# Patient Record
Sex: Female | Born: 1945
Health system: Southern US, Community
[De-identification: ages and names within clinical notes are randomized; demographics above are authoritative.]

## PROBLEM LIST (undated history)

## (undated) DIAGNOSIS — J42 Unspecified chronic bronchitis: Secondary | ICD-10-CM

## (undated) DIAGNOSIS — F32A Depression, unspecified: Secondary | ICD-10-CM

## (undated) DIAGNOSIS — N189 Chronic kidney disease, unspecified: Secondary | ICD-10-CM

## (undated) DIAGNOSIS — E039 Hypothyroidism, unspecified: Secondary | ICD-10-CM

## (undated) DIAGNOSIS — E119 Type 2 diabetes mellitus without complications: Secondary | ICD-10-CM

## (undated) DIAGNOSIS — I499 Cardiac arrhythmia, unspecified: Secondary | ICD-10-CM

## (undated) DIAGNOSIS — E785 Hyperlipidemia, unspecified: Secondary | ICD-10-CM

## (undated) DIAGNOSIS — M1990 Unspecified osteoarthritis, unspecified site: Secondary | ICD-10-CM

## (undated) DIAGNOSIS — I739 Peripheral vascular disease, unspecified: Secondary | ICD-10-CM

## (undated) DIAGNOSIS — R0602 Shortness of breath: Secondary | ICD-10-CM

## (undated) DIAGNOSIS — L0100 Impetigo, unspecified: Secondary | ICD-10-CM

## (undated) DIAGNOSIS — J45909 Unspecified asthma, uncomplicated: Secondary | ICD-10-CM

## (undated) DIAGNOSIS — G709 Myoneural disorder, unspecified: Secondary | ICD-10-CM

## (undated) DIAGNOSIS — E079 Disorder of thyroid, unspecified: Secondary | ICD-10-CM

## (undated) DIAGNOSIS — M810 Age-related osteoporosis without current pathological fracture: Secondary | ICD-10-CM

## (undated) DIAGNOSIS — T4145XA Adverse effect of unspecified anesthetic, initial encounter: Secondary | ICD-10-CM

## (undated) DIAGNOSIS — I4891 Unspecified atrial fibrillation: Secondary | ICD-10-CM

## (undated) DIAGNOSIS — I10 Essential (primary) hypertension: Secondary | ICD-10-CM

## (undated) DIAGNOSIS — D649 Anemia, unspecified: Secondary | ICD-10-CM

## (undated) DIAGNOSIS — J449 Chronic obstructive pulmonary disease, unspecified: Secondary | ICD-10-CM

## (undated) HISTORY — DX: Age-related osteoporosis without current pathological fracture: M81.0

## (undated) HISTORY — DX: Essential (primary) hypertension: I10

## (undated) HISTORY — PX: OTHER SURGICAL HISTORY: SHX169

## (undated) HISTORY — DX: Peripheral vascular disease, unspecified: I73.9

## (undated) HISTORY — DX: Unspecified asthma, uncomplicated: J45.909

## (undated) HISTORY — PX: CHOLECYSTECTOMY: SHX55

## (undated) HISTORY — DX: Type 2 diabetes mellitus without complications: E11.9

## (undated) HISTORY — DX: Disorder of thyroid, unspecified: E07.9

## (undated) HISTORY — DX: Hyperlipidemia, unspecified: E78.5

## (undated) HISTORY — PX: HIP FRACTURE SURGERY: SHX118

## (undated) HISTORY — PX: CATARACT EXTRACTION, BILATERAL: SHX1313

---

## 1998-06-27 DIAGNOSIS — T8859XA Other complications of anesthesia, initial encounter: Secondary | ICD-10-CM

## 1998-06-27 HISTORY — DX: Other complications of anesthesia, initial encounter: T88.59XA

## 1998-07-13 ENCOUNTER — Other Ambulatory Visit: Admission: RE | Admit: 1998-07-13 | Discharge: 1998-07-13 | Payer: Self-pay | Admitting: *Deleted

## 1999-07-15 ENCOUNTER — Other Ambulatory Visit: Admission: RE | Admit: 1999-07-15 | Discharge: 1999-07-15 | Payer: Self-pay | Admitting: *Deleted

## 2000-03-31 ENCOUNTER — Ambulatory Visit (HOSPITAL_COMMUNITY): Admission: RE | Admit: 2000-03-31 | Discharge: 2000-03-31 | Payer: Self-pay | Admitting: *Deleted

## 2003-01-07 ENCOUNTER — Encounter: Payer: Self-pay | Admitting: Emergency Medicine

## 2003-01-07 ENCOUNTER — Emergency Department (HOSPITAL_COMMUNITY): Admission: EM | Admit: 2003-01-07 | Discharge: 2003-01-07 | Payer: Self-pay | Admitting: Emergency Medicine

## 2003-12-03 ENCOUNTER — Inpatient Hospital Stay (HOSPITAL_COMMUNITY): Admission: EM | Admit: 2003-12-03 | Discharge: 2003-12-11 | Payer: Self-pay | Admitting: Emergency Medicine

## 2006-08-14 ENCOUNTER — Emergency Department (HOSPITAL_COMMUNITY): Admission: EM | Admit: 2006-08-14 | Discharge: 2006-08-14 | Payer: Self-pay | Admitting: Family Medicine

## 2007-08-26 ENCOUNTER — Emergency Department (HOSPITAL_COMMUNITY): Admission: EM | Admit: 2007-08-26 | Discharge: 2007-08-26 | Payer: Self-pay | Admitting: Family Medicine

## 2008-06-05 ENCOUNTER — Other Ambulatory Visit: Admission: RE | Admit: 2008-06-05 | Discharge: 2008-06-05 | Payer: Self-pay | Admitting: Internal Medicine

## 2008-06-10 ENCOUNTER — Encounter: Admission: RE | Admit: 2008-06-10 | Discharge: 2008-06-10 | Payer: Self-pay | Admitting: Internal Medicine

## 2010-01-05 ENCOUNTER — Encounter: Admission: RE | Admit: 2010-01-05 | Discharge: 2010-01-05 | Payer: Self-pay | Admitting: Internal Medicine

## 2010-01-29 ENCOUNTER — Emergency Department (HOSPITAL_COMMUNITY): Admission: EM | Admit: 2010-01-29 | Discharge: 2010-01-29 | Payer: Self-pay | Admitting: Emergency Medicine

## 2010-07-18 ENCOUNTER — Encounter: Payer: Self-pay | Admitting: Internal Medicine

## 2010-09-13 ENCOUNTER — Other Ambulatory Visit: Payer: Self-pay | Admitting: Internal Medicine

## 2010-09-13 DIAGNOSIS — Z1231 Encounter for screening mammogram for malignant neoplasm of breast: Secondary | ICD-10-CM

## 2010-09-16 ENCOUNTER — Ambulatory Visit
Admission: RE | Admit: 2010-09-16 | Discharge: 2010-09-16 | Disposition: A | Discharge status: Home / Self Care | Payer: Medicare Other | Source: Ambulatory Visit | Attending: Internal Medicine | Admitting: Internal Medicine

## 2010-09-16 DIAGNOSIS — Z1231 Encounter for screening mammogram for malignant neoplasm of breast: Secondary | ICD-10-CM

## 2010-11-12 NOTE — Discharge Summary (Signed)
NAME:  Ariel Cobb, Ariel Cobb                        ACCOUNT NO.:  [id number]   MEDICAL RECORD NO.:  [id number]                   PATIENT TYPE:  INP   LOCATION:  5012                                 FACILITY:  [facility]   PHYSICIAN:  Vania Rea. Supple, M.D.               DATE OF BIRTH:  12/01/1945   DATE OF ADMISSION:  12/03/2003  DATE OF DISCHARGE:  12/11/2003                                 DISCHARGE SUMMARY   ADMISSION DIAGNOSES:  1. Displaced femoral neck fracture.  2. Non-insulin-dependent diabetes.  3. Hypertension.  4. Tobacco dependency.   DISCHARGE DIAGNOSES:  1. Displaced femoral neck fracture.  2. Non-insulin-dependent diabetes mellitus.  3. Hypertension.  4. Tobacco dependency.  5. Status post left hip hemiarthroplasty.   OPERATION:  Long stem S-ROM press-fit bipolar hemiarthroplasty.   SURGEON:  Vania Rea. Supple, M.D.   ASSISTANTSMadlyn Frankel. Charlann Boxer, M.D., and Lucita Lora. Shuford, P.A.-C.   ANESTHESIA:  General.   BRIEF HISTORY:  [name] is a 65 year old female who slipped and fell in  her driveway on some slippery rocks.  She denied syncope or chest pain.  She  denied any loss of consciousness.  She had the sudden onset of left hip  pain, was unable to ambulate.  She was brought to [facility]  Emergency Room where x-rays demonstrated a displaced left hip fracture about  the femoral neck.  Operative intervention was discussed, the risks and  benefits thereof, at length, and she wished to proceed.   HOSPITAL COURSE:  The patient was admitted, underwent the above-named  procedure.  Intraoperatively, she was found to have a cortical violation  after the initial implantation of a cemented stem.  Therefore, the cement  was removed and placement of a long stem press-fit prosthesis was placed.  She tolerated this patient well.  All appropriate IV antibiotics and  infusions were utilized.  She did experience a blood loss anemia and was  transfused with good result.   She had a mild hyponatremia postoperatively  which responded to fluid management.  Postoperatively, she remained  hemodynamically and medically stable.  She had the expected amount of pain,  was weaned off IV analgesics to p.o.'s.  The rehab consult was ordered as  she was initially slow to progress.  However, after pain control was  managed, progressive mobilization was tolerated and, by December 11, 2003, she  had met all discharge goals.  At this time, she was afebrile.  Neurovascularly, she was intact, her dressing was dry, and she was  tolerating short-distance ambulation and transfers. At this time, she was  felt medically and orthopedically stable and discharged to home to follow up  in an outpatient setting.   LABORATORY DATA:  Admission EKG showed normal sinus rhythm.  Initial hip  films showed a femoral neck fracture on the left.  Initial chest film showed  possible pulmonary hyperinflation but no active disease.  Intraoperative  films taken demonstrated cortical violation.  Postoperative films showed a  long hip prosthesis and cerclage wire placement.  On June 11, repeat chest  showed streaky bibasilar atelectasis.   Admission labs shows admission hemoglobin 11.5.  She was transfused  intraoperatively and remained stable postoperatively between 10.9 and 8.8.  Coagulation times were monitored by pharmacy on DVT prophylaxis with  Coumadin.  Chemistries showed an admission sodium 135, postoperatively down  to 127, and prior to discharge, 132.  Glucoses ranged between 151-303.  Blood type O negative.   CONDITION ON DISCHARGE:  Stable and improved.   DISCHARGE MEDICATIONS AND PLANS:  The patient is being discharged to home.  She will follow up with [place] in two weeks, call for a time.  She is discharged  with the following prescriptions:  Coumadin for deep venous thrombosis and  pulmonary embolus prophylaxis, Percocet, Robaxin, and Restoril.  May shower  at this time.  She is  touch-down weightbearing, total hip precautions.  Would like to follow up in 7-10 days for repeat x-rays in our office.  May  resume regular diet at home.   CONDITION ON DISCHARGE:  Stable, improved.      Tracy A. Shuford, P.A.-C.                 Vania Rea. Supple, M.D.    TAS/MEDQ  D:  01/16/2004  T:  01/17/2004  Job:  [id number]

## 2010-11-12 NOTE — Op Note (Signed)
NAME:  Ariel Cobb, Ariel Cobb                        ACCOUNT NO.:  [id number]   MEDICAL RECORD NO.:  [id number]                   PATIENT TYPE:  INP   LOCATION:  5504                                 FACILITY:  [facility]   PHYSICIAN:  Vania Rea. Supple, M.D.               DATE OF BIRTH:  [birth year]   DATE OF PROCEDURE:  12/04/2003  DATE OF DISCHARGE:                                 OPERATIVE REPORT   PREOPERATIVE DIAGNOSIS:  Displaced left femoral neck fracture.   POSTOPERATIVE DIAGNOSIS:  Displaced left femoral neck fracture.   OPERATION PERFORMED:  Press-fit SROM bipolar hemiarthroplasty utilizing a  long stemmed femoral implant 225 mm length and a 46 mm outside diameter  bipolar head, 28 mm inside head with +0 neck length and a 20 V large  proximal sleeve.   SURGEON:  Vania Rea. Supple, M.D.   ASSISTANT:  1. Madlyn Frankel. Charlann Boxer, M.D.  2. French Ana A. Shuford, P.A.-C.   ANESTHESIA:  General endotracheal.   ESTIMATED BLOOD LOSS:  500 mL.   DRAINS:  None.   FLUIDS REPLACED:  2 L crystalloid, 1 unit packed cells.   INDICATIONS FOR PROCEDURE:  Ariel Cobb is a 65 year old female who fell at  home yesterday sustaining a displaced left femoral neck fracture.  She has  been admitted to the hospital and is brought to the operating room at this  time for planned left hip hemiarthroplasty.  Preoperatively, I discussed  with Ariel Cobb and her family members treatment options as well as risks  versus benefits thereof.  Possible surgical complications of bleeding,  infection, neurovascular injury, DVT, PE, persistent pain, loss of motion,  failure of the implant and possible need for revision surgery were reviewed.  She understands and accepts and agreed with our planned procedure.   DESCRIPTION OF PROCEDURE:  After undergoing routine preop evaluation, the  patient received prophylactic antibiotics.  Brought to the operating room,  placed supine on the operating table and given induction of general  endotracheal anesthesia.  Turned to the right lateral decubitus position on  the bed and appropriately stabilized with the Stullberg hip stabilizer.  The  left hip girdle region was then sterilely prepped and draped in standard  fashion.  She did receive prophylactic antibiotics which was then  supplemented with an additional dose towards the end of the case.  Once the  left hip girdle region was sterilely prepped and draped in standard fashion,  we made a standard posterior approach to the hip through a 15 cm incision  centered over the greater trochanter.  Skin flaps were elevated and  electrocautery was used for hemostasis.  The deep fascia was then divided in  line with the skin incision and the gluteus maximus was split proximally.  Charnley retractor was placed.  The short external rotators including the  piriformis were divided away from their attachment to the greater trochanter  and tagged and reflected posteriorly  and the gluteus medius and minimus were  reflected anteriorly.  The capsule was identified, T'd and tagged.  This  allowed visualization of the fracture site.  A corkscrew was then used  to  remove the femoral head.  It was measured at approximately 47 mm.  The  acetabulum was inspected and the articular surfaces were in good condition.  Attention was then directed to the proximal femur where residual soft tissue  at the piriformis fossa was removed with electrocautery.  Guide pin was then  directed into the femoral shaft through the piriformis fossa followed by a  step drill.  Canal finder was passed.  Sequential reamings were performed up  to size 8.  We then performed sequential broachings after we had utilized a  trochanteric reamer.  Broaches were performed up to size 8 and off of this  we used a calcar planer to smooth the contour of the femoral neck cut.  I  should mention that we did make a femoral neck cut with an oscillating saw  off the size 8 reamer with the  appropriate cutting guide.  After this was  completed, then we measured the distal cement plug and placed this into  position at the proper depth.  Cement was then mixed at the back table and  in the process of introducing the cement, approximately a third of the  cement was introduced and then technical difficulties were encountered in  further delivering the cement into the canal.  At this point we elected to  remove the already placed cement and had carefully inspected the canal and  it appeared as though all the cement had been removed.  Another batch of  cement was mixed and this was then introduced and the implant was then  placed in the canal but it was clear that the implant would not completely  seat and it appeared as though there had been some residual cement  preventing complete delivery of the prosthesis.  In the process of  delivering the prosthesis and impacting it, an audible crack was  appreciated.  With this in mind the implant was then retrieved.  We again  removed all obvious cement and on further inspection, it was clear that the  distal cement plug had retained some cement and was preventing the implant  from properly seating.  At this point attempts were initiated to retrieve  the retained cement in the canal to allow complete implantation of the  prosthesis.  In the process of retrieving the retained cement, a further  violation of the cortical bone was developed posterolaterally.  With this in  mind, it was clear that removing the cement simply through the initial  incision would be technically difficult if not impossible and we elected to  extend the incision distally down the lateral shaft of the femur.  This was  performed with sharp dissection with tensor fascia lata divided along the  skin margin, along the direction of the skin incision and then the vastus  lateralis fascia was split and the vastus lateralis musculature was then divided away from the fascia  and reflected anteriorly allowing complete  exposure of the lateral cortex of the femur extending approximately 25 cm  from the femoral neck cut.  This allowed visualization of the cortical  violation zone,  through this window, we were able to use an osteotome to  remove retained cement from within the medullary canal.  In addition, there  was a small longitudinal split of  the cortex, extending somewhat distally  from the zone of cortical violation and we did utilize 18 gauge stainless  steel wire doubled over and placed around the femur in cerclage fashion to  limit hoop stresses on the femur as we were cleaning the canal and  performing additional reamings.  Ultimately all of the cement was removed  successfully and the canal was then pulsatilely lavaged.  At this point we  switched from the [facility] system to the [place] system such that we could  utilize a long stem SROM to bypass the cortical defect.  Sequential reamings  were performed up to 18 mm.  Trial implants were then placed and the 225 cm  straight long stem nicely passed the cortical window providing at least 4 cm  of distal purchase.  Proper version was established proximally and the 48,  47 and 46 mm heads were then once again trialed and then at this point with  the Depuy system, the 46 mm head had better stability and fit.  Trial  reduction was performed showing clinically equal leg lengths.  At this point  a final irrigation of the canal was then achieved.  We put a total of three  cerclage wires around the femur, one proximal and two distal to the cortical  window.  The final implant was then placed beginning with the proximal  sleeve for the calcar and then the long stem plant which was impacted into  position.  The final head construct was then placed into position and the  hip was reduced and taken through a range of motion showing good soft tissue  balance and clinically equal leg lengths and good stability.  At this  point  the wound was then copiously irrigated.  Hemostasis was obtained.  The  fascia over the vastus lateralis was closed with a running #1 Vicryl.  Interrupted figure-of-eight #1 Vicryl sutures were used to close the tensor  fascia lata.  The capsule was closed  with a #1 Ethibond and the short  external rotators and piriformis were then repaired with a greater  trochanter with a #1 Ethibond.  Deep fascial closure was then completed with  a #1 Vicryl and 2-0 Vicryl was used in the  subcutaneous layer and intracuticular 3-0 Monocryl used to close the skin  followed by Steri-Strips.  Bulky dry dressing was applied.  The patient was  then rolled supine, extubated and taken to recovery room where she was found  to be stable and neurovascularly intact in the left lower extremity.                                               Vania Rea. Supple, M.D.    KMS/MEDQ  D:  12/04/2003  T:  12/05/2003  Job:  [id number]

## 2010-11-12 NOTE — Procedures (Signed)
[facility]  Patient:    JEYDI, KLINGEL                     MRN: [id number] Proc. Date: 03/31/00 Adm. Date:  [id number] Attending:  Sabino Gasser                           Procedure Report  PROCEDURE:  Colonoscopy.  INDICATION FOR PROCEDURE:  Hemoccult positivity.  ANESTHESIA:  Demerol 50 mg, Versed 7 mg.  DESCRIPTION OF PROCEDURE:  With the patient mildly sedated in the left lateral decubitus position, the Olympus videoscopic colonoscope was inserted in the rectum and passed under direct vision to the cecum. The cecum was identified by the ileocecal valve and appendiceal orifice both of which were photographed. From this point, the colonoscope was slowly withdrawn taking circumferential views of the entire colonic mucosa, stopping only in the rectum which appeared normal in direct view and showed small internal hemorrhoids on retroflexed view. The endoscope was straightened and withdrawn. The patients vital signs and pulse oximeter remained stable. The patient tolerated the procedure well without apparent complications.  FINDINGS:  Small internal hemorrhoids otherwise unremarkable colonoscopic examination to the cecum.  PLAN:  Repeat examination possibly in 5-10 years. DD:  03/31/00 TD:  03/31/00 Job: [id number]

## 2011-08-05 DIAGNOSIS — B351 Tinea unguium: Secondary | ICD-10-CM | POA: Diagnosis not present

## 2011-08-05 DIAGNOSIS — M79609 Pain in unspecified limb: Secondary | ICD-10-CM | POA: Diagnosis not present

## 2011-08-05 DIAGNOSIS — S90129A Contusion of unspecified lesser toe(s) without damage to nail, initial encounter: Secondary | ICD-10-CM | POA: Diagnosis not present

## 2011-09-14 DIAGNOSIS — N182 Chronic kidney disease, stage 2 (mild): Secondary | ICD-10-CM | POA: Diagnosis not present

## 2011-09-14 DIAGNOSIS — Z0000 Encounter for general adult medical examination without abnormal findings: Secondary | ICD-10-CM | POA: Diagnosis not present

## 2011-09-14 DIAGNOSIS — E1129 Type 2 diabetes mellitus with other diabetic kidney complication: Secondary | ICD-10-CM | POA: Diagnosis not present

## 2011-09-14 DIAGNOSIS — F17200 Nicotine dependence, unspecified, uncomplicated: Secondary | ICD-10-CM | POA: Diagnosis not present

## 2011-09-14 DIAGNOSIS — I10 Essential (primary) hypertension: Secondary | ICD-10-CM | POA: Diagnosis not present

## 2011-09-14 DIAGNOSIS — E782 Mixed hyperlipidemia: Secondary | ICD-10-CM | POA: Diagnosis not present

## 2011-09-27 ENCOUNTER — Other Ambulatory Visit: Payer: Self-pay | Admitting: Internal Medicine

## 2011-09-27 DIAGNOSIS — Z1231 Encounter for screening mammogram for malignant neoplasm of breast: Secondary | ICD-10-CM

## 2011-10-05 ENCOUNTER — Ambulatory Visit
Admission: RE | Admit: 2011-10-05 | Discharge: 2011-10-05 | Disposition: A | Discharge status: Home / Self Care | Payer: Medicare Other | Source: Ambulatory Visit | Attending: Internal Medicine | Admitting: Internal Medicine

## 2011-10-05 DIAGNOSIS — Z1231 Encounter for screening mammogram for malignant neoplasm of breast: Secondary | ICD-10-CM | POA: Diagnosis not present

## 2011-12-21 DIAGNOSIS — N200 Calculus of kidney: Secondary | ICD-10-CM | POA: Diagnosis not present

## 2011-12-21 DIAGNOSIS — N401 Enlarged prostate with lower urinary tract symptoms: Secondary | ICD-10-CM | POA: Diagnosis not present

## 2012-01-10 DIAGNOSIS — F17200 Nicotine dependence, unspecified, uncomplicated: Secondary | ICD-10-CM | POA: Diagnosis not present

## 2012-01-10 DIAGNOSIS — E1129 Type 2 diabetes mellitus with other diabetic kidney complication: Secondary | ICD-10-CM | POA: Diagnosis not present

## 2012-01-10 DIAGNOSIS — N182 Chronic kidney disease, stage 2 (mild): Secondary | ICD-10-CM | POA: Diagnosis not present

## 2012-01-10 DIAGNOSIS — R209 Unspecified disturbances of skin sensation: Secondary | ICD-10-CM | POA: Diagnosis not present

## 2012-01-10 DIAGNOSIS — I10 Essential (primary) hypertension: Secondary | ICD-10-CM | POA: Diagnosis not present

## 2012-01-10 DIAGNOSIS — J309 Allergic rhinitis, unspecified: Secondary | ICD-10-CM | POA: Diagnosis not present

## 2012-01-10 DIAGNOSIS — E782 Mixed hyperlipidemia: Secondary | ICD-10-CM | POA: Diagnosis not present

## 2012-01-10 DIAGNOSIS — E039 Hypothyroidism, unspecified: Secondary | ICD-10-CM | POA: Diagnosis not present

## 2012-03-20 DIAGNOSIS — Z23 Encounter for immunization: Secondary | ICD-10-CM | POA: Diagnosis not present

## 2012-05-01 DIAGNOSIS — E782 Mixed hyperlipidemia: Secondary | ICD-10-CM | POA: Diagnosis not present

## 2012-05-01 DIAGNOSIS — E1129 Type 2 diabetes mellitus with other diabetic kidney complication: Secondary | ICD-10-CM | POA: Diagnosis not present

## 2012-05-01 DIAGNOSIS — E039 Hypothyroidism, unspecified: Secondary | ICD-10-CM | POA: Diagnosis not present

## 2012-05-01 DIAGNOSIS — I10 Essential (primary) hypertension: Secondary | ICD-10-CM | POA: Diagnosis not present

## 2012-05-01 DIAGNOSIS — F17200 Nicotine dependence, unspecified, uncomplicated: Secondary | ICD-10-CM | POA: Diagnosis not present

## 2012-08-29 DIAGNOSIS — E782 Mixed hyperlipidemia: Secondary | ICD-10-CM | POA: Diagnosis not present

## 2013-01-03 DIAGNOSIS — E1129 Type 2 diabetes mellitus with other diabetic kidney complication: Secondary | ICD-10-CM | POA: Diagnosis not present

## 2013-01-03 DIAGNOSIS — I10 Essential (primary) hypertension: Secondary | ICD-10-CM | POA: Diagnosis not present

## 2013-01-03 DIAGNOSIS — F17200 Nicotine dependence, unspecified, uncomplicated: Secondary | ICD-10-CM | POA: Diagnosis not present

## 2013-01-03 DIAGNOSIS — J441 Chronic obstructive pulmonary disease with (acute) exacerbation: Secondary | ICD-10-CM | POA: Diagnosis not present

## 2013-01-03 DIAGNOSIS — E782 Mixed hyperlipidemia: Secondary | ICD-10-CM | POA: Diagnosis not present

## 2013-01-15 DIAGNOSIS — J441 Chronic obstructive pulmonary disease with (acute) exacerbation: Secondary | ICD-10-CM | POA: Diagnosis not present

## 2013-01-15 DIAGNOSIS — F17200 Nicotine dependence, unspecified, uncomplicated: Secondary | ICD-10-CM | POA: Diagnosis not present

## 2013-03-16 DIAGNOSIS — Z23 Encounter for immunization: Secondary | ICD-10-CM | POA: Diagnosis not present

## 2013-04-03 ENCOUNTER — Encounter: Payer: Self-pay | Admitting: Podiatry

## 2013-04-03 ENCOUNTER — Ambulatory Visit (INDEPENDENT_AMBULATORY_CARE_PROVIDER_SITE_OTHER): Payer: Medicare Other | Admitting: Podiatry

## 2013-04-03 ENCOUNTER — Ambulatory Visit (INDEPENDENT_AMBULATORY_CARE_PROVIDER_SITE_OTHER): Payer: Medicare Other

## 2013-04-03 VITALS — BP 163/68 | HR 76 | Resp 20 | Ht 66.0 in | Wt 140.0 lb

## 2013-04-03 DIAGNOSIS — I739 Peripheral vascular disease, unspecified: Secondary | ICD-10-CM

## 2013-04-03 DIAGNOSIS — R52 Pain, unspecified: Secondary | ICD-10-CM

## 2013-04-03 MED ORDER — TRIAMCINOLONE ACETONIDE 10 MG/ML IJ SUSP
5.0000 mg | Freq: Once | INTRAMUSCULAR | Status: AC
  Administered 2013-04-03: 5 mg via INTRA_ARTICULAR

## 2013-04-03 NOTE — Progress Notes (Signed)
  Subjective:    Patient ID: Ariel Cobb, female    DOB: Aug 15, 1945, 67 y.o.   MRN: [id number]  HPI    Review of Systems  Constitutional: Negative.   HENT: Negative.   Eyes: Positive for visual disturbance.  Respiratory: Positive for wheezing.   Gastrointestinal: Negative.   Endocrine: Negative.   Genitourinary: Negative.   Musculoskeletal: Positive for arthralgias.  Skin: Negative.   Hematological: Bruises/bleeds easily.       Objective:   Physical Exam        Assessment & Plan:

## 2013-04-03 NOTE — Patient Instructions (Signed)
You will be contacted for the test. If you do not hear within 5 days please call us back

## 2013-04-04 NOTE — Progress Notes (Signed)
Subjective:     Patient ID: Ariel Cobb, female   DOB: 1945-09-06, 67 y.o.   MRN: [id number]  Foot Pain   patient complains of pain in both feet of long-term duration. States that is especially painful under the first metatarsal head. She also complains of cramps in her feet and the back of her legs. She is able to walk but not long distances.   Review of Systems  All other systems reviewed and are negative.       Objective:   Physical Exam  Nursing note and vitals reviewed. Constitutional: She is oriented to person, place, and time. She appears well-developed and well-nourished.  Neurological: She is alert and oriented to person, place, and time. She has normal reflexes.   I did note that pulses are diminished PT +04 Ryka 04 left DP 04 right was one over four left her skin is reduced as far as hair growth. I also noted tightness in her skin. She has pain underneath her first metatarsals bilateral.    Assessment:     Possible circulatory disease bilateral with signs of claudication. Pain in the first metatarsal heads bilateral with inflammatory fluid buildup.    Plan:     H&P performed. X-rays reviewed. Careful steroidal injections of the first MPJ plantar bilateral 2 mg Kenalog 3 mg Xylocaine. Vascular studies ordered.

## 2013-04-09 ENCOUNTER — Encounter (HOSPITAL_COMMUNITY): Payer: Self-pay | Admitting: *Deleted

## 2013-04-09 ENCOUNTER — Telehealth (HOSPITAL_COMMUNITY): Payer: Self-pay | Admitting: *Deleted

## 2013-04-25 ENCOUNTER — Ambulatory Visit (HOSPITAL_COMMUNITY)
Admission: RE | Admit: 2013-04-25 | Discharge: 2013-04-25 | Disposition: A | Discharge status: Home / Self Care | Payer: Medicare Other | Source: Ambulatory Visit | Attending: Podiatry | Admitting: Podiatry

## 2013-04-25 ENCOUNTER — Telehealth: Payer: Self-pay | Admitting: *Deleted

## 2013-04-25 DIAGNOSIS — I70219 Atherosclerosis of native arteries of extremities with intermittent claudication, unspecified extremity: Secondary | ICD-10-CM | POA: Diagnosis not present

## 2013-04-25 DIAGNOSIS — I739 Peripheral vascular disease, unspecified: Secondary | ICD-10-CM | POA: Diagnosis not present

## 2013-04-25 NOTE — Progress Notes (Signed)
Arterial Duplex Lower Ext. Completed. Ariel Cobb, BS, RDMS, RVT  

## 2013-04-25 NOTE — Telephone Encounter (Signed)
[facility] state pt's arterial dopplers were + on the Right side and was schedule with Dr [name] on 05/06/2013.

## 2013-04-26 DIAGNOSIS — M79609 Pain in unspecified limb: Secondary | ICD-10-CM | POA: Diagnosis not present

## 2013-04-28 NOTE — Telephone Encounter (Signed)
Thank you :)

## 2013-05-06 ENCOUNTER — Encounter (HOSPITAL_COMMUNITY): Payer: Self-pay | Admitting: Cardiovascular Disease

## 2013-05-06 ENCOUNTER — Encounter: Payer: Self-pay | Admitting: Cardiovascular Disease

## 2013-05-06 ENCOUNTER — Ambulatory Visit (INDEPENDENT_AMBULATORY_CARE_PROVIDER_SITE_OTHER): Payer: Medicare Other | Admitting: Cardiovascular Disease

## 2013-05-06 VITALS — BP 140/60 | HR 80 | Ht 66.0 in | Wt 140.7 lb

## 2013-05-06 DIAGNOSIS — I739 Peripheral vascular disease, unspecified: Secondary | ICD-10-CM

## 2013-05-06 DIAGNOSIS — E782 Mixed hyperlipidemia: Secondary | ICD-10-CM | POA: Diagnosis not present

## 2013-05-06 DIAGNOSIS — Z01818 Encounter for other preprocedural examination: Secondary | ICD-10-CM

## 2013-05-06 DIAGNOSIS — Z01810 Encounter for preprocedural cardiovascular examination: Secondary | ICD-10-CM

## 2013-05-06 DIAGNOSIS — F17200 Nicotine dependence, unspecified, uncomplicated: Secondary | ICD-10-CM

## 2013-05-06 DIAGNOSIS — E1129 Type 2 diabetes mellitus with other diabetic kidney complication: Secondary | ICD-10-CM | POA: Diagnosis not present

## 2013-05-06 DIAGNOSIS — I10 Essential (primary) hypertension: Secondary | ICD-10-CM | POA: Insufficient documentation

## 2013-05-06 DIAGNOSIS — Z794 Long term (current) use of insulin: Secondary | ICD-10-CM | POA: Insufficient documentation

## 2013-05-06 DIAGNOSIS — Z79899 Other long term (current) drug therapy: Secondary | ICD-10-CM | POA: Diagnosis not present

## 2013-05-06 DIAGNOSIS — R5381 Other malaise: Secondary | ICD-10-CM

## 2013-05-06 DIAGNOSIS — E1169 Type 2 diabetes mellitus with other specified complication: Secondary | ICD-10-CM | POA: Insufficient documentation

## 2013-05-06 DIAGNOSIS — D689 Coagulation defect, unspecified: Secondary | ICD-10-CM | POA: Diagnosis not present

## 2013-05-06 DIAGNOSIS — E039 Hypothyroidism, unspecified: Secondary | ICD-10-CM | POA: Diagnosis not present

## 2013-05-06 DIAGNOSIS — M810 Age-related osteoporosis without current pathological fracture: Secondary | ICD-10-CM | POA: Diagnosis not present

## 2013-05-06 DIAGNOSIS — Z720 Tobacco use: Secondary | ICD-10-CM

## 2013-05-06 DIAGNOSIS — E785 Hyperlipidemia, unspecified: Secondary | ICD-10-CM | POA: Insufficient documentation

## 2013-05-06 DIAGNOSIS — R5383 Other fatigue: Secondary | ICD-10-CM

## 2013-05-06 NOTE — Progress Notes (Signed)
05/06/2013 Ariel Cobb   1946-04-11  [id number]  Primary Physician Katy Apo, MD Primary Cardiologist: Runell Gess MD Ariel Cobb   HPI:  Incision is a 67 year old thin appearing widowed Caucasian female no children he lives alone. Accompanied by her sister. She was referred by Dr. Cristie Hem at [facility] for evaluation and treatment of claudication. Her primary care physician is Dr. Rebecka Apley at [facility]. Factors include a 50-pack-year history tobacco abuse currently smoking one pack per day, 2 hypertension, diabetes and hyperlipidemia. Her father died suddenly of microinfarction at age 44. She has never had a heart attack or stroke patient is compared to sleep denies chest pain. She complains of right calf claudication with recent arterial Dopplers performed in our office on 04/25/13 revealing a right ABI 0.93 with a high-frequency signal in the right popliteal artery.   Current Outpatient Prescriptions  Medication Sig Dispense Refill  . aspirin 81 MG tablet Take 81 mg by mouth daily.      [name] atorvastatin (LIPITOR) 20 MG tablet Take 20 mg by mouth daily at 6 PM.       . budesonide-formoterol (SYMBICORT) 80-4.5 MCG/ACT inhaler Inhale 2 puffs into the lungs 2 (two) times daily.      . Calcium Carb-Cholecalciferol (CALCIUM 600/VITAMIN D3) 600-800 MG-UNIT TABS Take 1 each by mouth daily.      [name] GLIPIZIDE XL 10 MG 24 hr tablet Take 10 mg by mouth daily.       . insulin glargine (LANTUS) 100 UNIT/ML injection Inject 7 Units into the skin at bedtime.      . metFORMIN (GLUCOPHAGE) 1000 MG tablet Take 1,000 mg by mouth 2 (two) times daily.       . pioglitazone (ACTOS) 45 MG tablet Take 45 mg by mouth daily.       [name] PROAIR HFA 108 (90 BASE) MCG/ACT inhaler       . ramipril (ALTACE) 10 MG capsule Take 10 mg by mouth 2 (two) times daily.       [name] SYNTHROID 112 MCG tablet Take 112 mcg by mouth daily before breakfast.       . triamterene-hydrochlorothiazide  (MAXZIDE-25) 37.5-25 MG per tablet Take 1 tablet by mouth daily.        No current facility-administered medications for this visit.    Allergies  Allergen Reactions  . Adhesive [Tape] Itching  . Amoxicillin Hives, Itching and Swelling  . Latex Itching    History   Social History  . Marital Status: Widowed    Spouse Name: N/A    Number of Children: N/A  . Years of Education: N/A   Occupational History  . Not on file.   Social History Main Topics  . Smoking status: Current Every Day Smoker -- 0.75 packs/day    Types: Cigarettes  . Smokeless tobacco: Not on file  . Alcohol Use: No  . Drug Use: No  . Sexual Activity: Not on file   Other Topics Concern  . Not on file   Social History Narrative  . No narrative on file     Review of Systems: General: negative for chills, fever, night sweats or weight changes.  Cardiovascular: negative for chest pain, dyspnea on exertion, edema, orthopnea, palpitations, paroxysmal nocturnal dyspnea or shortness of breath Dermatological: negative for rash Respiratory: negative for cough or wheezing Urologic: negative for hematuria Abdominal: negative for nausea, vomiting, diarrhea, bright red blood per rectum, melena, or hematemesis Neurologic: negative for visual changes, syncope,  or dizziness All other systems reviewed and are otherwise negative except as noted above.    Blood pressure 140/60, pulse 80, height 5\' 6"  (1.676 m), weight 140 lb 11.2 oz (63.821 kg).  General appearance: alert and no distress Neck: no adenopathy, no carotid bruit, no JVD, supple, symmetrical, trachea midline and thyroid not enlarged, symmetric, no tenderness/mass/nodules Lungs: clear to auscultation bilaterally Heart: regular rate and rhythm, S1, S2 normal, no murmur, click, rub or gallop Abdomen: soft, non-tender; bowel sounds normal; no masses,  no organomegaly Extremities: extremities normal, atraumatic, no cyanosis or edema Pulses: 2+ and  symmetric 1+ right and 2+ left pedal pulses  EKG not performed today  ASSESSMENT AND PLAN:   Claudication 67 year old female with a history of claudication and recent arterial Dopplers performed in office 04/25/13 revealing a right ankle brachial index of 0.93 with a with a high-frequency signal in her right popliteal. Based on this am going to arrange for her to undergo abdominal aortography with bilateral runoff potential endovascular therapy for lifestyle limiting claudication.  Essential hypertension Under good control and her medications  Hyperlipidemia On statin therapy followed by her PCP      Runell Gess MD [facility] 05/06/2013 3:55 PM

## 2013-05-06 NOTE — Assessment & Plan Note (Signed)
On statin therapy followed by her PCP 

## 2013-05-06 NOTE — Assessment & Plan Note (Signed)
67 year old female with a history of claudication and recent arterial Dopplers performed in office 04/25/13 revealing a right ankle brachial index of 0.93 with a with a high-frequency signal in her right popliteal. Based on this am going to arrange for her to undergo abdominal aortography with bilateral runoff potential endovascular therapy for lifestyle limiting claudication.

## 2013-05-06 NOTE — Patient Instructions (Signed)
Dr. [name] has ordered a peripheral angiogram to be done at [facility].  This procedure is going to look at the bloodflow in your lower extremities.  If Dr. [name] is able to open up the arteries, you will have to spend one night in the hospital.  If he is not able to open the arteries, you will be able to go home that same day.    After the procedure, you will not be allowed to drive for 3 days or push, pull, or lift anything greater than 10 lbs for one week.    You will be required to have bloodwork and a chest xray prior to your procedure.  Our scheduler will advise you on when these items need to be done.     Reps: Lorin Picket and Delice Bison Left groin    Dr [name] has ordered a lexiscan myoview to be done prior to the angiogram.

## 2013-05-06 NOTE — Assessment & Plan Note (Signed)
Under good control and her medications 

## 2013-05-07 ENCOUNTER — Encounter: Payer: Self-pay | Admitting: Cardiovascular Disease

## 2013-05-14 DIAGNOSIS — M810 Age-related osteoporosis without current pathological fracture: Secondary | ICD-10-CM | POA: Diagnosis not present

## 2013-05-15 ENCOUNTER — Ambulatory Visit (HOSPITAL_COMMUNITY)
Admission: RE | Admit: 2013-05-15 | Discharge: 2013-05-15 | Disposition: A | Discharge status: Home / Self Care | Payer: Medicare Other | Source: Ambulatory Visit | Attending: Cardiovascular Disease | Admitting: Cardiovascular Disease

## 2013-05-15 DIAGNOSIS — Z01810 Encounter for preprocedural cardiovascular examination: Secondary | ICD-10-CM

## 2013-05-15 DIAGNOSIS — I739 Peripheral vascular disease, unspecified: Secondary | ICD-10-CM

## 2013-05-15 DIAGNOSIS — I2510 Atherosclerotic heart disease of native coronary artery without angina pectoris: Secondary | ICD-10-CM | POA: Insufficient documentation

## 2013-05-15 MED ORDER — AMINOPHYLLINE 25 MG/ML IV SOLN
75.0000 mg | Freq: Once | INTRAVENOUS | Status: AC
  Administered 2013-05-15: 75 mg via INTRAVENOUS

## 2013-05-15 MED ORDER — REGADENOSON 0.4 MG/5ML IV SOLN
0.4000 mg | Freq: Once | INTRAVENOUS | Status: AC
  Administered 2013-05-15: 0.4 mg via INTRAVENOUS

## 2013-05-15 MED ORDER — TECHNETIUM TC 99M SESTAMIBI GENERIC - CARDIOLITE
10.4000 | Freq: Once | INTRAVENOUS | Status: AC | PRN
  Administered 2013-05-15: 10 via INTRAVENOUS

## 2013-05-15 MED ORDER — TECHNETIUM TC 99M SESTAMIBI GENERIC - CARDIOLITE
30.9000 | Freq: Once | INTRAVENOUS | Status: AC | PRN
  Administered 2013-05-15: 30.9 via INTRAVENOUS

## 2013-05-15 NOTE — Procedures (Addendum)
[facility] [address] [place] [address] [place] [address] [phone]  Cardiology Nuclear Med Study  Ariel Cobb is a 67 y.o. female     MRN : [id number]     DOB: 15-Nov-1945  Procedure Date: 05/15/2013  Nuclear Med Background Indication for Stress Test:  Surgical Clearance History:  Asthma Cardiac Risk Factors: Claudication, Family History - CAD, Hypertension, IDDM Type 2, Lipids, PVD and Smoker  Symptoms:  Dizziness, DOE, Fatigue and Palpitations   Nuclear Pre-Procedure Caffeine/Decaff Intake:  1:00am NPO After: 11am   IV Site: R Hand  IV 0.9% NS with Angio Cath:  22g  Chest Size (in):  n/a IV Started by: Emmit Pomfret, RN  Height: 5\' 6"  (1.676 m)  Cup Size: B  BMI:  Body mass index is 22.61 kg/(m^2). Weight:  140 lb (63.504 kg)   Tech Comments:  n/a    Nuclear Med Study 1 or 2 day study: 1 day  Stress Test Type:  Lexiscan  Order Authorizing Provider:  Nanetta Batty, MD   Resting Radionuclide: Technetium 63m Sestamibi  Resting Radionuclide Dose: 10.4 mCi   Stress Radionuclide:  Technetium 67m Sestamibi  Stress Radionuclide Dose: 30.9 mCi           Stress Protocol Rest HR: 61 Stress HR: 81  Rest BP: 468/69 Stress BP: 165/64  Exercise Time (min): n/a METS: n/a   Predicted Max HR: 151 bpm % Max HR: 60.93 bpm Rate Pressure Product: 21308  Dose of Adenosine (mg):  n/a Dose of Lexiscan: 0.4 mg  Dose of Atropine (mg): n/a Dose of Dobutamine: n/a mcg/kg/min (at max HR)  Stress Test Technologist: Esperanza Sheets, CCT Nuclear Technologist: Gonzella Lex, CNMT   Rest Procedure:  Myocardial perfusion imaging was performed at rest 45 minutes following the intravenous administration of Technetium 82m Sestamibi. Stress Procedure:  The patient received IV Lexiscan 0.4 mg over 15-seconds.  Technetium 39m Sestamibi injected at 30-seconds.  There were no significant changes with Lexiscan.  Quantitative spect images were obtained  after a 45 minute delay. Pt developed a headache post Lexiscan infusion. 75 mg IV Aminophylline given. Headache resolved.  Transient Ischemic Dilatation (Normal <1.22):  1.01 Lung/Heart Ratio (Normal <0.45):  0.28 QGS EDV:  69 ml QGS ESV:  20 ml LV Ejection Fraction: 71%  Rest ECG: NSR - Normal EKG  Stress ECG: No significant change from baseline ECG  QPS Raw Data Images:  Normal; no motion artifact; normal heart/lung ratio. Stress Images:  Normal homogeneous uptake in all areas of the myocardium. Rest Images:  Normal homogeneous uptake in all areas of the myocardium. Subtraction (SDS):  No evidence of ischemia.  Impression Exercise Capacity:  Lexiscan with no exercise. BP Response:  Normal blood pressure response. Clinical Symptoms:  No significant symptoms noted. ECG Impression:  No significant ECG changes with Lexiscan. Comparison with Prior Nuclear Study: No previous nuclear study performed  Overall Impression:  Normal stress nuclear study.  LV Wall Motion:  NL LV Function; NL Wall Motion; EF 71%  Chrystie Nose, MD, [facility] Board Certified in Nuclear Cardiology Attending Cardiologist [facility]  Chrystie Nose, MD  05/15/2013 5:43 PM

## 2013-05-20 ENCOUNTER — Encounter: Payer: Self-pay | Admitting: *Deleted

## 2013-05-21 ENCOUNTER — Encounter (HOSPITAL_COMMUNITY): Payer: Self-pay | Admitting: Pharmacy Technician

## 2013-05-29 ENCOUNTER — Ambulatory Visit
Admission: RE | Admit: 2013-05-29 | Discharge: 2013-05-29 | Disposition: A | Discharge status: Home / Self Care | Payer: Medicare Other | Source: Ambulatory Visit | Attending: Cardiovascular Disease | Admitting: Cardiovascular Disease

## 2013-05-29 ENCOUNTER — Telehealth: Payer: Self-pay | Admitting: Cardiovascular Disease

## 2013-05-29 DIAGNOSIS — D689 Coagulation defect, unspecified: Secondary | ICD-10-CM | POA: Diagnosis not present

## 2013-05-29 DIAGNOSIS — Z01818 Encounter for other preprocedural examination: Secondary | ICD-10-CM | POA: Diagnosis not present

## 2013-05-29 DIAGNOSIS — Z79899 Other long term (current) drug therapy: Secondary | ICD-10-CM | POA: Diagnosis not present

## 2013-05-29 DIAGNOSIS — R5381 Other malaise: Secondary | ICD-10-CM | POA: Diagnosis not present

## 2013-05-29 DIAGNOSIS — Z01810 Encounter for preprocedural cardiovascular examination: Secondary | ICD-10-CM

## 2013-05-29 DIAGNOSIS — Z720 Tobacco use: Secondary | ICD-10-CM

## 2013-05-29 DIAGNOSIS — J438 Other emphysema: Secondary | ICD-10-CM | POA: Diagnosis not present

## 2013-05-29 NOTE — Telephone Encounter (Signed)
Informed  Ms Ariel Cobb that her sister need to go to [facility] in the same building as the lab. Imaging has the order for her chest xray. She verbalized understanding and stated she will get in touch with her sister the patient.

## 2013-05-29 NOTE — Telephone Encounter (Signed)
Returned call and pt verified x 2 w/ sister, Steward Drone.  Informed message received and pt is supposed to have labs and X-ray for procedure based on letter for procedure.  Sister stated pt is at the lab now and she needs to catch her to make sure she goes to have the X-Ray done while she is there.

## 2013-05-29 NOTE — Telephone Encounter (Signed)
Calling for her sister Zanyia Silbaugh  Was supposed to get an x ray for upcoming cath  Does not know where to go and does not have an order  Please call

## 2013-05-30 LAB — BASIC METABOLIC PANEL
BUN: 23 mg/dL (ref 6–23)
Calcium: 9.5 mg/dL (ref 8.4–10.5)
Potassium: 5.2 mEq/L (ref 3.5–5.3)

## 2013-05-30 LAB — CBC
Hemoglobin: 9.7 g/dL — ABNORMAL LOW (ref 12.0–15.0)
MCV: 91.9 fL (ref 78.0–100.0)
RBC: 3.22 MIL/uL — ABNORMAL LOW (ref 3.87–5.11)
RDW: 15.4 % (ref 11.5–15.5)
WBC: 7.4 10*3/uL (ref 4.0–10.5)

## 2013-05-30 LAB — PROTIME-INR: INR: 0.92 (ref <1.50)

## 2013-06-03 ENCOUNTER — Encounter: Payer: Self-pay | Admitting: *Deleted

## 2013-06-04 ENCOUNTER — Encounter (HOSPITAL_COMMUNITY)
Admission: RE | Disposition: A | Discharge status: Home / Self Care | Payer: Self-pay | Source: Ambulatory Visit | Attending: Cardiovascular Disease

## 2013-06-04 ENCOUNTER — Ambulatory Visit (HOSPITAL_COMMUNITY)
Admission: RE | Admit: 2013-06-04 | Discharge: 2013-06-04 | Disposition: A | Discharge status: Home / Self Care | Payer: Medicare Other | Source: Ambulatory Visit | Attending: Cardiovascular Disease | Admitting: Cardiovascular Disease

## 2013-06-04 DIAGNOSIS — Z7982 Long term (current) use of aspirin: Secondary | ICD-10-CM | POA: Diagnosis not present

## 2013-06-04 DIAGNOSIS — Z8249 Family history of ischemic heart disease and other diseases of the circulatory system: Secondary | ICD-10-CM | POA: Diagnosis not present

## 2013-06-04 DIAGNOSIS — Z01812 Encounter for preprocedural laboratory examination: Secondary | ICD-10-CM | POA: Diagnosis not present

## 2013-06-04 DIAGNOSIS — Z794 Long term (current) use of insulin: Secondary | ICD-10-CM | POA: Insufficient documentation

## 2013-06-04 DIAGNOSIS — Z79899 Other long term (current) drug therapy: Secondary | ICD-10-CM | POA: Insufficient documentation

## 2013-06-04 DIAGNOSIS — F17200 Nicotine dependence, unspecified, uncomplicated: Secondary | ICD-10-CM | POA: Diagnosis not present

## 2013-06-04 DIAGNOSIS — I739 Peripheral vascular disease, unspecified: Secondary | ICD-10-CM | POA: Diagnosis not present

## 2013-06-04 DIAGNOSIS — I70219 Atherosclerosis of native arteries of extremities with intermittent claudication, unspecified extremity: Secondary | ICD-10-CM | POA: Insufficient documentation

## 2013-06-04 DIAGNOSIS — I10 Essential (primary) hypertension: Secondary | ICD-10-CM | POA: Insufficient documentation

## 2013-06-04 DIAGNOSIS — E785 Hyperlipidemia, unspecified: Secondary | ICD-10-CM | POA: Diagnosis not present

## 2013-06-04 DIAGNOSIS — E119 Type 2 diabetes mellitus without complications: Secondary | ICD-10-CM | POA: Insufficient documentation

## 2013-06-04 DIAGNOSIS — Z01818 Encounter for other preprocedural examination: Secondary | ICD-10-CM

## 2013-06-04 HISTORY — PX: ABDOMINAL AORTAGRAM: SHX5454

## 2013-06-04 HISTORY — PX: LOWER EXTREMITY ANGIOGRAM: SHX5508

## 2013-06-04 LAB — BASIC METABOLIC PANEL
CO2: 24 mEq/L (ref 19–32)
Chloride: 102 mEq/L (ref 96–112)
GFR calc Af Amer: 40 mL/min — ABNORMAL LOW (ref >90)
Potassium: 4.5 mEq/L (ref 3.5–5.1)

## 2013-06-04 LAB — GLUCOSE, CAPILLARY: Glucose-Capillary: 131 mg/dL — ABNORMAL HIGH (ref 70–99)

## 2013-06-04 SURGERY — ANGIOGRAM, LOWER EXTREMITY
Anesthesia: LOCAL

## 2013-06-04 MED ORDER — DIAZEPAM 5 MG PO TABS
5.0000 mg | ORAL_TABLET | ORAL | Status: AC
  Administered 2013-06-04: 5 mg via ORAL
  Filled 2013-06-04: qty 1

## 2013-06-04 MED ORDER — ONDANSETRON HCL 4 MG/2ML IJ SOLN: 4.0000 mg | Freq: Four times a day (QID) | INTRAMUSCULAR | Status: DC | PRN

## 2013-06-04 MED ORDER — SODIUM CHLORIDE 0.9 % IJ SOLN: 3.0000 mL | INTRAMUSCULAR | Status: DC | PRN

## 2013-06-04 MED ORDER — ACETAMINOPHEN 325 MG PO TABS: 650.0000 mg | ORAL_TABLET | ORAL | Status: DC | PRN

## 2013-06-04 MED ORDER — SODIUM CHLORIDE 0.9 % IV SOLN: INTRAVENOUS | Status: AC

## 2013-06-04 MED ORDER — ASPIRIN 81 MG PO CHEW
81.0000 mg | CHEWABLE_TABLET | ORAL | Status: AC
  Administered 2013-06-04: 81 mg via ORAL
  Filled 2013-06-04: qty 1

## 2013-06-04 MED ORDER — SODIUM CHLORIDE 0.9 % IV SOLN
INTRAVENOUS | Status: DC
  Administered 2013-06-04: 08:00:00 via INTRAVENOUS

## 2013-06-04 MED ORDER — LIDOCAINE HCL (PF) 1 % IJ SOLN
INTRAMUSCULAR | Status: AC
  Filled 2013-06-04: qty 30

## 2013-06-04 MED ORDER — HEPARIN (PORCINE) IN NACL 2-0.9 UNIT/ML-% IJ SOLN
INTRAMUSCULAR | Status: AC
  Filled 2013-06-04: qty 1000

## 2013-06-04 NOTE — Interval H&P Note (Signed)
History and Physical Interval Note:  06/04/2013 9:49 AM  Ariel Cobb  has presented today for surgery, with the diagnosis of Claudication  The various methods of treatment have been discussed with the patient and family. After consideration of risks, benefits and other options for treatment, the patient has consented to  Procedure(s): LOWER EXTREMITY ANGIOGRAM (N/A) as a surgical intervention .  The patient's history has been reviewed, patient examined, no change in status, stable for surgery.  I have reviewed the patient's chart and labs.  Questions were answered to the patient's satisfaction.     Runell Gess

## 2013-06-04 NOTE — CV Procedure (Signed)
CLEMMA JOHNSEN is a 67 y.o. female    [id number] LOCATION:  FACILITY: [facility]  PHYSICIAN: Nanetta Batty, M.D. [birth year]   DATE OF PROCEDURE:  06/04/2013  DATE OF DISCHARGE:     PV Angiogram/Intervention    History obtained from chart review.Ms Robeck is a 67 year old thin appearing widowed Caucasian female no children he lives alone. Accompanied by her sister. She was referred by Dr. Cristie Hem at [facility] for evaluation and treatment of claudication. Her primary care physician is Dr. Rebecka Apley at [facility]. Factors include a 50-pack-year history tobacco abuse currently smoking one pack per day, 2 hypertension, diabetes and hyperlipidemia. Her father died suddenly of myocardial infarction at age 74. She has never had a heart attack or stroke patient is compared to sleep denies chest pain. She complains of right calf claudication with recent arterial Dopplers performed in our office on 04/25/13 revealing a right ABI 0.93 with a high-frequency signal in the right popliteal artery.    PROCEDURE DESCRIPTION:   The patient was brought to the second floor [facility] lab in the postabsorptive state. She was premedicated with Valium 5 mg by mouth. Her left groinwas prepped and shaved in usual sterile fashion. Xylocaine 1% was used  for local anesthesia. A 5 French sheath was inserted into the left common femoral artery using standard Seldinger technique.a 5 French pigtail catheter was used for midstream abdominal aortography, distal abdominal aortography, bilateral iliac angiography with bifemoral runoff using bolus chase digital subtraction step table technique. Visipaque dye was used for the entirety of the case. Retrograde aortic pressure was monitored during the case.   HEMODYNAMICS:    AO SYSTOLIC/AO DIASTOLIC: 167/61   Angiographic Data:   1: Abdominal aortogram-the renal arteries was patent. The infrarenal abdominal aorta was free of atherosclerotic  changes echo was fluoroscopically calcified.  2: Left lower extremity-widely patent iliac and SFA. Three-vessel runoff  3 : Right lower extremity-30-40% mid right SFA stenosis that appeared nonobstructive. Who is a 95% calcified eccentric/exophytic plaque in the P2  segment of the popliteal artery with three-vessel runoff  IMPRESSION:Ms. Yanda has a high-grade calcified stenosis in the right popliteal artery that corresponds to the area of abnormality on arterial Doppler study and is responsible for her claudication. This is best treated with diamondback rotational atherectomy, PTA plus or minus stenting. The patient received a total of 100 cc of contrast. Her creatinine clearance was 35 cc per minute. Based on his complaints we will hydrate her, discharge her and bring her back for staged intervention.    Runell Gess MD, [facility] 06/04/2013 10:24 AM

## 2013-06-04 NOTE — H&P (View-Only) (Signed)
   05/06/2013 Ariel Cobb   02/03/1946  [id number]  Primary Physician POLITE,RONALD D, MD Primary Cardiologist: Jose Alleyne J. Roya Gieselman MD FACP,FACC,FAHA, FSCAI   HPI:  Incision is a 67-year-old thin appearing widowed Caucasian female no children he lives alone. Accompanied by her sister. She was referred by Dr. Norman Regal at [facility] for evaluation and treatment of claudication. Her primary care physician is Dr. Ronald Prolite at [facility]. Factors include a 50-pack-year history tobacco abuse currently smoking one pack per day, 2 hypertension, diabetes and hyperlipidemia. Her father died suddenly of microinfarction at age 81. She has never had a heart attack or stroke patient is compared to sleep denies chest pain. She complains of right calf claudication with recent arterial Dopplers performed in our office on 04/25/13 revealing a right ABI 0.93 with a high-frequency signal in the right popliteal artery.   Current Outpatient Prescriptions  Medication Sig Dispense Refill  . aspirin 81 MG tablet Take 81 mg by mouth daily.      . atorvastatin (LIPITOR) 20 MG tablet Take 20 mg by mouth daily at 6 PM.       . budesonide-formoterol (SYMBICORT) 80-4.5 MCG/ACT inhaler Inhale 2 puffs into the lungs 2 (two) times daily.      . Calcium Carb-Cholecalciferol (CALCIUM 600/VITAMIN D3) 600-800 MG-UNIT TABS Take 1 each by mouth daily.      . GLIPIZIDE XL 10 MG 24 hr tablet Take 10 mg by mouth daily.       . insulin glargine (LANTUS) 100 UNIT/ML injection Inject 7 Units into the skin at bedtime.      . metFORMIN (GLUCOPHAGE) 1000 MG tablet Take 1,000 mg by mouth 2 (two) times daily.       . pioglitazone (ACTOS) 45 MG tablet Take 45 mg by mouth daily.       . PROAIR HFA 108 (90 BASE) MCG/ACT inhaler       . ramipril (ALTACE) 10 MG capsule Take 10 mg by mouth 2 (two) times daily.       . SYNTHROID 112 MCG tablet Take 112 mcg by mouth daily before breakfast.       . triamterene-hydrochlorothiazide  (MAXZIDE-25) 37.5-25 MG per tablet Take 1 tablet by mouth daily.        No current facility-administered medications for this visit.    Allergies  Allergen Reactions  . Adhesive [Tape] Itching  . Amoxicillin Hives, Itching and Swelling  . Latex Itching    History   Social History  . Marital Status: Widowed    Spouse Name: Ariel Cobb    Number of Children: Ariel Cobb  . Years of Education: Ariel Cobb   Occupational History  . Not on file.   Social History Main Topics  . Smoking status: Current Every Day Smoker -- 0.75 packs/day    Types: Cigarettes  . Smokeless tobacco: Not on file  . Alcohol Use: No  . Drug Use: No  . Sexual Activity: Not on file   Other Topics Concern  . Not on file   Social History Narrative  . No narrative on file     Review of Systems: General: negative for chills, fever, night sweats or weight changes.  Cardiovascular: negative for chest pain, dyspnea on exertion, edema, orthopnea, palpitations, paroxysmal nocturnal dyspnea or shortness of breath Dermatological: negative for rash Respiratory: negative for cough or wheezing Urologic: negative for hematuria Abdominal: negative for nausea, vomiting, diarrhea, bright red blood per rectum, melena, or hematemesis Neurologic: negative for visual changes, syncope,   or dizziness All other systems reviewed and are otherwise negative except as noted above.    Blood pressure 140/60, pulse 80, height 5' 6" (1.676 m), weight 140 lb 11.2 oz (63.821 kg).  General appearance: alert and no distress Neck: no adenopathy, no carotid bruit, no JVD, supple, symmetrical, trachea midline and thyroid not enlarged, symmetric, no tenderness/mass/nodules Lungs: clear to auscultation bilaterally Heart: regular rate and rhythm, S1, S2 normal, no murmur, click, rub or gallop Abdomen: soft, non-tender; bowel sounds normal; no masses,  no organomegaly Extremities: extremities normal, atraumatic, no cyanosis or edema Pulses: 2+ and  symmetric 1+ right and 2+ left pedal pulses  EKG not performed today  ASSESSMENT AND PLAN:   Claudication 57-year-old female with a history of claudication and recent arterial Dopplers performed in office 04/25/13 revealing a right ankle brachial index of 0.93 with a with a high-frequency signal in her right popliteal. Based on this am going to arrange for her to undergo abdominal aortography with bilateral runoff potential endovascular therapy for lifestyle limiting claudication.  Essential hypertension Under good control and her medications  Hyperlipidemia On statin therapy followed by her PCP      Sebastyan Snodgrass J. Yee Gangi MD FACP,FACC,FAHA, FSCAI 05/06/2013 3:55 PM  

## 2013-06-14 ENCOUNTER — Ambulatory Visit (INDEPENDENT_AMBULATORY_CARE_PROVIDER_SITE_OTHER): Payer: Medicare Other | Admitting: Cardiovascular Disease

## 2013-06-14 ENCOUNTER — Encounter: Payer: Self-pay | Admitting: Cardiovascular Disease

## 2013-06-14 ENCOUNTER — Encounter (HOSPITAL_COMMUNITY): Payer: Self-pay | Admitting: Cardiovascular Disease

## 2013-06-14 VITALS — BP 130/60 | HR 82 | Ht 65.0 in | Wt 135.0 lb

## 2013-06-14 DIAGNOSIS — Z79899 Other long term (current) drug therapy: Secondary | ICD-10-CM | POA: Diagnosis not present

## 2013-06-14 DIAGNOSIS — I739 Peripheral vascular disease, unspecified: Secondary | ICD-10-CM | POA: Diagnosis not present

## 2013-06-14 DIAGNOSIS — Z01818 Encounter for other preprocedural examination: Secondary | ICD-10-CM

## 2013-06-14 DIAGNOSIS — D689 Coagulation defect, unspecified: Secondary | ICD-10-CM | POA: Diagnosis not present

## 2013-06-14 NOTE — Assessment & Plan Note (Signed)
Well-controlled on current medications 

## 2013-06-14 NOTE — Assessment & Plan Note (Signed)
I angiogrammed Ariel Cobb on 06/04/13 revealing a 95% calcified right popliteal artery stenosis in the PE tube segment with 3 vessel runoff. This is best treated with diamondback orbital rotational atherectomy. I will plan on performing this at first the year.

## 2013-06-14 NOTE — Progress Notes (Signed)
06/14/2013 Ariel Cobb   Feb 05, 1946  [id number]  Primary Physician Ariel Apo, MD Primary Cardiologist: Ariel Gess MD Ariel Cobb   HPI:  Ariel Cobb is a 67 year old thin appearing widowed Caucasian female no children he lives alone. Accompanied by her sister. She was referred by Dr. Cristie Cobb at [facility] for evaluation and treatment of claudication. Her primary care physician is Dr. Rebecka Cobb at [facility]. Factors include a 50-pack-year history tobacco abuse currently smoking one pack per day, 2 hypertension, diabetes and hyperlipidemia. Her father died suddenly of myocardial infarction at age 17. She has never had a heart attack or stroke patient is compared to sleep denies chest pain. She complains of right calf claudication with recent arterial Dopplers performed in our office on 04/25/13 revealing a right ABI 0.93 with a high-frequency signal in the right popliteal artery.I angiogrammed her 06/04/13 revealing high-grade calcified exophytic plaque in the P2 segment of the right popliteal artery. She will need diamondback orbital rotational atherectomy for this.    Current Outpatient Prescriptions  Medication Sig Dispense Refill  . aspirin EC 81 MG tablet Take 81 mg by mouth daily.      [name] atorvastatin (LIPITOR) 20 MG tablet Take 20 mg by mouth daily at 6 PM.       . budesonide-formoterol (SYMBICORT) 160-4.5 MCG/ACT inhaler Inhale 2 puffs into the lungs 2 (two) times daily.      . Calcium Carb-Cholecalciferol (CALCIUM 600/VITAMIN D3) 600-800 MG-UNIT TABS Take 1 tablet by mouth daily.       [name] GLIPIZIDE XL 10 MG 24 hr tablet Take 10 mg by mouth daily.       . Homeopathic Products ([employer] LEG CRAMPS PAIN RELIEF PO) Take 1 tablet by mouth at bedtime.      . insulin glargine (LANTUS) 100 UNIT/ML injection Inject 4-8 Units into the skin at bedtime.       . metFORMIN (GLUCOPHAGE) 1000 MG tablet Take 1,000 mg by mouth 2 (two) times daily.       .  pioglitazone (ACTOS) 45 MG tablet Take 45 mg by mouth daily.       [name] PROAIR HFA 108 (90 BASE) MCG/ACT inhaler Inhale 1 puff into the lungs every 4 (four) hours as needed for shortness of breath.       . ramipril (ALTACE) 10 MG capsule Take 10 mg by mouth 2 (two) times daily.       [name] SYNTHROID 112 MCG tablet Take 112 mcg by mouth daily before breakfast.       . triamterene-hydrochlorothiazide (MAXZIDE-25) 37.5-25 MG per tablet Take 1 tablet by mouth daily.        No current facility-administered medications for this visit.    Allergies  Allergen Reactions  . Adhesive [Tape] Itching  . Amoxicillin Hives, Itching and Swelling  . Latex Itching    History   Social History  . Marital Status: Widowed    Spouse Name: N/A    Number of Children: N/A  . Years of Education: N/A   Occupational History  . Not on file.   Social History Main Topics  . Smoking status: Current Every Day Smoker -- 0.75 packs/day    Types: Cigarettes  . Smokeless tobacco: Not on file  . Alcohol Use: No  . Drug Use: No  . Sexual Activity: Not on file   Other Topics Concern  . Not on file   Social History Narrative  . No narrative on file  Review of Systems: General: negative for chills, fever, night sweats or weight changes.  Cardiovascular: negative for chest pain, dyspnea on exertion, edema, orthopnea, palpitations, paroxysmal nocturnal dyspnea or shortness of breath Dermatological: negative for rash Respiratory: negative for cough or wheezing Urologic: negative for hematuria Abdominal: negative for nausea, vomiting, diarrhea, bright red blood per rectum, melena, or hematemesis Neurologic: negative for visual changes, syncope, or dizziness All other systems reviewed and are otherwise negative except as noted above.    Blood pressure 130/60, pulse 82, height 5\' 5"  (1.651 m), weight 135 lb (61.236 kg).  General appearance: alert and no distress Neck: no adenopathy, no carotid bruit, no JVD,  supple, symmetrical, trachea midline and thyroid not enlarged, symmetric, no tenderness/mass/nodules Lungs: clear to auscultation bilaterally Heart: regular rate and rhythm, S1, S2 normal, no murmur, click, rub or gallop Extremities: her left groin puncture site is well-healed with some mild surrounding ecchymosis  EKG not performed today  ASSESSMENT AND PLAN:   Claudication I angiogrammed Ariel Cobb on 06/04/13 revealing a 95% calcified right popliteal artery stenosis in the PE tube segment with 3 vessel runoff. This is best treated with diamondback orbital rotational atherectomy. I will plan on performing this at first the year.  Essential hypertension Well-controlled on current medications      Ariel Gess MD [facility] 06/14/2013 10:17 AM

## 2013-06-14 NOTE — Patient Instructions (Signed)
Dr. [name] has ordered a peripheral angiogram (diamond back procedure)  to be done at [facility].  This procedure is going to look at the bloodflow in your lower extremities.  If Dr. [name] is able to open up the arteries, you will have to spend one night in the hospital.  If he is not able to open the arteries, you will be able to go home that same day.    After the procedure, you will not be allowed to drive for 3 days or push, pull, or lift anything greater than 10 lbs for one week.    You will be required to have bloodwork prior to your procedure.  Our scheduler will advise you on when this item needs to be done.    REPS: Minerva Areola and Tara Left groin

## 2013-07-15 ENCOUNTER — Telehealth: Payer: Self-pay | Admitting: Cardiovascular Disease

## 2013-07-15 NOTE — Telephone Encounter (Signed)
Discussed w/ Curt Bears, RN w/ Dr. Gwenlyn Found.  Titanium rod will not interfere w/ procedure.    Returned call and pt verified x 2.  Pt informed message received and rod will not interfere w/ procedure.  Pt verbalized understanding and stated she was also concerned b/c at the dentist she has to have Abxs before they will do anything and she forgot about it when she had the first procedure done.  Pt informed the risk of infection in the heart is different w/ dental procedures and some people do require Abxs before those types of procedures.  Pt informed no Abx ordered for this procedure and that it will be similar to the cath she had.  Pt verbalized understanding and agreed w/ plan.

## 2013-07-15 NOTE — Telephone Encounter (Signed)
Has some questions about her procedure on Feb 2nd.  Wants to make sure we are aware she has titanium rod in leg.  Please call

## 2013-07-16 ENCOUNTER — Encounter (HOSPITAL_COMMUNITY): Payer: Self-pay | Admitting: Pharmacy Technician

## 2013-07-24 DIAGNOSIS — D689 Coagulation defect, unspecified: Secondary | ICD-10-CM | POA: Diagnosis not present

## 2013-07-24 DIAGNOSIS — Z79899 Other long term (current) drug therapy: Secondary | ICD-10-CM | POA: Diagnosis not present

## 2013-07-25 LAB — BASIC METABOLIC PANEL
BUN: 24 mg/dL — ABNORMAL HIGH (ref 6–23)
CO2: 25 mEq/L (ref 19–32)
Calcium: 9.1 mg/dL (ref 8.4–10.5)
Chloride: 103 mEq/L (ref 96–112)
Creat: 1.42 mg/dL — ABNORMAL HIGH (ref 0.50–1.10)
Glucose, Bld: 142 mg/dL — ABNORMAL HIGH (ref 70–99)
Potassium: 4.6 mEq/L (ref 3.5–5.3)
Sodium: 137 mEq/L (ref 135–145)

## 2013-07-25 LAB — APTT: aPTT: 31 seconds (ref 24–37)

## 2013-07-25 LAB — CBC
HCT: 29 % — ABNORMAL LOW (ref 36.0–46.0)
Hemoglobin: 9.5 g/dL — ABNORMAL LOW (ref 12.0–15.0)
MCH: 31.3 pg (ref 26.0–34.0)
MCHC: 32.8 g/dL (ref 30.0–36.0)
MCV: 95.4 fL (ref 78.0–100.0)
Platelets: 391 10*3/uL (ref 150–400)
RBC: 3.04 MIL/uL — ABNORMAL LOW (ref 3.87–5.11)
RDW: 17.2 % — ABNORMAL HIGH (ref 11.5–15.5)
WBC: 6.6 10*3/uL (ref 4.0–10.5)

## 2013-07-25 LAB — PROTIME-INR
INR: 0.95 (ref <1.50)
Prothrombin Time: 12.6 seconds (ref 11.6–15.2)

## 2013-07-29 ENCOUNTER — Encounter (HOSPITAL_COMMUNITY)
Admission: RE | Disposition: A | Discharge status: Home / Self Care | Payer: Self-pay | Source: Ambulatory Visit | Attending: Cardiovascular Disease

## 2013-07-29 ENCOUNTER — Ambulatory Visit (HOSPITAL_COMMUNITY)
Admission: RE | Admit: 2013-07-29 | Discharge: 2013-07-30 | Disposition: A | Discharge status: Home / Self Care | Payer: Medicare Other | Source: Ambulatory Visit | Attending: Cardiovascular Disease | Admitting: Cardiovascular Disease

## 2013-07-29 ENCOUNTER — Encounter (HOSPITAL_COMMUNITY): Payer: Self-pay | Admitting: General Practice

## 2013-07-29 DIAGNOSIS — I739 Peripheral vascular disease, unspecified: Secondary | ICD-10-CM | POA: Diagnosis present

## 2013-07-29 DIAGNOSIS — J45909 Unspecified asthma, uncomplicated: Secondary | ICD-10-CM | POA: Insufficient documentation

## 2013-07-29 DIAGNOSIS — I70219 Atherosclerosis of native arteries of extremities with intermittent claudication, unspecified extremity: Secondary | ICD-10-CM | POA: Insufficient documentation

## 2013-07-29 DIAGNOSIS — Z7982 Long term (current) use of aspirin: Secondary | ICD-10-CM | POA: Diagnosis not present

## 2013-07-29 DIAGNOSIS — D649 Anemia, unspecified: Secondary | ICD-10-CM | POA: Insufficient documentation

## 2013-07-29 DIAGNOSIS — Z01818 Encounter for other preprocedural examination: Secondary | ICD-10-CM

## 2013-07-29 DIAGNOSIS — F17200 Nicotine dependence, unspecified, uncomplicated: Secondary | ICD-10-CM | POA: Insufficient documentation

## 2013-07-29 DIAGNOSIS — E1169 Type 2 diabetes mellitus with other specified complication: Secondary | ICD-10-CM | POA: Diagnosis present

## 2013-07-29 DIAGNOSIS — N189 Chronic kidney disease, unspecified: Secondary | ICD-10-CM | POA: Diagnosis not present

## 2013-07-29 DIAGNOSIS — E039 Hypothyroidism, unspecified: Secondary | ICD-10-CM | POA: Diagnosis present

## 2013-07-29 DIAGNOSIS — E785 Hyperlipidemia, unspecified: Secondary | ICD-10-CM | POA: Insufficient documentation

## 2013-07-29 DIAGNOSIS — E119 Type 2 diabetes mellitus without complications: Secondary | ICD-10-CM | POA: Diagnosis present

## 2013-07-29 DIAGNOSIS — N1832 Chronic kidney disease, stage 3b: Secondary | ICD-10-CM | POA: Diagnosis present

## 2013-07-29 DIAGNOSIS — E782 Mixed hyperlipidemia: Secondary | ICD-10-CM | POA: Diagnosis present

## 2013-07-29 DIAGNOSIS — I129 Hypertensive chronic kidney disease with stage 1 through stage 4 chronic kidney disease, or unspecified chronic kidney disease: Secondary | ICD-10-CM | POA: Diagnosis not present

## 2013-07-29 DIAGNOSIS — I10 Essential (primary) hypertension: Secondary | ICD-10-CM | POA: Diagnosis present

## 2013-07-29 DIAGNOSIS — Z720 Tobacco use: Secondary | ICD-10-CM | POA: Diagnosis present

## 2013-07-29 HISTORY — DX: Unspecified osteoarthritis, unspecified site: M19.90

## 2013-07-29 HISTORY — DX: Unspecified chronic bronchitis: J42

## 2013-07-29 HISTORY — PX: ATHERECTOMY: SHX47

## 2013-07-29 HISTORY — DX: Hypothyroidism, unspecified: E03.9

## 2013-07-29 HISTORY — DX: Chronic kidney disease, unspecified: N18.9

## 2013-07-29 HISTORY — DX: Type 2 diabetes mellitus without complications: E11.9

## 2013-07-29 HISTORY — DX: Shortness of breath: R06.02

## 2013-07-29 HISTORY — DX: Peripheral vascular disease, unspecified: I73.9

## 2013-07-29 LAB — URINALYSIS, ROUTINE W REFLEX MICROSCOPIC
Bilirubin Urine: NEGATIVE
Glucose, UA: NEGATIVE mg/dL
Hgb urine dipstick: NEGATIVE
KETONES UR: NEGATIVE mg/dL
Leukocytes, UA: NEGATIVE
NITRITE: NEGATIVE
PH: 7 (ref 5.0–8.0)
Protein, ur: NEGATIVE mg/dL
Specific Gravity, Urine: 1.018 (ref 1.005–1.030)
Urobilinogen, UA: 0.2 mg/dL (ref 0.0–1.0)

## 2013-07-29 LAB — POCT ACTIVATED CLOTTING TIME
ACTIVATED CLOTTING TIME: 210 s
ACTIVATED CLOTTING TIME: 188 s
Activated Clotting Time: 204 seconds
Activated Clotting Time: 227 seconds
Activated Clotting Time: 160 seconds

## 2013-07-29 LAB — GLUCOSE, CAPILLARY
GLUCOSE-CAPILLARY: 124 mg/dL — AB (ref 70–99)
GLUCOSE-CAPILLARY: 131 mg/dL — AB (ref 70–99)
GLUCOSE-CAPILLARY: 119 mg/dL — AB (ref 70–99)
Glucose-Capillary: 190 mg/dL — ABNORMAL HIGH (ref 70–99)
Glucose-Capillary: 258 mg/dL — ABNORMAL HIGH (ref 70–99)

## 2013-07-29 SURGERY — ATHERECTOMY PERIPHERAL ARTERY
Laterality: Right

## 2013-07-29 MED ORDER — HYDRALAZINE HCL 20 MG/ML IJ SOLN
INTRAMUSCULAR | Status: AC
  Filled 2013-07-29: qty 1

## 2013-07-29 MED ORDER — FAMOTIDINE IN NACL 20-0.9 MG/50ML-% IV SOLN
INTRAVENOUS | Status: AC
  Filled 2013-07-29: qty 50

## 2013-07-29 MED ORDER — HEPARIN (PORCINE) IN NACL 2-0.9 UNIT/ML-% IJ SOLN
INTRAMUSCULAR | Status: AC
  Filled 2013-07-29: qty 1000

## 2013-07-29 MED ORDER — PIOGLITAZONE HCL 45 MG PO TABS
45.0000 mg | ORAL_TABLET | Freq: Every day | ORAL | Status: DC
  Administered 2013-07-30: 11:00:00 45 mg via ORAL
  Filled 2013-07-29 (×2): qty 1

## 2013-07-29 MED ORDER — TRIAMTERENE-HCTZ 37.5-25 MG PO TABS
1.0000 | ORAL_TABLET | Freq: Every day | ORAL | Status: DC
  Administered 2013-07-30: 11:00:00 1 via ORAL
  Filled 2013-07-29: qty 1

## 2013-07-29 MED ORDER — NITROGLYCERIN 0.2 MG/ML ON CALL CATH LAB
INTRAVENOUS | Status: AC
  Filled 2013-07-29: qty 1

## 2013-07-29 MED ORDER — VERAPAMIL HCL 2.5 MG/ML IV SOLN
INTRAVENOUS | Status: AC
  Filled 2013-07-29: qty 2

## 2013-07-29 MED ORDER — ATORVASTATIN CALCIUM 20 MG PO TABS
20.0000 mg | ORAL_TABLET | Freq: Every day | ORAL | Status: DC
  Administered 2013-07-29: 18:00:00 20 mg via ORAL
  Filled 2013-07-29 (×2): qty 1

## 2013-07-29 MED ORDER — HYDRALAZINE HCL 20 MG/ML IJ SOLN
10.0000 mg | Freq: Once | INTRAMUSCULAR | Status: AC
  Administered 2013-07-29: 14:00:00 10 mg via INTRAVENOUS
  Filled 2013-07-29: qty 1

## 2013-07-29 MED ORDER — ACETAMINOPHEN 325 MG PO TABS: 650.0000 mg | ORAL_TABLET | ORAL | Status: DC | PRN

## 2013-07-29 MED ORDER — SODIUM CHLORIDE 0.9 % IJ SOLN: 3.0000 mL | INTRAMUSCULAR | Status: DC | PRN

## 2013-07-29 MED ORDER — RAMIPRIL 10 MG PO CAPS
10.0000 mg | ORAL_CAPSULE | Freq: Two times a day (BID) | ORAL | Status: DC
  Administered 2013-07-29 – 2013-07-30 (×2): 10 mg via ORAL
  Filled 2013-07-29 (×3): qty 1

## 2013-07-29 MED ORDER — MORPHINE SULFATE 2 MG/ML IJ SOLN
2.0000 mg | INTRAMUSCULAR | Status: DC | PRN
  Administered 2013-07-29: 13:00:00 2 mg via INTRAVENOUS
  Filled 2013-07-29: qty 1

## 2013-07-29 MED ORDER — BUDESONIDE-FORMOTEROL FUMARATE 160-4.5 MCG/ACT IN AERO
2.0000 | INHALATION_SPRAY | Freq: Two times a day (BID) | RESPIRATORY_TRACT | Status: DC
  Filled 2013-07-29: qty 6

## 2013-07-29 MED ORDER — ASPIRIN EC 325 MG PO TBEC: 325.0000 mg | DELAYED_RELEASE_TABLET | Freq: Every day | ORAL | Status: DC

## 2013-07-29 MED ORDER — LEVOTHYROXINE SODIUM 112 MCG PO TABS
112.0000 ug | ORAL_TABLET | Freq: Every day | ORAL | Status: DC
  Administered 2013-07-30: 06:00:00 112 ug via ORAL
  Filled 2013-07-29 (×2): qty 1

## 2013-07-29 MED ORDER — CLOPIDOGREL BISULFATE 75 MG PO TABS
75.0000 mg | ORAL_TABLET | Freq: Every day | ORAL | Status: DC
  Administered 2013-07-30: 75 mg via ORAL
  Filled 2013-07-29: qty 1

## 2013-07-29 MED ORDER — HEPARIN SODIUM (PORCINE) 1000 UNIT/ML IJ SOLN
INTRAMUSCULAR | Status: AC
Start: 2013-07-29 — End: 2013-07-29
  Filled 2013-07-29: qty 1

## 2013-07-29 MED ORDER — LIDOCAINE HCL (PF) 1 % IJ SOLN
INTRAMUSCULAR | Status: AC
  Filled 2013-07-29: qty 30

## 2013-07-29 MED ORDER — ALBUTEROL SULFATE (2.5 MG/3ML) 0.083% IN NEBU: 3.0000 mL | INHALATION_SOLUTION | Freq: Four times a day (QID) | RESPIRATORY_TRACT | Status: DC | PRN

## 2013-07-29 MED ORDER — DIAZEPAM 5 MG PO TABS
5.0000 mg | ORAL_TABLET | ORAL | Status: AC
  Administered 2013-07-29: 5 mg via ORAL
  Filled 2013-07-29: qty 1

## 2013-07-29 MED ORDER — LIVING WELL WITH DIABETES BOOK
Freq: Once | Status: AC
  Administered 2013-07-29: 21:00:00
  Filled 2013-07-29: qty 1

## 2013-07-29 MED ORDER — GLIPIZIDE ER 10 MG PO TB24
10.0000 mg | ORAL_TABLET | Freq: Every day | ORAL | Status: DC
  Filled 2013-07-29 (×2): qty 1

## 2013-07-29 MED ORDER — SODIUM CHLORIDE 0.9 % IV SOLN
INTRAVENOUS | Status: DC
  Administered 2013-07-29: 07:00:00 via INTRAVENOUS

## 2013-07-29 MED ORDER — ASPIRIN 81 MG PO CHEW
81.0000 mg | CHEWABLE_TABLET | ORAL | Status: DC
Start: 2013-07-29 — End: 2013-07-29

## 2013-07-29 MED ORDER — ASPIRIN EC 81 MG PO TBEC
81.0000 mg | DELAYED_RELEASE_TABLET | Freq: Every day | ORAL | Status: DC
  Administered 2013-07-30: 81 mg via ORAL
  Filled 2013-07-29: qty 1

## 2013-07-29 MED ORDER — INSULIN GLARGINE 100 UNIT/ML ~~LOC~~ SOLN: 4.0000 [IU] | Freq: Every day | SUBCUTANEOUS | Status: DC

## 2013-07-29 MED ORDER — CLOPIDOGREL BISULFATE 300 MG PO TABS
ORAL_TABLET | ORAL | Status: AC
  Filled 2013-07-29: qty 1

## 2013-07-29 MED ORDER — HEPARIN (PORCINE) IN NACL 2-0.9 UNIT/ML-% IJ SOLN
INTRAMUSCULAR | Status: AC
Start: 2013-07-29 — End: 2013-07-29
  Filled 2013-07-29: qty 1000

## 2013-07-29 MED ORDER — FENTANYL CITRATE 0.05 MG/ML IJ SOLN
INTRAMUSCULAR | Status: AC
  Filled 2013-07-29: qty 2

## 2013-07-29 MED ORDER — ONDANSETRON HCL 4 MG/2ML IJ SOLN
4.0000 mg | Freq: Four times a day (QID) | INTRAMUSCULAR | Status: DC | PRN
  Administered 2013-07-29: 14:00:00 4 mg via INTRAVENOUS
  Filled 2013-07-29: qty 2

## 2013-07-29 MED ORDER — ONDANSETRON HCL 4 MG/2ML IJ SOLN
4.0000 mg | Freq: Four times a day (QID) | INTRAMUSCULAR | Status: DC
  Administered 2013-07-29: 4 mg via INTRAVENOUS
  Filled 2013-07-29: qty 2

## 2013-07-29 MED ORDER — INSULIN GLARGINE 100 UNIT/ML ~~LOC~~ SOLN
4.0000 [IU] | Freq: Every day | SUBCUTANEOUS | Status: DC
  Administered 2013-07-29: 22:00:00 4 [IU] via SUBCUTANEOUS
  Filled 2013-07-29 (×2): qty 0.04

## 2013-07-29 MED ORDER — SODIUM CHLORIDE 0.9 % IV SOLN: INTRAVENOUS | Status: AC

## 2013-07-29 NOTE — Progress Notes (Signed)
Site area: left groin  Site Prior to Removal:  Level 0  Pressure Applied For 30 MINUTES    Minutes Beginning at 1525  Manual:   yes  Patient Status During Pull:  AAO X4  Post Pull Groin Site:  Level 0  Post Pull Instructions Given:  yes  Post Pull Pulses Present:  yes  Dressing Applied:  yes  Comments:  TOLERATED PROCEDURE WELL

## 2013-07-29 NOTE — Interval H&P Note (Signed)
History and Physical Interval Note:  07/29/2013 7:40 AM  Ariel Cobb  has presented today for surgery, with the diagnosis of claudication  The various methods of treatment have been discussed with the patient and family. After consideration of risks, benefits and other options for treatment, the patient has consented to  Procedure(s): LOWER EXTREMITY ANGIOGRAM (N/A) as a surgical intervention .  The patient's history has been reviewed, patient examined, no change in status, stable for surgery.  I have reviewed the patient's chart and labs.  Questions were answered to the patient's satisfaction.     Lorretta Harp

## 2013-07-29 NOTE — H&P (Signed)
Chief Complaint:  Claudication  HPI: Ariel Cobb is a 68 year old thin appearing widowed Caucasian female no children he lives alone. Accompanied by her sister. She was referred by Dr. Ila Mcgill at [facility] for evaluation and treatment of claudication. Her primary care physician is Dr. Alta Corning at [facility]. Factors include a 50-pack-year history tobacco abuse currently smoking one pack per day, 2 hypertension, diabetes and hyperlipidemia. Her father died suddenly of microinfarction at age 32. She has never had a heart attack or stroke patient is compared to sleep denies chest pain. She complains of right calf claudication with recent arterial Dopplers performed in our office on 04/25/13 revealing a right ABI 0.93 with a high-frequency signal in the right popliteal artery. She underwent angiography on 12/9 revealing a high-grade right popliteal artery stenosis with three-vessel runoff. Because of moderate renal deficiency or intervention was staged until today.    PMHx:  Past Medical History  Diagnosis Date  . Thyroid disease   . Hypertension   . Asthma   . Osteoporosis   . Diabetes mellitus without complication   . Hyperlipidemia   . Claudication     Past Surgical History  Procedure Laterality Date  . Joint replacement    . Gallbladder surgery N/A     FAMHx:  Family History  Problem Relation Age of Onset  . Heart disease Father     SOCHx:   reports that she has been smoking Cigarettes.  She has been smoking about 0.75 packs per day. She does not have any smokeless tobacco history on file. She reports that she does not drink alcohol or use illicit drugs.  ALLERGIES:  Allergies  Allergen Reactions  . Adhesive [Tape] Itching  . Amoxicillin Hives, Itching and Swelling  . Latex Itching    ROS: Pertinent items are noted in HPI.  HOME MEDS: Medications Prior to Admission  Medication Sig Dispense Refill  . albuterol (PROVENTIL HFA;VENTOLIN HFA) 108 (90  BASE) MCG/ACT inhaler Inhale into the lungs every 6 (six) hours as needed for wheezing or shortness of breath.      [name] aspirin EC 81 MG tablet Take 81 mg by mouth daily.      [name] atorvastatin (LIPITOR) 20 MG tablet Take 20 mg by mouth daily at 6 PM.       . budesonide-formoterol (SYMBICORT) 160-4.5 MCG/ACT inhaler Inhale 2 puffs into the lungs 2 (two) times daily.      . Calcium Carb-Cholecalciferol (CALCIUM 600/VITAMIN D3) 600-800 MG-UNIT TABS Take 1 tablet by mouth daily.       [name] glipiZIDE (GLUCOTROL XL) 10 MG 24 hr tablet Take 10 mg by mouth daily with breakfast.      . Homeopathic Products ([employer]S LEG CRAMPS PAIN RELIEF PO) Take 1 tablet by mouth at bedtime.      . insulin glargine (LANTUS) 100 UNIT/ML injection Inject 4-8 Units into the skin at bedtime.       [name] levothyroxine (SYNTHROID, LEVOTHROID) 112 MCG tablet Take 112 mcg by mouth daily before breakfast.      . metFORMIN (GLUCOPHAGE) 1000 MG tablet Take 1,000 mg by mouth 2 (two) times daily.       . pioglitazone (ACTOS) 45 MG tablet Take 45 mg by mouth daily.       . ramipril (ALTACE) 10 MG capsule Take 10 mg by mouth 2 (two) times daily.       [name] triamterene-hydrochlorothiazide (MAXZIDE-25) 37.5-25 MG per tablet Take 1 tablet by mouth daily.  LABS/IMAGING: Results for orders placed during the hospital encounter of 07/29/13 (from the past 48 hour(s))  GLUCOSE, CAPILLARY     Status: Abnormal   Collection Time    07/29/13  6:26 AM      Result Value Range   Glucose-Capillary 124 (*) 70 - 99 mg/dL   No results found.  VITALS: Blood pressure 151/54, pulse 73, temperature 97.8 F (36.6 C), temperature source Oral, resp. rate 18, height 5\' 5"  (1.651 m), weight 140 lb (63.504 kg), SpO2 100.00%.  EXAM: General appearance: alert and no distress Neck: no adenopathy, no carotid bruit, no JVD, supple, symmetrical, trachea midline and thyroid not enlarged, symmetric, no tenderness/mass/nodules Lungs: clear to auscultation  bilaterally Heart: regular rate and rhythm, S1, S2 normal, no murmur, click, rub or gallop Extremities: extremities normal, atraumatic, no cyanosis or edema  IMPRESSION: Lifestyle limiting claudication presenting for angiography and potential intervention.  PLAN: Patient presents for angiography and intervention of her calcified right popliteal artery stenosis. There are been no changes in her history and physicals and her previous admission.  Ariel Cobb 07/29/2013, 7:38 AM

## 2013-07-29 NOTE — CV Procedure (Signed)
ERIYONNA MATSUSHITA is a 68 y.o. female    [id number] LOCATION:  FACILITY: [facility]  PHYSICIAN: Quay Burow, M.D. [birth year]   DATE OF PROCEDURE:  07/29/2013  DATE OF DISCHARGE:     PV Angiogram/Intervention    History obtained from chart review.Ms. Carreras is a 68 year old thin appearing widowed Caucasian female no children he lives alone. She was referred by Dr. Ila Mcgill at [facility] for evaluation and treatment of claudication. Her primary care physician is Dr. Alta Corning at [facility]. Factors include a 50-pack-year history tobacco abuse currently smoking one pack per day, 2 hypertension, diabetes and hyperlipidemia. Her father died suddenly of microinfarction at age 15. She has never had a heart attack or stroke patient is compared to sleep denies chest pain. She complains of right calf claudication with recent arterial Dopplers performed in our office on 04/25/13 revealing a right ABI 0.93 with a high-frequency signal in the right popliteal artery. She underwent angiography on 12/9 revealing a high-grade right popliteal artery stenosis with three-vessel runoff. Because of moderate renal deficiency her intervention was staged until today. She presents today for [employer] orbital rotational atherectomy of her right popliteal artery for what is now limiting claudication.    PROCEDURE DESCRIPTION:   The patient was brought to the second floor [facility] lab in the postabsorptive state. She was premedicated with Valium 5 mg by mouth, IV fentanyl. Her left groinwas prepped and shaved in usual sterile fashion. Xylocaine 1% was used for local anesthesia. A 5 French sheath was inserted into the left common femoral artery using standard Seldinger technique.a 5 [place] crossover catheter along with a 0 .35 Versicore  wire and Rosen wires were used. Contralateral access was obtained with a 7 French/65 cm long destination sheath. Visipaque dye was used for the entirety of the  case. Retrograde aortic pressure was monitored during the case. A total of 230 cc of contrast was administered to the patient. She received 7000 units of heparin with an ACT of 210. The popliteal artery stenosis was crossed with an 014 Rigali a wire through 8018 quick cross end hole catheter. Re: wire was then switched out for a 0. 14 viper wire. A 2 mm solid diamondback burr was used to perform orbital atherectomy of the calcified focal right popliteal artery stenosis at up to 120,000 rpm's. Intra-arterial nitroglycerin which was given generously.   HEMODYNAMICS:    AO SYSTOLIC/AO DIASTOLIC: 539/76   Angiographic Data:   1:6 runs were performed with the diamondback orbital rotational atherectomy device using a 2 mm bur up to 120,000 RPMs resulting in reduction of a 95% focal calcified stenosis in the P2 segment to less than 10% residual with excellent flow and no residual or dissection. The trifurcation remained intact.  IMPRESSION:successful diamondback orbital rotational atherectomy of the high-grade calcified right popliteal artery stenosis. The patient received 300 mg of by mouth Plavix. The sheath was was withdrawn across the bifurcation exchanged over a 0. 35 wire for a short 7 [place] sheath.the patient left the Cath Lab in stable condition. She'll be hydrated overnight and discharged home in the morning. She will get followup lower extremity arterial Doppler studies and will see me back in the office.    Lorretta Harp MD, [facility] 07/29/2013 9:22 AM

## 2013-07-30 ENCOUNTER — Other Ambulatory Visit: Payer: Self-pay | Admitting: Physician Assistant

## 2013-07-30 ENCOUNTER — Encounter (HOSPITAL_COMMUNITY): Payer: Self-pay | Admitting: Physician Assistant

## 2013-07-30 DIAGNOSIS — E785 Hyperlipidemia, unspecified: Secondary | ICD-10-CM | POA: Diagnosis not present

## 2013-07-30 DIAGNOSIS — I10 Essential (primary) hypertension: Secondary | ICD-10-CM

## 2013-07-30 DIAGNOSIS — N189 Chronic kidney disease, unspecified: Secondary | ICD-10-CM | POA: Diagnosis not present

## 2013-07-30 DIAGNOSIS — F17200 Nicotine dependence, unspecified, uncomplicated: Secondary | ICD-10-CM

## 2013-07-30 DIAGNOSIS — N1832 Chronic kidney disease, stage 3b: Secondary | ICD-10-CM | POA: Diagnosis present

## 2013-07-30 DIAGNOSIS — I739 Peripheral vascular disease, unspecified: Secondary | ICD-10-CM

## 2013-07-30 DIAGNOSIS — E039 Hypothyroidism, unspecified: Secondary | ICD-10-CM | POA: Diagnosis not present

## 2013-07-30 DIAGNOSIS — E119 Type 2 diabetes mellitus without complications: Secondary | ICD-10-CM | POA: Diagnosis present

## 2013-07-30 DIAGNOSIS — I129 Hypertensive chronic kidney disease with stage 1 through stage 4 chronic kidney disease, or unspecified chronic kidney disease: Secondary | ICD-10-CM | POA: Diagnosis not present

## 2013-07-30 DIAGNOSIS — I70219 Atherosclerosis of native arteries of extremities with intermittent claudication, unspecified extremity: Secondary | ICD-10-CM | POA: Diagnosis not present

## 2013-07-30 LAB — CBC
HEMATOCRIT: 26.8 % — AB (ref 36.0–46.0)
Hemoglobin: 8.9 g/dL — ABNORMAL LOW (ref 12.0–15.0)
MCH: 31.2 pg (ref 26.0–34.0)
MCHC: 33.2 g/dL (ref 30.0–36.0)
MCV: 94 fL (ref 78.0–100.0)
Platelets: 332 10*3/uL (ref 150–400)
RBC: 2.85 MIL/uL — AB (ref 3.87–5.11)
RDW: 16.4 % — AB (ref 11.5–15.5)
WBC: 8.1 10*3/uL (ref 4.0–10.5)

## 2013-07-30 LAB — BASIC METABOLIC PANEL
BUN: 22 mg/dL (ref 6–23)
CO2: 23 mEq/L (ref 19–32)
CREATININE: 1.51 mg/dL — AB (ref 0.50–1.10)
Calcium: 8.5 mg/dL (ref 8.4–10.5)
Chloride: 100 mEq/L (ref 96–112)
GFR calc Af Amer: 40 mL/min — ABNORMAL LOW (ref >90)
GFR calc non Af Amer: 34 mL/min — ABNORMAL LOW (ref >90)
Glucose, Bld: 156 mg/dL — ABNORMAL HIGH (ref 70–99)
Potassium: 4.8 mEq/L (ref 3.7–5.3)
Sodium: 135 mEq/L — ABNORMAL LOW (ref 137–147)

## 2013-07-30 LAB — GLUCOSE, CAPILLARY: GLUCOSE-CAPILLARY: 144 mg/dL — AB (ref 70–99)

## 2013-07-30 MED ORDER — CLOPIDOGREL BISULFATE 75 MG PO TABS: 75.0000 mg | ORAL_TABLET | Freq: Every day | ORAL | Status: DC

## 2013-07-30 NOTE — Discharge Summary (Signed)
Discharge Summary   Patient ID: PHENICIA BENNETTE MRN: [id number], DOB/AGE: 70-Jul-1947 70 y.o. Admit date: 07/29/2013 D/C date:     07/30/2013  Primary Cardiologist: Dr. Gwenlyn Found  Active Problems:   PAD (peripheral artery disease)- s/p  PV angiogram with successful diamondback orbital rotational atherectomy of the high-grade calcified right popliteal artery stenosis on this admission.   Hyperlipidemia   Tobacco abuse   Claudication   Hypothyroidism   Type II diabetes mellitus   Chronic kidney disease   Essential hypertension   Hypothyroidism   Hospital Course: Ms.Skirvin is a 68 year old with a 50-pack-year history tobacco abuse currently smoking one ppd, hypertension, diabetes and hyperlipidemia who presented to [facility] on 07/29/13 for angiography and possible intervention of her calcified right popliteal artery stenosis. She had been complaining of right calf claudication with recent arterial Dopplers performed in the office on 04/25/13 revealing a right ABI 0.93 with a high-frequency signal in the right popliteal artery. She underwent angiography on 06/04/13 revealing a high-grade right popliteal artery stenosis with three-vessel runoff. Because of moderate renal deficiency or intervention was staged. She underwent PV angiogram on 07/29/13 with successful diamondback orbital rotational atherectomy of the high-grade calcified right popliteal artery stenosis. The patient received 300 mg of by mouth Plavix. She was hydrated overnight and observed. Her creatinine is elevated to 1.51 which is stable at her baseline. She was also noted to have worsening of her baseline anemia (H/H of 8.9/26.8) which should be followed up as an a outpatient by her PCP. She had no bleeding on this admission.   The patient has had an uncomplicated hospital course and is feeling well. Dr. Irish Lack has evaluated her today and deemed her ready for discharge home. She will continue all of her home medications with the addition of  plavix. Additionally, her metformin will be discontinued due to her recent contrast dye exposure and renal insufficincy. She has been instructed to follow up with her PCP as soon as possible. She verbalized understanding. Tobacco cessation counseling was performed as well. She will follow up with Dr. Gwenlyn Found with lower extremity arterial Doppler studies-- voicemail with appointment desk was left.     Discharge Vitals: Blood pressure 137/47, pulse 70, temperature 97.9 F (36.6 C), temperature source Oral, resp. rate 16, height 5\' 5"  (1.651 m), weight 139 lb 5.3 oz (63.2 kg), SpO2 94.00%.  Labs: Lab Results  Component Value Date   WBC 8.1 07/30/2013   HGB 8.9* 07/30/2013   HCT 26.8* 07/30/2013   MCV 94.0 07/30/2013   PLT 332 07/30/2013     Recent Labs Lab 07/30/13 0540  NA 135*  K 4.8  CL 100  CO2 23  BUN 22  CREATININE 1.51*  CALCIUM 8.5  GLUCOSE 156*    Diagnostic Studies/Procedures   PV Angiogram/Intervention  History obtained from chart review.Ms. Hasse is a 68 year old thin appearing widowed Caucasian female no children he lives alone. She was referred by Dr. Ila Mcgill at [facility] for evaluation and treatment of claudication. Her primary care physician is Dr. Alta Corning at [facility]. Factors include a 50-pack-year history tobacco abuse currently smoking one pack per day, 2 hypertension, diabetes and hyperlipidemia. Her father died suddenly of microinfarction at age 63. She has never had a heart attack or stroke patient is compared to sleep denies chest pain. She complains of right calf claudication with recent arterial Dopplers performed in our office on 04/25/13 revealing a right ABI 0.93 with a high-frequency signal in the right popliteal  artery. She underwent angiography on 12/9 revealing a high-grade right popliteal artery stenosis with three-vessel runoff. Because of moderate renal deficiency her intervention was staged until today. She presents today for [employer]  orbital rotational atherectomy of her right popliteal artery for what is now limiting claudication.  PROCEDURE DESCRIPTION:  The patient was brought to the second floor [facility] cath lab in the postabsorptive state. She was premedicated with Valium 5 mg by mouth, IV fentanyl. Her left groinwas prepped and shaved in usual sterile fashion. Xylocaine 1% was used for local anesthesia. A 5 French sheath was inserted into the left common femoral artery using standard Seldinger technique.a 5 [place] crossover catheter along with a 0 .35 Versicore wire and Rosen wires were used. Contralateral access was obtained with a 7 French/65 cm long destination sheath. Visipaque dye was used for the entirety of the case. Retrograde aortic pressure was monitored during the case. A total of 230 cc of contrast was administered to the patient. She received 7000 units of heparin with an ACT of 210. The popliteal artery stenosis was crossed with an 014 Rigali a wire through 8018 quick cross end hole catheter. Re: wire was then switched out for a 0. 14 viper wire. A 2 mm solid diamondback burr was used to perform orbital atherectomy of the calcified focal right popliteal artery stenosis at up to 120,000 rpm's. Intra-arterial nitroglycerin which was given generously.  HEMODYNAMICS:  AO SYSTOLIC/AO DIASTOLIC: 093/26  Angiographic Data:  1:6 runs were performed with the diamondback orbital rotational atherectomy device using a 2 mm bur up to 120,000 RPMs resulting in reduction of a 95% focal calcified stenosis in the P2 segment to less than 10% residual with excellent flow and no residual or dissection. The trifurcation remained intact.  IMPRESSION:successful diamondback orbital rotational atherectomy of the high-grade calcified right popliteal artery stenosis. The patient received 300 mg of by mouth Plavix. The sheath was was withdrawn across the bifurcation exchanged over a 0. 35 wire for a short 7 [place] sheath.the patient  left the Cath Lab in stable condition. She'll be hydrated overnight and discharged home in the morning. She will get followup lower extremity arterial Doppler studies and will see me back in the office.   Discharge Medications     Medication List    STOP taking these medications       metFORMIN 1000 MG tablet  Commonly known as:  GLUCOPHAGE      TAKE these medications       albuterol 108 (90 BASE) MCG/ACT inhaler  Commonly known as:  PROVENTIL HFA;VENTOLIN HFA  Inhale into the lungs every 6 (six) hours as needed for wheezing or shortness of breath.     aspirin EC 81 MG tablet  Take 81 mg by mouth daily.     atorvastatin 20 MG tablet  Commonly known as:  LIPITOR  Take 20 mg by mouth daily at 6 PM.     budesonide-formoterol 160-4.5 MCG/ACT inhaler  Commonly known as:  SYMBICORT  Inhale 2 puffs into the lungs 2 (two) times daily.     CALCIUM 600/VITAMIN D3 600-800 MG-UNIT Tabs  Generic drug:  Calcium Carb-Cholecalciferol  Take 1 tablet by mouth daily.     clopidogrel 75 MG tablet  Commonly known as:  PLAVIX  Take 1 tablet (75 mg total) by mouth daily with breakfast.     [employer]S LEG CRAMPS PAIN RELIEF PO  Take 1 tablet by mouth at bedtime.     glipiZIDE 10 MG  24 hr tablet  Commonly known as:  GLUCOTROL XL  Take 10 mg by mouth daily with breakfast.     LANTUS SOLOSTAR 100 UNIT/ML Solostar Pen  Generic drug:  Insulin Glargine  Inject 4-6 Units into the skin at bedtime. 4 units for blood sugar < 400 and 6 units for blood sugar >= 400     levothyroxine 112 MCG tablet  Commonly known as:  SYNTHROID, LEVOTHROID  Take 112 mcg by mouth daily before breakfast.     pioglitazone 45 MG tablet  Commonly known as:  ACTOS  Take 45 mg by mouth daily.     ramipril 10 MG capsule  Commonly known as:  ALTACE  Take 10 mg by mouth 2 (two) times daily.     triamterene-hydrochlorothiazide 37.5-25 MG per tablet  Commonly known as:  MAXZIDE-25  Take 1 tablet by mouth daily.         Disposition   The patient will be discharged in stable condition to home.  Follow-up Information   Follow up with Kandice Hams, MD. ([place] ASAP- you will need to discuss DM management as you will not be taking Metformin and need follow up blood work)    Specialty:  Internal Medicine   Contact information:   [address] [place] [address] [phone]       Follow up with Lorretta Harp, MD. (They will call you to make an appointment for an ultrasound of your legs in 10 days and a follow up apt with Dr. Gwenlyn Found. If they do not contact you for some reason please call them )    Specialty:  Cardiology   Contact information:   [address] [place] [address] [phone]         Duration of Discharge Encounter: Greater than 30 minutes including physician and PA time, going over her issues with renal insufficiency, anemia, and f/u Doppler studies.  Signed, Vertell Limber, KATHRYN PA-C 07/30/2013, 10:13 AM  I have examined the patient and reviewed assessment and plan and discussed with patient.  Agree with above as stated.  Baseline anemia and renal insufficiency at times, Cr up to 1.4 in the past.  Will need f/u labs and Dopplers.  No hematoma.  Bilateral PT pulses palpable.  Andromeda Poppen S.

## 2013-07-31 ENCOUNTER — Encounter: Payer: Self-pay | Admitting: *Deleted

## 2013-08-07 DIAGNOSIS — E782 Mixed hyperlipidemia: Secondary | ICD-10-CM | POA: Diagnosis not present

## 2013-08-07 DIAGNOSIS — I739 Peripheral vascular disease, unspecified: Secondary | ICD-10-CM | POA: Diagnosis not present

## 2013-08-07 DIAGNOSIS — I10 Essential (primary) hypertension: Secondary | ICD-10-CM | POA: Diagnosis not present

## 2013-08-07 DIAGNOSIS — N183 Chronic kidney disease, stage 3 unspecified: Secondary | ICD-10-CM | POA: Diagnosis not present

## 2013-08-07 DIAGNOSIS — E1129 Type 2 diabetes mellitus with other diabetic kidney complication: Secondary | ICD-10-CM | POA: Diagnosis not present

## 2013-08-09 ENCOUNTER — Ambulatory Visit (HOSPITAL_COMMUNITY)
Admission: RE | Admit: 2013-08-09 | Discharge: 2013-08-09 | Disposition: A | Discharge status: Home / Self Care | Payer: Medicare Other | Source: Ambulatory Visit | Attending: Internal Medicine | Admitting: Internal Medicine

## 2013-08-09 ENCOUNTER — Other Ambulatory Visit (HOSPITAL_COMMUNITY): Payer: Self-pay | Admitting: Internal Medicine

## 2013-08-09 DIAGNOSIS — E119 Type 2 diabetes mellitus without complications: Secondary | ICD-10-CM | POA: Insufficient documentation

## 2013-08-09 DIAGNOSIS — I739 Peripheral vascular disease, unspecified: Secondary | ICD-10-CM | POA: Diagnosis not present

## 2013-08-09 DIAGNOSIS — Z9889 Other specified postprocedural states: Secondary | ICD-10-CM | POA: Diagnosis not present

## 2013-08-09 NOTE — Progress Notes (Signed)
Right Lower Extremity Arterial Duplex Completed. °Brianna L Mazza,RVT °

## 2013-08-14 ENCOUNTER — Encounter: Payer: Self-pay | Admitting: Cardiovascular Disease

## 2013-08-14 ENCOUNTER — Ambulatory Visit (INDEPENDENT_AMBULATORY_CARE_PROVIDER_SITE_OTHER): Payer: Medicare Other | Admitting: Cardiovascular Disease

## 2013-08-14 VITALS — BP 150/62 | HR 80 | Ht 65.5 in | Wt 136.9 lb

## 2013-08-14 DIAGNOSIS — I739 Peripheral vascular disease, unspecified: Secondary | ICD-10-CM | POA: Diagnosis not present

## 2013-08-14 DIAGNOSIS — I10 Essential (primary) hypertension: Secondary | ICD-10-CM

## 2013-08-14 DIAGNOSIS — E785 Hyperlipidemia, unspecified: Secondary | ICD-10-CM | POA: Diagnosis not present

## 2013-08-14 NOTE — Assessment & Plan Note (Signed)
Well-controlled on current medications 

## 2013-08-14 NOTE — Assessment & Plan Note (Signed)
Because of lifestyle limiting claudication and Dopplers suggestive of a right popliteal artery stenosis I performed angiography on Ms. Ariel Cobb 07/29/13. I demonstrated a 99% calcified P. Tube segment stenosis which I performed diamondback orbital rotational atherectomy on reducing her stenosis to less than 10% residual. Her followup Dopplers performed 08/09/13 revealed an increase in her right ABI from 0.93 1.2 and a reduction in her popliteal artery velocities from 570-141 cm/s. She really has not ambulated enough to notice a significant clinical improvement because of the weather. She has a 2+ pedal pulse on exam. My plan will be to be 2 recheck arterial Doppler studies in 6 months and see her back after that for further evaluation. She is on dual antiplatelet therapy.

## 2013-08-14 NOTE — Patient Instructions (Signed)
  We will see you back in follow up in 6 months with Dr Berry  Dr Berry has ordered lower extremity dopplers in 6 months   

## 2013-08-14 NOTE — Assessment & Plan Note (Signed)
On statin therapy followed by her PCP 

## 2013-08-14 NOTE — Progress Notes (Signed)
08/14/2013 Ariel Cobb   Nov 27, 1945  [id number]  Primary Physician Kandice Hams, MD Primary Cardiologist: Lorretta Harp MD Renae Gloss   HPI:  Ariel Cobb is a 68 year old thin appearing widowed Caucasian female no children he lives alone. She was referred by Dr. Ila Mcgill at [facility] for evaluation and treatment of claudication. Her primary care physician is Dr. Alta Corning at [facility]. Factors include a 50-pack-year history tobacco abuse currently smoking one pack per day, 2 hypertension, diabetes and hyperlipidemia. Her father died suddenly of microinfarction at age 11. She has never had a heart attack or stroke patient is compared to sleep denies chest pain. She complains of right calf claudication with recent arterial Dopplers performed in our office on 04/25/13 revealing a right ABI 0.93 with a high-frequency signal in the right popliteal artery. She underwent angiography on 12/9 revealing a high-grade right popliteal artery stenosis with three-vessel runoff. Because of moderate renal deficiency her intervention was staged until today.I performed angiography and intervention on her/2/15. She had a 99% calcified lesion in the right popliteal artery but I performed diamondback orbital rotational atherectomy on. Her symptoms are somewhat improved and her Doppler studies have normalized.   Current Outpatient Prescriptions  Medication Sig Dispense Refill  . albuterol (PROVENTIL HFA;VENTOLIN HFA) 108 (90 BASE) MCG/ACT inhaler Inhale into the lungs every 6 (six) hours as needed for wheezing or shortness of breath.      [name] aspirin EC 81 MG tablet Take 81 mg by mouth daily.      [name] atorvastatin (LIPITOR) 20 MG tablet Take 20 mg by mouth daily at 6 PM.       . budesonide-formoterol (SYMBICORT) 160-4.5 MCG/ACT inhaler Inhale 2 puffs into the lungs 2 (two) times daily.      . Calcium Carb-Cholecalciferol (CALCIUM 600/VITAMIN D3) 600-800 MG-UNIT TABS Take 1 tablet by  mouth daily.       . clopidogrel (PLAVIX) 75 MG tablet Take 1 tablet (75 mg total) by mouth daily with breakfast.  30 tablet  11  . glipiZIDE (GLUCOTROL XL) 10 MG 24 hr tablet Take 10 mg by mouth daily with breakfast.      . Homeopathic Products ([employer]S LEG CRAMPS PAIN RELIEF PO) Take 1 tablet by mouth at bedtime as needed.       . Insulin Glargine (LANTUS SOLOSTAR) 100 UNIT/ML Solostar Pen Inject 10-11 Units into the skin at bedtime. 11 units for blood sugar < 400 and 11+ units for blood sugar >= 400      . levothyroxine (SYNTHROID, LEVOTHROID) 112 MCG tablet Take 112 mcg by mouth daily before breakfast.      . pioglitazone (ACTOS) 45 MG tablet Take 45 mg by mouth daily.       . ramipril (ALTACE) 10 MG capsule Take 10 mg by mouth 2 (two) times daily.       [name] triamterene-hydrochlorothiazide (MAXZIDE-25) 37.5-25 MG per tablet Take 1 tablet by mouth daily.        No current facility-administered medications for this visit.    Allergies  Allergen Reactions  . Adhesive [Tape] Itching  . Amoxicillin Hives, Itching and Swelling  . Latex Itching    History   Social History  . Marital Status: Widowed    Spouse Name: N/A    Number of Children: N/A  . Years of Education: N/A   Occupational History  . Not on file.   Social History Main Topics  . Smoking status: Current Every Day  Smoker -- 0.75 packs/day for 50 years    Types: Cigarettes  . Smokeless tobacco: Never Used  . Alcohol Use: No  . Drug Use: No  . Sexual Activity: No   Other Topics Concern  . Not on file   Social History Narrative  . No narrative on file     Review of Systems: General: negative for chills, fever, night sweats or weight changes.  Cardiovascular: negative for chest pain, dyspnea on exertion, edema, orthopnea, palpitations, paroxysmal nocturnal dyspnea or shortness of breath Dermatological: negative for rash Respiratory: negative for cough or wheezing Urologic: negative for hematuria Abdominal: negative  for nausea, vomiting, diarrhea, bright red blood per rectum, melena, or hematemesis Neurologic: negative for visual changes, syncope, or dizziness All other systems reviewed and are otherwise negative except as noted above.    Blood pressure 150/62, pulse 80, height 5' 5.5" (1.664 m), weight 62.097 kg (136 lb 14.4 oz).  General appearance: alert and no distress Neck: no adenopathy, no carotid bruit, no JVD, supple, symmetrical, trachea midline and thyroid not enlarged, symmetric, no tenderness/mass/nodules Lungs: clear to auscultation bilaterally Heart: regular rate and rhythm, S1, S2 normal, no murmur, click, rub or gallop Extremities: extremities normal, atraumatic, no cyanosis or edema and well-healed left femoral artery puncture site, 2+ right pedal pulse  EKG not performed today  ASSESSMENT AND PLAN:   Claudication Because of lifestyle limiting claudication and Dopplers suggestive of a right popliteal artery stenosis I performed angiography on Ariel Cobb 07/29/13. I demonstrated a 99% calcified P. Tube segment stenosis which I performed diamondback orbital rotational atherectomy on reducing her stenosis to less than 10% residual. Her followup Dopplers performed 08/09/13 revealed an increase in her right ABI from 0.93 1.2 and a reduction in her popliteal artery velocities from 570-141 cm/s. She really has not ambulated enough to notice a significant clinical improvement because of the weather. She has a 2+ pedal pulse on exam. My plan will be to be 2 recheck arterial Doppler studies in 6 months and see her back after that for further evaluation. She is on dual antiplatelet therapy.  Essential hypertension Well-controlled on current medications  Hyperlipidemia On statin therapy followed by her PCP      Lorretta Harp MD [facility], [facility] 08/14/2013 2:26 PM

## 2013-08-26 DIAGNOSIS — E1129 Type 2 diabetes mellitus with other diabetic kidney complication: Secondary | ICD-10-CM | POA: Diagnosis not present

## 2013-08-26 DIAGNOSIS — I998 Other disorder of circulatory system: Secondary | ICD-10-CM | POA: Diagnosis not present

## 2013-08-28 DIAGNOSIS — E782 Mixed hyperlipidemia: Secondary | ICD-10-CM | POA: Diagnosis not present

## 2013-08-28 DIAGNOSIS — E1129 Type 2 diabetes mellitus with other diabetic kidney complication: Secondary | ICD-10-CM | POA: Diagnosis not present

## 2013-08-29 ENCOUNTER — Other Ambulatory Visit: Payer: Self-pay

## 2013-08-29 MED ORDER — CLOPIDOGREL BISULFATE 75 MG PO TABS: 75.0000 mg | ORAL_TABLET | Freq: Every day | ORAL | Status: DC

## 2013-08-30 ENCOUNTER — Other Ambulatory Visit: Payer: Self-pay | Admitting: *Deleted

## 2013-08-30 MED ORDER — CLOPIDOGREL BISULFATE 75 MG PO TABS: 75.0000 mg | ORAL_TABLET | Freq: Every day | ORAL | Status: DC

## 2013-09-12 ENCOUNTER — Encounter: Payer: Medicare Other | Attending: Internal Medicine

## 2013-09-12 VITALS — Ht 65.0 in | Wt 133.6 lb

## 2013-09-12 DIAGNOSIS — Z713 Dietary counseling and surveillance: Secondary | ICD-10-CM | POA: Diagnosis not present

## 2013-09-12 DIAGNOSIS — Z794 Long term (current) use of insulin: Secondary | ICD-10-CM

## 2013-09-12 DIAGNOSIS — E119 Type 2 diabetes mellitus without complications: Secondary | ICD-10-CM | POA: Insufficient documentation

## 2013-09-12 DIAGNOSIS — IMO0001 Reserved for inherently not codable concepts without codable children: Secondary | ICD-10-CM

## 2013-09-12 NOTE — Progress Notes (Signed)
Patient was seen on 09/12/13 for the first of a series of three diabetes self-management courses at the [facility].  Current HbA1c: 9.3%  The following learning objectives were met by the patient during this class:  Describe diabetes  State some common risk factors for diabetes  Defines the role of glucose and insulin  Identifies type of diabetes and pathophysiology  Describe the relationship between diabetes and cardiovascular risk  State the members of the Healthcare Team  States the rationale for glucose monitoring  State when to test glucose  State their individual Target Range  State the importance of logging glucose readings  Describe how to interpret glucose readings  Identifies A1C target  Explain the correlation between A1c and eAG values  State symptoms and treatment of high blood glucose  State symptoms and treatment of low blood glucose  Explain proper technique for glucose testing  Identifies proper sharps disposal  Handouts given during class include:  Living Well with Diabetes book  Carb Counting and Meal Planning book  Meal Plan Card  Carbohydrate guide  Meal planning worksheet  Low Sodium Flavoring Tips  The diabetes portion plate  M5Y to eAG Conversion Chart  Diabetes Medications  Diabetes Recommended Care Schedule  Support Group  Diabetes Success Plan  Core Class Satisfaction Survey  Follow-Up Plan:  Attend core 2

## 2013-09-16 DIAGNOSIS — M810 Age-related osteoporosis without current pathological fracture: Secondary | ICD-10-CM | POA: Diagnosis not present

## 2013-09-16 DIAGNOSIS — E1129 Type 2 diabetes mellitus with other diabetic kidney complication: Secondary | ICD-10-CM | POA: Diagnosis not present

## 2013-09-16 DIAGNOSIS — Z23 Encounter for immunization: Secondary | ICD-10-CM | POA: Diagnosis not present

## 2013-09-16 DIAGNOSIS — F17200 Nicotine dependence, unspecified, uncomplicated: Secondary | ICD-10-CM | POA: Diagnosis not present

## 2013-09-16 DIAGNOSIS — N183 Chronic kidney disease, stage 3 unspecified: Secondary | ICD-10-CM | POA: Diagnosis not present

## 2013-09-16 DIAGNOSIS — Z1331 Encounter for screening for depression: Secondary | ICD-10-CM | POA: Diagnosis not present

## 2013-09-16 DIAGNOSIS — E782 Mixed hyperlipidemia: Secondary | ICD-10-CM | POA: Diagnosis not present

## 2013-09-16 DIAGNOSIS — I10 Essential (primary) hypertension: Secondary | ICD-10-CM | POA: Diagnosis not present

## 2013-09-16 DIAGNOSIS — Z0000 Encounter for general adult medical examination without abnormal findings: Secondary | ICD-10-CM | POA: Diagnosis not present

## 2013-09-19 ENCOUNTER — Ambulatory Visit: Payer: Medicare Other

## 2013-09-26 ENCOUNTER — Ambulatory Visit: Payer: Medicare Other

## 2013-10-03 ENCOUNTER — Encounter: Payer: Self-pay | Admitting: *Deleted

## 2013-10-03 ENCOUNTER — Encounter: Payer: Medicare Other | Attending: Internal Medicine | Admitting: *Deleted

## 2013-10-03 VITALS — Ht 65.0 in | Wt 131.1 lb

## 2013-10-03 DIAGNOSIS — E119 Type 2 diabetes mellitus without complications: Secondary | ICD-10-CM

## 2013-10-03 DIAGNOSIS — Z713 Dietary counseling and surveillance: Secondary | ICD-10-CM | POA: Diagnosis not present

## 2013-10-03 NOTE — Patient Instructions (Addendum)
Plan:  Aim for 2-3 Carb Choices per meal (30-45 grams) +/- 1 either way  Aim for 0-15 Carbs per snack if hungry  Consider reading food labels for Total Carbohydrate and Fat Grams of foods Consider  increasing your activity level by walking for 30 minutes daily as tolerated Consider checking BG at alternate times per day as directed by MD  Consider taking medication  as directed by MD  Take glipizide in the morning Take Actos in the morning Have snack of carb and protein before bed. Take lantus  At the same time each day I do not want your morning readings below 70  Consider adding yogurt to you meals Always have a protein with your carbohydrate

## 2013-10-03 NOTE — Progress Notes (Signed)
Appt start time: 11:30 end time: 12:00.  Assessment:  Ariel Cobb has attended Group Core Class I. She is seen on  10/03/13 for individual diabetes education.  She is accompanied by her sister. She lives alone, shops and prepares her own meals. In review of her dietary intake, she is not consuming enough protein. She dislikes most protein sources. She is challenged by the smell and texture of some foods. We explored some vegetarian type protein sources. She was not interested in trying them.  Current HbA1c:  9.3%  MEDICATIONS: See List: glipizide, Humulin R 15 units breakfast and lunch, 12 units dinner. Lantus with gradually increase. I am concerned that increasing her Lantus will increase her FBS hypoglycemic readings.  DIETARY INTAKE: Skipping meals.  Usual physical activity:none, not interested    Intervention:  Nutrition counseling provided.  Provided education on macronutrients on glucose levels.  Provided education on carb counting, importance of regularly scheduled meals/snacks, and meal planning  Discussed effects of physical activity on glucose levels and long-term glucose control.  Recommended 150 minutes of physical activity/week.  Reviewed patient medications.  Discussed role of medication on blood glucose and possible side effects  Discussed recommendations for long-term diabetes self-care.  Established checklist for medical, dental, and emotional self-care.  Plan:  Eat 3 meals per day  Aim for 2-3 Carb Choices per meal (30-45 grams) +/- 1 either way  Aim for 0-15 Carbs per snack if hungry  Consider reading food labels for Total Carbohydrate and Fat Grams of foods Consider  increasing your activity level by walking for 30 minutes daily as tolerated Continue checking BG at alternate times per day as directed by MD  Take medication  as directed by MD  Take glipizide in the morning (has been taking in evening) Take Actos in the morning (has been taking in evening) Have snack  of carb and protein before bed. Take lantus  At the same time each day I do not want your morning readings below 70  Consider adding yogurt to you meals Always have a protein with your carbohydrate  Teaching Method Utilized: Visual Auditory Hands on  Barriers to learning/adherence to lifestyle change: lack of appetite, skipping meals, limited food likes  Diabetes self-care support plan:   [facility] support group  ADA website  Family  Demonstrated degree of understanding via:  Verbalized understanding   Monitoring/Evaluation:  Dietary intake, exercise, test glucose, and body weight , follow up : to call me next week to review glucose readings.

## 2013-10-17 ENCOUNTER — Encounter: Payer: Self-pay | Admitting: *Deleted

## 2013-10-17 ENCOUNTER — Telehealth: Payer: Self-pay | Admitting: *Deleted

## 2013-10-25 DIAGNOSIS — E1129 Type 2 diabetes mellitus with other diabetic kidney complication: Secondary | ICD-10-CM | POA: Diagnosis not present

## 2013-10-25 DIAGNOSIS — N183 Chronic kidney disease, stage 3 unspecified: Secondary | ICD-10-CM | POA: Diagnosis not present

## 2013-10-25 DIAGNOSIS — R233 Spontaneous ecchymoses: Secondary | ICD-10-CM | POA: Diagnosis not present

## 2013-11-07 DIAGNOSIS — H2510 Age-related nuclear cataract, unspecified eye: Secondary | ICD-10-CM | POA: Diagnosis not present

## 2013-11-07 DIAGNOSIS — E119 Type 2 diabetes mellitus without complications: Secondary | ICD-10-CM | POA: Diagnosis not present

## 2013-11-07 DIAGNOSIS — H524 Presbyopia: Secondary | ICD-10-CM | POA: Diagnosis not present

## 2013-11-07 DIAGNOSIS — H5210 Myopia, unspecified eye: Secondary | ICD-10-CM | POA: Diagnosis not present

## 2013-11-12 ENCOUNTER — Telehealth: Payer: Self-pay | Admitting: Cardiovascular Disease

## 2013-11-12 NOTE — Telephone Encounter (Signed)
Patient notified of results.

## 2013-11-12 NOTE — Telephone Encounter (Signed)
RN spoke to patient. Informed patient will defer to Dr Cecil Cobbs RN. Someone will contact her after Dr Gwenlyn Found makes a decision.

## 2013-11-12 NOTE — Telephone Encounter (Signed)
That is up to her ophthalmologist. If necessary she can safely stop her plavix for the cataract surgery although it is 'bloodless' surgery.  JJB

## 2013-11-12 NOTE — Telephone Encounter (Signed)
Pt is having cataract surgery on next Wednesday. Does she need to stop her Plavix?

## 2013-11-12 NOTE — Telephone Encounter (Signed)
Please advise 

## 2013-11-20 DIAGNOSIS — H2589 Other age-related cataract: Secondary | ICD-10-CM | POA: Diagnosis not present

## 2013-11-20 DIAGNOSIS — E119 Type 2 diabetes mellitus without complications: Secondary | ICD-10-CM | POA: Diagnosis not present

## 2013-11-20 DIAGNOSIS — H2510 Age-related nuclear cataract, unspecified eye: Secondary | ICD-10-CM | POA: Diagnosis not present

## 2013-12-04 DIAGNOSIS — E119 Type 2 diabetes mellitus without complications: Secondary | ICD-10-CM | POA: Diagnosis not present

## 2013-12-04 DIAGNOSIS — H2589 Other age-related cataract: Secondary | ICD-10-CM | POA: Diagnosis not present

## 2013-12-04 DIAGNOSIS — H2510 Age-related nuclear cataract, unspecified eye: Secondary | ICD-10-CM | POA: Diagnosis not present

## 2013-12-07 ENCOUNTER — Encounter: Payer: Self-pay | Admitting: Cardiovascular Disease

## 2013-12-07 ENCOUNTER — Telehealth: Payer: Self-pay | Admitting: Cardiovascular Disease

## 2013-12-09 NOTE — Telephone Encounter (Signed)
Closed encounter °

## 2013-12-11 DIAGNOSIS — F17200 Nicotine dependence, unspecified, uncomplicated: Secondary | ICD-10-CM | POA: Diagnosis not present

## 2013-12-11 DIAGNOSIS — I10 Essential (primary) hypertension: Secondary | ICD-10-CM | POA: Diagnosis not present

## 2013-12-11 DIAGNOSIS — I739 Peripheral vascular disease, unspecified: Secondary | ICD-10-CM | POA: Diagnosis not present

## 2013-12-11 DIAGNOSIS — N183 Chronic kidney disease, stage 3 unspecified: Secondary | ICD-10-CM | POA: Diagnosis not present

## 2013-12-17 ENCOUNTER — Other Ambulatory Visit: Payer: Self-pay | Admitting: Nephrology

## 2013-12-17 DIAGNOSIS — N183 Chronic kidney disease, stage 3 unspecified: Secondary | ICD-10-CM

## 2013-12-23 ENCOUNTER — Ambulatory Visit
Admission: RE | Admit: 2013-12-23 | Discharge: 2013-12-23 | Disposition: A | Discharge status: Home / Self Care | Payer: Medicare Other | Source: Ambulatory Visit | Attending: Nephrology | Admitting: Nephrology

## 2013-12-23 DIAGNOSIS — N183 Chronic kidney disease, stage 3 unspecified: Secondary | ICD-10-CM | POA: Diagnosis not present

## 2013-12-26 DIAGNOSIS — H6120 Impacted cerumen, unspecified ear: Secondary | ICD-10-CM | POA: Diagnosis not present

## 2013-12-26 DIAGNOSIS — H6500 Acute serous otitis media, unspecified ear: Secondary | ICD-10-CM | POA: Diagnosis not present

## 2013-12-26 DIAGNOSIS — J0120 Acute ethmoidal sinusitis, unspecified: Secondary | ICD-10-CM | POA: Diagnosis not present

## 2014-02-05 ENCOUNTER — Ambulatory Visit (HOSPITAL_COMMUNITY)
Admission: RE | Admit: 2014-02-05 | Discharge: 2014-02-05 | Disposition: A | Discharge status: Home / Self Care | Payer: Medicare Other | Source: Ambulatory Visit | Attending: Cardiovascular Disease | Admitting: Cardiovascular Disease

## 2014-02-05 DIAGNOSIS — I739 Peripheral vascular disease, unspecified: Secondary | ICD-10-CM | POA: Insufficient documentation

## 2014-02-05 NOTE — Progress Notes (Signed)
Arterial Duplex Lower Ext. Completed. Mariafernanda Hendricksen, BS, RDMS, RVT  

## 2014-02-13 ENCOUNTER — Encounter: Payer: Self-pay | Admitting: *Deleted

## 2014-02-14 ENCOUNTER — Ambulatory Visit: Payer: Medicare Other | Admitting: Cardiovascular Disease

## 2014-02-19 ENCOUNTER — Other Ambulatory Visit: Payer: Self-pay | Admitting: Internal Medicine

## 2014-02-19 DIAGNOSIS — F17200 Nicotine dependence, unspecified, uncomplicated: Secondary | ICD-10-CM | POA: Diagnosis not present

## 2014-02-19 DIAGNOSIS — R634 Abnormal weight loss: Secondary | ICD-10-CM | POA: Diagnosis not present

## 2014-02-19 DIAGNOSIS — N184 Chronic kidney disease, stage 4 (severe): Secondary | ICD-10-CM | POA: Diagnosis not present

## 2014-02-19 DIAGNOSIS — E1129 Type 2 diabetes mellitus with other diabetic kidney complication: Secondary | ICD-10-CM | POA: Diagnosis not present

## 2014-02-19 DIAGNOSIS — E039 Hypothyroidism, unspecified: Secondary | ICD-10-CM | POA: Diagnosis not present

## 2014-02-19 DIAGNOSIS — E782 Mixed hyperlipidemia: Secondary | ICD-10-CM | POA: Diagnosis not present

## 2014-02-19 DIAGNOSIS — I10 Essential (primary) hypertension: Secondary | ICD-10-CM | POA: Diagnosis not present

## 2014-02-20 ENCOUNTER — Encounter: Payer: Self-pay | Admitting: Interventional Cardiology

## 2014-02-24 ENCOUNTER — Ambulatory Visit
Admission: RE | Admit: 2014-02-24 | Discharge: 2014-02-24 | Disposition: A | Discharge status: Home / Self Care | Payer: Medicare Other | Source: Ambulatory Visit | Attending: Internal Medicine | Admitting: Internal Medicine

## 2014-02-24 DIAGNOSIS — R634 Abnormal weight loss: Secondary | ICD-10-CM

## 2014-02-24 DIAGNOSIS — J438 Other emphysema: Secondary | ICD-10-CM | POA: Diagnosis not present

## 2014-02-24 DIAGNOSIS — R911 Solitary pulmonary nodule: Secondary | ICD-10-CM | POA: Diagnosis not present

## 2014-02-25 ENCOUNTER — Encounter: Payer: Self-pay | Admitting: Cardiovascular Disease

## 2014-02-25 ENCOUNTER — Ambulatory Visit (INDEPENDENT_AMBULATORY_CARE_PROVIDER_SITE_OTHER): Payer: Medicare Other | Admitting: Cardiovascular Disease

## 2014-02-25 VITALS — BP 152/58 | HR 66 | Ht 65.0 in | Wt 123.0 lb

## 2014-02-25 DIAGNOSIS — I10 Essential (primary) hypertension: Secondary | ICD-10-CM

## 2014-02-25 DIAGNOSIS — I739 Peripheral vascular disease, unspecified: Secondary | ICD-10-CM

## 2014-02-25 DIAGNOSIS — R0989 Other specified symptoms and signs involving the circulatory and respiratory systems: Secondary | ICD-10-CM | POA: Diagnosis not present

## 2014-02-25 DIAGNOSIS — E785 Hyperlipidemia, unspecified: Secondary | ICD-10-CM | POA: Diagnosis not present

## 2014-02-25 NOTE — Progress Notes (Signed)
02/25/2014 Ariel Cobb   January 27, 1946  [id number]  Primary Physician Kandice Hams, MD Primary Cardiologist: Lorretta Harp MD Renae Gloss   HPI:  Ariel Cobb is a 68 year old thin appearing widowed Caucasian female no children he lives alone. She was referred by Dr. Ila Mcgill at [facility] for evaluation and treatment of claudication. Her primary care physician is Dr. Alta Corning at [facility]. Factors include a 50-pack-year history tobacco abuse currently smoking one pack per day, 2 hypertension, diabetes and hyperlipidemia. Her father died suddenly of microinfarction at age 21. She has never had a heart attack or stroke patient is compared to sleep denies chest pain. She complains of right calf claudication with recent arterial Dopplers performed in our office on 04/25/13 revealing a right ABI 0.93 with a high-frequency signal in the right popliteal artery. She underwent angiography on 12/9 revealing a high-grade right popliteal artery stenosis with three-vessel runoff. Because of moderate renal deficiency her intervention was staged until today.I performed angiography and intervention on her/2/15. She had a 99% calcified lesion in the right popliteal artery but I performed diamondback orbital rotational atherectomy on. Her symptoms of claudication resolved after her procedure and her Dopplers normalized. They have remained stable as of her most recent Doppler last month.    Current Outpatient Prescriptions  Medication Sig Dispense Refill  . albuterol (PROVENTIL HFA;VENTOLIN HFA) 108 (90 BASE) MCG/ACT inhaler Inhale into the lungs every 6 (six) hours as needed for wheezing or shortness of breath.      [name] aspirin EC 81 MG tablet Take 81 mg by mouth daily.      [name] atorvastatin (LIPITOR) 20 MG tablet Take 20 mg by mouth daily at 6 PM.       . budesonide-formoterol (SYMBICORT) 160-4.5 MCG/ACT inhaler Inhale 2 puffs into the lungs 2 (two) times daily.      . Calcium  Carb-Cholecalciferol (CALCIUM 600/VITAMIN D3) 600-800 MG-UNIT TABS Take 1 tablet by mouth daily.       . clopidogrel (PLAVIX) 75 MG tablet Take 1 tablet (75 mg total) by mouth daily with breakfast.  90 tablet  0  . glipiZIDE (GLUCOTROL XL) 10 MG 24 hr tablet Take 10 mg by mouth daily with breakfast.      . Homeopathic Products ([facility] LEG CRAMPS PAIN RELIEF PO) Take 1 tablet by mouth at bedtime as needed.       . Insulin Glargine (LANTUS SOLOSTAR) 100 UNIT/ML Solostar Pen Inject 10-11 Units into the skin at bedtime. 11 units for blood sugar < 400 and 11+ units for blood sugar >= 400      . levothyroxine (SYNTHROID, LEVOTHROID) 112 MCG tablet Take 112 mcg by mouth daily before breakfast.      . pioglitazone (ACTOS) 45 MG tablet Take 45 mg by mouth daily.       . ramipril (ALTACE) 10 MG capsule Take 10 mg by mouth 2 (two) times daily.       [name] triamterene-hydrochlorothiazide (MAXZIDE-25) 37.5-25 MG per tablet Take 1 tablet by mouth daily.        No current facility-administered medications for this visit.    Allergies  Allergen Reactions  . Adhesive [Tape] Itching  . Amoxicillin Hives, Itching and Swelling  . Latex Itching    History   Social History  . Marital Status: Widowed    Spouse Name: N/A    Number of Children: N/A  . Years of Education: N/A   Occupational History  . Not on  file.   Social History Main Topics  . Smoking status: Current Every Day Smoker -- 0.75 packs/day for 50 years    Types: Cigarettes  . Smokeless tobacco: Never Used  . Alcohol Use: No  . Drug Use: No  . Sexual Activity: No   Other Topics Concern  . Not on file   Social History Narrative  . No narrative on file     Review of Systems: General: negative for chills, fever, night sweats or weight changes.  Cardiovascular: negative for chest pain, dyspnea on exertion, edema, orthopnea, palpitations, paroxysmal nocturnal dyspnea or shortness of breath Dermatological: negative for rash Respiratory:  negative for cough or wheezing Urologic: negative for hematuria Abdominal: negative for nausea, vomiting, diarrhea, bright red blood per rectum, melena, or hematemesis Neurologic: negative for visual changes, syncope, or dizziness All other systems reviewed and are otherwise negative except as noted above.    Blood pressure 152/58, pulse 66, height 5\' 5"  (1.651 m), weight 123 lb (55.792 kg).  General appearance: alert and no distress Neck: no adenopathy, no JVD, supple, symmetrical, trachea midline, thyroid not enlarged, symmetric, no tenderness/mass/nodules and soft left carotid bruit Lungs: clear to auscultation bilaterally Heart: regular rate and rhythm, S1, S2 normal, no murmur, click, rub or gallop Extremities: extremities normal, atraumatic, no cyanosis or edema  EKG normal sinus rhythm at 66 without ST or T wave changes  ASSESSMENT AND PLAN:   Essential hypertension Controlled on current medications  Hyperlipidemia On statin therapy followed by her PCP  PAD (peripheral artery disease) History of diamondback or rotational atherectomy of her high-grade right popliteal artery lesion with an excellent angiographic clinical and Doppler results. Her most recent lower extremity arterial Doppler studies performed 02/05/14 revealed a right ABI 1.1 with normal popliteal velocities. She denies claudication.      Lorretta Harp MD FACP,FACC,FAHA, [facility] 02/25/2014 9:16 AM

## 2014-02-25 NOTE — Assessment & Plan Note (Signed)
Controlled on current medications 

## 2014-02-25 NOTE — Assessment & Plan Note (Signed)
History of diamondback or rotational atherectomy of her high-grade right popliteal artery lesion with an excellent angiographic clinical and Doppler results. Her most recent lower extremity arterial Doppler studies performed 02/05/14 revealed a right ABI 1.1 with normal popliteal velocities. She denies claudication.

## 2014-02-25 NOTE — Assessment & Plan Note (Signed)
On statin therapy followed by her PCP 

## 2014-02-25 NOTE — Patient Instructions (Signed)
  We will see you back in follow up in 1 year with Dr Gwenlyn Found.   Dr Gwenlyn Found has ordered: 1. Carotid Duplex- This test is an ultrasound of the carotid arteries in your neck. It looks at blood flow through these arteries that supply the brain with blood. Allow one hour for this exam. There are no restrictions or special instructions.

## 2014-02-27 NOTE — Addendum Note (Signed)
Addended byChauncy Lean. on: 02/27/2014 02:41 PM   Modules accepted: Orders

## 2014-03-06 ENCOUNTER — Ambulatory Visit (HOSPITAL_COMMUNITY)
Admission: RE | Admit: 2014-03-06 | Discharge: 2014-03-06 | Disposition: A | Discharge status: Home / Self Care | Payer: Medicare Other | Source: Ambulatory Visit | Attending: Cardiovascular Disease | Admitting: Cardiovascular Disease

## 2014-03-06 DIAGNOSIS — R0989 Other specified symptoms and signs involving the circulatory and respiratory systems: Secondary | ICD-10-CM | POA: Insufficient documentation

## 2014-03-06 DIAGNOSIS — E119 Type 2 diabetes mellitus without complications: Secondary | ICD-10-CM | POA: Insufficient documentation

## 2014-03-06 NOTE — Progress Notes (Signed)
Carotid Duplex Completed. Havanna Groner, BS, RDMS, RVT  

## 2014-03-10 ENCOUNTER — Encounter: Payer: Self-pay | Admitting: *Deleted

## 2014-04-04 DIAGNOSIS — E119 Type 2 diabetes mellitus without complications: Secondary | ICD-10-CM | POA: Diagnosis not present

## 2014-04-04 DIAGNOSIS — Z23 Encounter for immunization: Secondary | ICD-10-CM | POA: Diagnosis not present

## 2014-04-04 DIAGNOSIS — N183 Chronic kidney disease, stage 3 (moderate): Secondary | ICD-10-CM | POA: Diagnosis not present

## 2014-04-04 DIAGNOSIS — R252 Cramp and spasm: Secondary | ICD-10-CM | POA: Diagnosis not present

## 2014-04-04 DIAGNOSIS — I10 Essential (primary) hypertension: Secondary | ICD-10-CM | POA: Diagnosis not present

## 2014-05-05 DIAGNOSIS — H40013 Open angle with borderline findings, low risk, bilateral: Secondary | ICD-10-CM | POA: Diagnosis not present

## 2014-06-02 DIAGNOSIS — I10 Essential (primary) hypertension: Secondary | ICD-10-CM | POA: Diagnosis not present

## 2014-06-02 DIAGNOSIS — E1122 Type 2 diabetes mellitus with diabetic chronic kidney disease: Secondary | ICD-10-CM | POA: Diagnosis not present

## 2014-06-02 DIAGNOSIS — I739 Peripheral vascular disease, unspecified: Secondary | ICD-10-CM | POA: Diagnosis not present

## 2014-06-02 DIAGNOSIS — N183 Chronic kidney disease, stage 3 (moderate): Secondary | ICD-10-CM | POA: Diagnosis not present

## 2014-06-02 DIAGNOSIS — E782 Mixed hyperlipidemia: Secondary | ICD-10-CM | POA: Diagnosis not present

## 2014-06-02 DIAGNOSIS — E039 Hypothyroidism, unspecified: Secondary | ICD-10-CM | POA: Diagnosis not present

## 2014-06-02 DIAGNOSIS — E1151 Type 2 diabetes mellitus with diabetic peripheral angiopathy without gangrene: Secondary | ICD-10-CM | POA: Diagnosis not present

## 2014-06-02 DIAGNOSIS — F17210 Nicotine dependence, cigarettes, uncomplicated: Secondary | ICD-10-CM | POA: Diagnosis not present

## 2014-06-05 ENCOUNTER — Encounter (HOSPITAL_COMMUNITY): Payer: Self-pay | Admitting: Cardiovascular Disease

## 2014-06-25 DIAGNOSIS — N289 Disorder of kidney and ureter, unspecified: Secondary | ICD-10-CM | POA: Diagnosis not present

## 2014-06-25 DIAGNOSIS — E039 Hypothyroidism, unspecified: Secondary | ICD-10-CM | POA: Diagnosis not present

## 2014-06-25 DIAGNOSIS — D638 Anemia in other chronic diseases classified elsewhere: Secondary | ICD-10-CM | POA: Diagnosis not present

## 2014-06-25 DIAGNOSIS — D649 Anemia, unspecified: Secondary | ICD-10-CM | POA: Diagnosis not present

## 2014-06-25 DIAGNOSIS — G4762 Sleep related leg cramps: Secondary | ICD-10-CM | POA: Diagnosis not present

## 2014-06-25 DIAGNOSIS — Z5181 Encounter for therapeutic drug level monitoring: Secondary | ICD-10-CM | POA: Diagnosis not present

## 2014-06-25 DIAGNOSIS — E119 Type 2 diabetes mellitus without complications: Secondary | ICD-10-CM | POA: Diagnosis not present

## 2014-06-25 DIAGNOSIS — D539 Nutritional anemia, unspecified: Secondary | ICD-10-CM | POA: Diagnosis not present

## 2014-06-25 DIAGNOSIS — Z720 Tobacco use: Secondary | ICD-10-CM | POA: Diagnosis not present

## 2014-06-25 DIAGNOSIS — R609 Edema, unspecified: Secondary | ICD-10-CM | POA: Diagnosis not present

## 2014-07-07 ENCOUNTER — Telehealth: Payer: Self-pay | Admitting: Cardiovascular Disease

## 2014-07-07 MED ORDER — CLOPIDOGREL BISULFATE 75 MG PO TABS: 75.0000 mg | ORAL_TABLET | Freq: Every day | ORAL | Status: DC

## 2014-07-07 NOTE — Telephone Encounter (Signed)
Pt need a new prescription for her generic Plavix and 3 refills.[phone].

## 2014-07-07 NOTE — Telephone Encounter (Signed)
Rx(s) sent to pharmacy electronically.  

## 2014-07-16 DIAGNOSIS — E119 Type 2 diabetes mellitus without complications: Secondary | ICD-10-CM | POA: Diagnosis not present

## 2014-08-13 DIAGNOSIS — E039 Hypothyroidism, unspecified: Secondary | ICD-10-CM | POA: Diagnosis not present

## 2014-08-13 DIAGNOSIS — E139 Other specified diabetes mellitus without complications: Secondary | ICD-10-CM | POA: Diagnosis not present

## 2014-08-14 DIAGNOSIS — H40013 Open angle with borderline findings, low risk, bilateral: Secondary | ICD-10-CM | POA: Diagnosis not present

## 2014-10-02 DIAGNOSIS — E139 Other specified diabetes mellitus without complications: Secondary | ICD-10-CM | POA: Diagnosis not present

## 2014-10-02 DIAGNOSIS — I10 Essential (primary) hypertension: Secondary | ICD-10-CM | POA: Diagnosis not present

## 2014-10-02 DIAGNOSIS — I739 Peripheral vascular disease, unspecified: Secondary | ICD-10-CM | POA: Diagnosis not present

## 2014-10-02 DIAGNOSIS — F17210 Nicotine dependence, cigarettes, uncomplicated: Secondary | ICD-10-CM | POA: Diagnosis not present

## 2014-10-02 DIAGNOSIS — E782 Mixed hyperlipidemia: Secondary | ICD-10-CM | POA: Diagnosis not present

## 2014-10-02 DIAGNOSIS — F325 Major depressive disorder, single episode, in full remission: Secondary | ICD-10-CM | POA: Diagnosis not present

## 2014-10-02 DIAGNOSIS — M810 Age-related osteoporosis without current pathological fracture: Secondary | ICD-10-CM | POA: Diagnosis not present

## 2014-10-02 DIAGNOSIS — D638 Anemia in other chronic diseases classified elsewhere: Secondary | ICD-10-CM | POA: Diagnosis not present

## 2014-10-02 DIAGNOSIS — J449 Chronic obstructive pulmonary disease, unspecified: Secondary | ICD-10-CM | POA: Diagnosis not present

## 2014-10-02 DIAGNOSIS — E039 Hypothyroidism, unspecified: Secondary | ICD-10-CM | POA: Diagnosis not present

## 2014-10-02 DIAGNOSIS — R634 Abnormal weight loss: Secondary | ICD-10-CM | POA: Diagnosis not present

## 2014-10-08 DIAGNOSIS — F17200 Nicotine dependence, unspecified, uncomplicated: Secondary | ICD-10-CM | POA: Diagnosis not present

## 2014-10-08 DIAGNOSIS — E119 Type 2 diabetes mellitus without complications: Secondary | ICD-10-CM | POA: Diagnosis not present

## 2014-10-08 DIAGNOSIS — N183 Chronic kidney disease, stage 3 (moderate): Secondary | ICD-10-CM | POA: Diagnosis not present

## 2014-10-08 DIAGNOSIS — I10 Essential (primary) hypertension: Secondary | ICD-10-CM | POA: Diagnosis not present

## 2014-11-11 DIAGNOSIS — E139 Other specified diabetes mellitus without complications: Secondary | ICD-10-CM | POA: Diagnosis not present

## 2014-11-11 DIAGNOSIS — L259 Unspecified contact dermatitis, unspecified cause: Secondary | ICD-10-CM | POA: Diagnosis not present

## 2014-11-11 DIAGNOSIS — Z794 Long term (current) use of insulin: Secondary | ICD-10-CM | POA: Diagnosis not present

## 2014-11-11 DIAGNOSIS — Z720 Tobacco use: Secondary | ICD-10-CM | POA: Diagnosis not present

## 2014-11-11 DIAGNOSIS — L299 Pruritus, unspecified: Secondary | ICD-10-CM | POA: Diagnosis not present

## 2014-11-11 DIAGNOSIS — E039 Hypothyroidism, unspecified: Secondary | ICD-10-CM | POA: Diagnosis not present

## 2014-11-21 DIAGNOSIS — R05 Cough: Secondary | ICD-10-CM | POA: Diagnosis not present

## 2014-11-21 DIAGNOSIS — J209 Acute bronchitis, unspecified: Secondary | ICD-10-CM | POA: Diagnosis not present

## 2014-12-16 DIAGNOSIS — E11329 Type 2 diabetes mellitus with mild nonproliferative diabetic retinopathy without macular edema: Secondary | ICD-10-CM | POA: Diagnosis not present

## 2014-12-16 DIAGNOSIS — H40013 Open angle with borderline findings, low risk, bilateral: Secondary | ICD-10-CM | POA: Diagnosis not present

## 2015-01-29 DIAGNOSIS — E039 Hypothyroidism, unspecified: Secondary | ICD-10-CM | POA: Diagnosis not present

## 2015-01-29 DIAGNOSIS — D638 Anemia in other chronic diseases classified elsewhere: Secondary | ICD-10-CM | POA: Diagnosis not present

## 2015-01-29 DIAGNOSIS — E139 Other specified diabetes mellitus without complications: Secondary | ICD-10-CM | POA: Diagnosis not present

## 2015-01-29 DIAGNOSIS — M810 Age-related osteoporosis without current pathological fracture: Secondary | ICD-10-CM | POA: Diagnosis not present

## 2015-01-29 DIAGNOSIS — I739 Peripheral vascular disease, unspecified: Secondary | ICD-10-CM | POA: Diagnosis not present

## 2015-01-29 DIAGNOSIS — I10 Essential (primary) hypertension: Secondary | ICD-10-CM | POA: Diagnosis not present

## 2015-01-29 DIAGNOSIS — J449 Chronic obstructive pulmonary disease, unspecified: Secondary | ICD-10-CM | POA: Diagnosis not present

## 2015-01-29 DIAGNOSIS — F325 Major depressive disorder, single episode, in full remission: Secondary | ICD-10-CM | POA: Diagnosis not present

## 2015-01-29 DIAGNOSIS — E1165 Type 2 diabetes mellitus with hyperglycemia: Secondary | ICD-10-CM | POA: Diagnosis not present

## 2015-01-29 DIAGNOSIS — Z139 Encounter for screening, unspecified: Secondary | ICD-10-CM | POA: Diagnosis not present

## 2015-01-29 DIAGNOSIS — N183 Chronic kidney disease, stage 3 (moderate): Secondary | ICD-10-CM | POA: Diagnosis not present

## 2015-01-29 DIAGNOSIS — E782 Mixed hyperlipidemia: Secondary | ICD-10-CM | POA: Diagnosis not present

## 2015-01-29 DIAGNOSIS — F17210 Nicotine dependence, cigarettes, uncomplicated: Secondary | ICD-10-CM | POA: Diagnosis not present

## 2015-02-02 ENCOUNTER — Other Ambulatory Visit: Payer: Self-pay

## 2015-02-02 DIAGNOSIS — Z1231 Encounter for screening mammogram for malignant neoplasm of breast: Secondary | ICD-10-CM

## 2015-02-10 ENCOUNTER — Ambulatory Visit
Admission: RE | Admit: 2015-02-10 | Discharge: 2015-02-10 | Disposition: A | Discharge status: Home / Self Care | Payer: Medicare Other | Source: Ambulatory Visit

## 2015-02-10 DIAGNOSIS — Z1231 Encounter for screening mammogram for malignant neoplasm of breast: Secondary | ICD-10-CM | POA: Diagnosis not present

## 2015-02-23 NOTE — Telephone Encounter (Signed)
Error

## 2015-03-10 ENCOUNTER — Ambulatory Visit (INDEPENDENT_AMBULATORY_CARE_PROVIDER_SITE_OTHER): Payer: Medicare Other | Admitting: Cardiovascular Disease

## 2015-03-10 ENCOUNTER — Encounter: Payer: Self-pay | Admitting: Cardiovascular Disease

## 2015-03-10 VITALS — BP 140/58 | HR 65 | Ht 65.0 in | Wt 115.0 lb

## 2015-03-10 DIAGNOSIS — I739 Peripheral vascular disease, unspecified: Secondary | ICD-10-CM | POA: Diagnosis not present

## 2015-03-10 DIAGNOSIS — E785 Hyperlipidemia, unspecified: Secondary | ICD-10-CM | POA: Diagnosis not present

## 2015-03-10 DIAGNOSIS — I10 Essential (primary) hypertension: Secondary | ICD-10-CM | POA: Diagnosis not present

## 2015-03-10 NOTE — Progress Notes (Signed)
03/10/2015 [name]   1945-10-06  [id number]  Primary Physician Kandice Hams, MD Primary Cardiologist: Lorretta Harp MD Renae Gloss   HPI:  Ms. Frane is a 69 year old thin appearing widowed Caucasian female no children and who lives alone. She was referred by Dr. Ila Mcgill at [facility] for evaluation and treatment of claudication. Her primary care physician is Dr. Alta Corning at [facility]. Her cardiovascular risk factors include a 50-pack-year history tobacco abuse currently smoking one pack per day, 2 hypertension, diabetes and hyperlipidemia. Her father died suddenly of microinfarction at age 7. She has never had a heart attack or stroke patient is compared to sleep denies chest pain. She complains of right calf claudication with recent arterial Dopplers performed in our office on 04/25/13 revealing a right ABI 0.93 with a high-frequency signal in the right popliteal artery. She underwent angiography on 12/9 revealing a high-grade right popliteal artery stenosis with three-vessel runoff. Because of moderate renal deficiency her intervention was staged until today.I performed angiography and intervention on her 2 /2/15. She had a 99% calcified lesion in the right popliteal artery but I performed diamondback orbital rotational atherectomy on. Her symptoms of claudication resolved after her procedure and her Dopplers normalized. Her most recent Dopplers performed 02/05/14 revealed ABIs of 1 bilaterally with a widely patent right popliteal artery.   Current Outpatient Prescriptions  Medication Sig Dispense Refill  . albuterol (PROVENTIL HFA;VENTOLIN HFA) 108 (90 BASE) MCG/ACT inhaler Inhale into the lungs every 6 (six) hours as needed for wheezing or shortness of breath.    [name] aspirin EC 81 MG tablet Take 81 mg by mouth daily.    [name] atorvastatin (LIPITOR) 20 MG tablet Take 20 mg by mouth daily at 6 PM.     . budesonide-formoterol (SYMBICORT) 160-4.5 MCG/ACT  inhaler Inhale 2 puffs into the lungs 2 (two) times daily.    . Calcium Carb-Cholecalciferol (CALCIUM 600/VITAMIN D3) 600-800 MG-UNIT TABS Take 1 tablet by mouth daily.     . clopidogrel (PLAVIX) 75 MG tablet Take 1 tablet (75 mg total) by mouth daily with breakfast. 90 tablet 2  . hydrochlorothiazide (HYDRODIURIL) 25 MG tablet Take 25 mg by mouth daily.  2  . Insulin Glargine (LANTUS SOLOSTAR) 100 UNIT/ML Solostar Pen Inject 10-11 Units into the skin at bedtime. 11 units for blood sugar < 400 and 11+ units for blood sugar >= 400    . levothyroxine (SYNTHROID, LEVOTHROID) 112 MCG tablet Take 112 mcg by mouth daily before breakfast.    . NOVOLOG FLEXPEN 100 UNIT/ML FlexPen 4 Units 3 (three) times daily.  11  . ramipril (ALTACE) 10 MG capsule Take 10 mg by mouth 2 (two) times daily.      No current facility-administered medications for this visit.    Allergies  Allergen Reactions  . Adhesive [Tape] Itching  . Amoxicillin Hives, Itching and Swelling  . Latex Itching    Social History   Social History  . Marital Status: Widowed    Spouse Name: N/A  . Number of Children: N/A  . Years of Education: N/A   Occupational History  . Not on file.   Social History Main Topics  . Smoking status: Current Every Day Smoker -- 0.75 packs/day for 50 years    Types: Cigarettes  . Smokeless tobacco: Never Used  . Alcohol Use: No  . Drug Use: No  . Sexual Activity: No   Other Topics Concern  . Not on file   Social  History Narrative     Review of Systems: General: negative for chills, fever, night sweats or weight changes.  Cardiovascular: negative for chest pain, dyspnea on exertion, edema, orthopnea, palpitations, paroxysmal nocturnal dyspnea or shortness of breath Dermatological: negative for rash Respiratory: negative for cough or wheezing Urologic: negative for hematuria Abdominal: negative for nausea, vomiting, diarrhea, bright red blood per rectum, melena, or  hematemesis Neurologic: negative for visual changes, syncope, or dizziness All other systems reviewed and are otherwise negative except as noted above.    Blood pressure 140/58, pulse 65, height 5\' 5"  (1.651 m), weight 115 lb (52.164 kg).  General appearance: alert and no distress Neck: no adenopathy, no carotid bruit, no JVD, supple, symmetrical, trachea midline and thyroid not enlarged, symmetric, no tenderness/mass/nodules Lungs: clear to auscultation bilaterally Heart: regular rate and rhythm, S1, S2 normal, no murmur, click, rub or gallop Extremities: extremities normal, atraumatic, no cyanosis or edema Pulses: 2+ and symmetric Skin: Skin color, texture, turgor normal. No rashes or lesions Neurologic: Grossly normal  EKG normal sinus rhythm at 65 without ST or T-wave changes. I personally reviewed this EKG  ASSESSMENT AND PLAN:   Tobacco abuse Continued tobacco abuse recalcitrant to risk factor modification  PAD (peripheral artery disease) History of peripheral arterial disease status post diamondback orbital rotational atherectomy of high-grade calcified right popliteal artery stenosis 07/29/13 result tingling improvement in her claudication and Doppler studies. Her most recent laboratory Dopplers performed 02/05/14 revealed ABIs of 1 bilaterally with a widely patent right popliteal artery. She continues to deny claudication. We will follow-up her Doppler studies.  Hyperlipidemia History of hyperlipidemia on atorvastatin 20 mg a day followed by her PCP  Essential hypertension History of hypertension with blood pressure measured at 140/58. She is on hydrochlorothiazide and enalapril. Continue current meds at current dosing      Lorretta Harp MD [facility], [facility] 03/10/2015 10:39 AM

## 2015-03-10 NOTE — Assessment & Plan Note (Signed)
History of peripheral arterial disease status post diamondback orbital rotational atherectomy of high-grade calcified right popliteal artery stenosis 07/29/13 result tingling improvement in her claudication and Doppler studies. Her most recent laboratory Dopplers performed 02/05/14 revealed ABIs of 1 bilaterally with a widely patent right popliteal artery. She continues to deny claudication. We will follow-up her Doppler studies.

## 2015-03-10 NOTE — Assessment & Plan Note (Signed)
History of hyperlipidemia on atorvastatin 20 mg a day followed by her PCP 

## 2015-03-10 NOTE — Assessment & Plan Note (Signed)
History of hypertension with blood pressure measured at 140/58. She is on hydrochlorothiazide and enalapril. Continue current meds at current dosing

## 2015-03-10 NOTE — Patient Instructions (Signed)
Medication Instructions:  Your physician recommends that you continue on your current medications as directed. Please refer to the Current Medication list given to you today.   Labwork: NONE  Testing/Procedures: Your physician has requested that you have a lower extremity arterial doppler- During this test, ultrasound is used to evaluate arterial blood flow in the legs. Allow approximately one hour for this exam.    Follow-Up: Your physician wants you to follow-up in: 12 months with Dr. Gwenlyn Found. You will receive a reminder letter in the mail two months in advance. If you don't receive a letter, please call our office to schedule the follow-up appointment.   Any Other Special Instructions Will Be Listed Below (If Applicable).

## 2015-03-10 NOTE — Assessment & Plan Note (Signed)
Continued tobacco abuse recalcitrant to risk factor modification 

## 2015-03-12 ENCOUNTER — Other Ambulatory Visit: Payer: Self-pay | Admitting: Cardiovascular Disease

## 2015-03-12 DIAGNOSIS — I739 Peripheral vascular disease, unspecified: Secondary | ICD-10-CM

## 2015-03-18 ENCOUNTER — Ambulatory Visit (HOSPITAL_COMMUNITY)
Admission: RE | Admit: 2015-03-18 | Discharge: 2015-03-18 | Disposition: A | Discharge status: Home / Self Care | Payer: Medicare Other | Source: Ambulatory Visit | Attending: Cardiovascular Disease | Admitting: Cardiovascular Disease

## 2015-03-18 DIAGNOSIS — E785 Hyperlipidemia, unspecified: Secondary | ICD-10-CM | POA: Diagnosis not present

## 2015-03-18 DIAGNOSIS — I10 Essential (primary) hypertension: Secondary | ICD-10-CM | POA: Insufficient documentation

## 2015-03-18 DIAGNOSIS — I739 Peripheral vascular disease, unspecified: Secondary | ICD-10-CM | POA: Insufficient documentation

## 2015-03-18 DIAGNOSIS — E119 Type 2 diabetes mellitus without complications: Secondary | ICD-10-CM | POA: Insufficient documentation

## 2015-03-25 DIAGNOSIS — Z23 Encounter for immunization: Secondary | ICD-10-CM | POA: Diagnosis not present

## 2015-03-30 ENCOUNTER — Telehealth: Payer: Self-pay | Admitting: Cardiovascular Disease

## 2015-03-30 ENCOUNTER — Other Ambulatory Visit: Payer: Self-pay

## 2015-03-30 NOTE — Telephone Encounter (Signed)
Mrs. Gemme is changing her Pharmacy and need a new prescription sent  (Generic for Plavix -Clopidogrel 75mg ) to [facility] . Please call if you have any questions   Thanks

## 2015-03-31 MED ORDER — CLOPIDOGREL BISULFATE 75 MG PO TABS: 75.0000 mg | ORAL_TABLET | Freq: Every day | ORAL | Status: DC

## 2015-03-31 NOTE — Telephone Encounter (Signed)
Rx(s) sent to pharmacy electronically. Patient notified of refill  

## 2015-04-06 ENCOUNTER — Other Ambulatory Visit: Payer: Self-pay | Admitting: Cardiovascular Disease

## 2015-04-06 NOTE — Telephone Encounter (Signed)
REFILL 

## 2015-04-27 ENCOUNTER — Encounter: Payer: Self-pay | Admitting: Cardiovascular Disease

## 2015-05-01 ENCOUNTER — Encounter: Payer: Self-pay | Admitting: Interventional Cardiology

## 2015-05-18 DIAGNOSIS — Z720 Tobacco use: Secondary | ICD-10-CM | POA: Diagnosis not present

## 2015-05-18 DIAGNOSIS — Z794 Long term (current) use of insulin: Secondary | ICD-10-CM | POA: Diagnosis not present

## 2015-05-18 DIAGNOSIS — E139 Other specified diabetes mellitus without complications: Secondary | ICD-10-CM | POA: Diagnosis not present

## 2015-05-18 DIAGNOSIS — E039 Hypothyroidism, unspecified: Secondary | ICD-10-CM | POA: Diagnosis not present

## 2015-06-03 DIAGNOSIS — E119 Type 2 diabetes mellitus without complications: Secondary | ICD-10-CM | POA: Diagnosis not present

## 2015-06-03 DIAGNOSIS — H40012 Open angle with borderline findings, low risk, left eye: Secondary | ICD-10-CM | POA: Diagnosis not present

## 2015-06-03 DIAGNOSIS — H40011 Open angle with borderline findings, low risk, right eye: Secondary | ICD-10-CM | POA: Diagnosis not present

## 2015-06-03 DIAGNOSIS — Z961 Presence of intraocular lens: Secondary | ICD-10-CM | POA: Diagnosis not present

## 2015-07-08 DIAGNOSIS — N183 Chronic kidney disease, stage 3 (moderate): Secondary | ICD-10-CM | POA: Diagnosis not present

## 2015-07-08 DIAGNOSIS — E039 Hypothyroidism, unspecified: Secondary | ICD-10-CM | POA: Diagnosis not present

## 2015-07-08 DIAGNOSIS — E782 Mixed hyperlipidemia: Secondary | ICD-10-CM | POA: Diagnosis not present

## 2015-07-08 DIAGNOSIS — Z1389 Encounter for screening for other disorder: Secondary | ICD-10-CM | POA: Diagnosis not present

## 2015-07-08 DIAGNOSIS — M810 Age-related osteoporosis without current pathological fracture: Secondary | ICD-10-CM | POA: Diagnosis not present

## 2015-07-08 DIAGNOSIS — I10 Essential (primary) hypertension: Secondary | ICD-10-CM | POA: Diagnosis not present

## 2015-07-08 DIAGNOSIS — J449 Chronic obstructive pulmonary disease, unspecified: Secondary | ICD-10-CM | POA: Diagnosis not present

## 2015-07-08 DIAGNOSIS — E139 Other specified diabetes mellitus without complications: Secondary | ICD-10-CM | POA: Diagnosis not present

## 2015-07-08 DIAGNOSIS — E1121 Type 2 diabetes mellitus with diabetic nephropathy: Secondary | ICD-10-CM | POA: Diagnosis not present

## 2015-07-08 DIAGNOSIS — I739 Peripheral vascular disease, unspecified: Secondary | ICD-10-CM | POA: Diagnosis not present

## 2015-07-08 DIAGNOSIS — Z0000 Encounter for general adult medical examination without abnormal findings: Secondary | ICD-10-CM | POA: Diagnosis not present

## 2015-07-08 DIAGNOSIS — Z794 Long term (current) use of insulin: Secondary | ICD-10-CM | POA: Diagnosis not present

## 2015-08-20 DIAGNOSIS — E039 Hypothyroidism, unspecified: Secondary | ICD-10-CM | POA: Diagnosis not present

## 2015-09-21 DIAGNOSIS — N183 Chronic kidney disease, stage 3 (moderate): Secondary | ICD-10-CM | POA: Diagnosis not present

## 2015-10-02 DIAGNOSIS — I10 Essential (primary) hypertension: Secondary | ICD-10-CM | POA: Diagnosis not present

## 2015-10-02 DIAGNOSIS — F17200 Nicotine dependence, unspecified, uncomplicated: Secondary | ICD-10-CM | POA: Diagnosis not present

## 2015-10-02 DIAGNOSIS — N183 Chronic kidney disease, stage 3 (moderate): Secondary | ICD-10-CM | POA: Diagnosis not present

## 2015-10-11 ENCOUNTER — Encounter (HOSPITAL_COMMUNITY): Payer: Self-pay | Admitting: Emergency Medicine

## 2015-10-11 ENCOUNTER — Emergency Department (HOSPITAL_COMMUNITY)
Admission: EM | Admit: 2015-10-11 | Discharge: 2015-10-11 | Disposition: A | Discharge status: Home / Self Care | Payer: Medicare Other | Attending: Emergency Medicine | Admitting: Emergency Medicine

## 2015-10-11 ENCOUNTER — Emergency Department (HOSPITAL_COMMUNITY): Payer: Medicare Other

## 2015-10-11 DIAGNOSIS — I129 Hypertensive chronic kidney disease with stage 1 through stage 4 chronic kidney disease, or unspecified chronic kidney disease: Secondary | ICD-10-CM | POA: Insufficient documentation

## 2015-10-11 DIAGNOSIS — Z91040 Latex allergy status: Secondary | ICD-10-CM | POA: Insufficient documentation

## 2015-10-11 DIAGNOSIS — M1990 Unspecified osteoarthritis, unspecified site: Secondary | ICD-10-CM | POA: Diagnosis not present

## 2015-10-11 DIAGNOSIS — E119 Type 2 diabetes mellitus without complications: Secondary | ICD-10-CM | POA: Diagnosis not present

## 2015-10-11 DIAGNOSIS — R079 Chest pain, unspecified: Secondary | ICD-10-CM | POA: Diagnosis not present

## 2015-10-11 DIAGNOSIS — E785 Hyperlipidemia, unspecified: Secondary | ICD-10-CM | POA: Diagnosis not present

## 2015-10-11 DIAGNOSIS — Z880 Allergy status to penicillin: Secondary | ICD-10-CM | POA: Diagnosis not present

## 2015-10-11 DIAGNOSIS — J45909 Unspecified asthma, uncomplicated: Secondary | ICD-10-CM | POA: Insufficient documentation

## 2015-10-11 DIAGNOSIS — F17210 Nicotine dependence, cigarettes, uncomplicated: Secondary | ICD-10-CM | POA: Diagnosis not present

## 2015-10-11 DIAGNOSIS — E039 Hypothyroidism, unspecified: Secondary | ICD-10-CM | POA: Insufficient documentation

## 2015-10-11 DIAGNOSIS — M810 Age-related osteoporosis without current pathological fracture: Secondary | ICD-10-CM | POA: Insufficient documentation

## 2015-10-11 DIAGNOSIS — N189 Chronic kidney disease, unspecified: Secondary | ICD-10-CM | POA: Diagnosis not present

## 2015-10-11 DIAGNOSIS — Z794 Long term (current) use of insulin: Secondary | ICD-10-CM | POA: Insufficient documentation

## 2015-10-11 DIAGNOSIS — Z7902 Long term (current) use of antithrombotics/antiplatelets: Secondary | ICD-10-CM | POA: Diagnosis not present

## 2015-10-11 DIAGNOSIS — E079 Disorder of thyroid, unspecified: Secondary | ICD-10-CM | POA: Diagnosis not present

## 2015-10-11 DIAGNOSIS — R0789 Other chest pain: Secondary | ICD-10-CM | POA: Diagnosis not present

## 2015-10-11 DIAGNOSIS — R001 Bradycardia, unspecified: Secondary | ICD-10-CM | POA: Diagnosis not present

## 2015-10-11 DIAGNOSIS — Z7982 Long term (current) use of aspirin: Secondary | ICD-10-CM | POA: Diagnosis not present

## 2015-10-11 LAB — BASIC METABOLIC PANEL
Anion gap: 11 (ref 5–15)
BUN: 44 mg/dL — AB (ref 6–20)
CALCIUM: 9.1 mg/dL (ref 8.9–10.3)
CO2: 27 mmol/L (ref 22–32)
Chloride: 98 mmol/L — ABNORMAL LOW (ref 101–111)
Creatinine, Ser: 1.69 mg/dL — ABNORMAL HIGH (ref 0.44–1.00)
GFR calc Af Amer: 34 mL/min — ABNORMAL LOW (ref >60)
GFR, EST NON AFRICAN AMERICAN: 29 mL/min — AB (ref >60)
GLUCOSE: 136 mg/dL — AB (ref 65–99)
Potassium: 4 mmol/L (ref 3.5–5.1)
Sodium: 136 mmol/L (ref 135–145)

## 2015-10-11 LAB — I-STAT TROPONIN, ED
Troponin i, poc: 0.01 ng/mL (ref 0.00–0.08)
Troponin i, poc: 0.01 ng/mL (ref 0.00–0.08)

## 2015-10-11 LAB — CBC WITH DIFFERENTIAL/PLATELET
BASOS PCT: 1 %
Basophils Absolute: 0.1 10*3/uL (ref 0.0–0.1)
EOS PCT: 1 %
Eosinophils Absolute: 0.1 10*3/uL (ref 0.0–0.7)
HCT: 32.6 % — ABNORMAL LOW (ref 36.0–46.0)
Hemoglobin: 10.7 g/dL — ABNORMAL LOW (ref 12.0–15.0)
LYMPHS PCT: 20 %
Lymphs Abs: 1.1 10*3/uL (ref 0.7–4.0)
MCH: 28.6 pg (ref 26.0–34.0)
MCHC: 32.8 g/dL (ref 30.0–36.0)
MCV: 87.2 fL (ref 78.0–100.0)
MONO ABS: 0.5 10*3/uL (ref 0.1–1.0)
Monocytes Relative: 8 %
Neutro Abs: 3.9 10*3/uL (ref 1.7–7.7)
Neutrophils Relative %: 70 %
PLATELETS: 294 10*3/uL (ref 150–400)
RBC: 3.74 MIL/uL — ABNORMAL LOW (ref 3.87–5.11)
RDW: 14.4 % (ref 11.5–15.5)
WBC: 5.6 10*3/uL (ref 4.0–10.5)

## 2015-10-11 MED ORDER — ASPIRIN 81 MG PO CHEW
324.0000 mg | CHEWABLE_TABLET | Freq: Once | ORAL | Status: AC
  Administered 2015-10-11: 324 mg via ORAL
  Filled 2015-10-11: qty 4

## 2015-10-11 NOTE — ED Provider Notes (Signed)
CSN: [id number]     Arrival date & time 10/11/15  0805 History   First MD Initiated Contact with Patient 10/11/15 [phone]     Chief Complaint  Patient presents with  . Chest Pain     Patient is a 70 y.o. female presenting with chest pain. The history is provided by the patient. No language interpreter was used.  Chest Pain  IMO ALKHATIB is a 70 y.o. female who presents to the Emergency Department complaining of chest pain.  She awoke this morning at 6:30 with a pain in her left shoulder that radiates to her left upper chest wall. The pain is sharp and stinging in nature. Episodes last for a second and the resolved. Episodes recur every few minutes. She denies any associated fevers, nausea, vomiting, diaphoresis, cough, abdominal pain. Pain does not change with positioning, range of motion, activity. She has a history of COPD and chronic shortness of breath. She also has a history of peripheral vascular disease, diabetes, chronic kidney disease, hypertension. She has no personal history of cardiac disease but she does have a family history of cardiac disease and she is a smoker.  Past Medical History  Diagnosis Date  . Thyroid disease   . Hypertension   . Asthma   . Osteoporosis   . Hyperlipidemia   . Claudication ([place])   . PAD (peripheral artery disease) ([place])     a. 07/29/13: s/p PV angiogram with successful diamondback orbital rotational atherectomy of the high-grade calcified R popliteal artery stenosis  . Family history of anesthesia complication   . Chronic bronchitis ([place])   . Shortness of breath   . Hypothyroidism   . Type II diabetes mellitus ([place])   . Arthritis   . Chronic kidney disease    Past Surgical History  Procedure Laterality Date  . Joint replacement    . Atherectomy Right 07/29/2013    popliteal/notes 07/29/2013  . Cholecystectomy  1980's?  . Hip fracture surgery Left 2000's    "I broke the ball; dr broke femur during OR; got a rod in there" 92/07/2013)  . Lower  extremity angiogram N/A 06/04/2013    Procedure: LOWER EXTREMITY ANGIOGRAM;  Surgeon: Lorretta Harp, MD;  Location: [facility] CATH LAB;  Service: Cardiovascular;  Laterality: N/A;  . Abdominal aortagram  06/04/2013    Procedure: ABDOMINAL AORTAGRAM;  Surgeon: Lorretta Harp, MD;  Location: [facility] CATH LAB;  Service: Cardiovascular;;   Family History  Problem Relation Age of Onset  . Heart disease Father    Social History  Substance Use Topics  . Smoking status: Current Every Day Smoker -- 1.00 packs/day for 50 years    Types: Cigarettes  . Smokeless tobacco: Never Used  . Alcohol Use: No   OB History    No data available     Review of Systems  Cardiovascular: Positive for chest pain.  All other systems reviewed and are negative.     Allergies  Adhesive; Amoxicillin; and Latex  Home Medications   Prior to Admission medications   Medication Sig Start Date End Date Taking? Authorizing Provider  albuterol (PROVENTIL HFA;VENTOLIN HFA) 108 (90 BASE) MCG/ACT inhaler Inhale into the lungs every 6 (six) hours as needed for wheezing or shortness of breath.   Yes Historical Provider, MD  aspirin EC 81 MG tablet Take 81 mg by mouth daily.   Yes Historical Provider, MD  atorvastatin (LIPITOR) 20 MG tablet Take 20 mg by mouth every morning.  01/30/13  Yes Historical Provider,  MD  budesonide-formoterol (SYMBICORT) 160-4.5 MCG/ACT inhaler Inhale 2 puffs into the lungs 2 (two) times daily.   Yes Historical Provider, MD  Calcium Carb-Cholecalciferol (CALCIUM 600/VITAMIN D3) 600-800 MG-UNIT TABS Take 1 tablet by mouth daily.    Yes Historical Provider, MD  clopidogrel (PLAVIX) 75 MG tablet TAKE 1 TABLET (75 MG TOTAL) BY MOUTH DAILY WITH BREAKFAST. 04/06/15  Yes Lorretta Harp, MD  hydrochlorothiazide (HYDRODIURIL) 25 MG tablet Take 25 mg by mouth daily. 12/15/14  Yes Historical Provider, MD  Insulin Glargine (LANTUS SOLOSTAR) 100 UNIT/ML Solostar Pen Inject 10 Units into the skin daily at 12 noon. 11  units for blood sugar < 400 and 11+ units for blood sugar >= 400   Yes Historical Provider, MD  levothyroxine (SYNTHROID, LEVOTHROID) 112 MCG tablet Take 100 mcg by mouth daily before breakfast.    Yes Historical Provider, MD  NOVOLOG FLEXPEN 100 UNIT/ML FlexPen Inject 4 Units into the skin 3 (three) times daily.  12/23/14  Yes Historical Provider, MD  ramipril (ALTACE) 10 MG capsule Take 10 mg by mouth 2 (two) times daily.  01/30/13  Yes Historical Provider, MD  sodium chloride (OCEAN) 0.65 % SOLN nasal spray Place 1 spray into both nostrils as needed for congestion.   Yes Historical Provider, MD   BP 156/54 mmHg  Pulse 47  Temp(Src) 97.8 F (36.6 C) (Oral)  Resp 18  Ht 5\' 5"  (1.651 m)  Wt 118 lb (53.524 kg)  BMI 19.64 kg/m2  SpO2 98% Physical Exam  Constitutional: She is oriented to person, place, and time. She appears well-developed and well-nourished.  HENT:  Head: Normocephalic and atraumatic.  Cardiovascular: Regular rhythm.   No murmur heard. bradycardic  Pulmonary/Chest: Effort normal and breath sounds normal. No respiratory distress. She exhibits no tenderness.  Abdominal: Soft. There is no tenderness. There is no rebound and no guarding.  Musculoskeletal: She exhibits no edema or tenderness.  2+ radial pulses bilaterally.  Neurological: She is alert and oriented to person, place, and time.  Skin: Skin is warm and dry.  Psychiatric: She has a normal mood and affect. Her behavior is normal.  Nursing note and vitals reviewed.   ED Course  Procedures (including critical care time) Labs Review Labs Reviewed  BASIC METABOLIC PANEL - Abnormal; Notable for the following:    Chloride 98 (*)    Glucose, Bld 136 (*)    BUN 44 (*)    Creatinine, Ser 1.69 (*)    GFR calc non Af Amer 29 (*)    GFR calc Af Amer 34 (*)    All other components within normal limits  CBC WITH DIFFERENTIAL/PLATELET - Abnormal; Notable for the following:    RBC 3.74 (*)    Hemoglobin 10.7 (*)    HCT  32.6 (*)    All other components within normal limits  I-STAT TROPOININ, ED  Randolm Idol, ED    Imaging Review Dg Chest 2 View  10/11/2015  CLINICAL DATA:  70 year old female with history of left-sided stinging chest pain for the past 3 hours. History of COPD, diabetes and hypertension. Current smoker. EXAM: CHEST  2 VIEW COMPARISON:  Chest x-ray 05/29/2013. FINDINGS: Lungs appear hyperexpanded with flattening of the hemidiaphragms, increased retrosternal air space and pruning of the pulmonary vasculature in the periphery, suggestive of underlying COPD. Bibasilar pleural-parenchymal thickening, similar to prior studies. No definite pleural effusions. No evidence of pulmonary edema. No suspicious appearing pulmonary nodules or masses. No pneumothorax. Heart size is normal. Upper mediastinal contours  are within normal limits. Atherosclerosis in the thoracic aorta. Old healed fracture of the posterior aspect of the right tenth rib incidentally noted. IMPRESSION: 1. Chronic changes suggestive of COPD, as above. No radiographic evidence of acute cardiopulmonary disease. 2. Atherosclerosis. Electronically Signed   By: Vinnie Langton M.D.   On: 10/11/2015 09:20   I have personally reviewed and evaluated these images and lab results as part of my medical decision-making.   EKG Interpretation   Date/Time:  Sunday October 11 2015 08:16:19 EDT Ventricular Rate:  61 PR Interval:  154 QRS Duration: 101 QT Interval:  425 QTC Calculation: 428 R Axis:   95 Text Interpretation:  Sinus rhythm Consider right ventricular hypertrophy  Probable inferior infarct, old Confirmed by Hazle Coca [phone]) on 10/11/2015  8:19:27 AM      MDM   Final diagnoses:  Chest pain, unspecified chest pain type   Pt here for evaluation of atypical chest pain.  Presentation not c/w ACS, PE, dissection, pna.  Pt does have multiple cardiac risk factors.  Discussed home care, close Cardiology follow up, return precautions.       Quintella Reichert, MD 10/11/15 [id number]

## 2015-10-11 NOTE — ED Notes (Signed)
Pt started having chest pain around 6am today. Pt states it feels like a "zing" and then it's gone. Denies SOB, N/V.

## 2015-10-11 NOTE — Discharge Instructions (Signed)
Nonspecific Chest Pain  °Chest pain can be caused by many different conditions. There is always a chance that your pain could be related to something serious, such as a heart attack or a blood clot in your lungs. Chest pain can also be caused by conditions that are not life-threatening. If you have chest pain, it is very important to follow up with your health care provider. °CAUSES  °Chest pain can be caused by: °· Heartburn. °· Pneumonia or bronchitis. °· Anxiety or stress. °· Inflammation around your heart (pericarditis) or lung (pleuritis or pleurisy). °· A blood clot in your lung. °· A collapsed lung (pneumothorax). It can develop suddenly on its own (spontaneous pneumothorax) or from trauma to the chest. °· Shingles infection (varicella-zoster virus). °· Heart attack. °· Damage to the bones, muscles, and cartilage that make up your chest wall. This can include: °¨ Bruised bones due to injury. °¨ Strained muscles or cartilage due to frequent or repeated coughing or overwork. °¨ Fracture to one or more ribs. °¨ Sore cartilage due to inflammation (costochondritis). °RISK FACTORS  °Risk factors for chest pain may include: °· Activities that increase your risk for trauma or injury to your chest. °· Respiratory infections or conditions that cause frequent coughing. °· Medical conditions or overeating that can cause heartburn. °· Heart disease or family history of heart disease. °· Conditions or health behaviors that increase your risk of developing a blood clot. °· Having had chicken pox (varicella zoster). °SIGNS AND SYMPTOMS °Chest pain can feel like: °· Burning or tingling on the surface of your chest or deep in your chest. °· Crushing, pressure, aching, or squeezing pain. °· Dull or sharp pain that is worse when you move, cough, or take a deep breath. °· Pain that is also felt in your back, neck, shoulder, or arm, or pain that spreads to any of these areas. °Your chest pain may come and go, or it may stay  constant. °DIAGNOSIS °Lab tests or other studies may be needed to find the cause of your pain. Your health care provider may have you take a test called an ambulatory ECG (electrocardiogram). An ECG records your heartbeat patterns at the time the test is performed. You may also have other tests, such as: °· Transthoracic echocardiogram (TTE). During echocardiography, sound waves are used to create a picture of all of the heart structures and to look at how blood flows through your heart. °· Transesophageal echocardiogram (TEE). This is a more advanced imaging test that obtains images from inside your body. It allows your health care provider to see your heart in finer detail. °· Cardiac monitoring. This allows your health care provider to monitor your heart rate and rhythm in real time. °· Holter monitor. This is a portable device that records your heartbeat and can help to diagnose abnormal heartbeats. It allows your health care provider to track your heart activity for several days, if needed. °· Stress tests. These can be done through exercise or by taking medicine that makes your heart beat more quickly. °· Blood tests. °· Imaging tests. °TREATMENT  °Your treatment depends on what is causing your chest pain. Treatment may include: °· Medicines. These may include: °¨ Acid blockers for heartburn. °¨ Anti-inflammatory medicine. °¨ Pain medicine for inflammatory conditions. °¨ Antibiotic medicine, if an infection is present. °¨ Medicines to dissolve blood clots. °¨ Medicines to treat coronary artery disease. °· Supportive care for conditions that do not require medicines. This may include: °¨ Resting. °¨ Applying heat   or cold packs to injured areas. °¨ Limiting activities until pain decreases. °HOME CARE INSTRUCTIONS °· If you were prescribed an antibiotic medicine, finish it all even if you start to feel better. °· Avoid any activities that bring on chest pain. °· Do not use any tobacco products, including  cigarettes, chewing tobacco, or electronic cigarettes. If you need help quitting, ask your health care provider. °· Do not drink alcohol. °· Take medicines only as directed by your health care provider. °· Keep all follow-up visits as directed by your health care provider. This is important. This includes any further testing if your chest pain does not go away. °· If heartburn is the cause for your chest pain, you may be told to keep your head raised (elevated) while sleeping. This reduces the chance that acid will go from your stomach into your esophagus. °· Make lifestyle changes as directed by your health care provider. These may include: °¨ Getting regular exercise. Ask your health care provider to suggest some activities that are safe for you. °¨ Eating a heart-healthy diet. A registered dietitian can help you to learn healthy eating options. °¨ Maintaining a healthy weight. °¨ Managing diabetes, if necessary. °¨ Reducing stress. °SEEK MEDICAL CARE IF: °· Your chest pain does not go away after treatment. °· You have a rash with blisters on your chest. °· You have a fever. °SEEK IMMEDIATE MEDICAL CARE IF:  °· Your chest pain is worse. °· You have an increasing cough, or you cough up blood. °· You have severe abdominal pain. °· You have severe weakness. °· You faint. °· You have chills. °· You have sudden, unexplained chest discomfort. °· You have sudden, unexplained discomfort in your arms, back, neck, or jaw. °· You have shortness of breath at any time. °· You suddenly start to sweat, or your skin gets clammy. °· You feel nauseous or you vomit. °· You suddenly feel light-headed or dizzy. °· Your heart begins to beat quickly, or it feels like it is skipping beats. °These symptoms may represent a serious problem that is an emergency. Do not wait to see if the symptoms will go away. Get medical help right away. Call your local emergency services (911 in the [place] Do not drive yourself to the hospital. °  °This  information is not intended to replace advice given to you by your health care provider. Make sure you discuss any questions you have with your health care provider. °  °Document Released: 03/23/2005 Document Revised: 07/04/2014 Document Reviewed: 01/17/2014 °Elsevier Interactive Patient Education ©2016 [employer] ° °

## 2015-10-13 ENCOUNTER — Ambulatory Visit (INDEPENDENT_AMBULATORY_CARE_PROVIDER_SITE_OTHER): Payer: Medicare Other | Admitting: Cardiovascular Disease

## 2015-10-13 ENCOUNTER — Encounter: Payer: Self-pay | Admitting: Cardiovascular Disease

## 2015-10-13 VITALS — BP 150/52 | HR 68 | Ht 65.0 in | Wt 112.2 lb

## 2015-10-13 DIAGNOSIS — I10 Essential (primary) hypertension: Secondary | ICD-10-CM

## 2015-10-13 DIAGNOSIS — I739 Peripheral vascular disease, unspecified: Secondary | ICD-10-CM

## 2015-10-13 DIAGNOSIS — Z720 Tobacco use: Secondary | ICD-10-CM | POA: Diagnosis not present

## 2015-10-13 DIAGNOSIS — E785 Hyperlipidemia, unspecified: Secondary | ICD-10-CM | POA: Diagnosis not present

## 2015-10-13 NOTE — Assessment & Plan Note (Signed)
History of hypertension blood pressure measured at 150/52. She is on hydrochlorothiazide and ramipril. Continue current meds at current dosing

## 2015-10-13 NOTE — Assessment & Plan Note (Signed)
History of ongoing tobacco abuse recalcitrant risk factor modification 

## 2015-10-13 NOTE — Assessment & Plan Note (Signed)
History of peripheral arterial disease status post right popliteal diamondback orbital rotational atherectomy/ 07/29/13 for claudication.her last Doppler study performed 03/18/15 showed a widely patent atherectomy site. She currently denies claudication.

## 2015-10-13 NOTE — Assessment & Plan Note (Signed)
History of hyperlipidemia on statin therapy followed by her PCP. 

## 2015-10-13 NOTE — Patient Instructions (Signed)

## 2015-10-13 NOTE — Progress Notes (Signed)
10/13/2015 Ariel Cobb   06/17/1946  [id number]  Primary Physician Ariel Hams, MD Primary Cardiologist: Ariel Harp MD Ariel Cobb   HPI:  [name] is a 70 year old thin appearing widowed Caucasian female no children and who lives alone. I last saw her in the office 03/10/15. She is accompanied by one of her sisters today.She was referred by Dr. Ila Cobb at [facility] for evaluation and treatment of claudication. Her primary care physician is Dr. Alta Cobb at [facility]. Her cardiovascular risk factors include a 50-pack-year history tobacco abuse currently smoking one pack per day, 2 hypertension, diabetes and hyperlipidemia. Her father died suddenly of microinfarction at age 72. She has never had a heart attack or stroke patient is compared to sleep denies chest pain. She complains of right calf claudication with recent arterial Dopplers performed in our office on 04/25/13 revealing a right ABI 0.93 with a high-frequency signal in the right popliteal artery. She underwent angiography on 12/9 revealing a high-grade right popliteal artery stenosis with three-vessel runoff. Because of moderate renal deficiency her intervention was staged until today.I performed angiography and intervention on her 2 /2/15. She had a 99% calcified lesion in the right popliteal artery but I performed diamondback orbital rotational atherectomy on. Her symptoms of claudication resolved after her procedure and her Dopplers normalized. Her most recent Dopplers performed 02/05/14 revealed ABIs of 1 bilaterally with a widely patent right popliteal artery. She apparently was seen in the emergency room this past weekend with atypical chest pain and ruled out for myocardial infarction. Her EKG showed no acute changes.     Current Outpatient Prescriptions  Medication Sig Dispense Refill  . albuterol (PROVENTIL HFA;VENTOLIN HFA) 108 (90 BASE) MCG/ACT inhaler Inhale into the lungs every 6  (six) hours as needed for wheezing or shortness of breath.    [name] aspirin EC 81 MG tablet Take 81 mg by mouth daily.    [name] atorvastatin (LIPITOR) 20 MG tablet Take 20 mg by mouth every morning.     . budesonide-formoterol (SYMBICORT) 160-4.5 MCG/ACT inhaler Inhale 2 puffs into the lungs 2 (two) times daily.    . Calcium Carb-Cholecalciferol (CALCIUM 600/VITAMIN D3) 600-800 MG-UNIT TABS Take 1 tablet by mouth daily.     . clopidogrel (PLAVIX) 75 MG tablet TAKE 1 TABLET (75 MG TOTAL) BY MOUTH DAILY WITH BREAKFAST. 90 tablet 3  . hydrochlorothiazide (HYDRODIURIL) 25 MG tablet Take 25 mg by mouth daily.  2  . Insulin Glargine (LANTUS SOLOSTAR) 100 UNIT/ML Solostar Pen Inject 10 Units into the skin daily at 12 noon. 11 units for blood sugar < 400 and 11+ units for blood sugar >= 400    . levothyroxine (SYNTHROID, LEVOTHROID) 100 MCG tablet Take 1 tablet by mouth daily.    [name] NOVOLOG FLEXPEN 100 UNIT/ML FlexPen Inject 4 Units into the skin 3 (three) times daily.   11  . ramipril (ALTACE) 10 MG capsule Take 10 mg by mouth 2 (two) times daily.     . sodium chloride (OCEAN) 0.65 % SOLN nasal spray Place 1 spray into both nostrils as needed for congestion.     No current facility-administered medications for this visit.    Allergies  Allergen Reactions  . Adhesive [Tape] Itching  . Amoxicillin Hives, Itching and Swelling  . Latex Itching    Social History   Social History  . Marital Status: Widowed    Spouse Name: N/A  . Number of Children: N/A  . Years of  Education: N/A   Occupational History  . Not on file.   Social History Main Topics  . Smoking status: Current Every Day Smoker -- 1.00 packs/day for 50 years    Types: Cigarettes  . Smokeless tobacco: Never Used  . Alcohol Use: No  . Drug Use: No  . Sexual Activity: No   Other Topics Concern  . Not on file   Social History Narrative     Review of Systems: General: negative for chills, fever, night sweats or weight changes.    Cardiovascular: negative for chest pain, dyspnea on exertion, edema, orthopnea, palpitations, paroxysmal nocturnal dyspnea or shortness of breath Dermatological: negative for rash Respiratory: negative for cough or wheezing Urologic: negative for hematuria Abdominal: negative for nausea, vomiting, diarrhea, bright red blood per rectum, melena, or hematemesis Neurologic: negative for visual changes, syncope, or dizziness All other systems reviewed and are otherwise negative except as noted above.    Blood pressure 150/52, pulse 68, height 5\' 5"  (1.651 m), weight 112 lb 3.2 oz (50.894 kg), SpO2 97 %.  General appearance: alert and no distress Neck: no adenopathy, no carotid bruit, no JVD, supple, symmetrical, trachea midline and thyroid not enlarged, symmetric, no tenderness/mass/nodules Lungs: clear to auscultation bilaterally Heart: regular rate and rhythm, S1, S2 normal, no murmur, click, rub or gallop Extremities: extremities normal, atraumatic, no cyanosis or edema  EKG not performed today  ASSESSMENT AND PLAN:   PAD (peripheral artery disease) History of peripheral arterial disease status post right popliteal diamondback orbital rotational atherectomy/ 07/29/13 for claudication.her last Doppler study performed 03/18/15 showed a widely patent atherectomy site. She currently denies claudication.  Essential hypertension History of hypertension blood pressure measured at 150/52. She is on hydrochlorothiazide and ramipril. Continue current meds at current dosing  Hyperlipidemia History of hyperlipidemia on statin therapy followed by her PCP  Tobacco abuse History of ongoing tobacco abuse recalcitrant risk factor modification.      Ariel Harp MD FACP,FACC,FAHA, [facility] 10/13/2015 9:09 AM

## 2015-11-18 DIAGNOSIS — E139 Other specified diabetes mellitus without complications: Secondary | ICD-10-CM | POA: Diagnosis not present

## 2015-11-18 DIAGNOSIS — Z794 Long term (current) use of insulin: Secondary | ICD-10-CM | POA: Diagnosis not present

## 2015-11-18 DIAGNOSIS — E039 Hypothyroidism, unspecified: Secondary | ICD-10-CM | POA: Diagnosis not present

## 2015-11-18 DIAGNOSIS — Z720 Tobacco use: Secondary | ICD-10-CM | POA: Diagnosis not present

## 2016-01-05 DIAGNOSIS — M810 Age-related osteoporosis without current pathological fracture: Secondary | ICD-10-CM | POA: Diagnosis not present

## 2016-01-05 DIAGNOSIS — F17210 Nicotine dependence, cigarettes, uncomplicated: Secondary | ICD-10-CM | POA: Diagnosis not present

## 2016-01-05 DIAGNOSIS — Z794 Long term (current) use of insulin: Secondary | ICD-10-CM | POA: Diagnosis not present

## 2016-01-05 DIAGNOSIS — N183 Chronic kidney disease, stage 3 (moderate): Secondary | ICD-10-CM | POA: Diagnosis not present

## 2016-01-05 DIAGNOSIS — E1121 Type 2 diabetes mellitus with diabetic nephropathy: Secondary | ICD-10-CM | POA: Diagnosis not present

## 2016-01-05 DIAGNOSIS — E039 Hypothyroidism, unspecified: Secondary | ICD-10-CM | POA: Diagnosis not present

## 2016-01-05 DIAGNOSIS — J449 Chronic obstructive pulmonary disease, unspecified: Secondary | ICD-10-CM | POA: Diagnosis not present

## 2016-01-05 DIAGNOSIS — E7800 Pure hypercholesterolemia, unspecified: Secondary | ICD-10-CM | POA: Diagnosis not present

## 2016-01-05 DIAGNOSIS — I10 Essential (primary) hypertension: Secondary | ICD-10-CM | POA: Diagnosis not present

## 2016-01-05 DIAGNOSIS — E1151 Type 2 diabetes mellitus with diabetic peripheral angiopathy without gangrene: Secondary | ICD-10-CM | POA: Diagnosis not present

## 2016-03-08 ENCOUNTER — Other Ambulatory Visit: Payer: Self-pay | Admitting: *Deleted

## 2016-03-08 MED ORDER — CLOPIDOGREL BISULFATE 75 MG PO TABS: ORAL_TABLET | ORAL | 2 refills | Status: DC

## 2016-03-16 DIAGNOSIS — Z23 Encounter for immunization: Secondary | ICD-10-CM | POA: Diagnosis not present

## 2016-04-04 ENCOUNTER — Other Ambulatory Visit: Payer: Self-pay | Admitting: Internal Medicine

## 2016-04-04 DIAGNOSIS — Z1231 Encounter for screening mammogram for malignant neoplasm of breast: Secondary | ICD-10-CM

## 2016-04-13 ENCOUNTER — Ambulatory Visit
Admission: RE | Admit: 2016-04-13 | Discharge: 2016-04-13 | Disposition: A | Discharge status: Home / Self Care | Payer: Medicare Other | Source: Ambulatory Visit | Attending: Internal Medicine | Admitting: Internal Medicine

## 2016-04-13 DIAGNOSIS — Z1231 Encounter for screening mammogram for malignant neoplasm of breast: Secondary | ICD-10-CM | POA: Diagnosis not present

## 2016-04-15 ENCOUNTER — Other Ambulatory Visit: Payer: Self-pay | Admitting: Internal Medicine

## 2016-04-15 DIAGNOSIS — R928 Other abnormal and inconclusive findings on diagnostic imaging of breast: Secondary | ICD-10-CM

## 2016-04-22 ENCOUNTER — Ambulatory Visit
Admission: RE | Admit: 2016-04-22 | Discharge: 2016-04-22 | Disposition: A | Discharge status: Home / Self Care | Payer: Medicare Other | Source: Ambulatory Visit | Attending: Internal Medicine | Admitting: Internal Medicine

## 2016-04-22 DIAGNOSIS — R928 Other abnormal and inconclusive findings on diagnostic imaging of breast: Secondary | ICD-10-CM

## 2016-04-22 DIAGNOSIS — R921 Mammographic calcification found on diagnostic imaging of breast: Secondary | ICD-10-CM | POA: Diagnosis not present

## 2016-05-25 DIAGNOSIS — E039 Hypothyroidism, unspecified: Secondary | ICD-10-CM | POA: Diagnosis not present

## 2016-05-25 DIAGNOSIS — Z720 Tobacco use: Secondary | ICD-10-CM | POA: Diagnosis not present

## 2016-05-25 DIAGNOSIS — Z794 Long term (current) use of insulin: Secondary | ICD-10-CM | POA: Diagnosis not present

## 2016-05-25 DIAGNOSIS — E139 Other specified diabetes mellitus without complications: Secondary | ICD-10-CM | POA: Diagnosis not present

## 2016-06-06 DIAGNOSIS — H35372 Puckering of macula, left eye: Secondary | ICD-10-CM | POA: Diagnosis not present

## 2016-06-06 DIAGNOSIS — H40012 Open angle with borderline findings, low risk, left eye: Secondary | ICD-10-CM | POA: Diagnosis not present

## 2016-06-06 DIAGNOSIS — H40011 Open angle with borderline findings, low risk, right eye: Secondary | ICD-10-CM | POA: Diagnosis not present

## 2016-06-06 DIAGNOSIS — E113293 Type 2 diabetes mellitus with mild nonproliferative diabetic retinopathy without macular edema, bilateral: Secondary | ICD-10-CM | POA: Diagnosis not present

## 2016-07-18 DIAGNOSIS — H8113 Benign paroxysmal vertigo, bilateral: Secondary | ICD-10-CM | POA: Diagnosis not present

## 2016-07-18 DIAGNOSIS — H6123 Impacted cerumen, bilateral: Secondary | ICD-10-CM | POA: Diagnosis not present

## 2016-07-22 DIAGNOSIS — E7800 Pure hypercholesterolemia, unspecified: Secondary | ICD-10-CM | POA: Diagnosis not present

## 2016-07-22 DIAGNOSIS — D649 Anemia, unspecified: Secondary | ICD-10-CM | POA: Diagnosis not present

## 2016-07-22 DIAGNOSIS — Z794 Long term (current) use of insulin: Secondary | ICD-10-CM | POA: Diagnosis not present

## 2016-07-22 DIAGNOSIS — Z1389 Encounter for screening for other disorder: Secondary | ICD-10-CM | POA: Diagnosis not present

## 2016-07-22 DIAGNOSIS — I739 Peripheral vascular disease, unspecified: Secondary | ICD-10-CM | POA: Diagnosis not present

## 2016-07-22 DIAGNOSIS — E1122 Type 2 diabetes mellitus with diabetic chronic kidney disease: Secondary | ICD-10-CM | POA: Diagnosis not present

## 2016-07-22 DIAGNOSIS — M810 Age-related osteoporosis without current pathological fracture: Secondary | ICD-10-CM | POA: Diagnosis not present

## 2016-07-22 DIAGNOSIS — I700 Atherosclerosis of aorta: Secondary | ICD-10-CM | POA: Diagnosis not present

## 2016-07-22 DIAGNOSIS — I10 Essential (primary) hypertension: Secondary | ICD-10-CM | POA: Diagnosis not present

## 2016-07-22 DIAGNOSIS — Z0000 Encounter for general adult medical examination without abnormal findings: Secondary | ICD-10-CM | POA: Diagnosis not present

## 2016-07-22 DIAGNOSIS — N183 Chronic kidney disease, stage 3 (moderate): Secondary | ICD-10-CM | POA: Diagnosis not present

## 2016-07-28 DIAGNOSIS — Z1211 Encounter for screening for malignant neoplasm of colon: Secondary | ICD-10-CM | POA: Diagnosis not present

## 2016-07-28 DIAGNOSIS — D649 Anemia, unspecified: Secondary | ICD-10-CM | POA: Diagnosis not present

## 2016-08-26 ENCOUNTER — Other Ambulatory Visit: Payer: Self-pay | Admitting: Gastroenterology

## 2016-09-02 ENCOUNTER — Encounter (HOSPITAL_COMMUNITY): Payer: Self-pay | Admitting: *Deleted

## 2016-09-06 ENCOUNTER — Ambulatory Visit (HOSPITAL_COMMUNITY): Payer: Medicare Other | Admitting: Anesthesiology

## 2016-09-06 ENCOUNTER — Ambulatory Visit (HOSPITAL_COMMUNITY)
Admission: RE | Admit: 2016-09-06 | Discharge: 2016-09-06 | Disposition: A | Discharge status: Home / Self Care | Payer: Medicare Other | Source: Ambulatory Visit | Attending: Gastroenterology | Admitting: Gastroenterology

## 2016-09-06 ENCOUNTER — Encounter (HOSPITAL_COMMUNITY)
Admission: RE | Disposition: A | Discharge status: Home / Self Care | Payer: Self-pay | Source: Ambulatory Visit | Attending: Gastroenterology

## 2016-09-06 ENCOUNTER — Encounter (HOSPITAL_COMMUNITY): Payer: Self-pay

## 2016-09-06 DIAGNOSIS — I10 Essential (primary) hypertension: Secondary | ICD-10-CM | POA: Diagnosis not present

## 2016-09-06 DIAGNOSIS — E039 Hypothyroidism, unspecified: Secondary | ICD-10-CM | POA: Insufficient documentation

## 2016-09-06 DIAGNOSIS — E119 Type 2 diabetes mellitus without complications: Secondary | ICD-10-CM | POA: Insufficient documentation

## 2016-09-06 DIAGNOSIS — F17200 Nicotine dependence, unspecified, uncomplicated: Secondary | ICD-10-CM | POA: Insufficient documentation

## 2016-09-06 DIAGNOSIS — M810 Age-related osteoporosis without current pathological fracture: Secondary | ICD-10-CM | POA: Insufficient documentation

## 2016-09-06 DIAGNOSIS — E7800 Pure hypercholesterolemia, unspecified: Secondary | ICD-10-CM | POA: Diagnosis not present

## 2016-09-06 DIAGNOSIS — N189 Chronic kidney disease, unspecified: Secondary | ICD-10-CM | POA: Diagnosis not present

## 2016-09-06 DIAGNOSIS — I129 Hypertensive chronic kidney disease with stage 1 through stage 4 chronic kidney disease, or unspecified chronic kidney disease: Secondary | ICD-10-CM | POA: Diagnosis not present

## 2016-09-06 DIAGNOSIS — K2950 Unspecified chronic gastritis without bleeding: Secondary | ICD-10-CM | POA: Insufficient documentation

## 2016-09-06 DIAGNOSIS — Z880 Allergy status to penicillin: Secondary | ICD-10-CM | POA: Diagnosis not present

## 2016-09-06 DIAGNOSIS — J449 Chronic obstructive pulmonary disease, unspecified: Secondary | ICD-10-CM | POA: Diagnosis not present

## 2016-09-06 DIAGNOSIS — E785 Hyperlipidemia, unspecified: Secondary | ICD-10-CM | POA: Diagnosis not present

## 2016-09-06 DIAGNOSIS — D509 Iron deficiency anemia, unspecified: Secondary | ICD-10-CM | POA: Insufficient documentation

## 2016-09-06 HISTORY — DX: Adverse effect of unspecified anesthetic, initial encounter: T41.45XA

## 2016-09-06 HISTORY — PX: ESOPHAGOGASTRODUODENOSCOPY (EGD) WITH PROPOFOL: SHX5813

## 2016-09-06 HISTORY — DX: Anemia, unspecified: D64.9

## 2016-09-06 HISTORY — PX: COLONOSCOPY WITH PROPOFOL: SHX5780

## 2016-09-06 LAB — GLUCOSE, CAPILLARY: Glucose-Capillary: 89 mg/dL (ref 65–99)

## 2016-09-06 SURGERY — COLONOSCOPY WITH PROPOFOL
Anesthesia: Monitor Anesthesia Care

## 2016-09-06 MED ORDER — LIDOCAINE 2% (20 MG/ML) 5 ML SYRINGE
INTRAMUSCULAR | Status: DC | PRN
  Administered 2016-09-06: 100 mg via INTRAVENOUS

## 2016-09-06 MED ORDER — SODIUM CHLORIDE 0.9 % IV SOLN: INTRAVENOUS | Status: DC

## 2016-09-06 MED ORDER — PROPOFOL 10 MG/ML IV BOLUS
INTRAVENOUS | Status: AC
  Filled 2016-09-06: qty 40

## 2016-09-06 MED ORDER — LIDOCAINE 2% (20 MG/ML) 5 ML SYRINGE
INTRAMUSCULAR | Status: AC
  Filled 2016-09-06: qty 5

## 2016-09-06 MED ORDER — PROPOFOL 10 MG/ML IV BOLUS
INTRAVENOUS | Status: DC | PRN
  Administered 2016-09-06: 40 mg via INTRAVENOUS
  Administered 2016-09-06 (×3): 20 mg via INTRAVENOUS

## 2016-09-06 MED ORDER — PROPOFOL 500 MG/50ML IV EMUL
INTRAVENOUS | Status: DC | PRN
  Administered 2016-09-06: 100 ug/kg/min via INTRAVENOUS

## 2016-09-06 MED ORDER — LACTATED RINGERS IV SOLN
INTRAVENOUS | Status: DC
  Administered 2016-09-06: 1000 mL via INTRAVENOUS

## 2016-09-06 SURGICAL SUPPLY — 24 items

## 2016-09-06 NOTE — Anesthesia Postprocedure Evaluation (Signed)
Anesthesia Post Note  Patient: Ariel Cobb  Procedure(s) Performed: Procedure(s) (LRB): COLONOSCOPY WITH PROPOFOL (N/A) ESOPHAGOGASTRODUODENOSCOPY (EGD) WITH PROPOFOL (N/A)  Patient location during evaluation: PACU Anesthesia Type: MAC Level of consciousness: awake and alert Pain management: pain level controlled Vital Signs Assessment: post-procedure vital signs reviewed and stable Respiratory status: spontaneous breathing, nonlabored ventilation, respiratory function stable and patient connected to nasal cannula oxygen Cardiovascular status: stable and blood pressure returned to baseline Anesthetic complications: no       Last Vitals:  Vitals:   09/06/16 1200 09/06/16 1210  BP: (!) 195/53 (!) 190/62  Pulse: 62 (!) 57  Resp: 15 15  Temp:      Last Pain:  Vitals:   09/06/16 1139  TempSrc: Oral                 [name] EDWARD

## 2016-09-06 NOTE — Op Note (Signed)
[facility] Patient Name: Ariel Cobb Procedure Date: 09/06/2016 MRN: [id number] Attending MD: Garlan Fair , MD Date of Birth: 1946/04/30 CSN: [id number] Age: 71 Admit Type: Outpatient Procedure:                Colonoscopy Indications:              Unexplained iron deficiency anemia Providers:                Garlan Fair, MD, Hilma Favors, RN, Elspeth Cho Tech., Technician, [facility],                            CRNA Referring MD:              Medicines:                Propofol per Anesthesia Complications:            No immediate complications. Estimated Blood Loss:     Estimated blood loss: none. Procedure:                Pre-Anesthesia Assessment:                           - Prior to the procedure, a History and Physical                            was performed, and patient medications and                            allergies were reviewed. The patient's tolerance of                            previous anesthesia was also reviewed. The risks                            and benefits of the procedure and the sedation                            options and risks were discussed with the patient.                            All questions were answered, and informed consent                            was obtained. Prior Anticoagulants: The patient has                            taken aspirin, last dose was day of procedure. ASA                            Grade Assessment: III - A patient with severe  systemic disease. After reviewing the risks and                            benefits, the patient was deemed in satisfactory                            condition to undergo the procedure.                           After obtaining informed consent, the colonoscope                            was passed under direct vision. Throughout the                            procedure, the patient's blood  pressure, pulse, and                            oxygen saturations were monitored continuously. The                            EC-[id number] ([id number]) scope was introduced through                            the anus and advanced to the the cecum, identified                            by appendiceal orifice and ileocecal valve. The                            colonoscopy was performed without difficulty. The                            patient tolerated the procedure well. The quality                            of the bowel preparation was good. The ileocecal                            valve, the appendiceal orifice and the rectum were                            photographed. Scope In: 11:07:37 AM Scope Out: 11:26:25 AM Scope Withdrawal Time: 0 hours 11 minutes 28 seconds  Total Procedure Duration: 0 hours 18 minutes 48 seconds  Findings:      The perianal and digital rectal examinations were normal.      The entire examined colon appeared normal. Impression:               - The entire examined colon is normal.                           - No specimens collected. Moderate Sedation:      N/A- Per Anesthesia Care Recommendation:           -  Patient has a contact number available for                            emergencies. The signs and symptoms of potential                            delayed complications were discussed with the                            patient. Return to normal activities tomorrow.                            Written discharge instructions were provided to the                            patient.                           - Repeat colonoscopy is not recommended for                            screening purposes.                           - Resume previous diet.                           - Continue present medications. Procedure Code(s):        --- Professional ---                           [phone], Colonoscopy, flexible; diagnostic, including                             collection of specimen(s) by brushing or washing,                            when performed (separate procedure) Diagnosis Code(s):        --- Professional ---                           D50.9, Iron deficiency anemia, unspecified CPT copyright 2016 American Medical Association. All rights reserved. The codes documented in this report are preliminary and upon coder review may  be revised to meet current compliance requirements. [name], MD Garlan Fair, MD 09/06/2016 11:35:48 AM This report has been signed electronically. Number of Addenda: 0

## 2016-09-06 NOTE — H&P (Signed)
Problem: Iron deficiency anemia with heme-negative stool which developed on aspirin and Plavix therapy. Tissue transglutaminase antibody was normal. 10/13/2009 normal screening colonoscopy was performed.  History: The patient is a 71 year old female born Mar 13, 1946. She has developed chronic iron deficiency anemia taking aspirin and Plavix on a daily basis. She reports no gastrointestinal bleeding. Screen for celiac disease was normal.  07/22/2016 serum ferritin was 10.1 ng/mL and serum iron saturation was 6%. 2/lungs/2018 hemoglobin was 9.1 g. 10/25/2013 hemoglobin was 10.2 g.  The patient is scheduled to undergo diagnostic esophagogastroduodenoscopy and colonoscopy.  Past medical history: Chronic obstructive pulmonary disease. Type 2 diabetes mellitus. Hypertension. Hypercholesterolemia. Hypothyroidism. Osteoporosis. Cataracts. Cholecystectomy. Left hip replacement surgery. Cataract surgery.  Medication allergies: Amoxicillin, latex Actos  Family history: Negative for colon cancer  Habits: The patient currently smokes one pack of cigarettes per day  Exam: The patient is alert and lying comfortably on the endoscopy stretcher. Abdomen is soft and nontender to palpation. Lungs are clear to auscultation. Cardiac exam reveals a regular rhythm.  Plan: Proceed with diagnostic esophagogastroduodenoscopy followed by colonoscopy.

## 2016-09-06 NOTE — Op Note (Signed)
[facility] Patient Name: Ariel Cobb Procedure Date: 09/06/2016 MRN: [id number] Attending MD: Garlan Fair , MD Date of Birth: 04/14/1946 CSN: [id number] Age: 71 Admit Type: Outpatient Procedure:                Upper GI endoscopy Indications:              Unexplained iron deficiency anemia Providers:                Garlan Fair, MD, Hilma Favors, RN, Elspeth Cho Tech., Technician, [facility],                            CRNA Referring MD:              Medicines:                Propofol per Anesthesia Complications:            No immediate complications. Estimated Blood Loss:     Estimated blood loss was minimal. Procedure:                Pre-Anesthesia Assessment:                           - Prior to the procedure, a History and Physical                            was performed, and patient medications and                            allergies were reviewed. The patient's tolerance of                            previous anesthesia was also reviewed. The risks                            and benefits of the procedure and the sedation                            options and risks were discussed with the patient.                            All questions were answered, and informed consent                            was obtained. Prior Anticoagulants: The patient has                            taken aspirin, last dose was day of procedure. ASA                            Grade Assessment: III - A patient with severe  systemic disease. After reviewing the risks and                            benefits, the patient was deemed in satisfactory                            condition to undergo the procedure.                           After obtaining informed consent, the endoscope was                            passed under direct vision. Throughout the                            procedure, the patient's  blood pressure, pulse, and                            oxygen saturations were monitored continuously. The                            [id number] ([id number]) scope was introduced through the                            mouth, and advanced to the third part of duodenum.                            The upper GI endoscopy was accomplished without                            difficulty. The patient tolerated the procedure                            well. Scope In: Scope Out: Findings:      The Z-line was regular and was found 37 cm from the incisors.      The examined esophagus was normal.      The entire examined stomach was normal except for gastric atrophy.       Biopsies were taken with a cold forceps for histology.      The examined duodenum was normal. Impression:               - Z-line regular, 37 cm from the incisors.                           - Normal esophagus.                           - Normal stomach. Biopsied.                           - Normal examined duodenum. Moderate Sedation:      N/A- Per Anesthesia Care Recommendation:           - Patient has a contact number available for  emergencies. The signs and symptoms of potential                            delayed complications were discussed with the                            patient. Return to normal activities tomorrow.                            Written discharge instructions were provided to the                            patient.                           - Await pathology results.                           - Resume previous diet.                           - Continue present medications. Procedure Code(s):        --- Professional ---                           [phone], Esophagogastroduodenoscopy, flexible,                            transoral; with biopsy, single or multiple Diagnosis Code(s):        --- Professional ---                           D50.9, Iron deficiency anemia, unspecified CPT  copyright 2016 American Medical Association. All rights reserved. The codes documented in this report are preliminary and upon coder review may  be revised to meet current compliance requirements. [name], MD Garlan Fair, MD 09/06/2016 11:33:28 AM This report has been signed electronically. Number of Addenda: 0

## 2016-09-06 NOTE — Transfer of Care (Signed)
Immediate Anesthesia Transfer of Care Note  Patient: Ariel Cobb  Procedure(s) Performed: Procedure(s): COLONOSCOPY WITH PROPOFOL (N/A) ESOPHAGOGASTRODUODENOSCOPY (EGD) WITH PROPOFOL (N/A)  Patient Location: PACU  Anesthesia Type:MAC  Level of Consciousness: awake, alert  and oriented  Airway & Oxygen Therapy: Patient Spontanous Breathing and Patient connected to nasal cannula oxygen  Post-op Assessment: Report given to RN and Post -op Vital signs reviewed and stable  Post vital signs: Reviewed and stable  Last Vitals:  Vitals:   09/06/16 1001  BP: (!) 188/60  Pulse: 62  Resp: 16  Temp: 36.2 C    Last Pain:  Vitals:   09/06/16 1001  TempSrc: Oral         Complications: No apparent anesthesia complications

## 2016-09-06 NOTE — Anesthesia Preprocedure Evaluation (Signed)
Anesthesia Evaluation  Patient identified by MRN, date of birth, ID band Patient awake    Reviewed: Allergy & Precautions, H&P , Patient's Chart, lab work & pertinent test results, reviewed documented beta blocker date and time   Airway Mallampati: II  TM Distance: >3 FB Neck ROM: full    Dental no notable dental hx.    Pulmonary Current Smoker,    Pulmonary exam normal breath sounds clear to auscultation       Cardiovascular hypertension,  Rhythm:regular Rate:Normal     Neuro/Psych    GI/Hepatic   Endo/Other  diabetes  Renal/GU      Musculoskeletal   Abdominal   Peds  Hematology   Anesthesia Other Findings Thyroid disease    Hypertension   Asthma      Claudication  Chronic bronchitis   Hypothyroidism    Arthritis   Shortness of breath  on exertion  Type II diabetes mellitus (HCC)    Complication of anesthesia 2000 slow to awaken after hip surgery       Reproductive/Obstetrics                             Anesthesia Physical Anesthesia Plan  ASA: II  Anesthesia Plan: MAC   Post-op Pain Management:    Induction: Intravenous  Airway Management Planned: Mask and Natural Airway  Additional Equipment:   Intra-op Plan:   Post-operative Plan:   Informed Consent: I have reviewed the patients History and Physical, chart, labs and discussed the procedure including the risks, benefits and alternatives for the proposed anesthesia with the patient or authorized representative who has indicated his/her understanding and acceptance.   Dental Advisory Given  Plan Discussed with: CRNA and Surgeon  Anesthesia Plan Comments: (Discussed sedation and potential to need to place airway or ETT if warranted by clinical changes intra-operatively. We will start procedure as MAC.)        Anesthesia Quick Evaluation

## 2016-09-06 NOTE — Discharge Instructions (Signed)

## 2016-09-07 ENCOUNTER — Encounter (HOSPITAL_COMMUNITY): Payer: Self-pay | Admitting: Gastroenterology

## 2016-10-03 DIAGNOSIS — N183 Chronic kidney disease, stage 3 (moderate): Secondary | ICD-10-CM | POA: Diagnosis not present

## 2016-10-20 DIAGNOSIS — D509 Iron deficiency anemia, unspecified: Secondary | ICD-10-CM | POA: Diagnosis not present

## 2016-10-21 DIAGNOSIS — I10 Essential (primary) hypertension: Secondary | ICD-10-CM | POA: Diagnosis not present

## 2016-10-21 DIAGNOSIS — E119 Type 2 diabetes mellitus without complications: Secondary | ICD-10-CM | POA: Diagnosis not present

## 2016-10-21 DIAGNOSIS — N183 Chronic kidney disease, stage 3 (moderate): Secondary | ICD-10-CM | POA: Diagnosis not present

## 2016-11-10 ENCOUNTER — Telehealth: Payer: Self-pay | Admitting: Cardiovascular Disease

## 2016-11-10 NOTE — Telephone Encounter (Signed)
Pt of Dr. Gwenlyn Found Hx of PAD Last seen in office 4/17 On Plavix, ASA  Pleasant patient, we had a discussion regarding her being followed by PCP for low iron levels.  There was some concern for this being caused by GI bleed (though she had no obvious symptoms) She had upper/lower endoscopy on March 26th. The week prior she held plavix for procedure.  W/u was apparently negative for any abnormalities GI specialist instructed her to remain off plavix bc her iron levels had improved. She has currently not resumed this med and is not sure of rationale for going back on it. Cites ease of bruising and leg pain as reasons for not going back on it.  I have scheduled patient for return OV (overdue 1 year). Will route to Dr. Gwenlyn Found for any advice re: plavix.

## 2016-11-10 NOTE — Telephone Encounter (Signed)
Please call,needs to talk to you about her medications.

## 2016-11-13 NOTE — Telephone Encounter (Signed)
Will discuss when I see her back for ROV

## 2016-11-14 NOTE — Telephone Encounter (Signed)
APPT SCHEDULED 11-25-16@820AM 

## 2016-11-23 DIAGNOSIS — E139 Other specified diabetes mellitus without complications: Secondary | ICD-10-CM | POA: Diagnosis not present

## 2016-11-23 DIAGNOSIS — E039 Hypothyroidism, unspecified: Secondary | ICD-10-CM | POA: Diagnosis not present

## 2016-11-23 DIAGNOSIS — Z794 Long term (current) use of insulin: Secondary | ICD-10-CM | POA: Diagnosis not present

## 2016-11-23 DIAGNOSIS — Z720 Tobacco use: Secondary | ICD-10-CM | POA: Diagnosis not present

## 2016-11-25 ENCOUNTER — Encounter: Payer: Self-pay | Admitting: Cardiovascular Disease

## 2016-11-25 ENCOUNTER — Ambulatory Visit (INDEPENDENT_AMBULATORY_CARE_PROVIDER_SITE_OTHER): Payer: Medicare Other | Admitting: Cardiovascular Disease

## 2016-11-25 VITALS — BP 136/50 | HR 60 | Ht 66.0 in | Wt 113.4 lb

## 2016-11-25 DIAGNOSIS — I10 Essential (primary) hypertension: Secondary | ICD-10-CM | POA: Diagnosis not present

## 2016-11-25 DIAGNOSIS — E7800 Pure hypercholesterolemia, unspecified: Secondary | ICD-10-CM | POA: Diagnosis not present

## 2016-11-25 DIAGNOSIS — I739 Peripheral vascular disease, unspecified: Secondary | ICD-10-CM | POA: Diagnosis not present

## 2016-11-25 DIAGNOSIS — Z720 Tobacco use: Secondary | ICD-10-CM | POA: Diagnosis not present

## 2016-11-25 NOTE — Assessment & Plan Note (Signed)
History of peripheral arterial disease status post diamondback orbital rotational atherectomy,  PTA of a high-grade calcified right popliteal artery stenosis by myself on 07/29/13. Her claudication resolved. Her last Dopplers performed 03/18/15 were essentially normal. She was on Plavix but apparently has had some iron deficiency and I told her that she could discontinue this. We will will recheck lower extremity until Doppler studies.

## 2016-11-25 NOTE — Assessment & Plan Note (Signed)
History of hyperlipidemia on statin therapy followed by her PCP. 

## 2016-11-25 NOTE — Assessment & Plan Note (Signed)
History of continued tobacco abuse recalcitrant to risk factor modification 

## 2016-11-25 NOTE — Patient Instructions (Signed)
Medication Instructions: Your physician recommends that you continue on your current medications as directed. Please refer to the Current Medication list given to you today.   Labwork: I will request lab work from Dr. Lina Sar office.  Testing/Procedures: Your physician has requested that you have a lower extremity arterial duplex. During this test, ultrasound is used to evaluate arterial blood flow in the legs. Allow one hour for this exam. There are no restrictions or special instructions.  Your physician has requested that you have an ankle brachial index (ABI). During this test an ultrasound and blood pressure cuff are used to evaluate the arteries that supply the arms and legs with blood. Allow thirty minutes for this exam. There are no restrictions or special instructions.  Follow-Up: Your physician wants you to follow-up in: 2 year with Dr. Gwenlyn Found. You will receive a reminder letter in the mail two months in advance. If you don't receive a letter, please call our office to schedule the follow-up appointment.  If you need a refill on your cardiac medications before your next appointment, please call your pharmacy.

## 2016-11-25 NOTE — Progress Notes (Signed)
11/25/2016 Ariel Cobb   October 25, 1945  [id number]  Primary Physician Ariel Carol, MD Primary Cardiologist: Ariel Harp MD Ariel Cobb  HPI:  [name] is a 71 year old thin appearing widowed Caucasian female no children and who lives alone. I last saw her in the office 10/13/15. She is accompanied by one of her sisters today.She was referred by Dr. Ila Cobb at [facility] for evaluation and treatment of claudication. Her primary care physician is Dr. Alta Cobb at [facility]. Her cardiovascular risk factors include a 50-pack-year history tobacco abuse currently smoking one pack per day, 2 hypertension, diabetes and hyperlipidemia. Her father died suddenly of microinfarction at age 51. She has never had a heart attack or stroke patient is compared to sleep denies chest pain. She complains of right calf claudication with recent arterial Dopplers performed in our office on 04/25/13 revealing a right ABI 0.93 with a high-frequency signal in the right popliteal artery. She underwent angiography on 12/9 revealing a high-grade right popliteal artery stenosis with three-vessel runoff. Because of moderate renal deficiency her intervention was staged until today.I performed angiography and intervention on her 2 /2/15. She had a 99% calcified lesion in the right popliteal artery but I performed diamondback orbital rotational atherectomy on. Her symptoms of claudication resolved after her procedure and her Dopplers normalized. Her most recent Dopplers performed 02/05/14 revealed ABIs of 1 bilaterally with a widely patent right popliteal artery. She apparently was seen in the emergency room this past weekend with atypical chest pain and ruled out for myocardial infarction. Her EKG showed no acute changes. Since I saw her a year ago she has undergone colonoscopy and endoscopy to evaluate iron deficiency anemia and was asked to stop Plavix which I have no problem with. She denies chest  pain, shortness of breath or claudication.    Current Outpatient Prescriptions  Medication Sig Dispense Refill  . albuterol (PROVENTIL HFA;VENTOLIN HFA) 108 (90 BASE) MCG/ACT inhaler Inhale into the lungs every 6 (six) hours as needed for wheezing or shortness of breath.    [name] aspirin EC 81 MG tablet Take 81 mg by mouth daily.    [name] atorvastatin (LIPITOR) 20 MG tablet Take 20 mg by mouth every morning.     . budesonide-formoterol (SYMBICORT) 160-4.5 MCG/ACT inhaler Inhale 2 puffs into the lungs 2 (two) times daily.    . Calcium Carb-Cholecalciferol (CALCIUM 600/VITAMIN D3) 600-800 MG-UNIT TABS Take 1 tablet by mouth daily.     . ferrous sulfate 325 (65 FE) MG tablet Take 325 mg by mouth every other day.    . hydrochlorothiazide (HYDRODIURIL) 25 MG tablet Take 25 mg by mouth daily.  2  . Insulin Glargine (LANTUS SOLOSTAR) 100 UNIT/ML Solostar Pen Inject 9 Units into the skin daily at 2 PM. 11 units for blood sugar < 400 and 11+ units for blood sugar >= 400    . levothyroxine (SYNTHROID, LEVOTHROID) 100 MCG tablet Take 100 mcg by mouth daily before breakfast.     . NOVOLOG FLEXPEN 100 UNIT/ML FlexPen Inject 3-4 Units into the skin 3 (three) times daily.   11  . ramipril (ALTACE) 10 MG capsule Take 20 mg by mouth daily at 2 PM.     . sodium chloride (OCEAN) 0.65 % SOLN nasal spray Place 1 spray into both nostrils as needed for congestion.     No current facility-administered medications for this visit.     Allergies  Allergen Reactions  . Adhesive [Tape] Itching  .  Amoxicillin Hives, Itching and Swelling  . Latex Itching    Social History   Social History  . Marital status: Widowed    Spouse name: N/A  . Number of children: N/A  . Years of education: N/A   Occupational History  . Not on file.   Social History Main Topics  . Smoking status: Current Every Day Smoker    Packs/day: 1.00    Years: 50.00    Types: Cigarettes  . Smokeless tobacco: Never Used  . Alcohol use No  .  Drug use: No  . Sexual activity: No   Other Topics Concern  . Not on file   Social History Narrative  . No narrative on file     Review of Systems: General: negative for chills, fever, night sweats or weight changes.  Cardiovascular: negative for chest pain, dyspnea on exertion, edema, orthopnea, palpitations, paroxysmal nocturnal dyspnea or shortness of breath Dermatological: negative for rash Respiratory: negative for cough or wheezing Urologic: negative for hematuria Abdominal: negative for nausea, vomiting, diarrhea, bright red blood per rectum, melena, or hematemesis Neurologic: negative for visual changes, syncope, or dizziness All other systems reviewed and are otherwise negative except as noted above.    Blood pressure (!) 136/50, pulse 60, height 5\' 6"  (1.676 m), weight 113 lb 6.4 oz (51.4 kg).  General appearance: alert and no distress Neck: no adenopathy, no carotid bruit, no JVD, supple, symmetrical, trachea midline and thyroid not enlarged, symmetric, no tenderness/mass/nodules Lungs: clear to auscultation bilaterally Heart: regular rate and rhythm, S1, S2 normal, no murmur, click, rub or gallop Extremities: extremities normal, atraumatic, no cyanosis or edema  EKG sinus rhythm at 60 with incomplete right bundle-branch block and inferior Q waves. I personally reviewed this EKG  ASSESSMENT AND PLAN:   PAD (peripheral artery disease) History of peripheral arterial disease status post diamondback orbital rotational atherectomy,  PTA of a high-grade calcified right popliteal artery stenosis by myself on 07/29/13. Her claudication resolved. Her last Dopplers performed 03/18/15 were essentially normal. She was on Plavix but apparently has had some iron deficiency and I told her that she could discontinue this. We will will recheck lower extremity until Doppler studies.  Essential hypertension History of essential hypertension blood pressure is 136/50. She is on  hydrochlorothiazide and ramipril. Continue current meds at current dosing.  Hyperlipidemia History of hyperlipidemia on statin therapy followed by her PCP  Tobacco abuse History of continued tobacco abuse recalcitrant to risk factor modification.      Ariel Harp MD FACP,FACC,FAHA, [facility] 11/25/2016 8:20 AM

## 2016-11-25 NOTE — Assessment & Plan Note (Signed)
History of essential hypertension blood pressure is 136/50. She is on hydrochlorothiazide and ramipril. Continue current meds at current dosing.

## 2016-12-14 ENCOUNTER — Ambulatory Visit (HOSPITAL_COMMUNITY)
Admission: RE | Admit: 2016-12-14 | Discharge: 2016-12-14 | Disposition: A | Discharge status: Home / Self Care | Payer: Medicare Other | Source: Ambulatory Visit | Attending: Cardiovascular Disease | Admitting: Cardiovascular Disease

## 2016-12-14 DIAGNOSIS — I739 Peripheral vascular disease, unspecified: Secondary | ICD-10-CM

## 2016-12-14 DIAGNOSIS — I70203 Unspecified atherosclerosis of native arteries of extremities, bilateral legs: Secondary | ICD-10-CM | POA: Insufficient documentation

## 2017-01-04 ENCOUNTER — Other Ambulatory Visit: Payer: Self-pay | Admitting: Internal Medicine

## 2017-01-04 DIAGNOSIS — R921 Mammographic calcification found on diagnostic imaging of breast: Secondary | ICD-10-CM

## 2017-01-05 DIAGNOSIS — Z720 Tobacco use: Secondary | ICD-10-CM | POA: Diagnosis not present

## 2017-01-05 DIAGNOSIS — L97919 Non-pressure chronic ulcer of unspecified part of right lower leg with unspecified severity: Secondary | ICD-10-CM | POA: Diagnosis not present

## 2017-01-05 DIAGNOSIS — G4762 Sleep related leg cramps: Secondary | ICD-10-CM | POA: Diagnosis not present

## 2017-01-06 NOTE — Anesthesia Postprocedure Evaluation (Signed)
Anesthesia Post Note  Patient: Ariel Cobb  Procedure(s) Performed: Procedure(s) (LRB): COLONOSCOPY WITH PROPOFOL (N/A) ESOPHAGOGASTRODUODENOSCOPY (EGD) WITH PROPOFOL (N/A)     Anesthesia Post Evaluation  Last Vitals:  Vitals:   09/06/16 1200 09/06/16 1210  BP: (!) 195/53 (!) 190/62  Pulse: 62 (!) 57  Resp: 15 15  Temp:      Last Pain:  Vitals:   09/06/16 1139  TempSrc: Oral                 Edwen Mclester EDWARD

## 2017-01-06 NOTE — Addendum Note (Signed)
Addendum  created 01/06/17 1446 by Lyndle Herrlich, MD   Sign clinical note

## 2017-01-19 DIAGNOSIS — J449 Chronic obstructive pulmonary disease, unspecified: Secondary | ICD-10-CM | POA: Diagnosis not present

## 2017-01-19 DIAGNOSIS — L97919 Non-pressure chronic ulcer of unspecified part of right lower leg with unspecified severity: Secondary | ICD-10-CM | POA: Diagnosis not present

## 2017-01-19 DIAGNOSIS — E7800 Pure hypercholesterolemia, unspecified: Secondary | ICD-10-CM | POA: Diagnosis not present

## 2017-01-19 DIAGNOSIS — D509 Iron deficiency anemia, unspecified: Secondary | ICD-10-CM | POA: Diagnosis not present

## 2017-01-19 DIAGNOSIS — F17210 Nicotine dependence, cigarettes, uncomplicated: Secondary | ICD-10-CM | POA: Diagnosis not present

## 2017-01-19 DIAGNOSIS — I739 Peripheral vascular disease, unspecified: Secondary | ICD-10-CM | POA: Diagnosis not present

## 2017-01-19 DIAGNOSIS — N183 Chronic kidney disease, stage 3 (moderate): Secondary | ICD-10-CM | POA: Diagnosis not present

## 2017-01-19 DIAGNOSIS — E1151 Type 2 diabetes mellitus with diabetic peripheral angiopathy without gangrene: Secondary | ICD-10-CM | POA: Diagnosis not present

## 2017-03-06 DIAGNOSIS — J449 Chronic obstructive pulmonary disease, unspecified: Secondary | ICD-10-CM | POA: Diagnosis not present

## 2017-03-06 DIAGNOSIS — J45909 Unspecified asthma, uncomplicated: Secondary | ICD-10-CM | POA: Diagnosis not present

## 2017-03-06 DIAGNOSIS — E039 Hypothyroidism, unspecified: Secondary | ICD-10-CM | POA: Diagnosis not present

## 2017-03-06 DIAGNOSIS — E1121 Type 2 diabetes mellitus with diabetic nephropathy: Secondary | ICD-10-CM | POA: Diagnosis not present

## 2017-03-06 DIAGNOSIS — D638 Anemia in other chronic diseases classified elsewhere: Secondary | ICD-10-CM | POA: Diagnosis not present

## 2017-03-06 DIAGNOSIS — M810 Age-related osteoporosis without current pathological fracture: Secondary | ICD-10-CM | POA: Diagnosis not present

## 2017-03-06 DIAGNOSIS — E782 Mixed hyperlipidemia: Secondary | ICD-10-CM | POA: Diagnosis not present

## 2017-03-06 DIAGNOSIS — I10 Essential (primary) hypertension: Secondary | ICD-10-CM | POA: Diagnosis not present

## 2017-03-06 DIAGNOSIS — E1122 Type 2 diabetes mellitus with diabetic chronic kidney disease: Secondary | ICD-10-CM | POA: Diagnosis not present

## 2017-03-06 DIAGNOSIS — E1151 Type 2 diabetes mellitus with diabetic peripheral angiopathy without gangrene: Secondary | ICD-10-CM | POA: Diagnosis not present

## 2017-03-06 DIAGNOSIS — N183 Chronic kidney disease, stage 3 (moderate): Secondary | ICD-10-CM | POA: Diagnosis not present

## 2017-03-06 DIAGNOSIS — F325 Major depressive disorder, single episode, in full remission: Secondary | ICD-10-CM | POA: Diagnosis not present

## 2017-03-14 DIAGNOSIS — Z23 Encounter for immunization: Secondary | ICD-10-CM | POA: Diagnosis not present

## 2017-05-31 DIAGNOSIS — E1165 Type 2 diabetes mellitus with hyperglycemia: Secondary | ICD-10-CM | POA: Diagnosis not present

## 2017-05-31 DIAGNOSIS — E039 Hypothyroidism, unspecified: Secondary | ICD-10-CM | POA: Diagnosis not present

## 2017-05-31 DIAGNOSIS — Z720 Tobacco use: Secondary | ICD-10-CM | POA: Diagnosis not present

## 2017-05-31 DIAGNOSIS — Z794 Long term (current) use of insulin: Secondary | ICD-10-CM | POA: Diagnosis not present

## 2017-05-31 DIAGNOSIS — E139 Other specified diabetes mellitus without complications: Secondary | ICD-10-CM | POA: Diagnosis not present

## 2017-07-19 DIAGNOSIS — E039 Hypothyroidism, unspecified: Secondary | ICD-10-CM | POA: Diagnosis not present

## 2017-07-24 DIAGNOSIS — H35372 Puckering of macula, left eye: Secondary | ICD-10-CM | POA: Diagnosis not present

## 2017-07-24 DIAGNOSIS — H40013 Open angle with borderline findings, low risk, bilateral: Secondary | ICD-10-CM | POA: Diagnosis not present

## 2017-07-24 DIAGNOSIS — H524 Presbyopia: Secondary | ICD-10-CM | POA: Diagnosis not present

## 2017-07-24 DIAGNOSIS — E113293 Type 2 diabetes mellitus with mild nonproliferative diabetic retinopathy without macular edema, bilateral: Secondary | ICD-10-CM | POA: Diagnosis not present

## 2017-08-07 DIAGNOSIS — L97919 Non-pressure chronic ulcer of unspecified part of right lower leg with unspecified severity: Secondary | ICD-10-CM | POA: Diagnosis not present

## 2017-08-07 DIAGNOSIS — Z1389 Encounter for screening for other disorder: Secondary | ICD-10-CM | POA: Diagnosis not present

## 2017-08-07 DIAGNOSIS — Z0001 Encounter for general adult medical examination with abnormal findings: Secondary | ICD-10-CM | POA: Diagnosis not present

## 2017-08-07 DIAGNOSIS — Z794 Long term (current) use of insulin: Secondary | ICD-10-CM | POA: Diagnosis not present

## 2017-08-07 DIAGNOSIS — E1151 Type 2 diabetes mellitus with diabetic peripheral angiopathy without gangrene: Secondary | ICD-10-CM | POA: Diagnosis not present

## 2017-08-07 DIAGNOSIS — E1122 Type 2 diabetes mellitus with diabetic chronic kidney disease: Secondary | ICD-10-CM | POA: Diagnosis not present

## 2017-08-07 DIAGNOSIS — E7800 Pure hypercholesterolemia, unspecified: Secondary | ICD-10-CM | POA: Diagnosis not present

## 2017-08-07 DIAGNOSIS — I10 Essential (primary) hypertension: Secondary | ICD-10-CM | POA: Diagnosis not present

## 2017-08-07 DIAGNOSIS — I700 Atherosclerosis of aorta: Secondary | ICD-10-CM | POA: Diagnosis not present

## 2017-08-07 DIAGNOSIS — D509 Iron deficiency anemia, unspecified: Secondary | ICD-10-CM | POA: Diagnosis not present

## 2017-08-07 DIAGNOSIS — N183 Chronic kidney disease, stage 3 (moderate): Secondary | ICD-10-CM | POA: Diagnosis not present

## 2017-08-07 DIAGNOSIS — F17210 Nicotine dependence, cigarettes, uncomplicated: Secondary | ICD-10-CM | POA: Diagnosis not present

## 2017-09-11 DIAGNOSIS — E039 Hypothyroidism, unspecified: Secondary | ICD-10-CM | POA: Diagnosis not present

## 2017-11-06 ENCOUNTER — Ambulatory Visit: Payer: Medicare Other

## 2017-11-06 ENCOUNTER — Encounter: Payer: Self-pay | Admitting: Podiatry

## 2017-11-06 ENCOUNTER — Other Ambulatory Visit: Payer: Self-pay

## 2017-11-06 ENCOUNTER — Ambulatory Visit (INDEPENDENT_AMBULATORY_CARE_PROVIDER_SITE_OTHER): Payer: Medicare Other | Admitting: Podiatry

## 2017-11-06 DIAGNOSIS — E1149 Type 2 diabetes mellitus with other diabetic neurological complication: Secondary | ICD-10-CM

## 2017-11-06 DIAGNOSIS — I999 Unspecified disorder of circulatory system: Secondary | ICD-10-CM | POA: Diagnosis not present

## 2017-11-06 DIAGNOSIS — M79675 Pain in left toe(s): Secondary | ICD-10-CM

## 2017-11-06 DIAGNOSIS — M79674 Pain in right toe(s): Secondary | ICD-10-CM | POA: Diagnosis not present

## 2017-11-06 DIAGNOSIS — L84 Corns and callosities: Secondary | ICD-10-CM

## 2017-11-06 DIAGNOSIS — E1140 Type 2 diabetes mellitus with diabetic neuropathy, unspecified: Secondary | ICD-10-CM

## 2017-11-06 DIAGNOSIS — B351 Tinea unguium: Secondary | ICD-10-CM

## 2017-11-06 DIAGNOSIS — M79671 Pain in right foot: Secondary | ICD-10-CM

## 2017-11-08 NOTE — Progress Notes (Signed)
Subjective:   Patient ID: Ariel Cobb, female   DOB: 72 y.o.   MRN: [id number]   HPI Patient presents with thickened nailbeds 1-5 both feet that she cannot cut history of long-term smoking vascular disease corn callus formation right plantar foot metatarsal with long-term diabetes with diminishment of her neurological status.  Patient does smoke a pack per day and is not active   Review of Systems  All other systems reviewed and are negative.       Objective:  Physical Exam  Constitutional: She appears well-developed.  Pulmonary/Chest: Effort normal.  Neurological: She is alert.  Skin: Skin is dry.  Nursing note and vitals reviewed.   Vascular status reveals diminished pulses bilateral with history of claudication that she is aware of and has checked.  She has diminishment of sharp dull vibratory and has significant thickness of her nailbeds 1-5 both feet with incurvation of the corners was also noted to have keratotic lesion sub-metatarsal right that is painful when palpated with no drainage or breakdown noted     Assessment:  Poor health individual who continues to smoke with vascular disease diabetic neuropathic conditions mycotic nail infection with pain and lesion formation right     Plan:  H&P and education rendered concerning condition.  At this point I have recommended debridement and debridement of lesion which was accomplished today.  I explained to her the importance of reducing smoking and trying to exercise and continuing to see her vascular doctor and be followed up for this.  Patient will be seen back 3 months or earlier if any issues should occur

## 2017-11-23 DIAGNOSIS — N183 Chronic kidney disease, stage 3 (moderate): Secondary | ICD-10-CM | POA: Diagnosis not present

## 2017-11-23 DIAGNOSIS — I129 Hypertensive chronic kidney disease with stage 1 through stage 4 chronic kidney disease, or unspecified chronic kidney disease: Secondary | ICD-10-CM | POA: Diagnosis not present

## 2017-11-23 DIAGNOSIS — E1122 Type 2 diabetes mellitus with diabetic chronic kidney disease: Secondary | ICD-10-CM | POA: Diagnosis not present

## 2017-12-04 DIAGNOSIS — E039 Hypothyroidism, unspecified: Secondary | ICD-10-CM | POA: Diagnosis not present

## 2017-12-04 DIAGNOSIS — Z720 Tobacco use: Secondary | ICD-10-CM | POA: Diagnosis not present

## 2017-12-04 DIAGNOSIS — Z794 Long term (current) use of insulin: Secondary | ICD-10-CM | POA: Diagnosis not present

## 2017-12-04 DIAGNOSIS — E139 Other specified diabetes mellitus without complications: Secondary | ICD-10-CM | POA: Diagnosis not present

## 2017-12-21 ENCOUNTER — Other Ambulatory Visit: Payer: Self-pay | Admitting: Cardiovascular Disease

## 2017-12-21 DIAGNOSIS — I739 Peripheral vascular disease, unspecified: Secondary | ICD-10-CM

## 2018-01-01 ENCOUNTER — Ambulatory Visit (HOSPITAL_COMMUNITY)
Admission: RE | Admit: 2018-01-01 | Discharge: 2018-01-01 | Disposition: A | Discharge status: Home / Self Care | Payer: Medicare Other | Source: Ambulatory Visit | Attending: Cardiovascular Disease | Admitting: Cardiovascular Disease

## 2018-01-01 DIAGNOSIS — I739 Peripheral vascular disease, unspecified: Secondary | ICD-10-CM | POA: Diagnosis not present

## 2018-01-03 ENCOUNTER — Other Ambulatory Visit: Payer: Self-pay | Admitting: *Deleted

## 2018-01-17 DIAGNOSIS — H35372 Puckering of macula, left eye: Secondary | ICD-10-CM | POA: Diagnosis not present

## 2018-01-17 DIAGNOSIS — H40013 Open angle with borderline findings, low risk, bilateral: Secondary | ICD-10-CM | POA: Diagnosis not present

## 2018-01-17 DIAGNOSIS — E113293 Type 2 diabetes mellitus with mild nonproliferative diabetic retinopathy without macular edema, bilateral: Secondary | ICD-10-CM | POA: Diagnosis not present

## 2018-02-05 ENCOUNTER — Other Ambulatory Visit: Payer: No Typology Code available for payment source

## 2018-02-05 DIAGNOSIS — I10 Essential (primary) hypertension: Secondary | ICD-10-CM | POA: Diagnosis not present

## 2018-02-05 DIAGNOSIS — N183 Chronic kidney disease, stage 3 (moderate): Secondary | ICD-10-CM | POA: Diagnosis not present

## 2018-02-05 DIAGNOSIS — I700 Atherosclerosis of aorta: Secondary | ICD-10-CM | POA: Diagnosis not present

## 2018-02-05 DIAGNOSIS — E1122 Type 2 diabetes mellitus with diabetic chronic kidney disease: Secondary | ICD-10-CM | POA: Diagnosis not present

## 2018-02-05 DIAGNOSIS — J449 Chronic obstructive pulmonary disease, unspecified: Secondary | ICD-10-CM | POA: Diagnosis not present

## 2018-02-05 DIAGNOSIS — E039 Hypothyroidism, unspecified: Secondary | ICD-10-CM | POA: Diagnosis not present

## 2018-02-05 DIAGNOSIS — M810 Age-related osteoporosis without current pathological fracture: Secondary | ICD-10-CM | POA: Diagnosis not present

## 2018-02-05 DIAGNOSIS — F17210 Nicotine dependence, cigarettes, uncomplicated: Secondary | ICD-10-CM | POA: Diagnosis not present

## 2018-02-05 DIAGNOSIS — E7800 Pure hypercholesterolemia, unspecified: Secondary | ICD-10-CM | POA: Diagnosis not present

## 2018-02-05 DIAGNOSIS — Z794 Long term (current) use of insulin: Secondary | ICD-10-CM | POA: Diagnosis not present

## 2018-02-05 DIAGNOSIS — I739 Peripheral vascular disease, unspecified: Secondary | ICD-10-CM | POA: Diagnosis not present

## 2018-02-05 DIAGNOSIS — E1151 Type 2 diabetes mellitus with diabetic peripheral angiopathy without gangrene: Secondary | ICD-10-CM | POA: Diagnosis not present

## 2018-02-07 ENCOUNTER — Encounter: Payer: Self-pay | Admitting: Podiatry

## 2018-02-07 ENCOUNTER — Ambulatory Visit (INDEPENDENT_AMBULATORY_CARE_PROVIDER_SITE_OTHER): Payer: Medicare Other | Admitting: Podiatry

## 2018-02-07 DIAGNOSIS — I999 Unspecified disorder of circulatory system: Secondary | ICD-10-CM

## 2018-02-07 DIAGNOSIS — B351 Tinea unguium: Secondary | ICD-10-CM | POA: Diagnosis not present

## 2018-02-07 DIAGNOSIS — E1140 Type 2 diabetes mellitus with diabetic neuropathy, unspecified: Secondary | ICD-10-CM | POA: Diagnosis not present

## 2018-02-07 DIAGNOSIS — M79674 Pain in right toe(s): Secondary | ICD-10-CM

## 2018-02-07 DIAGNOSIS — L84 Corns and callosities: Secondary | ICD-10-CM | POA: Diagnosis not present

## 2018-02-07 DIAGNOSIS — E1149 Type 2 diabetes mellitus with other diabetic neurological complication: Secondary | ICD-10-CM | POA: Diagnosis not present

## 2018-02-07 DIAGNOSIS — M79675 Pain in left toe(s): Secondary | ICD-10-CM

## 2018-02-07 NOTE — Progress Notes (Signed)
Subjective:   Patient ID: Ariel Cobb, female   DOB: 72 y.o.   MRN: [id number]   HPI Patient presents with elongated nailbeds 1-5 both feet that she cannot take care of and they get sore   ROS      Objective:  Physical Exam  Neurovascular status intact with thick yellow brittle nailbeds 1-5 both feet that are painful     Assessment:  Mycotic nail infection with pain 1-5 both feet     Plan:  Debride painful nailbeds 1-5 both feet no iatrogenic bleeding reappoint to check back

## 2018-03-19 DIAGNOSIS — Z23 Encounter for immunization: Secondary | ICD-10-CM | POA: Diagnosis not present

## 2018-05-10 ENCOUNTER — Ambulatory Visit (INDEPENDENT_AMBULATORY_CARE_PROVIDER_SITE_OTHER): Payer: Medicare Other | Admitting: Podiatry

## 2018-05-10 ENCOUNTER — Encounter: Payer: Self-pay | Admitting: Podiatry

## 2018-05-10 DIAGNOSIS — M79674 Pain in right toe(s): Secondary | ICD-10-CM

## 2018-05-10 DIAGNOSIS — M79675 Pain in left toe(s): Secondary | ICD-10-CM | POA: Diagnosis not present

## 2018-05-10 DIAGNOSIS — E1151 Type 2 diabetes mellitus with diabetic peripheral angiopathy without gangrene: Secondary | ICD-10-CM | POA: Diagnosis not present

## 2018-05-10 DIAGNOSIS — B351 Tinea unguium: Secondary | ICD-10-CM | POA: Diagnosis not present

## 2018-05-10 DIAGNOSIS — L84 Corns and callosities: Secondary | ICD-10-CM

## 2018-05-10 NOTE — Progress Notes (Signed)
Subjective: Ariel Cobb is a 72 y.o. y.o. female who presents to clinic today with history of diabetes, diabetic neuropathy and cc of painful, discolored, thick toenails and painful pre-ulcerative calluses both feet.  She states that is always been hard for her to shoes because of her narrow heels.  She states she has been having car problems lately.  Objective: Vascular Examination: Capillary refill time less than 3 seconds to all 10 digits. Dorsalis pedis pulses 0/4 right, faintly palpable left Posterior tibial pulses 0 /4 bilaterally No pedal edema bilaterally. Skin temperature gradient within normal limits bilaterally  Dermatological Examination: Skin skin is mildly atrophic bilaterally. Toenails 1-5 b/l discolored, thick, dystrophic with subungual debris and pain with palpation to nailbeds due to thickness of nails. Hyperkeratotic lesion noted submetatarsal head 1 right foot Hyperkeratotic lesion sub-central heel bilaterally  Musculoskeletal: Muscle strength 5/5 to all LE muscle groups  Neurological: Sensation diminished with 10 gram monofilament. Vibratory sensation diminished  Assessment: 1. Painful onychomycosis toenails 1-5 b/l 2. Calluses x3 3. NIDDM with Peripheral arterial disease 4. Diabetic neuropathy  Plan: 1. Continue diabetic foot care principles. Literature dispensed. 2. Toenails 1-5 b/l were debrided in length and girth without iatrogenic bleeding. 3. Hyperkeratotic lesion(s)  pared submetatarsal head 1 right foot and sub-central heel bilaterally with sterile chisel blade and gently smoothed with burr.  A pair of silicone heel cups were dispensed to patient to minimize formation of heel calluses bilaterally  4. Patient to report any pedal injuries to medical professional  5. Follow up 3 months. Patient/POA to call should there be a concern in the interim.

## 2018-05-10 NOTE — Patient Instructions (Signed)

## 2018-06-13 DIAGNOSIS — Z794 Long term (current) use of insulin: Secondary | ICD-10-CM | POA: Diagnosis not present

## 2018-06-13 DIAGNOSIS — Z720 Tobacco use: Secondary | ICD-10-CM | POA: Diagnosis not present

## 2018-06-13 DIAGNOSIS — E1122 Type 2 diabetes mellitus with diabetic chronic kidney disease: Secondary | ICD-10-CM | POA: Diagnosis not present

## 2018-06-13 DIAGNOSIS — E039 Hypothyroidism, unspecified: Secondary | ICD-10-CM | POA: Diagnosis not present

## 2018-06-13 DIAGNOSIS — E139 Other specified diabetes mellitus without complications: Secondary | ICD-10-CM | POA: Diagnosis not present

## 2018-08-09 ENCOUNTER — Ambulatory Visit: Payer: Medicare Other | Admitting: Podiatry

## 2018-08-23 ENCOUNTER — Telehealth: Payer: Self-pay | Admitting: Cardiovascular Disease

## 2018-08-23 DIAGNOSIS — R002 Palpitations: Secondary | ICD-10-CM

## 2018-08-23 NOTE — Telephone Encounter (Signed)
Put a 2-week ZIO patch on her prior to her office visit place

## 2018-08-23 NOTE — Telephone Encounter (Signed)
STAT if HR is under 50 or over 120 (normal HR is 60-100 beats per minute)  1) What is your heart rate? he did not know- she says it go real fast for about 30 minutes to an hour- ss 2) Do you have a log of your heart rate readings (document readings)?  3) Do you have any other symptoms? Tired if happen when she is up and about- no symptoms if it happen when she is laying down or sitting

## 2018-08-23 NOTE — Telephone Encounter (Signed)
Spoke with pt and over the last 4-5 months has had 3 episodes of fast heart rate lasting anywhere from 15 min to a hour no other symptoms just (weird feeling) did have death in family 2 weeks ago has been only stressors. Endo did labs in Dec 2019 and no increase in caffeine. Per pt has a brother that suffers from a fib Pt has appt with Dr Gwenlyn Found in March .Will forward to Dr Gwenlyn Found for review .Adonis Housekeeper

## 2018-08-27 ENCOUNTER — Telehealth: Payer: Self-pay | Admitting: *Deleted

## 2018-08-27 NOTE — Telephone Encounter (Signed)
Left message for patient to call and schedule 14 day ZIO monitor ordered by Dr. Gwenlyn Found

## 2018-08-28 ENCOUNTER — Ambulatory Visit (INDEPENDENT_AMBULATORY_CARE_PROVIDER_SITE_OTHER): Payer: Medicare Other | Admitting: Podiatry

## 2018-08-28 DIAGNOSIS — E1151 Type 2 diabetes mellitus with diabetic peripheral angiopathy without gangrene: Secondary | ICD-10-CM

## 2018-08-28 DIAGNOSIS — L84 Corns and callosities: Secondary | ICD-10-CM | POA: Diagnosis not present

## 2018-08-28 DIAGNOSIS — E1140 Type 2 diabetes mellitus with diabetic neuropathy, unspecified: Secondary | ICD-10-CM

## 2018-08-28 DIAGNOSIS — B351 Tinea unguium: Secondary | ICD-10-CM | POA: Diagnosis not present

## 2018-08-28 DIAGNOSIS — M79674 Pain in right toe(s): Secondary | ICD-10-CM | POA: Diagnosis not present

## 2018-08-28 DIAGNOSIS — E1149 Type 2 diabetes mellitus with other diabetic neurological complication: Secondary | ICD-10-CM | POA: Diagnosis not present

## 2018-08-28 DIAGNOSIS — M79675 Pain in left toe(s): Secondary | ICD-10-CM | POA: Diagnosis not present

## 2018-08-28 NOTE — Patient Instructions (Signed)
Onychomycosis/Fungal Toenails  WHAT IS IT? An infection that lies within the keratin of your nail plate that is caused by a fungus.  WHY ME? Fungal infections affect all ages, sexes, races, and creeds.  There may be many factors that predispose you to a fungal infection such as age, coexisting medical conditions such as diabetes, or an autoimmune disease; stress, medications, fatigue, genetics, etc.  Bottom line: fungus thrives in a warm, moist environment and your shoes offer such a location.  IS IT CONTAGIOUS? Theoretically, yes.  You do not want to share shoes, nail clippers or files with someone who has fungal toenails.  Walking around barefoot in the same room or sleeping in the same bed is unlikely to transfer the organism.  It is important to realize, however, that fungus can spread easily from one nail to the next on the same foot.  HOW DO WE TREAT THIS?  There are several ways to treat this condition.  Treatment may depend on many factors such as age, medications, pregnancy, liver and kidney conditions, etc.  It is best to ask your doctor which options are available to you.  1. No treatment.   Unlike many other medical concerns, you can live with this condition.  However for many people this can be a painful condition and may lead to ingrown toenails or a bacterial infection.  It is recommended that you keep the nails cut short to help reduce the amount of fungal nail. 2. Topical treatment.  These range from herbal remedies to prescription strength nail lacquers.  About 40-50% effective, topicals require twice daily application for approximately 9 to 12 months or until an entirely new nail has grown out.  The most effective topicals are medical grade medications available through physicians offices. 3. Oral antifungal medications.  With an 80-90% cure rate, the most common oral medication requires 3 to 4 months of therapy and stays in your system for a year as the new nail grows out.  Oral  antifungal medications do require blood work to make sure it is a safe drug for you.  A liver function panel will be performed prior to starting the medication and after the first month of treatment.  It is important to have the blood work performed to avoid any harmful side effects.  In general, this medication safe but blood work is required. 4. Laser Therapy.  This treatment is performed by applying a specialized laser to the affected nail plate.  This therapy is noninvasive, fast, and non-painful.  It is not covered by insurance and is therefore, out of pocket.  The results have been very good with a 80-95% cure rate.  [facility] is the only practice in the area to offer this therapy. 5. Permanent Nail Avulsion.  Removing the entire nail so that a new nail will not grow back.  Corns and Calluses Corns are small areas of thickened skin that occur on the top, sides, or tip of a toe. They contain a cone-shaped core with a point that can press on a nerve below. This causes pain.  Calluses are areas of thickened skin that can occur anywhere on the body, including the hands, fingers, palms, soles of the feet, and heels. Calluses are usually larger than corns. What are the causes? Corns and calluses are caused by rubbing (friction) or pressure, such as from shoes that are too tight or do not fit properly. What increases the risk? Corns are more likely to develop in people   who have misshapen toes (toe deformities), such as hammer toes. Calluses can occur with friction to any area of the skin. They are more likely to develop in people who:  Work with their hands.  Wear shoes that fit poorly, are too tight, or are high-heeled.  Have toe deformities. What are the signs or symptoms? Symptoms of a corn or callus include:  A hard growth on the skin.  Pain or tenderness under the skin.  Redness and swelling.  Increased discomfort while wearing tight-fitting shoes, if your feet are  affected. If a corn or callus becomes infected, symptoms may include:  Redness and swelling that gets worse.  Pain.  Fluid, blood, or pus draining from the corn or callus. How is this diagnosed? Corns and calluses may be diagnosed based on your symptoms, your medical history, and a physical exam. How is this treated? Treatment for corns and calluses may include:  Removing the cause of the friction or pressure. This may involve: ? Changing your shoes. ? Wearing shoe inserts (orthotics) or other protective layers in your shoes, such as a corn pad. ? Wearing gloves.  Applying medicine to the skin (topical medicine) to help soften skin in the hardened, thickened areas.  Removing layers of dead skin with a file to reduce the size of the corn or callus.  Removing the corn or callus with a scalpel or laser.  Taking antibiotic medicines, if your corn or callus is infected.  Having surgery, if a toe deformity is the cause. Follow these instructions at home:   Take over-the-counter and prescription medicines only as told by your health care provider.  If you were prescribed an antibiotic, take it as told by your health care provider. Do not stop taking it even if your condition starts to improve.  Wear shoes that fit well. Avoid wearing high-heeled shoes and shoes that are too tight or too loose.  Wear any padding, protective layers, gloves, or orthotics as told by your health care provider.  Soak your hands or feet and then use a file or pumice stone to soften your corn or callus. Do this as told by your health care provider.  Check your corn or callus every day for symptoms of infection. Contact a health care provider if you:  Notice that your symptoms do not improve with treatment.  Have redness or swelling that gets worse.  Notice that your corn or callus becomes painful.  Have fluid, blood, or pus coming from your corn or callus.  Have new symptoms. Summary  Corns are  small areas of thickened skin that occur on the top, sides, or tip of a toe.  Calluses are areas of thickened skin that can occur anywhere on the body, including the hands, fingers, palms, and soles of the feet. Calluses are usually larger than corns.  Corns and calluses are caused by rubbing (friction) or pressure, such as from shoes that are too tight or do not fit properly.  Treatment may include wearing any padding, protective layers, gloves, or orthotics as told by your health care provider. This information is not intended to replace advice given to you by your health care provider. Make sure you discuss any questions you have with your health care provider. Document Released: 03/19/2004 Document Revised: 04/26/2017 Document Reviewed: 04/26/2017 Elsevier Interactive Patient Education  2019 Else[employer]  Diabetes Mellitus and Foot Care Foot care is an important part of your health, especially when you have diabetes. Diabetes may cause you to have   problems because of poor blood flow (circulation) to your feet and legs, which can cause your skin to: Become thinner and drier. Break more easily. Heal more slowly. Peel and crack. You may also have nerve damage (neuropathy) in your legs and feet, causing decreased feeling in them. This means that you may not notice minor injuries to your feet that could lead to more serious problems. Noticing and addressing any potential problems early is the best way to prevent future foot problems. How to care for your feet Foot hygiene Wash your feet daily with warm water and mild soap. Do not use hot water. Then, pat your feet and the areas between your toes until they are completely dry. Do not soak your feet as this can dry your skin. Trim your toenails straight across. Do not dig under them or around the cuticle. File the edges of your nails with an emery board or nail file. Apply a moisturizing lotion or petroleum jelly to the skin on your feet and to  dry, brittle toenails. Use lotion that does not contain alcohol and is unscented. Do not apply lotion between your toes. Shoes and socks Wear clean socks or stockings every day. Make sure they are not too tight. Do not wear knee-high stockings since they may decrease blood flow to your legs. Wear shoes that fit properly and have enough cushioning. Always look in your shoes before you put them on to be sure there are no objects inside. To break in new shoes, wear them for just a few hours a day. This prevents injuries on your feet. Wounds, scrapes, corns, and calluses Check your feet daily for blisters, cuts, bruises, sores, and redness. If you cannot see the bottom of your feet, use a mirror or ask someone for help. Do not cut corns or calluses or try to remove them with medicine. If you find a minor scrape, cut, or break in the skin on your feet, keep it and the skin around it clean and dry. You may clean these areas with mild soap and water. Do not clean the area with peroxide, alcohol, or iodine. If you have a wound, scrape, corn, or callus on your foot, look at it several times a day to make sure it is healing and not infected. Check for: Redness, swelling, or pain. Fluid or blood. Warmth. Pus or a bad smell. General instructions Do not cross your legs. This may decrease blood flow to your feet. Do not use heating pads or hot water bottles on your feet. They may burn your skin. If you have lost feeling in your feet or legs, you may not know this is happening until it is too late. Protect your feet from hot and cold by wearing shoes, such as at the beach or on hot pavement. Schedule a complete foot exam at least once a year (annually) or more often if you have foot problems. If you have foot problems, report any cuts, sores, or bruises to your health care provider immediately. Contact a health care provider if: You have a medical condition that increases your risk of infection and you have any  cuts, sores, or bruises on your feet. You have an injury that is not healing. You have redness on your legs or feet. You feel burning or tingling in your legs or feet. You have pain or cramps in your legs and feet. Your legs or feet are numb. Your feet always feel cold. You have pain around a toenail. Get help   right away if: You have a wound, scrape, corn, or callus on your foot and: You have pain, swelling, or redness that gets worse. You have fluid or blood coming from the wound, scrape, corn, or callus. Your wound, scrape, corn, or callus feels warm to the touch. You have pus or a bad smell coming from the wound, scrape, corn, or callus. You have a fever. You have a red line going up your leg. Summary Check your feet every day for cuts, sores, red spots, swelling, and blisters. Moisturize feet and legs daily. Wear shoes that fit properly and have enough cushioning. If you have foot problems, report any cuts, sores, or bruises to your health care provider immediately. Schedule a complete foot exam at least once a year (annually) or more often if you have foot problems. This information is not intended to replace advice given to you by your health care provider. Make sure you discuss any questions you have with your health care provider. Document Released: 06/10/2000 Document Revised: 07/26/2017 Document Reviewed: 07/15/2016 Elsevier Interactive Patient Education  2019 Elsevier Inc.  

## 2018-09-09 ENCOUNTER — Encounter: Payer: Self-pay | Admitting: Podiatry

## 2018-09-09 NOTE — Progress Notes (Signed)
Subjective: Ariel Cobb is a 73 y.o. y.o. female  who presents for preventative foot care today with diabetes and PAD and cc of painful, discolored, thick toenails and painful callus/corn which interfere with daily activities. Pain is aggravated when wearing enclosed shoe gear. Pain is relieved with periodic professional debridement.  Seward Carol, MD is her PCP.    Current Outpatient Medications:  .  albuterol (PROVENTIL HFA;VENTOLIN HFA) 108 (90 BASE) MCG/ACT inhaler, Inhale into the lungs every 6 (six) hours as needed for wheezing or shortness of breath., Disp: , Rfl:  .  aspirin EC 81 MG tablet, Take 81 mg by mouth daily., Disp: , Rfl:  .  atorvastatin (LIPITOR) 20 MG tablet, Take 20 mg by mouth every morning. , Disp: , Rfl:  .  budesonide-formoterol (SYMBICORT) 160-4.5 MCG/ACT inhaler, Inhale 2 puffs into the lungs 2 (two) times daily., Disp: , Rfl:  .  Calcium Carb-Cholecalciferol (CALCIUM 600/VITAMIN D3) 600-800 MG-UNIT TABS, Take 1 tablet by mouth daily. , Disp: , Rfl:  .  doxycycline (VIBRA-TABS) 100 MG tablet, Take 100 mg by mouth 2 (two) times daily. for 10 days, Disp: , Rfl: 0 .  ferrous sulfate 325 (65 FE) MG tablet, Take 325 mg by mouth every other day., Disp: , Rfl:  .  hydrochlorothiazide (HYDRODIURIL) 25 MG tablet, Take 25 mg by mouth daily., Disp: , Rfl: 2 .  Insulin Glargine (LANTUS SOLOSTAR) 100 UNIT/ML Solostar Pen, Inject 9 Units into the skin daily at 2 PM. 11 units for blood sugar < 400 and 11+ units for blood sugar >= 400, Disp: , Rfl:  .  levothyroxine (SYNTHROID, LEVOTHROID) 125 MCG tablet, TAKE 1 TABLET BY MOUTH EVERY DAY IN THE MORNING ON EMPTY STOMACH, Disp: , Rfl: 2 .  NOVOLOG FLEXPEN 100 UNIT/ML FlexPen, Inject 3-4 Units into the skin 3 (three) times daily. , Disp: , Rfl: 11 .  ONE TOUCH ULTRA TEST test strip, TEST THREE TIMES DAILY E11.22, Disp: , Rfl: 4 .  ramipril (ALTACE) 10 MG capsule, Take 20 mg by mouth daily at 2 PM. , Disp: , Rfl:  .  sodium  chloride (OCEAN) 0.65 % SOLN nasal spray, Place 1 spray into both nostrils as needed for congestion., Disp: , Rfl:   Allergies  Allergen Reactions  . Adhesive [Tape] Itching    Surgical Tape - "makes me itch and break me out" -- HAS TO USE PAPER TAPE  . Amoxicillin Hives, Itching and Swelling  . Latex Itching    Objective: Vascular Examination: Capillary refill time <3 seconds x 10 digits  Dorsalis pedis pulses 0/4 right, faintly palpable left   Posterior tibial pulses absent b/l  No digital hair x 10 digits  Skin temperature gradient WNL b/l  Dermatological Examination: Skin thin, shiny and atrophic b/l  Toenails 1-5 b/l discolored, thick, dystrophic with subungual debris and pain with palpation to nailbeds due to thickness of nails.  Hyperkeratotic lesion noted submetatarsal head1 right foot nd subcentral heel b/l.  Musculoskeletal: Muscle strength 5/5 to all LE muscle groups  Neurological: Sensation diminished with 10 gram monofilament.  Vibratory sensation diminished   Assessment: 1. Painful onychomycosis toenails 1-5 b/l 2. Calluses submetatarsal head1 right foot nd subcentral heel b/l 3. NIDDM with Peripheral arterial disease  Plan: 1. Continue diabetic foot care principles. Literature dispensed. 2. Toenails 1-5 b/l were debrided in length and girth without iatrogenic bleeding. 3. Hyperkeratotic lesion(s)  pared with sterile scalpel blade and gently smoothed with burr submetatarsal head1 right foot and subcentral  heel b/l without incident. 4. Patient to continue soft, supportive shoe gear 5. Patient to report any pedal injuries to medical professional  6. Follow up 3 months.  7. Patient/POA to call should there be a concern in the interim.

## 2018-09-14 ENCOUNTER — Ambulatory Visit: Payer: Medicare Other | Admitting: Cardiovascular Disease

## 2018-09-17 ENCOUNTER — Encounter: Payer: Self-pay | Admitting: Cardiovascular Disease

## 2018-09-17 DIAGNOSIS — Z0000 Encounter for general adult medical examination without abnormal findings: Secondary | ICD-10-CM | POA: Diagnosis not present

## 2018-09-17 DIAGNOSIS — N183 Chronic kidney disease, stage 3 (moderate): Secondary | ICD-10-CM | POA: Diagnosis not present

## 2018-09-17 DIAGNOSIS — Z1389 Encounter for screening for other disorder: Secondary | ICD-10-CM | POA: Diagnosis not present

## 2018-09-17 DIAGNOSIS — D638 Anemia in other chronic diseases classified elsewhere: Secondary | ICD-10-CM | POA: Diagnosis not present

## 2018-09-17 DIAGNOSIS — R002 Palpitations: Secondary | ICD-10-CM | POA: Diagnosis not present

## 2018-09-17 DIAGNOSIS — F325 Major depressive disorder, single episode, in full remission: Secondary | ICD-10-CM | POA: Diagnosis not present

## 2018-09-17 DIAGNOSIS — F17210 Nicotine dependence, cigarettes, uncomplicated: Secondary | ICD-10-CM | POA: Diagnosis not present

## 2018-09-17 DIAGNOSIS — M810 Age-related osteoporosis without current pathological fracture: Secondary | ICD-10-CM | POA: Diagnosis not present

## 2018-09-17 DIAGNOSIS — Z7189 Other specified counseling: Secondary | ICD-10-CM | POA: Diagnosis not present

## 2018-09-17 DIAGNOSIS — I10 Essential (primary) hypertension: Secondary | ICD-10-CM | POA: Diagnosis not present

## 2018-09-17 DIAGNOSIS — I739 Peripheral vascular disease, unspecified: Secondary | ICD-10-CM | POA: Diagnosis not present

## 2018-09-18 ENCOUNTER — Telehealth: Payer: Self-pay | Admitting: Radiology

## 2018-09-18 NOTE — Telephone Encounter (Signed)
Zio Long Term Monitor was ordered not an Event monitor

## 2018-09-18 NOTE — Telephone Encounter (Signed)
Event Monitor was enrolled to be mailed to the patient on 3-24 due to covid-19 

## 2018-09-20 ENCOUNTER — Ambulatory Visit (INDEPENDENT_AMBULATORY_CARE_PROVIDER_SITE_OTHER): Payer: Medicare Other

## 2018-09-20 DIAGNOSIS — R002 Palpitations: Secondary | ICD-10-CM

## 2018-10-03 ENCOUNTER — Telehealth: Payer: Self-pay | Admitting: *Deleted

## 2018-10-03 NOTE — Telephone Encounter (Addendum)
   Cardiac Questionnaire:    Since your last visit or hospitalization:    1. Have you been having new or worsening chest pain? NO   2. Have you been having new or worsening shortness of breath? NO 3. Have you been having new or worsening leg swelling, wt gain, or increase in abdominal girth (pants fitting more tightly)? Leg pain some times at night.   4. Have you had any passing out spells? NO    *A YES to any of these questions would result in the appointment being kept. *If all the answers to these questions are NO, we should indicate that given the current situation regarding the worldwide coronarvirus pandemic, at the recommendation of the CDC, we are looking to limit gatherings in our waiting area, and thus will reschedule their appointment beyond four weeks from today.   _____________   [id number] Pre-Screening Questions:  . Do you currently have a fever? NO . Have you recently travelled on a cruise, internationally, or to [place], [place], [place], [place], [place], or [place], [place] [employer])? NO . Have you been in contact with someone that is currently pending confirmation of Covid19 testing or has been confirmed to have the [place] virus? NO Are you currently experiencing fatigue or cough? NO  Patient says that she has been having episodes of her heart beating fast as well as some skipped beats.  Patient would like to reschedule her appointment for Friday October 05, 2018 due to the covid19 crisis and because she is still wearing the event monitor. I told her someone would be calling to reschedule that appointment.

## 2018-10-05 ENCOUNTER — Ambulatory Visit: Payer: Medicare Other | Admitting: Cardiovascular Disease

## 2018-10-11 ENCOUNTER — Telehealth: Payer: Self-pay | Admitting: Cardiovascular Disease

## 2018-10-11 DIAGNOSIS — R002 Palpitations: Secondary | ICD-10-CM | POA: Diagnosis not present

## 2018-10-11 NOTE — Telephone Encounter (Signed)
Per Dr Percival Spanish recording may be reviewed tomorrow by Dr Gwenlyn Found. Will put tracings in Dr Kennon Holter mail box .Adonis Housekeeper

## 2018-10-11 NOTE — Telephone Encounter (Signed)
Received call from Rule with [facility] scheduling  Who had Ariel Cobb from [place] on the line regarding  an abnormal EKG.  THe patient is wearing a ZIO patch and it shows afib at 184 bpm > 60 seconds.  He is going to fax the report to [phone].

## 2018-10-11 NOTE — Telephone Encounter (Signed)
Kyle from [employer] called to give report Zio patch. Called transferred to triage.

## 2018-10-12 ENCOUNTER — Other Ambulatory Visit: Payer: Self-pay

## 2018-10-15 NOTE — Telephone Encounter (Signed)
Spoke with pt and pt aware of Dr. Gwenlyn Found recommendation for telemedicine visit. Appt rescheduled for 4/21 at 1400. Pt agreeable with this     Virtual Visit Pre-Appointment Phone Call  Steps For Call:  1. Confirm consent - "In the setting of the current Covid19 crisis, you are scheduled for a (phone or video) visit with your provider on (date) at (time).  Just as we do with many in-office visits, in order for you to participate in this visit, we must obtain consent.  If you'd like, I can send this to your mychart (if signed up) or email for you to review.  Otherwise, I can obtain your verbal consent now.  All virtual visits are billed to your insurance company just like a normal visit would be.  By agreeing to a virtual visit, we'd like you to understand that the technology does not allow for your provider to perform an examination, and thus may limit your provider's ability to fully assess your condition. If your provider identifies any concerns that need to be evaluated in person, we will make arrangements to do so.  Finally, though the technology is pretty good, we cannot assure that it will always work on either your or our end, and in the setting of a video visit, we may have to convert it to a phone-only visit.  In either situation, we cannot ensure that we have a secure connection.  Are you willing to proceed?" STAFF: Did the patient verbally acknowledge consent to telehealth visit? Document YES/NO here: yes  2. Confirm the BEST phone number to call the day of the visit by including in appointment notes  3. Give patient instructions for MyChart download to smartphone OR Doximity/Doxy.me as below if video visit (depending on what platform provider is using)  4. Confirm that appointment type is correct in Epic appointment notes (VIDEO vs PHONE)  5. Advise patient to be prepared with their blood pressure, heart rate, weight, any heart rhythm information, their current medicines, and a piece of  paper and pen handy for any instructions they may receive the day of their visit  6. Inform patient they will receive a phone call 15 minutes prior to their appointment time (may be from unknown caller ID) so they should be prepared to answer    TELEPHONE CALL NOTE  Ariel Cobb has been deemed a candidate for a follow-up tele-health visit to limit community exposure during the Covid-19 pandemic. I spoke with the patient via phone to ensure availability of phone/video source, confirm preferred email & phone number, and discuss instructions and expectations.  I reminded BRITTNEI JAGIELLO to be prepared with any vital sign and/or heart rhythm information that could potentially be obtained via home monitoring, at the time of her visit. I reminded BIANCA RANERI to expect a phone call prior to her visit.  Annita Brod, RN 10/15/2018 7:44 PM   INSTRUCTIONS FOR DOWNLOADING THE MYCHART APP TO SMARTPHONE  - The patient must first make sure to have activated MyChart and know their login information - If Apple, go to [employer] and type in MyChart in the search bar and download the app. If Android, ask patient to go to [employer] and type in [place] in the search bar and download the app. The app is free but as with any other app downloads, their phone may require them to verify saved payment information or Apple/Android password.  - The patient will need to then log into the  app with their MyChart username and password, and select [facility] as their healthcare provider to link the account. When it is time for your visit, go to the MyChart app, find appointments, and click Begin Video Visit. Be sure to Select Allow for your device to access the Microphone and Camera for your visit. You will then be connected, and your provider will be with you shortly.  **If they have any issues connecting, or need assistance please contact MyChart service desk [phone])**  **If using a  computer, in order to ensure the best quality for their visit they will need to use either of the following Internet Browsers: [employer], or Google Chrome**  IF USING DOXIMITY or DOXY.ME - The patient will receive a link just prior to their visit by text.     FULL LENGTH CONSENT FOR TELE-HEALTH VISIT   I hereby voluntarily request, consent and authorize [facility] and its employed or contracted physicians, physician assistants, nurse practitioners or other licensed health care professionals (the Practitioner), to provide me with telemedicine health care services (the Services") as deemed necessary by the treating Practitioner. I acknowledge and consent to receive the Services by the Practitioner via telemedicine. I understand that the telemedicine visit will involve communicating with the Practitioner through live audiovisual communication technology and the disclosure of certain medical information by electronic transmission. I acknowledge that I have been given the opportunity to request an in-person assessment or other available alternative prior to the telemedicine visit and am voluntarily participating in the telemedicine visit.  I understand that I have the right to withhold or withdraw my consent to the use of telemedicine in the course of my care at any time, without affecting my right to future care or treatment, and that the Practitioner or I may terminate the telemedicine visit at any time. I understand that I have the right to inspect all information obtained and/or recorded in the course of the telemedicine visit and may receive copies of available information for a reasonable fee.  I understand that some of the potential risks of receiving the Services via telemedicine include:   Delay or interruption in medical evaluation due to technological equipment failure or disruption;  Information transmitted may not be sufficient (e.g. poor resolution of images) to allow for  appropriate medical decision making by the Practitioner; and/or   In rare instances, security protocols could fail, causing a breach of personal health information.  Furthermore, I acknowledge that it is my responsibility to provide information about my medical history, conditions and care that is complete and accurate to the best of my ability. I acknowledge that Practitioner's advice, recommendations, and/or decision may be based on factors not within their control, such as incomplete or inaccurate data provided by me or distortions of diagnostic images or specimens that may result from electronic transmissions. I understand that the practice of medicine is not an exact science and that Practitioner makes no warranties or guarantees regarding treatment outcomes. I acknowledge that I will receive a copy of this consent concurrently upon execution via email to the email address I last provided but may also request a printed copy by calling the office of [facility].    I understand that my insurance will be billed for this visit.   I have read or had this consent read to me.  I understand the contents of this consent, which adequately explains the benefits and risks of the Services being provided via telemedicine.   I have been  provided ample opportunity to ask questions regarding this consent and the Services and have had my questions answered to my satisfaction.  I give my informed consent for the services to be provided through the use of telemedicine in my medical care  By participating in this telemedicine visit I agree to the above.

## 2018-10-16 ENCOUNTER — Encounter: Payer: Self-pay | Admitting: Cardiovascular Disease

## 2018-10-16 ENCOUNTER — Telehealth: Payer: Self-pay

## 2018-10-16 ENCOUNTER — Telehealth (INDEPENDENT_AMBULATORY_CARE_PROVIDER_SITE_OTHER): Payer: Medicare Other | Admitting: Cardiovascular Disease

## 2018-10-16 VITALS — BP 149/73 | HR 81 | Ht 65.0 in | Wt 108.4 lb

## 2018-10-16 DIAGNOSIS — Z720 Tobacco use: Secondary | ICD-10-CM | POA: Diagnosis not present

## 2018-10-16 DIAGNOSIS — R002 Palpitations: Secondary | ICD-10-CM | POA: Diagnosis not present

## 2018-10-16 DIAGNOSIS — E782 Mixed hyperlipidemia: Secondary | ICD-10-CM

## 2018-10-16 DIAGNOSIS — I739 Peripheral vascular disease, unspecified: Secondary | ICD-10-CM | POA: Diagnosis not present

## 2018-10-16 DIAGNOSIS — I10 Essential (primary) hypertension: Secondary | ICD-10-CM

## 2018-10-16 NOTE — Progress Notes (Signed)
Virtual Visit via Telephone Note   This visit type was conducted due to national recommendations for restrictions regarding the COVID-19 Pandemic (e.g. social distancing) in an effort to limit this patient's exposure and mitigate transmission in our community.  Due to her co-morbid illnesses, this patient is at least at moderate risk for complications without adequate follow up.  This format is felt to be most appropriate for this patient at this time.  The patient did not have access to video technology/had technical difficulties with video requiring transitioning to audio format only (telephone).  All issues noted in this document were discussed and addressed.  No physical exam could be performed with this format.  Please refer to the patient's chart for her  consent to telehealth for [facility].   Evaluation Performed:  Follow-up visit  Date:  10/16/2018   ID:  Ariel Cobb, DOB 04-02-1946, MRN [id number]  Patient Location: Home Provider Location: Office  PCP:  Seward Carol, MD  Cardiologist: Dr. Quay Burow Electrophysiologist:  None   Chief Complaint: Palpitations  History of Present Illness:    Ariel Cobb is a  73 year old thin appearing widowed Caucasian female no children and who lives alone. I last saw her in the office  11/25/2016. She is accompanied by one of her sisters today.She was referred by Dr. Ila Mcgill at [facility] for evaluation and treatment of claudication. Her primary care physician is Dr. Alta Corning at [facility]. Her cardiovascular risk factors include a 50-pack-year history tobacco abuse currently smoking one pack per day, 2 hypertension, diabetes and hyperlipidemia. Her father died suddenly of microinfarction at age 76. She has never had a heart attack or stroke patient is compared to sleep denies chest pain. She complains of right calf claudication with recent arterial Dopplers performed in our office on 04/25/13 revealing a right ABI 0.93  with a high-frequency signal in the right popliteal artery. She underwent angiography on 12/9 revealing a high-grade right popliteal artery stenosis with three-vessel runoff. Because of moderate renal deficiency her intervention was staged until today.I performed angiography and intervention on her 2 /2/15. She had a 99% calcified lesion in the right popliteal artery but I performed diamondback orbital rotational atherectomy on. Her symptoms of claudication resolved after her procedure and her Dopplers normalized. Her most recent Dopplers performed 02/05/14 revealed ABIs of 1 bilaterally with a widely patent right popliteal artery.  Since I saw her in the office almost 2 years ago she is done well.  She still denies claudication.  Her recent lower extremity arterial Doppler studies performed in July of last year revealed normal ABIs with moderate increased velocities in the distal right SFA.  She was complaining of some tachypalpitations and had a monitor performed in our office 09/20/2018 that showed episodes of PSVT, nonsustained ventricular tachycardia and PAF.  She did have blood work performed by her PCP that showed a TSH 2.03 suggesting that she was hyperthyroid.  Her thyroid replacement therapy was down titrated.  Since that time she no longer is experiencing palpitations.  The patient does not have symptoms concerning for COVID-19 infection (fever, chills, cough, or new shortness of breath).    Past Medical History:  Diagnosis Date   Anemia    slight told feb dr polite   Arthritis    Asthma    Chronic bronchitis ([place])    Chronic kidney disease    sees dr sanford yearly stage 3 stable   Claudication ([place])    Complication of anesthesia  2000   slow to awaken after hip surgery   Hyperlipidemia    Hypertension    Hypothyroidism    Osteoporosis    PAD (peripheral artery disease) ([place])    a. 07/29/13: s/p PV angiogram with successful diamondback orbital rotational atherectomy of the  high-grade calcified R popliteal artery stenosis   Shortness of breath    on exertion   Thyroid disease    Type II diabetes mellitus [facility])    Past Surgical History:  Procedure Laterality Date   ABDOMINAL AORTAGRAM  06/04/2013   Procedure: ABDOMINAL [name];  Surgeon: Lorretta Harp, MD;  Location[facility] CATH LAB;  Service: Cardiovascular;;   ATHERECTOMY Right 07/29/2013   popliteal/notes 07/29/2013   CHOLECYSTECTOMY  1980's?   COLONOSCOPY WITH PROPOFOL N/A 09/06/2016   Procedure: COLONOSCOPY WITH PROPOFOL;  Surgeon: Garlan Fair, MD;  Location: [facility] ENDOSCOPY;  Service: Endoscopy;  Laterality: N/A;   cyst from right hand     ESOPHAGOGASTRODUODENOSCOPY (EGD) WITH PROPOFOL N/A 09/06/2016   Procedure: ESOPHAGOGASTRODUODENOSCOPY (EGD) WITH PROPOFOL;  Surgeon: Garlan Fair, MD;  Location: [facility] ENDOSCOPY;  Service: Endoscopy;  Laterality: N/A;   HIP FRACTURE SURGERY Left 2000's   "I broke the ball; dr broke femur during OR; got a rod in there" 92/07/2013)   LOWER EXTREMITY ANGIOGRAM N/A 06/04/2013   Procedure: LOWER EXTREMITY ANGIOGRAM;  Surgeon: Lorretta Harp, MD;  Location: [facility] CATH LAB;  Service: Cardiovascular;  Laterality: N/A;     Current Meds  Medication Sig   albuterol (PROVENTIL HFA;VENTOLIN HFA) 108 (90 BASE) MCG/ACT inhaler Inhale into the lungs every 6 (six) hours as needed for wheezing or shortness of breath.   aspirin EC 81 MG tablet Take 81 mg by mouth daily.   atorvastatin (LIPITOR) 20 MG tablet Take 20 mg by mouth every morning.    budesonide-formoterol (SYMBICORT) 160-4.5 MCG/ACT inhaler Inhale 2 puffs into the lungs 2 (two) times daily.   Calcium Carb-Cholecalciferol (CALCIUM 600/VITAMIN D3) 600-800 MG-UNIT TABS Take 1 tablet by mouth daily.    ferrous sulfate 325 (65 FE) MG tablet Take 325 mg by mouth every other day.   hydrochlorothiazide (HYDRODIURIL) 25 MG tablet Take 25 mg by mouth daily.   Insulin Glargine (LANTUS SOLOSTAR) 100 UNIT/ML Solostar Pen  Inject 8 Units into the skin daily at 2 PM.    levothyroxine (SYNTHROID, LEVOTHROID) 112 MCG tablet Take 112 mcg by mouth daily before breakfast.   NOVOLOG FLEXPEN 100 UNIT/ML FlexPen Inject 4 Units into the skin 3 (three) times daily. Inject 4 units in the morning, 3 units at lunch. (Sliding scale)   ONE TOUCH ULTRA TEST test strip TEST THREE TIMES DAILY E11.22   ramipril (ALTACE) 10 MG capsule Take 20 mg by mouth 2 (two) times daily.    sodium chloride (OCEAN) 0.65 % SOLN nasal spray Place 1 spray into both nostrils as needed for congestion.     Allergies:   Adhesive [tape]; Amoxicillin; and Latex   Social History   Tobacco Use   Smoking status: Current Every Day Smoker    Packs/day: 1.00    Years: 50.00    Pack years: 50.00    Types: Cigarettes   Smokeless tobacco: Never Used  Substance Use Topics   Alcohol use: No   Drug use: No     Family Hx: The patient's family history includes Heart disease in her father.  ROS:   Please see the history of present illness.     All other systems reviewed and are negative.  Prior CV studies:   The following studies were reviewed today:  Cardiac event monitor  Labs/Other Tests and Data Reviewed:    EKG:  No ECG reviewed.  Recent Labs: No results found for requested labs within last 8760 hours.   Recent Lipid Panel No results found for: CHOL, TRIG, HDL, CHOLHDL, LDLCALC, LDLDIRECT  Wt Readings from Last 3 Encounters:  10/16/18 108 lb 6.4 oz (49.2 kg)  11/25/16 113 lb 6.4 oz (51.4 kg)  09/06/16 112 lb (50.8 kg)     Objective:    Vital Signs:  BP (!) 149/73    Pulse 81    Ht 5\' 5"  (1.651 m)    Wt 108 lb 6.4 oz (49.2 kg)    BMI 18.04 kg/m    VITAL SIGNS:  reviewed GEN:  no acute distress RESPIRATORY:  normal respiratory effort, symmetric expansion NEURO:  alert and oriented x 3, no obvious focal deficit PSYCH:  normal affect  ASSESSMENT & PLAN:    1. Peripheral arterial disease- history of right popliteal  atherectomy and drug-coated balloon angioplasty/2/15 for high-grade calcified right popliteal artery stenosis with three-vessel runoff.  Her claudication subsequent improved and her Dopplers normalized.  Her most recent Dopplers performed 01/01/2018 revealed normal ABIs with a patent right popliteal artery. 2. Lipidemia- on statin therapy with lipid profile performed 09/17/2018 revealing total cholesterol 163 and LDL 88 and HDL 56 3. Essential hypertension- blood pressure measured by the patient today at home was 151/59 with a pulse of 59 she is on thiazide and ramipril. 4. Palpitations- recent history of palpitations with event monitor performed 09/20/2018 revealing episodes of PSVT, nonsustained ventricular tachycardia and PAF.  Her PCP did check labs including thyroid function tests revealing a markedly low TSH suggesting she was hyperthyroid.  Her thyroid placement therapy was down titrated and she has had no further symptoms.  COVID-19 Education: The signs and symptoms of COVID-19 were discussed with the patient and how to seek care for testing (follow up with PCP or arrange E-visit).  The importance of social distancing was discussed today.  Time:   Today, I have spent 11 minutes with the patient with telehealth technology discussing the above problems.     Medication Adjustments/Labs and Tests Ordered: Current medicines are reviewed at length with the patient today.  Concerns regarding medicines are outlined above.   Tests Ordered: No orders of the defined types were placed in this encounter.   Medication Changes: No orders of the defined types were placed in this encounter.   Disposition:  Follow up in 4 month(s)  Signed, Quay Burow, MD  10/16/2018 2:14 PM    [facility]

## 2018-10-16 NOTE — Patient Instructions (Signed)
Medication Instructions:  Your physician recommends that you continue on your current medications as directed. Please refer to the Current Medication list given to you today.  If you need a refill on your cardiac medications before your next appointment, please call your pharmacy.   Lab work: NONE If you have labs (blood work) drawn today and your tests are completely normal, you will receive your results only by: [name] MyChart Message (if you have MyChart) OR . A paper copy in the mail If you have any lab test that is abnormal or we need to change your treatment, we will call you to review the results.  Testing/Procedures: Your physician has requested that you have a lower or upper extremity arterial duplex. This test is an ultrasound of the arteries in the legs or arms. It looks at arterial blood flow in the legs and arms. Allow one hour for Lower and Upper Arterial scans. There are no restrictions or special instructions TO BE SCHEDULE IN July 2020  Your physician has requested that you have an ankle brachial index (ABI). During this test an ultrasound and blood pressure cuff are used to evaluate the arteries that supply the arms and legs with blood. Allow thirty minutes for this exam. There are no restrictions or special instructions. TO BE SCHEDULE IN July 2020  Your physician has recommended that you wear a 14 DAY ZIO-PATCH monitor. The Zio patch cardiac monitor continuously records heart rhythm data for up to 14 days, this is for patients being evaluated for multiple types heart rhythms. For the first 24 hours post application, please avoid getting the Zio monitor wet in the shower or by excessive sweating during exercise. After that, feel free to carry on with regular activities. Keep soaps and lotions away from the ZIO XT Patch.  This will be placed at our [facility] location - [address], [address].          Follow-Up: At [facility], you and your health needs are our  priority.  As part of our continuing mission to provide you with exceptional heart care, we have created designated Provider Care Teams.  These Care Teams include your primary Cardiologist (physician) and Advanced Practice Providers (APPs -  Physician Assistants and Nurse Practitioners) who all work together to provide you with the care you need, when you need it. You will need a follow up appointment in 4 months (August 2020) with Dr. Gwenlyn Found.  Please call our office 2 months in advance to schedule this appointment.

## 2018-10-16 NOTE — Addendum Note (Signed)
Addended by: Annita Brod on: 10/16/2018 03:12 PM   Modules accepted: Orders

## 2018-10-16 NOTE — Telephone Encounter (Signed)
Patient and/or DPR-approved person aware of AVS instructions and verbalized understanding. Letter including After Visit Summary and any other necessary documents mailed to the patient's address on file.  

## 2018-10-30 ENCOUNTER — Telehealth: Payer: Self-pay | Admitting: *Deleted

## 2018-10-30 NOTE — Telephone Encounter (Signed)
[employer] to mail a 14 day ZIO XT long term holter monitor to her home.  Instructions reviewed briefly as they are included in the monitor kit. 

## 2018-10-31 DIAGNOSIS — E039 Hypothyroidism, unspecified: Secondary | ICD-10-CM | POA: Diagnosis not present

## 2018-11-13 ENCOUNTER — Ambulatory Visit: Payer: Medicare Other | Admitting: Cardiovascular Disease

## 2018-11-27 ENCOUNTER — Other Ambulatory Visit: Payer: Self-pay

## 2018-11-27 ENCOUNTER — Encounter: Payer: Self-pay | Admitting: Podiatry

## 2018-11-27 ENCOUNTER — Ambulatory Visit (INDEPENDENT_AMBULATORY_CARE_PROVIDER_SITE_OTHER): Payer: Medicare Other | Admitting: Podiatry

## 2018-11-27 VITALS — Temp 97.3°F

## 2018-11-27 DIAGNOSIS — E1151 Type 2 diabetes mellitus with diabetic peripheral angiopathy without gangrene: Secondary | ICD-10-CM

## 2018-11-27 DIAGNOSIS — M79674 Pain in right toe(s): Secondary | ICD-10-CM

## 2018-11-27 DIAGNOSIS — M79675 Pain in left toe(s): Secondary | ICD-10-CM

## 2018-11-27 DIAGNOSIS — L84 Corns and callosities: Secondary | ICD-10-CM

## 2018-11-27 DIAGNOSIS — B351 Tinea unguium: Secondary | ICD-10-CM

## 2018-11-27 NOTE — Patient Instructions (Signed)
Diabetes Mellitus and Foot Care  Foot care is an important part of your health, especially when you have diabetes. Diabetes may cause you to have problems because of poor blood flow (circulation) to your feet and legs, which can cause your skin to:   Become thinner and drier.   Break more easily.   Heal more slowly.   Peel and crack.  You may also have nerve damage (neuropathy) in your legs and feet, causing decreased feeling in them. This means that you may not notice minor injuries to your feet that could lead to more serious problems. Noticing and addressing any potential problems early is the best way to prevent future foot problems.  How to care for your feet  Foot hygiene   Wash your feet daily with warm water and mild soap. Do not use hot water. Then, pat your feet and the areas between your toes until they are completely dry. Do not soak your feet as this can dry your skin.   Trim your toenails straight across. Do not dig under them or around the cuticle. File the edges of your nails with an emery board or nail file.   Apply a moisturizing lotion or petroleum jelly to the skin on your feet and to dry, brittle toenails. Use lotion that does not contain alcohol and is unscented. Do not apply lotion between your toes.  Shoes and socks   Wear clean socks or stockings every day. Make sure they are not too tight. Do not wear knee-high stockings since they may decrease blood flow to your legs.   Wear shoes that fit properly and have enough cushioning. Always look in your shoes before you put them on to be sure there are no objects inside.   To break in new shoes, wear them for just a few hours a day. This prevents injuries on your feet.  Wounds, scrapes, corns, and calluses   Check your feet daily for blisters, cuts, bruises, sores, and redness. If you cannot see the bottom of your feet, use a mirror or ask someone for help.   Do not cut corns or calluses or try to remove them with medicine.   If you  find a minor scrape, cut, or break in the skin on your feet, keep it and the skin around it clean and dry. You may clean these areas with mild soap and water. Do not clean the area with peroxide, alcohol, or iodine.   If you have a wound, scrape, corn, or callus on your foot, look at it several times a day to make sure it is healing and not infected. Check for:  ? Redness, swelling, or pain.  ? Fluid or blood.  ? Warmth.  ? Pus or a bad smell.  General instructions   Do not cross your legs. This may decrease blood flow to your feet.   Do not use heating pads or hot water bottles on your feet. They may burn your skin. If you have lost feeling in your feet or legs, you may not know this is happening until it is too late.   Protect your feet from hot and cold by wearing shoes, such as at the beach or on hot pavement.   Schedule a complete foot exam at least once a year (annually) or more often if you have foot problems. If you have foot problems, report any cuts, sores, or bruises to your health care provider immediately.  Contact a health care provider if:     You have a medical condition that increases your risk of infection and you have any cuts, sores, or bruises on your feet.   You have an injury that is not healing.   You have redness on your legs or feet.   You feel burning or tingling in your legs or feet.   You have pain or cramps in your legs and feet.   Your legs or feet are numb.   Your feet always feel cold.   You have pain around a toenail.  Get help right away if:   You have a wound, scrape, corn, or callus on your foot and:  ? You have pain, swelling, or redness that gets worse.  ? You have fluid or blood coming from the wound, scrape, corn, or callus.  ? Your wound, scrape, corn, or callus feels warm to the touch.  ? You have pus or a bad smell coming from the wound, scrape, corn, or callus.  ? You have a fever.  ? You have a red line going up your leg.  Summary   Check your feet every day  for cuts, sores, red spots, swelling, and blisters.   Moisturize feet and legs daily.   Wear shoes that fit properly and have enough cushioning.   If you have foot problems, report any cuts, sores, or bruises to your health care provider immediately.   Schedule a complete foot exam at least once a year (annually) or more often if you have foot problems.  This information is not intended to replace advice given to you by your health care provider. Make sure you discuss any questions you have with your health care provider.  Document Released: 06/10/2000 Document Revised: 07/26/2017 Document Reviewed: 07/15/2016  Elsevier Interactive Patient Education  2019 [employer]

## 2018-11-29 ENCOUNTER — Telehealth: Payer: Self-pay | Admitting: Radiology

## 2018-11-29 NOTE — Telephone Encounter (Signed)
Received a call from [facility] they had received patients monitor back with no data/not activated. I reached out to the patient and she did wear it for 14 days and stated she is positive she activated it. Unfortunately I will have to re-enroll patient for a new monitor as there is no data to be read. Patient is aware and agrees to wear a new monitor.

## 2018-12-01 NOTE — Progress Notes (Signed)
Subjective: Ariel Cobb is a 73 y.o. y.o. female who presents to clinic for preventative diabetic foot care on today. She is seen for painful, mycotic toenails, corns and calluses b/l.  Pain is aggravated when wearing enclosed shoe gear and relieved with periodic professional debridement.  She voices no new pedal concerns on today's visit.  Seward Carol, MD is her PCP. Last visit was 10/16/2018.   Current Outpatient Medications:  .  albuterol (PROVENTIL HFA;VENTOLIN HFA) 108 (90 BASE) MCG/ACT inhaler, Inhale into the lungs every 6 (six) hours as needed for wheezing or shortness of breath., Disp: , Rfl:  .  aspirin EC 81 MG tablet, Take 81 mg by mouth daily., Disp: , Rfl:  .  atorvastatin (LIPITOR) 20 MG tablet, Take 20 mg by mouth every morning. , Disp: , Rfl:  .  budesonide-formoterol (SYMBICORT) 160-4.5 MCG/ACT inhaler, Inhale 2 puffs into the lungs 2 (two) times daily., Disp: , Rfl:  .  Calcium Carb-Cholecalciferol (CALCIUM 600/VITAMIN D3) 600-800 MG-UNIT TABS, Take 1 tablet by mouth daily. , Disp: , Rfl:  .  ferrous sulfate 325 (65 FE) MG tablet, Take 325 mg by mouth every other day., Disp: , Rfl:  .  hydrochlorothiazide (HYDRODIURIL) 25 MG tablet, Take 25 mg by mouth daily., Disp: , Rfl: 2 .  Insulin Glargine (LANTUS SOLOSTAR) 100 UNIT/ML Solostar Pen, Inject 8 Units into the skin daily at 2 PM. , Disp: , Rfl:  .  levothyroxine (SYNTHROID) 100 MCG tablet, TAKE 1 TABLET BY MOUTH ON AN EMPTY STOMACH IN THE MORNING ONCE A DAY, Disp: , Rfl:  .  levothyroxine (SYNTHROID, LEVOTHROID) 112 MCG tablet, Take 112 mcg by mouth daily before breakfast., Disp: , Rfl:  .  NOVOLOG FLEXPEN 100 UNIT/ML FlexPen, Inject 4 Units into the skin 3 (three) times daily. Inject 4 units in the morning, 3 units at lunch. (Sliding scale), Disp: , Rfl: 11 .  ONE TOUCH ULTRA TEST test strip, TEST THREE TIMES DAILY E11.22, Disp: , Rfl: 4 .  ramipril (ALTACE) 10 MG capsule, Take 20 mg by mouth 2 (two) times daily. ,  Disp: , Rfl:  .  sodium chloride (OCEAN) 0.65 % SOLN nasal spray, Place 1 spray into both nostrils as needed for congestion., Disp: , Rfl:   Allergies  Allergen Reactions  . Adhesive [Tape] Itching    Surgical Tape - "makes me itch and break me out" -- HAS TO USE PAPER TAPE  . Amoxicillin Hives, Itching and Swelling  . Latex Itching    Objective: Vitals:   11/27/18 0932  Temp: (!) 97.3 F (36.3 C)    Vascular Examination: Capillary refill time <3 seconds x 10 digits.  Dorsalis pedis pulses 0/4 right foot, faintly palpable left foot.  Posterior tibial pulses absent b/l.  Digital hair absent x 10 digits.  Skin temperature gradient WNL b/l.  Dermatological Examination: Pedal skin thin, shiny and atrophic b/l.  Toenails 1-5 b/l discolored, thick, dystrophic with subungual debris and pain with palpation to nailbeds due to thickness of nails.  Hyperkeratotic lesions noted sub central heel pads b/l, submet head 1 right foot.  Also preulcerative HKT lesion noted dorsal right 5th PIPJ with tenderness to palpation. No erythema, no edema, no drainage, no flocculence noted.   Musculoskeletal: Muscle strength 5/5 to all LE muscle groups.  Hammertoe 5th digit right foot.  Neurological: Sensation diminished with 10 gram monofilament.  Vibratory sensation diminished b/l.  Assessment: 1.  Painful onychomycosis toenails 1-5 b/l 2.  Callus b/l heels, submet  head 1 right foot 3.  Preulcerative corn right 5th digit 4.  NIDDM with PAD  Plan: 1. Continue diabetic foot care principles. Literature dispensed on today. 2. Toenails 1-5 b/l were debrided in length and girth without iatrogenic bleeding. 3. Hyperkeratotic lesion(s) paredsub central heel pads b/l, submet head 1 right foot with sterile scalpel blade without incident. 4. Preulcerative corn pared right 5th digit. Antibiotic ointment and light dressing applied. Patient instructed to apply triple antibiotic ointment to digit once  daily for one week. Call office if she experiences any redness, swelling, drainage or increased pain. She related understanding. 5. Patient to continue soft, supportive shoe gear daily. 6. Patient to report any pedal injuries to medical professional immediately. 7. Follow up 3 months.  8. Patient/POA to call should there be a concern in the interim.

## 2018-12-03 ENCOUNTER — Ambulatory Visit (INDEPENDENT_AMBULATORY_CARE_PROVIDER_SITE_OTHER): Payer: Medicare Other

## 2018-12-03 DIAGNOSIS — R002 Palpitations: Secondary | ICD-10-CM

## 2018-12-11 DIAGNOSIS — E039 Hypothyroidism, unspecified: Secondary | ICD-10-CM | POA: Diagnosis not present

## 2018-12-19 DIAGNOSIS — E113299 Type 2 diabetes mellitus with mild nonproliferative diabetic retinopathy without macular edema, unspecified eye: Secondary | ICD-10-CM | POA: Diagnosis not present

## 2018-12-19 DIAGNOSIS — E1165 Type 2 diabetes mellitus with hyperglycemia: Secondary | ICD-10-CM | POA: Diagnosis not present

## 2018-12-19 DIAGNOSIS — Z794 Long term (current) use of insulin: Secondary | ICD-10-CM | POA: Diagnosis not present

## 2018-12-19 DIAGNOSIS — Z720 Tobacco use: Secondary | ICD-10-CM | POA: Diagnosis not present

## 2018-12-19 DIAGNOSIS — E139 Other specified diabetes mellitus without complications: Secondary | ICD-10-CM | POA: Diagnosis not present

## 2018-12-21 ENCOUNTER — Other Ambulatory Visit: Payer: Self-pay

## 2018-12-21 DIAGNOSIS — R002 Palpitations: Secondary | ICD-10-CM | POA: Diagnosis not present

## 2019-01-09 ENCOUNTER — Telehealth: Payer: Self-pay | Admitting: Cardiovascular Disease

## 2019-01-09 NOTE — Telephone Encounter (Signed)
I called pt to confirm her appt for 01-10-19 with Dr Gwenlyn Found.         COVID-19 Pre-Screening Questions:   In the past 7 to 10 days have you had a cough,  shortness of breath, headache, congestion, fever (100 or greater) body aches, chills, sore throat, or sudden loss of taste or sense of smell? no  Have you been around anyone with known Covid 19.  Have you been around anyone who is awaiting Covid 19 test results in the past 7 to 10 days? no  Have you been around anyone who has been exposed to Covid 19, or has mentioned symptoms of Covid 19 within the past 7 to 10 days? no  If you have any concerns/questions about symptoms patients report during screening (either on the phone or at threshold). Contact the provider seeing the patient or DOD for further guidance.  If neither are available contact a member of the leadership team.

## 2019-01-10 ENCOUNTER — Telehealth: Payer: Self-pay | Admitting: Cardiovascular Disease

## 2019-01-10 ENCOUNTER — Ambulatory Visit (INDEPENDENT_AMBULATORY_CARE_PROVIDER_SITE_OTHER): Payer: Medicare Other | Admitting: Cardiovascular Disease

## 2019-01-10 ENCOUNTER — Encounter: Payer: Self-pay | Admitting: Cardiovascular Disease

## 2019-01-10 ENCOUNTER — Other Ambulatory Visit: Payer: Self-pay

## 2019-01-10 DIAGNOSIS — I480 Paroxysmal atrial fibrillation: Secondary | ICD-10-CM | POA: Diagnosis not present

## 2019-01-10 DIAGNOSIS — I739 Peripheral vascular disease, unspecified: Secondary | ICD-10-CM | POA: Diagnosis not present

## 2019-01-10 DIAGNOSIS — E782 Mixed hyperlipidemia: Secondary | ICD-10-CM

## 2019-01-10 DIAGNOSIS — I10 Essential (primary) hypertension: Secondary | ICD-10-CM | POA: Diagnosis not present

## 2019-01-10 MED ORDER — METOPROLOL SUCCINATE ER 25 MG PO TB24: 25.0000 mg | ORAL_TABLET | Freq: Every day | ORAL | 3 refills | Status: DC

## 2019-01-10 NOTE — Progress Notes (Signed)
01/10/2019 [name]   Mar 09, 1946  [id number]  Primary Physician Seward Carol, MD Primary Cardiologist: Lorretta Harp MD [facility], [place], [place]  HPI:  Ariel Cobb is a 73 y.o.thin appearing widowed Caucasian female no children and who lives alone. I last saw her in the office  10/16/2018. She is accompanied by one of her sisters today.She was referred by Dr. Ila Mcgill at [facility] for evaluation and treatment of claudication. Her primary care physician is Dr. Alta Corning at [facility]. Her cardiovascular risk factors include a 50-pack-year history tobacco abuse currently smoking one pack per day, 2 hypertension, diabetes and hyperlipidemia. Her father died suddenly of microinfarction at age 37. She has never had a heart attack or stroke patient is compared to sleep denies chest pain. She complains of right calf claudication with recent arterial Dopplers performed in our office on 04/25/13 revealing a right ABI 0.93 with a high-frequency signal in the right popliteal artery. She underwent angiography on 12/9 revealing a high-grade right popliteal artery stenosis with three-vessel runoff. Because of moderate renal deficiency her intervention was staged until today.I performed angiography and intervention on her 2 /2/15. She had a 99% calcified lesion in the right popliteal artery but I performed diamondback orbital rotational atherectomy on. Her symptoms of claudication resolved after her procedure and her Dopplers normalized. Her most recent Dopplers performed 02/05/14 revealed ABIs of 1 bilaterally with a widely patent right popliteal artery.  Since I saw her in the office almost 2 years ago she is done well.  She still denies claudication.  Her recent lower extremity arterial Doppler studies performed in July of last year revealed normal ABIs with moderate increased velocities in the distal right SFA.  She was complaining of some tachypalpitations and had a monitor  performed in our office 09/20/2018 that showed episodes of PSVT, nonsustained ventricular tachycardia and PAF.  She did have blood work performed by her PCP that showed a TSH 2.03 suggesting that she was hyperthyroid.  Her thyroid replacement therapy was down titrated.  Since that time she no longer is experiencing palpitations.  She is in A. fib today with a ventricular response of 78.  She did complain of some jaw pain as well as chest discomfort initially which she no longer has.  She denies claudication.   Current Meds  Medication Sig  . albuterol (PROVENTIL HFA;VENTOLIN HFA) 108 (90 BASE) MCG/ACT inhaler Inhale into the lungs every 6 (six) hours as needed for wheezing or shortness of breath.  [name] aspirin EC 81 MG tablet Take 81 mg by mouth daily.  [name] atorvastatin (LIPITOR) 20 MG tablet Take 20 mg by mouth every morning.   . budesonide-formoterol (SYMBICORT) 160-4.5 MCG/ACT inhaler Inhale 2 puffs into the lungs 2 (two) times daily.  . Calcium Carb-Cholecalciferol (CALCIUM 600/VITAMIN D3) 600-800 MG-UNIT TABS Take 1 tablet by mouth daily.   . ferrous sulfate 325 (65 FE) MG tablet Take 325 mg by mouth every other day.  . hydrochlorothiazide (HYDRODIURIL) 25 MG tablet Take 25 mg by mouth daily.  . Insulin Glargine (LANTUS SOLOSTAR) 100 UNIT/ML Solostar Pen Inject 8 Units into the skin daily at 2 PM.   . levothyroxine (SYNTHROID) 75 MCG tablet   . levothyroxine (SYNTHROID, LEVOTHROID) 112 MCG tablet Take 112 mcg by mouth daily before breakfast.  . NOVOLOG FLEXPEN 100 UNIT/ML FlexPen Inject 4 Units into the skin 3 (three) times daily. Inject 4 units in the morning, 3 units at lunch. (Sliding scale)  .  ONE TOUCH ULTRA TEST test strip TEST THREE TIMES DAILY E11.22  . ramipril (ALTACE) 10 MG capsule Take 20 mg by mouth 2 (two) times daily.   . sodium chloride (OCEAN) 0.65 % SOLN nasal spray Place 1 spray into both nostrils as needed for congestion.     Allergies  Allergen Reactions  . Adhesive  [Tape] Itching    Surgical Tape - "makes me itch and break me out" -- HAS TO USE PAPER TAPE  . Amoxicillin Hives, Itching and Swelling  . Latex Itching    Social History   Socioeconomic History  . Marital status: Widowed    Spouse name: Not on file  . Number of children: Not on file  . Years of education: Not on file  . Highest education level: Not on file  Occupational History  . Not on file  Social Needs  . Financial resource strain: Not on file  . Food insecurity    Worry: Not on file    Inability: Not on file  . Transportation needs    Medical: Not on file    Non-medical: Not on file  Tobacco Use  . Smoking status: Current Every Day Smoker    Packs/day: 1.00    Years: 50.00    Pack years: 50.00    Types: Cigarettes  . Smokeless tobacco: Never Used  Substance and Sexual Activity  . Alcohol use: No  . Drug use: No  . Sexual activity: Never  Lifestyle  . Physical activity    Days per week: Not on file    Minutes per session: Not on file  . Stress: Not on file  Relationships  . Social Herbalist on phone: Not on file    Gets together: Not on file    Attends religious service: Not on file    Active member of club or organization: Not on file    Attends meetings of clubs or organizations: Not on file    Relationship status: Not on file  . Intimate partner violence    Fear of current or ex partner: Not on file    Emotionally abused: Not on file    Physically abused: Not on file    Forced sexual activity: Not on file  Other Topics Concern  . Not on file  Social History Narrative  . Not on file     Review of Systems: General: negative for chills, fever, night sweats or weight changes.  Cardiovascular: negative for chest pain, dyspnea on exertion, edema, orthopnea, palpitations, paroxysmal nocturnal dyspnea or shortness of breath Dermatological: negative for rash Respiratory: negative for cough or wheezing Urologic: negative for hematuria  Abdominal: negative for nausea, vomiting, diarrhea, bright red blood per rectum, melena, or hematemesis Neurologic: negative for visual changes, syncope, or dizziness All other systems reviewed and are otherwise negative except as noted above.    Blood pressure (!) 162/75, pulse 100, temperature 98.1 F (36.7 C), height 5\' 5"  (1.651 m), weight 110 lb (49.9 kg), SpO2 100 %.  General appearance: alert and no distress Neck: no adenopathy, no carotid bruit, no JVD, supple, symmetrical, trachea midline and thyroid not enlarged, symmetric, no tenderness/mass/nodules Lungs: clear to auscultation bilaterally Heart: irregularly irregular rhythm Extremities: extremities normal, atraumatic, no cyanosis or edema Pulses: 2+ and symmetric Skin: Skin color, texture, turgor normal. No rashes or lesions Neurologic: Alert and oriented X 3, normal strength and tone. Normal symmetric reflexes. Normal coordination and gait  EKG atrial fibrillation with a ventricular spots of  78.  I personally reviewed this EKG.  ASSESSMENT AND PLAN:   PAD (peripheral artery disease) History of PAD status post diamondback orbital rotational atherectomy, PTA of a high-grade calcified distal right SFA stenosis by myself in 2015.  Her last Doppler studies performed 01/01/2018 revealed a right ABI 0.92 with moderate disease in the distal right SFA although she really denies claudication.  We will repeat lower extremity arterial Doppler studies.  Essential hypertension History of essential hypertension with blood pressure measured today at 162/75.  She is on hydrochlorothiazide and ramipril.  Hyperlipidemia History of hyperlipidemia on statin therapy with lipid profile performed 09/17/2018 revealing total cholesterol 163, LDL of 88 and HDL 56  Paroxysmal atrial fibrillation (HCC) History of PAF found on event monitoring along with PSVT and nonsustained ventricular tachycardia.  She was symptomatic initially but was also found to  be hyperthyroid probably from over replacement.  This has since been down titrated and her symptoms have significantly improved.  She is in A. fib today with a ventricular spots of 78.The CHA2DSVASC2 score is  6 .  I am going to obtain a 2D echo, begin low-dose beta-blocker for rate control and Eliquis oral anticoagulation for stroke prophylaxis.  In addition, she was complaining some jaw and pain and chest discomfort initially I will get a pharmacologic Myoview stress test to further evaluate      Lorretta Harp MD [facility], [facility] 01/10/2019 12:00 PM

## 2019-01-10 NOTE — Assessment & Plan Note (Signed)
History of PAF found on event monitoring along with PSVT and nonsustained ventricular tachycardia.  She was symptomatic initially but was also found to be hyperthyroid probably from over replacement.  This has since been down titrated and her symptoms have significantly improved.  She is in A. fib today with a ventricular spots of 78.The CHA2DSVASC2 score is  6 .  I am going to obtain a 2D echo, begin low-dose beta-blocker for rate control and Eliquis oral anticoagulation for stroke prophylaxis.  In addition, she was complaining some jaw and pain and chest discomfort initially I will get a pharmacologic Myoview stress test to further evaluate

## 2019-01-10 NOTE — Telephone Encounter (Signed)
LVM for patient to pick up instructions from the front desk for her [place] on 01-24-19.

## 2019-01-10 NOTE — Assessment & Plan Note (Signed)
History of essential hypertension with blood pressure measured today at 162/75.  She is on hydrochlorothiazide and ramipril.

## 2019-01-10 NOTE — Assessment & Plan Note (Signed)
History of PAD status post diamondback orbital rotational atherectomy, PTA of a high-grade calcified distal right SFA stenosis by myself in 2015.  Her last Doppler studies performed 01/01/2018 revealed a right ABI 0.92 with moderate disease in the distal right SFA although she really denies claudication.  We will repeat lower extremity arterial Doppler studies.

## 2019-01-10 NOTE — Patient Instructions (Addendum)
Medication Instructions:  Your physician has recommended you make the following change in your medication:   START METOPROLOL SUCCINATE (TOPROL XL) 25 MG BY MOUTH DAILY  If you need a refill on your cardiac medications before your next appointment, please call your pharmacy.   Lab work: Your physician recommends that you return for lab work TODAY: [place]  If you have labs (blood work) drawn today and your tests are completely normal, you will receive your results only by: [name] MyChart Message (if you have MyChart) OR . A paper copy in the mail If you have any lab test that is abnormal or we need to change your treatment, we will call you to review the results.  Testing/Procedures: Your physician has requested that you have a lower extremity arterial duplex. This test is an ultrasound of the arteries in the legs. It looks at arterial blood flow in the legs and arms. Allow one hour for Lower and Upper Arterial scans. There are no restrictions or special instructions  Your physician has requested that you have an ankle brachial index (ABI). During this test an ultrasound and blood pressure cuff are used to evaluate the arteries that supply the arms and legs with blood. Allow thirty minutes for this exam. There are no restrictions or special instructions.  Your physician has requested that you have an echocardiogram. Echocardiography is a painless test that uses sound waves to create images of your heart. It provides your doctor with information about the size and shape of your heart and how well your heart's chambers and valves are working. This procedure takes approximately one hour. There are no restrictions for this procedure.    Your physician has requested that you have a lexiscan myoview. For further information please visit [email or web address]. Please follow instruction sheet, as given.  Cardiac Nuclear Scan A cardiac nuclear scan is a test that is done to check the flow of  blood to your heart. It is done when you are resting and when you are exercising. The test looks for problems such as:  Not enough blood reaching a portion of the heart.  The heart muscle not working as it should. You may need this test if:  You have heart disease.  You have had lab results that are not normal.  You have had heart surgery or a balloon procedure to open up blocked arteries (angioplasty).  You have chest pain.  You have shortness of breath. In this test, a special dye (tracer) is put into your bloodstream. The tracer will travel to your heart. A camera will then take pictures of your heart to see how the tracer moves through your heart. This test is usually done at a hospital and takes 2-4 hours. Tell a doctor about:  Any allergies you have.  All medicines you are taking, including vitamins, herbs, eye drops, creams, and over-the-counter medicines.  Any problems you or family members have had with anesthetic medicines.  Any blood disorders you have.  Any surgeries you have had.  Any medical conditions you have.  Whether you are pregnant or may be pregnant. What are the risks? Generally, this is a safe test. However, problems may occur, such as:  Serious chest pain and heart attack. This is only a risk if the stress portion of the test is done.  Rapid heartbeat.  A feeling of warmth in your chest. This feeling usually does not last long.  Allergic reaction to the tracer. What happens before the test?  Ask your doctor about changing or stopping your normal medicines. This is important.  Follow instructions from your doctor about what you cannot eat or drink.  Remove your jewelry on the day of the test. What happens during the test?  An IV tube will be inserted into one of your veins.  Your doctor will give you a small amount of tracer through the IV tube.  You will wait for 20-40 minutes while the tracer moves through your bloodstream.  Your heart  will be monitored with an electrocardiogram (ECG).  You will lie down on an exam table.  Pictures of your heart will be taken for about 15-20 minutes.  You may also have a stress test. For this test, one of these things may be done: ? You will be asked to exercise on a treadmill or a stationary bike. ? You will be given medicines that will make your heart work harder. This is done if you are unable to exercise.  When blood flow to your heart has peaked, a tracer will again be given through the IV tube.  After 20-40 minutes, you will get back on the exam table. More pictures will be taken of your heart.  Depending on the tracer that is used, more pictures may need to be taken 3-4 hours later.  Your IV tube will be removed when the test is over. The test may vary among doctors and hospitals. What happens after the test?  Ask your doctor: ? Whether you can return to your normal schedule, including diet, activities, and medicines. ? Whether you should drink more fluids. This will help to remove the tracer from your body. Drink enough fluid to keep your pee (urine) pale yellow.  Ask your doctor, or the department that is doing the test: ? When will my results be ready? ? How will I get my results? Summary  A cardiac nuclear scan is a test that is done to check the flow of blood to your heart.  Tell your doctor whether you are pregnant or may be pregnant.  Before the test, ask your doctor about changing or stopping your normal medicines. This is important.  Ask your doctor whether you can return to your normal activities. You may be asked to drink more fluids. This information is not intended to replace advice given to you by your health care provider. Make sure you discuss any questions you have with your health care provider. Document Released: 11/27/2017 Document Revised: 10/03/2018 Document Reviewed: 11/27/2017 Elsevier Patient Education  [place]: At  [facility], you and your health needs are our priority.  As part of our continuing mission to provide you with exceptional heart care, we have created designated Provider Care Teams.  These Care Teams include your primary Cardiologist (physician) and Advanced Practice Providers (APPs -  Physician Assistants and Nurse Practitioners) who all work together to provide you with the care you need, when you need it. You will need a follow up appointment in 3 months WITH DR. Gwenlyn Found.  Please call our office 2 months in advance to schedule this appointment.     PLEASE COMPLETE YOUR ELIQUIS PATIENT ASSISTANCE PAPERWORK AND RETURN TO THE OFFICE (HEARTCARE AT [facility]).

## 2019-01-10 NOTE — Assessment & Plan Note (Signed)
History of hyperlipidemia on statin therapy with lipid profile performed 09/17/2018 revealing total cholesterol 163, LDL of 88 and HDL 56

## 2019-01-11 ENCOUNTER — Other Ambulatory Visit: Payer: Self-pay | Admitting: Pharmacist

## 2019-01-11 DIAGNOSIS — I10 Essential (primary) hypertension: Secondary | ICD-10-CM | POA: Diagnosis not present

## 2019-01-11 LAB — BASIC METABOLIC PANEL
BUN/Creatinine Ratio: 28 (ref 12–28)
BUN: 47 mg/dL — ABNORMAL HIGH (ref 8–27)
CO2: 26 mmol/L (ref 20–29)
Calcium: 9.9 mg/dL (ref 8.7–10.3)
Chloride: 95 mmol/L — ABNORMAL LOW (ref 96–106)
Creatinine, Ser: 1.7 mg/dL — ABNORMAL HIGH (ref 0.57–1.00)
GFR calc Af Amer: 34 mL/min/{1.73_m2} — ABNORMAL LOW (ref >59)
GFR calc non Af Amer: 29 mL/min/{1.73_m2} — ABNORMAL LOW (ref >59)
Glucose: 173 mg/dL — ABNORMAL HIGH (ref 65–99)
Potassium: 4.1 mmol/L (ref 3.5–5.2)
Sodium: 137 mmol/L (ref 134–144)

## 2019-01-11 MED ORDER — APIXABAN 5 MG PO TABS: 5.0000 mg | ORAL_TABLET | Freq: Two times a day (BID) | ORAL | 1 refills | Status: DC

## 2019-01-17 ENCOUNTER — Other Ambulatory Visit: Payer: Self-pay | Admitting: Cardiovascular Disease

## 2019-01-17 ENCOUNTER — Other Ambulatory Visit: Payer: Self-pay

## 2019-01-17 ENCOUNTER — Ambulatory Visit (HOSPITAL_COMMUNITY)
Admission: RE | Admit: 2019-01-17 | Discharge: 2019-01-17 | Disposition: A | Discharge status: Home / Self Care | Payer: Medicare Other | Source: Ambulatory Visit | Attending: Cardiology | Admitting: Cardiology

## 2019-01-17 DIAGNOSIS — I739 Peripheral vascular disease, unspecified: Secondary | ICD-10-CM | POA: Insufficient documentation

## 2019-01-22 ENCOUNTER — Telehealth (HOSPITAL_COMMUNITY): Payer: Self-pay | Admitting: *Deleted

## 2019-01-22 DIAGNOSIS — E039 Hypothyroidism, unspecified: Secondary | ICD-10-CM | POA: Diagnosis not present

## 2019-01-22 NOTE — Telephone Encounter (Signed)
Close encounter 

## 2019-01-23 ENCOUNTER — Ambulatory Visit (HOSPITAL_COMMUNITY): Payer: Medicare Other | Attending: Internal Medicine

## 2019-01-23 ENCOUNTER — Other Ambulatory Visit: Payer: Self-pay

## 2019-01-23 DIAGNOSIS — I480 Paroxysmal atrial fibrillation: Secondary | ICD-10-CM | POA: Diagnosis not present

## 2019-01-24 ENCOUNTER — Ambulatory Visit (HOSPITAL_COMMUNITY)
Admission: RE | Admit: 2019-01-24 | Discharge: 2019-01-24 | Disposition: A | Discharge status: Home / Self Care | Payer: Medicare Other | Source: Ambulatory Visit | Attending: Internal Medicine | Admitting: Internal Medicine

## 2019-01-24 DIAGNOSIS — I480 Paroxysmal atrial fibrillation: Secondary | ICD-10-CM | POA: Diagnosis not present

## 2019-01-24 LAB — MYOCARDIAL PERFUSION IMAGING
LV dias vol: 32 mL (ref 46–106)
LV sys vol: 84 mL
Peak HR: 75 {beats}/min
Rest HR: 43 {beats}/min
SDS: 1
SRS: 0
SSS: 1
TID: 1.15

## 2019-01-24 MED ORDER — AMINOPHYLLINE 25 MG/ML IV SOLN
75.0000 mg | Freq: Once | INTRAVENOUS | Status: AC
  Administered 2019-01-24: 75 mg via INTRAVENOUS

## 2019-01-24 MED ORDER — REGADENOSON 0.4 MG/5ML IV SOLN
0.4000 mg | Freq: Once | INTRAVENOUS | Status: AC
  Administered 2019-01-24: 0.4 mg via INTRAVENOUS

## 2019-01-24 MED ORDER — TECHNETIUM TC 99M TETROFOSMIN IV KIT
28.2000 | PACK | Freq: Once | INTRAVENOUS | Status: AC | PRN
  Administered 2019-01-24: 28.2 via INTRAVENOUS
  Filled 2019-01-24: qty 29

## 2019-01-24 MED ORDER — TECHNETIUM TC 99M TETROFOSMIN IV KIT
10.6000 | PACK | Freq: Once | INTRAVENOUS | Status: AC | PRN
  Administered 2019-01-24: 10.6 via INTRAVENOUS
  Filled 2019-01-24: qty 11

## 2019-01-25 ENCOUNTER — Telehealth: Payer: Self-pay

## 2019-01-25 NOTE — Telephone Encounter (Signed)
Pt updated with her lab results and voiced understanding. Pt also report that she feels like her HR is slower and a little more tired than normal. She report she woke up earlier than usual yesterday morning so symptoms could be related to that but did have enough energy to go to the grocery store today. Pt denies feeling light head, dizzy, or like she about to pass out and state she doesn't have a way to check HR.  Pt has an appointment with Kerin Ransom, PA Monday 8/3. Pt advised to continue to monitor symptoms over the weekend and contact after hours or go to ER if she develops any other symptoms. Pt verbalized understanding.

## 2019-01-28 ENCOUNTER — Ambulatory Visit (INDEPENDENT_AMBULATORY_CARE_PROVIDER_SITE_OTHER): Payer: Medicare Other | Admitting: Cardiology

## 2019-01-28 ENCOUNTER — Encounter: Payer: Self-pay | Admitting: Cardiology

## 2019-01-28 ENCOUNTER — Other Ambulatory Visit: Payer: Self-pay

## 2019-01-28 ENCOUNTER — Telehealth: Payer: Self-pay

## 2019-01-28 VITALS — BP 146/50 | HR 61 | Temp 98.1°F | Ht 65.0 in | Wt 111.0 lb

## 2019-01-28 DIAGNOSIS — E119 Type 2 diabetes mellitus without complications: Secondary | ICD-10-CM | POA: Diagnosis not present

## 2019-01-28 DIAGNOSIS — IMO0001 Reserved for inherently not codable concepts without codable children: Secondary | ICD-10-CM

## 2019-01-28 DIAGNOSIS — Z794 Long term (current) use of insulin: Secondary | ICD-10-CM

## 2019-01-28 DIAGNOSIS — J439 Emphysema, unspecified: Secondary | ICD-10-CM | POA: Diagnosis not present

## 2019-01-28 DIAGNOSIS — I10 Essential (primary) hypertension: Secondary | ICD-10-CM | POA: Diagnosis not present

## 2019-01-28 DIAGNOSIS — E782 Mixed hyperlipidemia: Secondary | ICD-10-CM

## 2019-01-28 DIAGNOSIS — J449 Chronic obstructive pulmonary disease, unspecified: Secondary | ICD-10-CM | POA: Insufficient documentation

## 2019-01-28 DIAGNOSIS — Z7901 Long term (current) use of anticoagulants: Secondary | ICD-10-CM

## 2019-01-28 DIAGNOSIS — N183 Chronic kidney disease, stage 3 unspecified: Secondary | ICD-10-CM

## 2019-01-28 DIAGNOSIS — I739 Peripheral vascular disease, unspecified: Secondary | ICD-10-CM

## 2019-01-28 DIAGNOSIS — I480 Paroxysmal atrial fibrillation: Secondary | ICD-10-CM

## 2019-01-28 MED ORDER — APIXABAN 2.5 MG PO TABS: 2.5000 mg | ORAL_TABLET | Freq: Two times a day (BID) | ORAL | 1 refills | Status: DC

## 2019-01-28 NOTE — Patient Instructions (Addendum)
Medication Instructions:  DECREASE Eliquis to 2.5mg  take 1 tablet twice a day, 1 week sample supply given  If you need a refill on your cardiac medications before your next appointment, please call your pharmacy.   Lab work: None  If you have labs (blood work) drawn today and your tests are completely normal, you will receive your results only by: [name] MyChart Message (if you have MyChart) OR . A paper copy in the mail If you have any lab test that is abnormal or we need to change your treatment, we will call you to review the results.  Testing/Procedures: None   Follow-Up: At [facility], you and your health needs are our priority.  As part of our continuing mission to provide you with exceptional heart care, we have created designated Provider Care Teams.  These Care Teams include your primary Cardiologist (physician) and Advanced Practice Providers (APPs -  Physician Assistants and Nurse Practitioners) who all work together to provide you with the care you need, when you need it. [name] [place] RECOMMENDS YOU FOLLOW UP WITH DR Gwenlyn Found AS SCHEDULED  Any Other Special Instructions Will Be Listed Below (If Applicable). I will check the status of your patient assistance forms

## 2019-01-28 NOTE — Assessment & Plan Note (Addendum)
Eliquis added 01/07/2019- will reduce dose to 2.5 mg BID based on weight less than 60 kg and SCr greater than 1.5

## 2019-01-28 NOTE — Assessment & Plan Note (Signed)
Rt popliteal PTA Feb 2015- dopplers stable July 2020

## 2019-01-28 NOTE — Assessment & Plan Note (Signed)
Pt noted to be in AF 01/07/2019- possibly secondary to iatrogenic hyperthyroidism.  She converted spontaneously to NSR after adjustment in her Synthroid dose.

## 2019-01-28 NOTE — Assessment & Plan Note (Signed)
COPD on CXR 2017- 1 PPD smoker

## 2019-01-28 NOTE — Telephone Encounter (Signed)
Patient called back to let me know she got my message. I informed her that we have her paperwork and once Dr Gwenlyn Found signs his part then the forms will be faxed. I also advised her that she should hear back from Dr Kennon Holter nurse soon. Patient voiced understanding and thanked me for all my help.

## 2019-01-28 NOTE — Telephone Encounter (Signed)
Patient was seen in the office today for an appt with Kerin Ransom. Patient wanted to know if we received a decision for patient assistance for Eliquis. Patient stated she completed her portion of the paper work and returned it back to the office. I told patient I would look into and give her a call back.  I called patient assistance and was told they have not received the form yet. The woman gave me a case ID number: [id number] and I told me to make sure we put this on the fax.   Reached out to Dr Kennon Holter nurse to check the status of patient assistance paperwork. She stated she received patients paperwork and was waiting for Dr Gwenlyn Found to sign his part and then she was going to give it to the PharmD. I advised her to have Dr Gwenlyn Found sign the form and fax it back because the patient is running low on this medication. I also advised her that Lurena Joiner decreased the medication from 5mg  to 2.5mg  and she needed to update the form and to make sure she added the case ID to help things move along faster as well as for her to please give the patient a call and let her know where we are in the process. Dr Kennon Holter nurse voiced understanding.

## 2019-01-28 NOTE — Assessment & Plan Note (Addendum)
GFR 34- SCr 1.7

## 2019-01-28 NOTE — Assessment & Plan Note (Signed)
IDDM with CRI 

## 2019-01-28 NOTE — Progress Notes (Signed)
Cardiology Office Note:    Date:  01/28/2019   ID:  Ariel Cobb, DOB 10/18/1945, MRN [id number]  PCP:  Seward Carol, MD  Cardiologist:  Quay Burow, MD  Electrophysiologist:  None   Referring MD: Seward Carol, MD   Chief Complaint  Patient presents with  . Follow-up  . Bradycardia    History of Present Illness:    Ariel Cobb is a 73 y.o. female with a hx of insulin-dependent diabetes, peripheral vascular disease status post right popliteal PTA in 2015, COPD, 1 pack a day smoker currently, essential hypertension, and chronic renal insufficiency stage III.  She had a monitor placed in March 2020 that showed PSVT and PAF.  Dr. Gwenlyn Found saw her in the office January 07, 2019 and she was in atrial fibrillation.  She is relatively asymptomatic although she did have some jaw pain prior to that visit in the setting of increased heart rate at night.  Turns out she was mildly iatrogenically hyperthyroid and her Synthroid dose was adjusted.  Her symptoms improved with this.  Dr. Gwenlyn Found ordered an echo, Myoview, and LE a Dopplers.  She is in the office today for follow-up.  Echocardiogram showed normal LV function with mild LVH and normal left atrial size.  Myoview study was negative for ischemia.  Lower extremity arterial Doppler showed no change from last year study.  Symptomatically she is doing well, she denies any palpitations and has not had recurrent jaw pain.  She is concerned about being able to afford the Eliquis.   Past Medical History:  Diagnosis Date  . Anemia    slight told feb dr polite  . Arthritis   . Asthma   . Chronic bronchitis ([place])   . Chronic kidney disease    sees dr sanford yearly stage 3 stable  . Claudication ([place])   . Complication of anesthesia 2000   slow to awaken after hip surgery  . Hyperlipidemia   . Hypertension   . Hypothyroidism   . Osteoporosis   . PAD (peripheral artery disease) ([place])    a. 07/29/13: s/p PV angiogram with successful  diamondback orbital rotational atherectomy of the high-grade calcified R popliteal artery stenosis  . Shortness of breath    on exertion  . Thyroid disease   . Type II diabetes mellitus ([place])     Past Surgical History:  Procedure Laterality Date  . ABDOMINAL AORTAGRAM  06/04/2013   Procedure: ABDOMINAL [name];  Surgeon: Lorretta Harp, MD;  Location: [facility] CATH LAB;  Service: Cardiovascular;;  . ATHERECTOMY Right 07/29/2013   popliteal/notes 07/29/2013  . CHOLECYSTECTOMY  1980's?  . COLONOSCOPY WITH PROPOFOL N/A 09/06/2016   Procedure: COLONOSCOPY WITH PROPOFOL;  Surgeon: Garlan Fair, MD;  Location: [facility] ENDOSCOPY;  Service: Endoscopy;  Laterality: N/A;  . cyst from right hand    . ESOPHAGOGASTRODUODENOSCOPY (EGD) WITH PROPOFOL N/A 09/06/2016   Procedure: ESOPHAGOGASTRODUODENOSCOPY (EGD) WITH PROPOFOL;  Surgeon: Garlan Fair, MD;  Location: [facility] ENDOSCOPY;  Service: Endoscopy;  Laterality: N/A;  . HIP FRACTURE SURGERY Left 2000's   "I broke the ball; dr broke femur during OR; got a rod in there" 92/07/2013)  . LOWER EXTREMITY ANGIOGRAM N/A 06/04/2013   Procedure: LOWER EXTREMITY ANGIOGRAM;  Surgeon: Lorretta Harp, MD;  Location: [facility] CATH LAB;  Service: Cardiovascular;  Laterality: N/A;    Current Medications: Current Meds  Medication Sig  . albuterol (PROVENTIL HFA;VENTOLIN HFA) 108 (90 BASE) MCG/ACT inhaler Inhale into the lungs every 6 (six) hours as  needed for wheezing or shortness of breath.  [name] apixaban (ELIQUIS) 2.5 MG TABS tablet Take 1 tablet (2.5 mg total) by mouth 2 (two) times daily.  [name] aspirin EC 81 MG tablet Take 81 mg by mouth daily.  [name] atorvastatin (LIPITOR) 20 MG tablet Take 20 mg by mouth every morning.   . budesonide-formoterol (SYMBICORT) 160-4.5 MCG/ACT inhaler Inhale 2 puffs into the lungs 2 (two) times daily.  . Calcium Carb-Cholecalciferol (CALCIUM 600/VITAMIN D3) 600-800 MG-UNIT TABS Take 1 tablet by mouth daily.   . ferrous sulfate 325 (65 FE) MG tablet Take  325 mg by mouth every other day.  . hydrochlorothiazide (HYDRODIURIL) 25 MG tablet Take 25 mg by mouth daily.  . Insulin Glargine (LANTUS SOLOSTAR) 100 UNIT/ML Solostar Pen Inject 8 Units into the skin daily at 2 PM.   . levothyroxine (SYNTHROID) 75 MCG tablet   . metoprolol succinate (TOPROL XL) 25 MG 24 hr tablet Take 1 tablet (25 mg total) by mouth daily.  [name] NOVOLOG FLEXPEN 100 UNIT/ML FlexPen Inject 4 Units into the skin 3 (three) times daily. Inject 4 units in the morning, 3 units at lunch. (Sliding scale)  . ONE TOUCH ULTRA TEST test strip TEST THREE TIMES DAILY E11.22  . ramipril (ALTACE) 10 MG capsule Take 20 mg by mouth 2 (two) times daily.   . sodium chloride (OCEAN) 0.65 % SOLN nasal spray Place 1 spray into both nostrils as needed for congestion.  . [DISCONTINUED] apixaban (ELIQUIS) 5 MG TABS tablet Take 1 tablet (5 mg total) by mouth 2 (two) times daily.     Allergies:   Adhesive [tape], Amoxicillin, and Latex   Social History   Socioeconomic History  . Marital status: Widowed    Spouse name: Not on file  . Number of children: Not on file  . Years of education: Not on file  . Highest education level: Not on file  Occupational History  . Not on file  Social Needs  . Financial resource strain: Not on file  . Food insecurity    Worry: Not on file    Inability: Not on file  . Transportation needs    Medical: Not on file    Non-medical: Not on file  Tobacco Use  . Smoking status: Current Every Day Smoker    Packs/day: 1.00    Years: 50.00    Pack years: 50.00    Types: Cigarettes  . Smokeless tobacco: Never Used  Substance and Sexual Activity  . Alcohol use: No  . Drug use: No  . Sexual activity: Never  Lifestyle  . Physical activity    Days per week: Not on file    Minutes per session: Not on file  . Stress: Not on file  Relationships  . Social Herbalist on phone: Not on file    Gets together: Not on file    Attends religious service: Not on  file    Active member of club or organization: Not on file    Attends meetings of clubs or organizations: Not on file    Relationship status: Not on file  Other Topics Concern  . Not on file  Social History Narrative  . Not on file     Family History: The patient's family history includes Heart disease in her father.  ROS:   Please see the history of present illness.     All other systems reviewed and are negative.  EKGs/Labs/Other Studies Reviewed:    The following studies  were reviewed today: Myoview July 2020 Echo July 2020 LEA dopplers July 2020  EKG:  EKG is ordered today.  The ekg ordered today demonstrates NSR, incomplete RBBB, HR 61  Recent Labs: 01/11/2019: BUN 47; Creatinine, Ser 1.70; Potassium 4.1; Sodium 137  Recent Lipid Panel No results found for: CHOL, TRIG, HDL, CHOLHDL, VLDL, LDLCALC, LDLDIRECT  Physical Exam:    VS:  BP (!) 146/50 (BP Location: Right Arm, Patient Position: Sitting, Cuff Size: Normal)   Pulse 61   Temp 98.1 F (36.7 C)   Ht 5\' 5"  (1.651 m)   Wt 111 lb (50.3 kg)   BMI 18.47 kg/m     Wt Readings from Last 3 Encounters:  01/28/19 111 lb (50.3 kg)  01/24/19 110 lb (49.9 kg)  01/10/19 110 lb (49.9 kg)     GEN:  Thin caucasian female, looks older than stated age, NAD HEENT: Normal NECK: No JVD; No carotid bruits LYMPHATICS: No lymphadenopathy CARDIAC: RRR, no murmurs, rubs, gallops RESPIRATORY:  Clear to auscultation without rales, wheezing or rhonchi  ABDOMEN: Soft, non-tender, non-distended MUSCULOSKELETAL:  No edema; No deformity  SKIN: Warm and dry NEUROLOGIC:  Alert and oriented x 3 PSYCHIATRIC:  Normal affect   ASSESSMENT:    Paroxysmal atrial fibrillation (HCC) Pt noted to be in AF 01/07/2019- possibly secondary to iatrogenic hyperthyroidism.  She converted spontaneously to NSR after adjustment in her Synthroid dose.  PAD (peripheral artery disease) ([place]) Rt popliteal PTA Feb 2015- dopplers stable July  2020  Anticoagulated Eliquis added 01/07/2019- will reduce dose to 2.5 mg BID based on weight less than 60 kg and SCr greater than 1.5  CRI (chronic renal insufficiency), stage 3 (moderate) (HCC) GFR 34- SCr 1.7  Insulin dependent diabetes mellitus IDDM with CRI  Emphysema of lung ([place]) COPD on CXR 2017- 1 PPD smoker  PLAN:    Decrease Eliquis to 2.5 mg BID.  The patient has applied for medication assistance, will look into this. Keep f/u with Dr Gwenlyn Found as scheduled.    Medication Adjustments/Labs and Tests Ordered: Current medicines are reviewed at length with the patient today.  Concerns regarding medicines are outlined above.  Orders Placed This Encounter  Procedures  . EKG 12-Lead   Meds ordered this encounter  Medications  . apixaban (ELIQUIS) 2.5 MG TABS tablet    Sig: Take 1 tablet (2.5 mg total) by mouth 2 (two) times daily.    Dispense:  180 tablet    Refill:  1    Patient Instructions  Medication Instructions:  DECREASE Eliquis to 2.5mg  take 1 tablet twice a day  If you need a refill on your cardiac medications before your next appointment, please call your pharmacy.   Lab work: None  If you have labs (blood work) drawn today and your tests are completely normal, you will receive your results only by: [name] MyChart Message (if you have MyChart) OR . A paper copy in the mail If you have any lab test that is abnormal or we need to change your treatment, we will call you to review the results.  Testing/Procedures: None   Follow-Up: At [facility], you and your health needs are our priority.  As part of our continuing mission to provide you with exceptional heart care, we have created designated Provider Care Teams.  These Care Teams include your primary Cardiologist (physician) and Advanced Practice Providers (APPs -  Physician Assistants and Nurse Practitioners) who all work together to provide you with the care you need, when  you need it. [name] Dashun Borre RECOMMENDS YOU  FOLLOW UP WITH DR Gwenlyn Found AS SCHEDULED  Any Other Special Instructions Will Be Listed Below (If Applicable).      Signed, Kerin Ransom, PA-C  01/28/2019 9:18 AM    [place]

## 2019-01-29 ENCOUNTER — Telehealth: Payer: Self-pay

## 2019-01-29 ENCOUNTER — Telehealth: Payer: Self-pay | Admitting: Cardiovascular Disease

## 2019-01-29 NOTE — Telephone Encounter (Signed)
Attempted to contact patient, went straight to voicemail that was not set up. Will route to nurse to see if she has the patient assistance forms ready to be picked up. Thank you!

## 2019-01-29 NOTE — Telephone Encounter (Signed)
eliquis pt assistance form faxed 8/4

## 2019-01-29 NOTE — Telephone Encounter (Signed)
Follow up    Patient is following up in reference to patient assistance. Please call.

## 2019-01-29 NOTE — Telephone Encounter (Signed)
Follow Up:     Pt called back and said she was returning a call from this morning. She was not sure who called her.[name]

## 2019-01-29 NOTE — Telephone Encounter (Signed)
Routed a previous message to primary nurse.

## 2019-02-04 NOTE — Telephone Encounter (Signed)
I called Eliquis pt assistance program and they said that they do not have this pt in their system will discuss this with RN and fax again [phone]

## 2019-02-04 NOTE — Telephone Encounter (Signed)
This encounter was created in error - please disregard.

## 2019-02-05 NOTE — Telephone Encounter (Signed)
Attempted call to pt mobile number. Unable to leave voicemail

## 2019-02-05 NOTE — Telephone Encounter (Signed)
Patient Assistance for Eliquis re-faxed to [place] at [phone] and [phone] on 02/05/2019.

## 2019-02-19 DIAGNOSIS — N183 Chronic kidney disease, stage 3 (moderate): Secondary | ICD-10-CM | POA: Diagnosis not present

## 2019-02-26 DIAGNOSIS — N183 Chronic kidney disease, stage 3 (moderate): Secondary | ICD-10-CM | POA: Diagnosis not present

## 2019-02-26 DIAGNOSIS — Z1159 Encounter for screening for other viral diseases: Secondary | ICD-10-CM | POA: Diagnosis not present

## 2019-02-26 DIAGNOSIS — I129 Hypertensive chronic kidney disease with stage 1 through stage 4 chronic kidney disease, or unspecified chronic kidney disease: Secondary | ICD-10-CM | POA: Diagnosis not present

## 2019-02-26 DIAGNOSIS — E1122 Type 2 diabetes mellitus with diabetic chronic kidney disease: Secondary | ICD-10-CM | POA: Diagnosis not present

## 2019-02-26 DIAGNOSIS — Z23 Encounter for immunization: Secondary | ICD-10-CM | POA: Diagnosis not present

## 2019-03-05 ENCOUNTER — Ambulatory Visit: Payer: Medicare Other | Admitting: Podiatry

## 2019-03-06 ENCOUNTER — Ambulatory Visit (INDEPENDENT_AMBULATORY_CARE_PROVIDER_SITE_OTHER): Payer: Medicare Other | Admitting: Podiatry

## 2019-03-06 ENCOUNTER — Encounter: Payer: Self-pay | Admitting: Podiatry

## 2019-03-06 ENCOUNTER — Other Ambulatory Visit: Payer: Self-pay

## 2019-03-06 DIAGNOSIS — M79674 Pain in right toe(s): Secondary | ICD-10-CM

## 2019-03-06 DIAGNOSIS — D689 Coagulation defect, unspecified: Secondary | ICD-10-CM | POA: Diagnosis not present

## 2019-03-06 DIAGNOSIS — L84 Corns and callosities: Secondary | ICD-10-CM | POA: Diagnosis not present

## 2019-03-06 DIAGNOSIS — M79675 Pain in left toe(s): Secondary | ICD-10-CM | POA: Diagnosis not present

## 2019-03-06 DIAGNOSIS — B351 Tinea unguium: Secondary | ICD-10-CM

## 2019-03-06 DIAGNOSIS — I999 Unspecified disorder of circulatory system: Secondary | ICD-10-CM

## 2019-03-06 NOTE — Progress Notes (Signed)
Subjective:   Patient ID: Ariel Cobb, female   DOB: 73 y.o.   MRN: [id number]   HPI Patient presents with elongated nailbeds 1-5 both feet that are thick and incurvated and she cannot cut and lesions on the second digit right subsecond metatarsal right that are painful and is on blood thinner and cannot take care of these things herself   ROS      Objective:  Physical Exam  Neurovascular status unchanged with thick yellow brittle nailbeds 1-5 both feet incurvated in the corners and lesion second metatarsal second digit right that are painful when palpated     Assessment:  At risk patient on blood thinner with mycotic nail infection with pain and lesion formation with pain     Plan:  H&P both conditions reviewed and today I went ahead debrided nailbeds 1-5 both feet and then debrided lesions on the right foot with no iatrogenic bleeding and will return for routine care

## 2019-03-21 DIAGNOSIS — F17210 Nicotine dependence, cigarettes, uncomplicated: Secondary | ICD-10-CM | POA: Diagnosis not present

## 2019-03-21 DIAGNOSIS — I480 Paroxysmal atrial fibrillation: Secondary | ICD-10-CM | POA: Diagnosis not present

## 2019-03-21 DIAGNOSIS — E1151 Type 2 diabetes mellitus with diabetic peripheral angiopathy without gangrene: Secondary | ICD-10-CM | POA: Diagnosis not present

## 2019-03-21 DIAGNOSIS — Z794 Long term (current) use of insulin: Secondary | ICD-10-CM | POA: Diagnosis not present

## 2019-03-21 DIAGNOSIS — I10 Essential (primary) hypertension: Secondary | ICD-10-CM | POA: Diagnosis not present

## 2019-03-21 DIAGNOSIS — E113299 Type 2 diabetes mellitus with mild nonproliferative diabetic retinopathy without macular edema, unspecified eye: Secondary | ICD-10-CM | POA: Diagnosis not present

## 2019-03-21 DIAGNOSIS — I700 Atherosclerosis of aorta: Secondary | ICD-10-CM | POA: Diagnosis not present

## 2019-03-21 DIAGNOSIS — I739 Peripheral vascular disease, unspecified: Secondary | ICD-10-CM | POA: Diagnosis not present

## 2019-03-21 DIAGNOSIS — E1122 Type 2 diabetes mellitus with diabetic chronic kidney disease: Secondary | ICD-10-CM | POA: Diagnosis not present

## 2019-03-21 DIAGNOSIS — E039 Hypothyroidism, unspecified: Secondary | ICD-10-CM | POA: Diagnosis not present

## 2019-03-21 DIAGNOSIS — E7800 Pure hypercholesterolemia, unspecified: Secondary | ICD-10-CM | POA: Diagnosis not present

## 2019-03-21 DIAGNOSIS — N183 Chronic kidney disease, stage 3 (moderate): Secondary | ICD-10-CM | POA: Diagnosis not present

## 2019-04-09 ENCOUNTER — Ambulatory Visit (INDEPENDENT_AMBULATORY_CARE_PROVIDER_SITE_OTHER): Payer: Medicare Other | Admitting: Cardiovascular Disease

## 2019-04-09 ENCOUNTER — Telehealth: Payer: Self-pay | Admitting: *Deleted

## 2019-04-09 ENCOUNTER — Other Ambulatory Visit: Payer: Self-pay

## 2019-04-09 ENCOUNTER — Encounter: Payer: Self-pay | Admitting: Cardiovascular Disease

## 2019-04-09 DIAGNOSIS — I480 Paroxysmal atrial fibrillation: Secondary | ICD-10-CM | POA: Diagnosis not present

## 2019-04-09 DIAGNOSIS — E039 Hypothyroidism, unspecified: Secondary | ICD-10-CM | POA: Diagnosis not present

## 2019-04-09 DIAGNOSIS — E782 Mixed hyperlipidemia: Secondary | ICD-10-CM | POA: Diagnosis not present

## 2019-04-09 DIAGNOSIS — I739 Peripheral vascular disease, unspecified: Secondary | ICD-10-CM | POA: Diagnosis not present

## 2019-04-09 DIAGNOSIS — I10 Essential (primary) hypertension: Secondary | ICD-10-CM | POA: Diagnosis not present

## 2019-04-09 DIAGNOSIS — Z720 Tobacco use: Secondary | ICD-10-CM

## 2019-04-09 MED ORDER — ATORVASTATIN CALCIUM 40 MG PO TABS: 40.0000 mg | ORAL_TABLET | ORAL | 3 refills | Status: DC

## 2019-04-09 MED ORDER — METOPROLOL SUCCINATE ER 25 MG PO TB24: 12.5000 mg | ORAL_TABLET | Freq: Every day | ORAL | 3 refills | Status: DC

## 2019-04-09 NOTE — Telephone Encounter (Signed)
14 ZIO XT long term holter monitor to be mailed to the patients home.  Instructions reviewed briefly as they are included in the monitor kit. 

## 2019-04-09 NOTE — Assessment & Plan Note (Signed)
History of PAD status post right popliteal orbital atherectomy by myself 07/29/2013 with an excellent result.  Her follow-up Dopplers after that normalized and her symptoms have claudication resolved.  She no longer has claudication currently and had recent lower extremity arterial Doppler studies performed 01/17/2019 revealing normal ABIs bilaterally with stable right popliteal velocities.  We will repeat lower extremity arterial Doppler studies in the year

## 2019-04-09 NOTE — Assessment & Plan Note (Signed)
History of hypothyroidism on Synthroid replacement with iatrogenic hyperthyroidism back in July leading to atrial fibrillation.  Her Synthroid replacement has since been adjusted and her most recent TSH performed 01/14/2019 was 2.3.

## 2019-04-09 NOTE — Addendum Note (Signed)
Addended by: Annita Brod on: 04/09/2019 09:52 AM   Modules accepted: Orders

## 2019-04-09 NOTE — Assessment & Plan Note (Signed)
History of essential hypertension with blood pressure measured today 154/60.  She is on hydrochlorothiazide ramipril and metoprolol.

## 2019-04-09 NOTE — Assessment & Plan Note (Signed)
History of paroxysmal A. fib currently in sinus rhythm.  She had not started her apixaban because of cost.  Its certainly possible that her A. fib was iatrogenic.  I am going to obtain a 2-week event monitor.  If this shows sinus rhythm we will hold off on oral anticoagulation.

## 2019-04-09 NOTE — Assessment & Plan Note (Signed)
History of hyperlipidemia on statin therapy with recent lipid profile performed 03/21/2019 revealing total cholesterol 156, LDL of 94 and HDL 54.  I am going to increase her atorvastatin from 20 to 40 mg a day and we will recheck lipid liver profile in 2 months.

## 2019-04-09 NOTE — Progress Notes (Signed)
04/09/2019 Ariel Cobb   [birth year]  [id number]  Primary Physician Seward Carol, MD Primary Cardiologist: Lorretta Harp MD [facility] Reddish, [place], [place]  HPI:  Ariel Cobb is a 73 y.o.  [name] appearing widowed Caucasian female no children and who lives alone. I last saw her in the office  01/10/2019. She is accompanied by one of her sisters today.She was referred by Dr. Ila Mcgill at [facility] for evaluation and treatment of claudication. Her primary care physician is Dr. Alta Corning at [facility]. Her cardiovascular risk factors include a 50-pack-year history tobacco abuse currently smoking one pack per day, 2 hypertension, diabetes and hyperlipidemia. Her father died suddenly of microinfarction at age 90. She has never had a heart attack or stroke patient is compared to sleep denies chest pain. She complains of right calf claudication with recent arterial Dopplers performed in our office on 04/25/13 revealing a right ABI 0.93 with a high-frequency signal in the right popliteal artery. She underwent angiography on 12/9 revealing a high-grade right popliteal artery stenosis with three-vessel runoff. Because of moderate renal deficiency her intervention was staged until today.I performed angiography and intervention on her 2 /2/15. She had a 99% calcified lesion in the right popliteal artery but I performed diamondback orbital rotational atherectomy on. Her symptoms of claudication resolved after her procedure and her Dopplers normalized. Her most recent Dopplers performed 02/05/14 revealed ABIs of 1 bilaterally with a widely patent right popliteal artery.  Since I saw her in the office almost 2 years ago she is done well. She still denies claudication. Her recent lower extremity arterial Doppler studies performed in July of last year revealed normal ABIs with moderate increased velocities in the distal right SFA. She was complaining of some tachypalpitations and had a monitor  performed in our office 09/20/2018 that showed episodes of PSVT, nonsustained ventricular tachycardia and PAF. She did have blood work performed by her PCP that showed a TSH 2.03 suggesting that she was hyperthyroid. Her thyroid replacement therapy was down titrated.Since that time she no longer is experiencing palpitations.  When I saw her 3 months ago she was in A. fib with a ventricular spots of 78.  We talked about starting apixaban.  It turns out that she was hyperthyroid and her Synthroid dose was adjusted.  She was also complaining of jaw and chest pain and has since had a negative Myoview and 2D echo.  Since I saw her 3 months ago she has had no further chest pain and is currently in sinus rhythm.    Current Meds  Medication Sig  . albuterol (PROVENTIL HFA;VENTOLIN HFA) 108 (90 BASE) MCG/ACT inhaler Inhale into the lungs every 6 (six) hours as needed for wheezing or shortness of breath.  [name] apixaban (ELIQUIS) 2.5 MG TABS tablet Take 1 tablet (2.5 mg total) by mouth 2 (two) times daily.  [name] apixaban (ELIQUIS) 2.5 MG TABS tablet Take 2.5 mg by mouth 2 (two) times daily.  [name] aspirin EC 81 MG tablet Take 81 mg by mouth daily.  [name] atorvastatin (LIPITOR) 20 MG tablet Take 20 mg by mouth every morning.   . budesonide-formoterol (SYMBICORT) 160-4.5 MCG/ACT inhaler Inhale 2 puffs into the lungs 2 (two) times daily.  . Calcium Carb-Cholecalciferol (CALCIUM 600/VITAMIN D3) 600-800 MG-UNIT TABS Take 1 tablet by mouth daily.   . ferrous sulfate 325 (65 FE) MG tablet Take 325 mg by mouth every other day.  . hydrochlorothiazide (HYDRODIURIL) 25 MG tablet Take 25 mg  by mouth daily.  . Insulin Glargine (LANTUS SOLOSTAR) 100 UNIT/ML Solostar Pen Inject 8 Units into the skin daily at 2 PM.   . levothyroxine (SYNTHROID) 75 MCG tablet   . metoprolol succinate (TOPROL XL) 25 MG 24 hr tablet Take 1 tablet (25 mg total) by mouth daily.  [name] NOVOLOG FLEXPEN 100 UNIT/ML FlexPen Inject 4 Units into the skin 3  (three) times daily. Inject 4 units in the morning, 3 units at lunch. (Sliding scale)  . ONE TOUCH ULTRA TEST test strip TEST THREE TIMES DAILY E11.22  . ramipril (ALTACE) 10 MG capsule Take 20 mg by mouth 2 (two) times daily.   . sodium chloride (OCEAN) 0.65 % SOLN nasal spray Place 1 spray into both nostrils as needed for congestion.     Allergies  Allergen Reactions  . Adhesive [Tape] Itching    Surgical Tape - "makes me itch and break me out" -- HAS TO USE PAPER TAPE  . Amoxicillin Hives, Itching and Swelling  . Latex Itching    Social History   Socioeconomic History  . Marital status: Widowed    Spouse name: Not on file  . Number of children: Not on file  . Years of education: Not on file  . Highest education level: Not on file  Occupational History  . Not on file  Social Needs  . Financial resource strain: Not on file  . Food insecurity    Worry: Not on file    Inability: Not on file  . Transportation needs    Medical: Not on file    Non-medical: Not on file  Tobacco Use  . Smoking status: Current Every Day Smoker    Packs/day: 1.00    Years: 50.00    Pack years: 50.00    Types: Cigarettes  . Smokeless tobacco: Never Used  Substance and Sexual Activity  . Alcohol use: No  . Drug use: No  . Sexual activity: Never  Lifestyle  . Physical activity    Days per week: Not on file    Minutes per session: Not on file  . Stress: Not on file  Relationships  . Social Herbalist on phone: Not on file    Gets together: Not on file    Attends religious service: Not on file    Active member of club or organization: Not on file    Attends meetings of clubs or organizations: Not on file    Relationship status: Not on file  . Intimate partner violence    Fear of current or ex partner: Not on file    Emotionally abused: Not on file    Physically abused: Not on file    Forced sexual activity: Not on file  Other Topics Concern  . Not on file  Social History  Narrative  . Not on file     Review of Systems: General: negative for chills, fever, night sweats or weight changes.  Cardiovascular: negative for chest pain, dyspnea on exertion, edema, orthopnea, palpitations, paroxysmal nocturnal dyspnea or shortness of breath Dermatological: negative for rash Respiratory: negative for cough or wheezing Urologic: negative for hematuria Abdominal: negative for nausea, vomiting, diarrhea, bright red blood per rectum, melena, or hematemesis Neurologic: negative for visual changes, syncope, or dizziness All other systems reviewed and are otherwise negative except as noted above.    Blood pressure (!) 154/60, pulse 65, height 5\' 5"  (1.651 m), weight 116 lb (52.6 kg), SpO2 99 %.  General appearance: alert and  no distress Neck: no adenopathy, no carotid bruit, no JVD, supple, symmetrical, trachea midline and thyroid not enlarged, symmetric, no tenderness/mass/nodules Lungs: clear to auscultation bilaterally Heart: regular rate and rhythm, S1, S2 normal, no murmur, click, rub or gallop Extremities: extremities normal, atraumatic, no cyanosis or edema Pulses: 2+ and symmetric Skin: Skin color, texture, turgor normal. No rashes or lesions Neurologic: Alert and oriented X 3, normal strength and tone. Normal symmetric reflexes. Normal coordination and gait  EKG sinus rhythm at 65 with incomplete right bundle branch block.  I personally reviewed this EKG.  ASSESSMENT AND PLAN:   PAD (peripheral artery disease) (HCC) History of PAD status post right popliteal orbital atherectomy by myself 07/29/2013 with an excellent result.  Her follow-up Dopplers after that normalized and her symptoms have claudication resolved.  She no longer has claudication currently and had recent lower extremity arterial Doppler studies performed 01/17/2019 revealing normal ABIs bilaterally with stable right popliteal velocities.  We will repeat lower extremity arterial Doppler studies in the  year  Essential hypertension History of essential hypertension with blood pressure measured today 154/60.  She is on hydrochlorothiazide ramipril and metoprolol.  Hyperlipidemia History of hyperlipidemia on statin therapy with recent lipid profile performed 03/21/2019 revealing total cholesterol 156, LDL of 94 and HDL 54.  I am going to increase her atorvastatin from 20 to 40 mg a day and we will recheck lipid liver profile in 2 months.  Tobacco abuse History of ongoing tobacco abuse of 1/2 pack/day recalcitrant to risk factor modification.  Hypothyroidism History of hypothyroidism on Synthroid replacement with iatrogenic hyperthyroidism back in July leading to atrial fibrillation.  Her Synthroid replacement has since been adjusted and her most recent TSH performed 01/14/2019 was 2.3.  Paroxysmal atrial fibrillation (HCC) History of paroxysmal A. fib currently in sinus rhythm.  She had not started her apixaban because of cost.  Its certainly possible that her A. fib was iatrogenic.  I am going to obtain a 2-week event monitor.  If this shows sinus rhythm we will hold off on oral anticoagulation.      Lorretta Harp MD FACP,FACC,FAHA, [facility] 04/09/2019 9:21 AM

## 2019-04-09 NOTE — Assessment & Plan Note (Signed)
History of ongoing tobacco abuse of 1/2 pack/day recalcitrant to risk factor modification. 

## 2019-04-09 NOTE — Patient Instructions (Signed)
Medication Instructions:  Your physician has recommended you make the following change in your medication:   INCREASE YOUR ATORVASTATIN (LIPITOR) TO 40 MG BY MOUTH DAILY  DECREASE YOUR METOPROLOL SUCCINATE (TOPROL XL) TO 12.5 MG BY MOUTH DAILY  If you need a refill on your cardiac medications before your next appointment, please call your pharmacy.   Lab work: Your physician recommends that you return for lab work in 2 months: [place]  If you have labs (blood work) drawn today and your tests are completely normal, you will receive your results only by: [name] MyChart Message (if you have MyChart) OR . A paper copy in the mail If you have any lab test that is abnormal or we need to change your treatment, we will call you to review the results.  Testing/Procedures: Your physician has recommended that you wear a 14 DAY ZIO-PATCH monitor. The Zio patch cardiac monitor continuously records heart rhythm data for up to 14 days, this is for patients being evaluated for multiple types heart rhythms. For the first 24 hours post application, please avoid getting the Zio monitor wet in the shower or by excessive sweating during exercise. After that, feel free to carry on with regular activities. Keep soaps and lotions away from the ZIO XT Patch.  This will be placed at our [facility] location - [address] [place].         Follow-Up: At [facility], you and your health needs are our priority.  As part of our continuing mission to provide you with exceptional heart care, we have created designated Provider Care Teams.  These Care Teams include your primary Cardiologist (physician) and Advanced Practice Providers (APPs -  Physician Assistants and Nurse Practitioners) who all work together to provide you with the care you need, when you need it. You will need a follow up appointment in 3 months with Kerin Ransom, PA-C and in 12 months with Dr. Quay Burow.   Please call our office 2 months in advance to schedule each appointment.

## 2019-04-15 ENCOUNTER — Ambulatory Visit (INDEPENDENT_AMBULATORY_CARE_PROVIDER_SITE_OTHER): Payer: Medicare Other

## 2019-04-15 DIAGNOSIS — I480 Paroxysmal atrial fibrillation: Secondary | ICD-10-CM

## 2019-05-09 DIAGNOSIS — I480 Paroxysmal atrial fibrillation: Secondary | ICD-10-CM | POA: Diagnosis not present

## 2019-05-31 ENCOUNTER — Other Ambulatory Visit: Payer: Self-pay

## 2019-05-31 ENCOUNTER — Ambulatory Visit (INDEPENDENT_AMBULATORY_CARE_PROVIDER_SITE_OTHER): Payer: Medicare Other | Admitting: Cardiovascular Disease

## 2019-05-31 ENCOUNTER — Encounter: Payer: Self-pay | Admitting: Cardiovascular Disease

## 2019-05-31 VITALS — BP 145/64 | HR 72 | Temp 97.0°F | Ht 65.0 in | Wt 115.6 lb

## 2019-05-31 DIAGNOSIS — I480 Paroxysmal atrial fibrillation: Secondary | ICD-10-CM

## 2019-05-31 MED ORDER — APIXABAN 2.5 MG PO TABS: 2.5000 mg | ORAL_TABLET | Freq: Two times a day (BID) | ORAL | 3 refills | Status: DC

## 2019-05-31 MED ORDER — METOPROLOL SUCCINATE ER 25 MG PO TB24: 25.0000 mg | ORAL_TABLET | Freq: Every day | ORAL | 3 refills | Status: DC

## 2019-05-31 NOTE — Assessment & Plan Note (Signed)
Ms. Folks returns today for follow-up of her event monitor which resulted on 04/15/2019.  This showed episodes of PAF with RVR, SVT and nonsustained ventricular tachycardia.  She was fairly unaware of most of these rhythm abnormalities except for the episode of PAF.  I am going to put her back on her Eliquis.  Apparently her thyroid status is being managed by Dr. Buddy Duty, her endocrinologist and she is on thyroid replacement therapy.  I am going to increase her Toprol-XL from 12.5 to 25 mg a day.  She has an appointment to see Kerin Ransom, PA-C in the office in January.

## 2019-05-31 NOTE — Progress Notes (Signed)
05/31/2019 Ariel Cobb   December 20, 1945  [id number]  Primary Physician Seward Carol, MD Primary Cardiologist: Lorretta Harp MD [facility] Redd[facility], [place], [place]  HPI:  Ariel Cobb is a 73 y.o.   Ariel Cobb appearing widowed Caucasian female no children and who lives alone. I last saw her in the office  04/09/2019. She is accompanied by one of her sisters today.She was referred by Dr. Ila Mcgill at [facility] for evaluation and treatment of claudication. Her primary care physician is Dr. Alta Corning at [facility]. Her cardiovascular risk factors include a 50-pack-year history tobacco abuse currently smoking one pack per day, 2 hypertension, diabetes and hyperlipidemia. Her father died suddenly of microinfarction at age 88. She has never had a heart attack or stroke patient is compared to sleep denies chest pain. She complains of right calf claudication with recent arterial Dopplers performed in our office on 04/25/13 revealing a right ABI 0.93 with a high-frequency signal in the right popliteal artery. She underwent angiography on 12/9 revealing a high-grade right popliteal artery stenosis with three-vessel runoff. Because of moderate renal deficiency her intervention was staged until today.I performed angiography and intervention on her 2 /2/15. She had a 99% calcified lesion in the right popliteal artery but I performed diamondback orbital rotational atherectomy on. Her symptoms of claudication resolved after her procedure and her Dopplers normalized. Her most recent Dopplers performed 02/05/14 revealed ABIs of 1 bilaterally with a widely patent right popliteal artery.  Since I saw her in the office almost 2 years ago she is done well. She still denies claudication. Her recent lower extremity arterial Doppler studies performed in July of last year revealed normal ABIs with moderate increased velocities in the distal right SFA. She was complaining of some tachypalpitations and had a monitor  performed in our office 09/20/2018 that showed episodes of PSVT, nonsustained ventricular tachycardia and PAF. She did have blood work performed by her PCP that showed a TSH 2.03 suggesting that she was hyperthyroid. Her thyroid replacement therapy was down titrated.Since that time she no longer is experiencing palpitations.  When I saw her 3 months ago she was in A. fib with a ventricular spots of 78.  We talked about starting apixaban.  It turns out that she was hyperthyroid and her Synthroid dose was adjusted.  She was also complaining of jaw and chest pain and has since had a negative Myoview and 2D echo.  Since I saw her 2 months ago she continues to do well.  She did wear a monitor which resulted on 04/15/2019 which showed episodes of PAF with RVR, SVT and nonsustained ventricular tachycardia.  Based on this, I am deciding to put her back on her Eliquis oral anticoagulation.  She is on Synthroid replacement and is followed by Dr. Buddy Duty, her endocrinologist.   Current Meds  Medication Sig  . albuterol (PROVENTIL HFA;VENTOLIN HFA) 108 (90 BASE) MCG/ACT inhaler Inhale into the lungs every 6 (six) hours as needed for wheezing or shortness of breath.  Ariel Kitchen apixaban (ELIQUIS) 2.5 MG TABS tablet Take 2.5 mg by mouth 2 (two) times daily. On hold  . aspirin EC 81 MG tablet Take 81 mg by mouth daily.  Ariel Kitchen atorvastatin (LIPITOR) 40 MG tablet Take 1 tablet (40 mg total) by mouth every morning.  . budesonide-formoterol (SYMBICORT) 160-4.5 MCG/ACT inhaler Inhale 2 puffs into the lungs 2 (two) times daily.  . Calcium Carb-Cholecalciferol (CALCIUM 600/VITAMIN D3) 600-800 MG-UNIT TABS Take 1 tablet by mouth daily.   Ariel Kitchen  ferrous sulfate 325 (65 FE) MG tablet Take 325 mg by mouth every other day.  . hydrochlorothiazide (HYDRODIURIL) 25 MG tablet Take 25 mg by mouth daily.  . Insulin Glargine (LANTUS SOLOSTAR) 100 UNIT/ML Solostar Pen Inject 8 Units into the skin daily at 2 PM.   . levothyroxine (SYNTHROID) 75 MCG  tablet   . metoprolol succinate (TOPROL XL) 25 MG 24 hr tablet Take 0.5 tablets (12.5 mg total) by mouth daily.  Ariel Kitchen NOVOLOG FLEXPEN 100 UNIT/ML FlexPen Inject 4 Units into the skin 3 (three) times daily. Inject 4 units in the morning, 3 units at lunch. (Sliding scale)  . ONE TOUCH ULTRA TEST test strip TEST THREE TIMES DAILY E11.22  . ramipril (ALTACE) 10 MG capsule Take 20 mg by mouth 2 (two) times daily.   . sodium chloride (OCEAN) 0.65 % SOLN nasal spray Place 1 spray into both nostrils as needed for congestion.  . [DISCONTINUED] apixaban (ELIQUIS) 2.5 MG TABS tablet Take 1 tablet (2.5 mg total) by mouth 2 (two) times daily.  . [DISCONTINUED] atorvastatin (LIPITOR) 20 MG tablet Take 20 mg by mouth daily.     Allergies  Allergen Reactions  . Adhesive [Tape] Itching    Surgical Tape - "makes me itch and break me out" -- HAS TO USE PAPER TAPE  . Amoxicillin Hives, Itching and Swelling  . Latex Itching    Social History   Socioeconomic History  . Marital status: Widowed    Spouse name: Not on file  . Number of children: Not on file  . Years of education: Not on file  . Highest education level: Not on file  Occupational History  . Not on file  Social Needs  . Financial resource strain: Not on file  . Food insecurity    Worry: Not on file    Inability: Not on file  . Transportation needs    Medical: Not on file    Non-medical: Not on file  Tobacco Use  . Smoking status: Current Every Day Smoker    Packs/day: 1.00    Years: 50.00    Pack years: 50.00    Types: Cigarettes  . Smokeless tobacco: Never Used  Substance and Sexual Activity  . Alcohol use: No  . Drug use: No  . Sexual activity: Never  Lifestyle  . Physical activity    Days per week: Not on file    Minutes per session: Not on file  . Stress: Not on file  Relationships  . Social Herbalist on phone: Not on file    Gets together: Not on file    Attends religious service: Not on file    Active  member of club or organization: Not on file    Attends meetings of clubs or organizations: Not on file    Relationship status: Not on file  . Intimate partner violence    Fear of current or ex partner: Not on file    Emotionally abused: Not on file    Physically abused: Not on file    Forced sexual activity: Not on file  Other Topics Concern  . Not on file  Social History Narrative  . Not on file     Review of Systems: General: negative for chills, fever, night sweats or weight changes.  Cardiovascular: negative for chest pain, dyspnea on exertion, edema, orthopnea, palpitations, paroxysmal nocturnal dyspnea or shortness of breath Dermatological: negative for rash Respiratory: negative for cough or wheezing Urologic: negative for hematuria Abdominal:  negative for nausea, vomiting, diarrhea, bright red blood per rectum, melena, or hematemesis Neurologic: negative for visual changes, syncope, or dizziness All other systems reviewed and are otherwise negative except as noted above.    Blood pressure (!) 145/64, pulse 72, temperature (!) 97 F (36.1 C), height 5\' 5"  (1.651 m), weight 115 lb 9.6 oz (52.4 kg), SpO2 97 %.  General appearance: alert and no distress Neck: no adenopathy, no carotid bruit, no JVD, supple, symmetrical, trachea midline and thyroid not enlarged, symmetric, no tenderness/mass/nodules Lungs: clear to auscultation bilaterally Heart: regular rate and rhythm, S1, S2 normal, no murmur, click, rub or gallop Extremities: extremities normal, atraumatic, no cyanosis or edema Pulses: 2+ and symmetric Skin: Skin color, texture, turgor normal. No rashes or lesions Neurologic: Alert and oriented X 3, normal strength and tone. Normal symmetric reflexes. Normal coordination and gait  EKG not performed today  ASSESSMENT AND PLAN:   Paroxysmal atrial fibrillation ([place]) Ms. Binion returns today for follow-up of her event monitor which resulted on 04/15/2019.  This showed  episodes of PAF with RVR, SVT and nonsustained ventricular tachycardia.  She was fairly unaware of most of these rhythm abnormalities except for the episode of PAF.  I am going to put her back on her Eliquis.  Apparently her thyroid status is being managed by Dr. Buddy Duty, her endocrinologist and she is on thyroid replacement therapy.  I am going to increase her Toprol-XL from 12.5 to 25 mg a day.  She has an appointment to see Kerin Ransom, PA-C in the office in January.      Lorretta Harp MD FACP,FACC,FAHA, [facility] 05/31/2019 3:24 PM

## 2019-05-31 NOTE — Patient Instructions (Signed)
Medication Instructions:  Start taking 1 whole tablet of Metoprolol Daily.  Take 2.5mg  Eliquis Daily  If you need a refill on your cardiac medications before your next appointment, please call your pharmacy.   Lab work: NONE  Testing/Procedures: NONE  Follow-Up: At [facility], you and your health needs are our priority.  As part of our continuing mission to provide you with exceptional heart care, we have created designated Provider Care Teams.  These Care Teams include your primary Cardiologist (physician) and Advanced Practice Providers (APPs -  Physician Assistants and Nurse Practitioners) who all work together to provide you with the care you need, when you need it. You may see Quay Burow, MD or one of the following Advanced Practice Providers on your designated Care Team:    Kerin Ransom, PA-C  [place], [place]  Coletta Memos, [place] Your physician wants you to follow-up in: 1 year.

## 2019-06-04 DIAGNOSIS — Z720 Tobacco use: Secondary | ICD-10-CM | POA: Diagnosis not present

## 2019-06-04 DIAGNOSIS — Z794 Long term (current) use of insulin: Secondary | ICD-10-CM | POA: Diagnosis not present

## 2019-06-04 DIAGNOSIS — E113299 Type 2 diabetes mellitus with mild nonproliferative diabetic retinopathy without macular edema, unspecified eye: Secondary | ICD-10-CM | POA: Diagnosis not present

## 2019-06-04 DIAGNOSIS — E139 Other specified diabetes mellitus without complications: Secondary | ICD-10-CM | POA: Diagnosis not present

## 2019-06-05 ENCOUNTER — Ambulatory Visit: Payer: Medicare Other | Admitting: Podiatry

## 2019-06-27 DIAGNOSIS — I480 Paroxysmal atrial fibrillation: Secondary | ICD-10-CM | POA: Diagnosis not present

## 2019-06-27 DIAGNOSIS — D509 Iron deficiency anemia, unspecified: Secondary | ICD-10-CM | POA: Diagnosis not present

## 2019-06-27 DIAGNOSIS — J45909 Unspecified asthma, uncomplicated: Secondary | ICD-10-CM | POA: Diagnosis not present

## 2019-06-27 DIAGNOSIS — E039 Hypothyroidism, unspecified: Secondary | ICD-10-CM | POA: Diagnosis not present

## 2019-06-27 DIAGNOSIS — J449 Chronic obstructive pulmonary disease, unspecified: Secondary | ICD-10-CM | POA: Diagnosis not present

## 2019-06-27 DIAGNOSIS — D638 Anemia in other chronic diseases classified elsewhere: Secondary | ICD-10-CM | POA: Diagnosis not present

## 2019-06-27 DIAGNOSIS — E1151 Type 2 diabetes mellitus with diabetic peripheral angiopathy without gangrene: Secondary | ICD-10-CM | POA: Diagnosis not present

## 2019-06-27 DIAGNOSIS — E1122 Type 2 diabetes mellitus with diabetic chronic kidney disease: Secondary | ICD-10-CM | POA: Diagnosis not present

## 2019-06-27 DIAGNOSIS — M810 Age-related osteoporosis without current pathological fracture: Secondary | ICD-10-CM | POA: Diagnosis not present

## 2019-06-27 DIAGNOSIS — E782 Mixed hyperlipidemia: Secondary | ICD-10-CM | POA: Diagnosis not present

## 2019-06-27 DIAGNOSIS — E1121 Type 2 diabetes mellitus with diabetic nephropathy: Secondary | ICD-10-CM | POA: Diagnosis not present

## 2019-06-27 DIAGNOSIS — E139 Other specified diabetes mellitus without complications: Secondary | ICD-10-CM | POA: Diagnosis not present

## 2019-07-02 ENCOUNTER — Telehealth: Payer: Self-pay

## 2019-07-02 DIAGNOSIS — E782 Mixed hyperlipidemia: Secondary | ICD-10-CM

## 2019-07-02 LAB — LIPID PANEL
Chol/HDL Ratio: 2.8 ratio (ref 0.0–4.4)
Cholesterol, Total: 161 mg/dL (ref 100–199)
HDL: 58 mg/dL (ref >39)
LDL Chol Calc (NIH): 88 mg/dL (ref 0–99)
Triglycerides: 81 mg/dL (ref 0–149)
VLDL Cholesterol Cal: 15 mg/dL (ref 5–40)

## 2019-07-02 LAB — HEPATIC FUNCTION PANEL
ALT: 9 IU/L (ref 0–32)
AST: 23 IU/L (ref 0–40)
Albumin: 4.2 g/dL (ref 3.7–4.7)
Alkaline Phosphatase: 69 IU/L (ref 39–117)
Bilirubin Total: 0.5 mg/dL (ref 0.0–1.2)
Bilirubin, Direct: 0.15 mg/dL (ref 0.00–0.40)
Total Protein: 7.2 g/dL (ref 6.0–8.5)

## 2019-07-02 MED ORDER — ATORVASTATIN CALCIUM 80 MG PO TABS: 80.0000 mg | ORAL_TABLET | ORAL | 3 refills | Status: DC

## 2019-07-02 NOTE — Telephone Encounter (Signed)
Called pt with lab results. Changed Atorvastatin prescription to 80mg  daily per Dr Gwenlyn Found. Put in new lab orders and mailed slip. Pt verbalized understanding.

## 2019-07-02 NOTE — Telephone Encounter (Signed)
-----   Message from Lorretta Harp, MD sent at 07/02/2019  6:47 AM EST ----- Not at goal for secondary prevention. Increase Atorva to 80 mg and re check in 2 months

## 2019-07-04 ENCOUNTER — Ambulatory Visit (INDEPENDENT_AMBULATORY_CARE_PROVIDER_SITE_OTHER): Payer: Medicare Other | Admitting: Podiatry

## 2019-07-04 ENCOUNTER — Encounter: Payer: Self-pay | Admitting: Podiatry

## 2019-07-04 ENCOUNTER — Other Ambulatory Visit: Payer: Self-pay

## 2019-07-04 DIAGNOSIS — L84 Corns and callosities: Secondary | ICD-10-CM

## 2019-07-04 DIAGNOSIS — M79675 Pain in left toe(s): Secondary | ICD-10-CM | POA: Diagnosis not present

## 2019-07-04 DIAGNOSIS — M79674 Pain in right toe(s): Secondary | ICD-10-CM

## 2019-07-04 DIAGNOSIS — B351 Tinea unguium: Secondary | ICD-10-CM | POA: Diagnosis not present

## 2019-07-04 DIAGNOSIS — D689 Coagulation defect, unspecified: Secondary | ICD-10-CM | POA: Diagnosis not present

## 2019-07-05 NOTE — Progress Notes (Signed)
Subjective:   Patient ID: Ariel Cobb, female   DOB: 74 y.o.   MRN: [id number]   HPI Patient presents with chronic nail disease 1-5 both feet that are thick and incurvated and also with a keratotic lesion digit to right that is painful with patient on blood thinner not able to take care of herself   ROS      Objective:  Physical Exam  Thick yellow mycotic nail infection 1-5 both feet painful with lesion second digit right distal that is painful when palpated     Assessment:  Chronic mycotic nail infection 1-5 both feet with lesion second digit right     Plan:  H&P condition reviewed and I debrided nailbeds today 1-5 and then I debrided lesion second right with no iatrogenic bleeding and reappoint for routine care as needed

## 2019-07-10 ENCOUNTER — Ambulatory Visit (INDEPENDENT_AMBULATORY_CARE_PROVIDER_SITE_OTHER): Payer: Medicare Other | Admitting: Cardiology

## 2019-07-10 ENCOUNTER — Encounter: Payer: Self-pay | Admitting: Cardiology

## 2019-07-10 ENCOUNTER — Other Ambulatory Visit: Payer: Self-pay

## 2019-07-10 VITALS — BP 142/76 | HR 55 | Temp 97.3°F | Ht 65.0 in | Wt 118.0 lb

## 2019-07-10 DIAGNOSIS — N183 Chronic kidney disease, stage 3 unspecified: Secondary | ICD-10-CM

## 2019-07-10 DIAGNOSIS — I10 Essential (primary) hypertension: Secondary | ICD-10-CM | POA: Diagnosis not present

## 2019-07-10 DIAGNOSIS — I480 Paroxysmal atrial fibrillation: Secondary | ICD-10-CM

## 2019-07-10 DIAGNOSIS — E119 Type 2 diabetes mellitus without complications: Secondary | ICD-10-CM | POA: Diagnosis not present

## 2019-07-10 DIAGNOSIS — J439 Emphysema, unspecified: Secondary | ICD-10-CM | POA: Diagnosis not present

## 2019-07-10 DIAGNOSIS — Z794 Long term (current) use of insulin: Secondary | ICD-10-CM

## 2019-07-10 DIAGNOSIS — I739 Peripheral vascular disease, unspecified: Secondary | ICD-10-CM

## 2019-07-10 NOTE — Patient Instructions (Signed)
Medication Instructions:  TAKE your Eliquis 2.5mg  Take 1 tablet twice a day  *If you need a refill on your cardiac medications before your next appointment, please call your pharmacy*  Lab Work: None  If you have labs (blood work) drawn today and your tests are completely normal, you will receive your results only by: [name] MyChart Message (if you have MyChart) OR . A paper copy in the mail If you have any lab test that is abnormal or we need to change your treatment, we will call you to review the results.  Testing/Procedures: None   Follow-Up: At [facility], you and your health needs are our priority.  As part of our continuing mission to provide you with exceptional heart care, we have created designated Provider Care Teams.  These Care Teams include your primary Cardiologist (physician) and Advanced Practice Providers (APPs -  Physician Assistants and Nurse Practitioners) who all work together to provide you with the care you need, when you need it.  Your next appointment:   6 month(s)  The format for your next appointment:   In Person  Provider:   Quay Burow, MD  Other Instructions

## 2019-07-10 NOTE — Progress Notes (Signed)
Darden Dates can you let Ms Boze know Dr Gwenlyn Found said it was OK to stop her aspirin.  Kerin Ransom PA-C 07/10/2019 4:41 PM

## 2019-07-10 NOTE — Progress Notes (Signed)
Cardiology Office Note:    Date:  07/10/2019   ID:  AUDREY ALONZO, DOB 1945/11/09, MRN [id number]  PCP:  Seward Carol, MD  Cardiologist:  Quay Burow, MD  Electrophysiologist:  None   Referring MD: Seward Carol, MD   No chief complaint on file.   History of Present Illness:    Ariel Cobb is a 74 y.o. widow who lives alone in her own home with a hx of IDDM, PVD status post right popliteal PTA in 2015, COPD, 1/2-1 PPD smoker currently, HTN, and CRI stage III.  She had a monitor placed in March 2020 that showed PSVT and PAF.  Dr. Gwenlyn Found saw her in the office January 07, 2019 and she was in atrial fibrillation.  She is relatively asymptomatic although she did have some jaw pain prior to that visit in the setting of increased heart rate at night.  Turns out she was mildly iatrogenically hyperthyroid and her Synthroid dose was adjusted.  Her symptoms improved with this.  Dr. Gwenlyn Found ordered an echo, Myoview, and LE a Dopplers.    Echoshowed normal LV function with mild LVH and normal LA.  Myoview study was negative for ischemia.  Lower extremity arterial Doppler showed no change from prior study.  She has had documented recurrent PAF on a monitor in Nov 2020 and long term anticoagulation with Eliquis 2.5 mg BID has been recommended.  The patient is seen today in follow up.  She is not taking the Eliquis BID secondary to bruising and cost. She is in NSR today.  She does note occasional brief palpitations. She denies any chest pain or unusual dyspnea.    Past Medical History:  Diagnosis Date  . Anemia    slight told feb dr polite  . Arthritis   . Asthma   . Chronic bronchitis ([place])   . Chronic kidney disease    sees dr sanford yearly stage 3 stable  . Claudication ([place])   . Complication of anesthesia 2000   slow to awaken after hip surgery  . Hyperlipidemia   . Hypertension   . Hypothyroidism   . Osteoporosis   . PAD (peripheral artery disease) ([place])    a. 07/29/13: s/p PV  angiogram with successful diamondback orbital rotational atherectomy of the high-grade calcified R popliteal artery stenosis  . Shortness of breath    on exertion  . Thyroid disease   . Type II diabetes mellitus ([place])     Past Surgical History:  Procedure Laterality Date  . ABDOMINAL AORTAGRAM  06/04/2013   Procedure: ABDOMINAL [name];  Surgeon: Lorretta Harp, MD;  Location: [facility] C[facility] LAB;  Service: Cardiovascular;;  . ATHERECTOMY Right 07/29/2013   popliteal/notes 07/29/2013  . CHOLECYSTECTOMY  1980's?  . COLONOSCOPY WITH PROPOFOL N/A 09/06/2016   Procedure: COLONOSCOPY WITH PROPOFOL;  Surgeon: Garlan Fair, MD;  Location: [facility] ENDOSCOPY;  Service: Endoscopy;  Laterality: N/A;  . cyst from right hand    . ESOPHAGOGASTRODUODENOSCOPY (EGD) WITH PROPOFOL N/A 09/06/2016   Procedure: ESOPHAGOGASTRODUODENOSCOPY (EGD) WITH PROPOFOL;  Surgeon: Garlan Fair, MD;  Location: [facility] ENDOSCOPY;  Service: Endoscopy;  Laterality: N/A;  . HIP FRACTURE SURGERY Left 2000's   "I broke the ball; dr broke femur during OR; got a rod in there" 92/07/2013)  . LOWER EXTREMITY ANGIOGRAM N/A 06/04/2013   Procedure: LOWER EXTREMITY ANGIOGRAM;  Surgeon: Lorretta Harp, MD;  Location: [facility] CATH LAB;  Service: Cardiovascular;  Laterality: N/A;    Current Medications: Current Meds  Medication Sig  .  albuterol (PROVENTIL HFA;VENTOLIN HFA) 108 (90 BASE) MCG/ACT inhaler Inhale into the lungs every 6 (six) hours as needed for wheezing or shortness of breath.  [name] apixaban (ELIQUIS) 2.5 MG TABS tablet Take 1 tablet (2.5 mg total) by mouth 2 (two) times daily. On hold  . aspirin EC 81 MG tablet Take 81 mg by mouth daily.  [name] atorvastatin (LIPITOR) 80 MG tablet Take 1 tablet (80 mg total) by mouth every morning.  . budesonide-formoterol (SYMBICORT) 160-4.5 MCG/ACT inhaler Inhale 2 puffs into the lungs 2 (two) times daily.  . Calcium Carb-Cholecalciferol (CALCIUM 600/VITAMIN D3) 600-800 MG-UNIT TABS Take 1 tablet by mouth  daily.   . ferrous sulfate 325 (65 FE) MG tablet Take 325 mg by mouth every other day.  . hydrochlorothiazide (HYDRODIURIL) 25 MG tablet Take 25 mg by mouth daily.  . Insulin Glargine (LANTUS SOLOSTAR) 100 UNIT/ML Solostar Pen Inject 8 Units into the skin daily at 2 PM.   . levothyroxine (SYNTHROID) 75 MCG tablet   . metoprolol succinate (TOPROL XL) 25 MG 24 hr tablet Take 1 tablet (25 mg total) by mouth daily.  [name] NOVOLOG FLEXPEN 100 UNIT/ML FlexPen Inject 4 Units into the skin 3 (three) times daily. Inject 4 units in the morning, 3 units at lunch. (Sliding scale)  . ONE TOUCH ULTRA TEST test strip TEST THREE TIMES DAILY E11.22  . ramipril (ALTACE) 10 MG capsule Take 20 mg by mouth 2 (two) times daily.   . sodium chloride (OCEAN) 0.65 % SOLN nasal spray Place 1 spray into both nostrils as needed for congestion.     Allergies:   Adhesive [tape], Amoxicillin, and Latex   Social History   Socioeconomic History  . Marital status: Widowed    Spouse name: Not on file  . Number of children: Not on file  . Years of education: Not on file  . Highest education level: Not on file  Occupational History  . Not on file  Tobacco Use  . Smoking status: Current Every Day Smoker    Packs/day: 1.00    Years: 50.00    Pack years: 50.00    Types: Cigarettes  . Smokeless tobacco: Never Used  Substance and Sexual Activity  . Alcohol use: No  . Drug use: No  . Sexual activity: Never  Other Topics Concern  . Not on file  Social History Narrative  . Not on file   Social Determinants of Health   Financial Resource Strain:   . Difficulty of Paying Living Expenses: Not on file  Food Insecurity:   . Worried About Charity fundraiser in the Last Year: Not on file  . Ran Out of Food in the Last Year: Not on file  Transportation Needs:   . Lack of Transportation (Medical): Not on file  . Lack of Transportation (Non-Medical): Not on file  Physical Activity:   . Days of Exercise per Week: Not on file   . Minutes of Exercise per Session: Not on file  Stress:   . Feeling of Stress : Not on file  Social Connections:   . Frequency of Communication with Friends and Family: Not on file  . Frequency of Social Gatherings with Friends and Family: Not on file  . Attends Religious Services: Not on file  . Active Member of Clubs or Organizations: Not on file  . Attends Archivist Meetings: Not on file  . Marital Status: Not on file     Family History: The patient's family history includes  Heart disease in her father.  ROS:   Please see the history of present illness.     All other systems reviewed and are negative.  EKGs/Labs/Other Studies Reviewed:    The following studies were reviewed today: Myoview 01/24/2019 Echo 01/24/2019 Holter Nov 2020  EKG:  EKG is ordered today.  The ekg ordered today demonstrates NSR, HR 55, incomplete RBBB  Recent Labs: 01/11/2019: BUN 47; Creatinine, Ser 1.70; Potassium 4.1; Sodium 137 07/01/2019: ALT 9  Recent Lipid Panel    Component Value Date/Time   CHOL 161 07/01/2019 0807   TRIG 81 07/01/2019 0807   HDL 58 07/01/2019 0807   CHOLHDL 2.8 07/01/2019 0807   LDLCALC 88 07/01/2019 0807    Physical Exam:    VS:  BP (!) 142/76   Pulse (!) 55   Temp (!) 97.3 F (36.3 C)   Ht 5\' 5"  (1.651 m)   Wt 118 lb (53.5 kg)   SpO2 97%   BMI 19.64 kg/m     Wt Readings from Last 3 Encounters:  07/10/19 118 lb (53.5 kg)  05/31/19 115 lb 9.6 oz (52.4 kg)  04/09/19 116 lb (52.6 kg)     GEN:  Thin Caucasian female well developed in no acute distress HEENT: Normal NECK: No JVD; No carotid bruits CARDIAC: RRR, no murmurs, rubs, gallops RESPIRATORY:  Clear to auscultation without rales, wheezing or rhonchi  ABDOMEN: Soft, non-tender, non-distended MUSCULOSKELETAL: Severe kyphosis No edema; No deformity  SKIN: Warm and dry NEUROLOGIC:  Alert and oriented x 3 PSYCHIATRIC:  Normal affect   ASSESSMENT:    Paroxysmal atrial fibrillation  (HCC) NSR-SB today.  When her Toprol was decreased in the past she went back into AF.  PAD (peripheral artery disease) (HCC) Rt popliteal PTA Feb 2015- dopplers stable July 2020  Anticoagulated CHADS VASC= 5. Eliquis dose 2.5 mg BID based on weight less than 60 kg and SCr greater than 1.5  CRI (chronic renal insufficiency), stage 3 (moderate) (HCC) GFR 34- SCr 1.7  Insulin dependent diabetes mellitus IDDM with CRI  Emphysema of lung ([place]) COPD on CXR 2017- 1 PPD smoker  PLAN:    I told her taking Eliquis QD was not giving her any protection against stroke.  I reviewed her CAHDS VASC score with her.  I'll ask Dr Gwenlyn Found about possibly stopping her ASA secondary to bruising.  F/U 6 months.    Medication Adjustments/Labs and Tests Ordered: Current medicines are reviewed at length with the patient today.  Concerns regarding medicines are outlined above.  No orders of the defined types were placed in this encounter.  No orders of the defined types were placed in this encounter.   Patient Instructions  Medication Instructions:  TAKE your Eliquis 2.5mg  Take 1 tablet twice a day  *If you need a refill on your cardiac medications before your next appointment, please call your pharmacy*  Lab Work: None  If you have labs (blood work) drawn today and your tests are completely normal, you will receive your results only by: [name] MyChart Message (if you have MyChart) OR . A paper copy in the mail If you have any lab test that is abnormal or we need to change your treatment, we will call you to review the results.  Testing/Procedures: None   Follow-Up: At [facility], you and your health needs are our priority.  As part of our continuing mission to provide you with exceptional heart care, we have created designated Provider Care Teams.  These Care  Teams include your primary Cardiologist (physician) and Advanced Practice Providers (APPs -  Physician Assistants and Nurse Practitioners)  who all work together to provide you with the care you need, when you need it.  Your next appointment:   6 month(s)  The format for your next appointment:   In Person  Provider:   Quay Burow, MD  Other Instructions     Signed, Kerin Ransom, PA-C  07/10/2019 8:32 AM    [place]

## 2019-07-11 ENCOUNTER — Telehealth: Payer: Self-pay

## 2019-07-11 NOTE — Telephone Encounter (Signed)
Patient notified directly and voiced understanding.  

## 2019-07-11 NOTE — Telephone Encounter (Signed)
-----   Message from Erlene Quan, [place] sent at 07/10/2019  4:41 PM EST -----   ----- Message ----- From: Lorretta Harp, MD Sent: 07/10/2019  12:47 PM EST To: Erlene Quan, PA-C  Yes.  JJB ----- Message ----- From: Ilean China Sent: 07/10/2019   8:47 AM EST To: Lorretta Harp, MD  OK to stop her ASA secondary to bruising ?

## 2019-07-11 NOTE — Telephone Encounter (Signed)
Darden Dates can you let Ms Manfra know Dr Gwenlyn Found said it was OK to stop her aspirin.  Kerin Ransom PA-C 07/10/2019 4:41 PM

## 2019-07-16 NOTE — Addendum Note (Signed)
Addended by: Jacqulynn Cadet on: 07/16/2019 10:29 AM   Modules accepted: Orders

## 2019-07-31 DIAGNOSIS — D485 Neoplasm of uncertain behavior of skin: Secondary | ICD-10-CM | POA: Diagnosis not present

## 2019-07-31 DIAGNOSIS — R238 Other skin changes: Secondary | ICD-10-CM | POA: Diagnosis not present

## 2019-07-31 DIAGNOSIS — L280 Lichen simplex chronicus: Secondary | ICD-10-CM | POA: Diagnosis not present

## 2019-08-20 ENCOUNTER — Telehealth: Payer: Self-pay | Admitting: Cardiovascular Disease

## 2019-08-20 MED ORDER — METOPROLOL SUCCINATE ER 25 MG PO TB24: 25.0000 mg | ORAL_TABLET | Freq: Every day | ORAL | 3 refills | Status: DC

## 2019-08-20 NOTE — Telephone Encounter (Signed)
New message   Patient states that there is a discrepancy with the dosage of the medication metoprolol succinate (TOPROL XL) 25 MG 24 hr tablet. Please advise the correct dosage.

## 2019-08-20 NOTE — Telephone Encounter (Signed)
Spoke with patient. Clarified Metoprolol dosage is to take 25mg  daily. The pharmacy gave her a prescription that says to take 1/2 pill daily.  New script sent to [facility] per patient request.

## 2019-08-28 DIAGNOSIS — L280 Lichen simplex chronicus: Secondary | ICD-10-CM | POA: Diagnosis not present

## 2019-08-28 DIAGNOSIS — D485 Neoplasm of uncertain behavior of skin: Secondary | ICD-10-CM | POA: Diagnosis not present

## 2019-09-03 DIAGNOSIS — E782 Mixed hyperlipidemia: Secondary | ICD-10-CM | POA: Diagnosis not present

## 2019-09-03 LAB — LIPID PANEL
Chol/HDL Ratio: 2.6 ratio (ref 0.0–4.4)
Cholesterol, Total: 137 mg/dL (ref 100–199)
HDL: 53 mg/dL (ref >39)
LDL Chol Calc (NIH): 68 mg/dL (ref 0–99)
Triglycerides: 86 mg/dL (ref 0–149)
VLDL Cholesterol Cal: 16 mg/dL (ref 5–40)

## 2019-09-03 LAB — HEPATIC FUNCTION PANEL
ALT: 9 IU/L (ref 0–32)
AST: 22 IU/L (ref 0–40)
Albumin: 3.9 g/dL (ref 3.7–4.7)
Alkaline Phosphatase: 75 IU/L (ref 39–117)
Bilirubin Total: 0.5 mg/dL (ref 0.0–1.2)
Bilirubin, Direct: 0.19 mg/dL (ref 0.00–0.40)
Total Protein: 7 g/dL (ref 6.0–8.5)

## 2019-09-11 ENCOUNTER — Other Ambulatory Visit: Payer: Self-pay

## 2019-09-11 ENCOUNTER — Encounter: Payer: Self-pay | Admitting: Podiatrist

## 2019-09-11 ENCOUNTER — Ambulatory Visit (INDEPENDENT_AMBULATORY_CARE_PROVIDER_SITE_OTHER): Payer: Medicare Other

## 2019-09-11 ENCOUNTER — Other Ambulatory Visit: Payer: Self-pay | Admitting: Podiatrist

## 2019-09-11 ENCOUNTER — Ambulatory Visit (INDEPENDENT_AMBULATORY_CARE_PROVIDER_SITE_OTHER): Payer: Medicare Other | Admitting: Podiatrist

## 2019-09-11 VITALS — Temp 97.5°F

## 2019-09-11 DIAGNOSIS — S9031XA Contusion of right foot, initial encounter: Secondary | ICD-10-CM

## 2019-09-11 DIAGNOSIS — S9001XA Contusion of right ankle, initial encounter: Secondary | ICD-10-CM

## 2019-09-11 DIAGNOSIS — M79671 Pain in right foot: Secondary | ICD-10-CM

## 2019-09-11 NOTE — Patient Instructions (Signed)
Apply dry heat on the bruised area for 20-30 minutes 2-3 times per day.  Wear the compressive sleeve if you can tolerate it-    If you notice any increase in swelling, redness, chills, streaking, or drainage please call to be seen.

## 2019-09-11 NOTE — Progress Notes (Signed)
Chief Complaint  Patient presents with  . Ankle Injury    Right Ankle-lateral side; pt stated, "A frozen ice cream carton fell on my ankle; there is a knot above my ankle (lateral side)"; Since August 26, 2019  . Foot Injury    Right Foot-dorsal; Since September 15, 2019; pt Diabetic Type 2; Sugar=130 this am; A1C=pt stated, "6.7"-not in chart  . Callouses    Right foot; bottom of heel     HPI: Patient is 74 y.o. female who presents today for the complaints listed above.     Allergies  Allergen Reactions  . Adhesive [Tape] Itching    Surgical Tape - "makes me itch and break me out" -- HAS TO USE PAPER TAPE  . Amoxicillin Hives, Itching and Swelling  . Latex Itching    Review of systems is reviewed and negative.   Physical Exam  Patient is awake, alert, and oriented x 3.  In no acute distress.    Vascular status is decreased with faint  palpable pedal pulses DP and PT bilateral and capillary refill time less than 3 seconds bilateral.  No edema or erythema noted. Cool to cool temperature gradient noted.   Neurological exam reveals epicritic and protective sensation grossly intact bilateral.   Dermatological exam reveals skin is supple and dry to bilateral feet.  No open lesions present.   Small hpk medial side of first met eminence.  Hard induration lateral right ankle with associated bruising consistent with hematoma  Musculoskeletal exam: Musculature intact with dorsiflexion, plantarflexion, inversion, eversion. Ankle and First MPJ joint range of motion normal.    xrays are negative for fracture or dislocation- no acute osseous abnormalities present.   Assessment: Hematoma right ankle s/p injury by way of ice cream carton falling on the fibula  Plan: Recommended dry heat to the area, also recommended compressive surgi grip wrap if she is able to tolerate.  She will continue to elevate the foot at home and will call if any concerns arise.

## 2019-09-18 DIAGNOSIS — E1122 Type 2 diabetes mellitus with diabetic chronic kidney disease: Secondary | ICD-10-CM | POA: Diagnosis not present

## 2019-09-18 DIAGNOSIS — M810 Age-related osteoporosis without current pathological fracture: Secondary | ICD-10-CM | POA: Diagnosis not present

## 2019-09-18 DIAGNOSIS — E113299 Type 2 diabetes mellitus with mild nonproliferative diabetic retinopathy without macular edema, unspecified eye: Secondary | ICD-10-CM | POA: Diagnosis not present

## 2019-09-18 DIAGNOSIS — Z1389 Encounter for screening for other disorder: Secondary | ICD-10-CM | POA: Diagnosis not present

## 2019-09-18 DIAGNOSIS — I739 Peripheral vascular disease, unspecified: Secondary | ICD-10-CM | POA: Diagnosis not present

## 2019-09-18 DIAGNOSIS — D638 Anemia in other chronic diseases classified elsewhere: Secondary | ICD-10-CM | POA: Diagnosis not present

## 2019-09-18 DIAGNOSIS — I480 Paroxysmal atrial fibrillation: Secondary | ICD-10-CM | POA: Diagnosis not present

## 2019-09-18 DIAGNOSIS — Z0000 Encounter for general adult medical examination without abnormal findings: Secondary | ICD-10-CM | POA: Diagnosis not present

## 2019-09-18 DIAGNOSIS — I10 Essential (primary) hypertension: Secondary | ICD-10-CM | POA: Diagnosis not present

## 2019-09-18 DIAGNOSIS — F17210 Nicotine dependence, cigarettes, uncomplicated: Secondary | ICD-10-CM | POA: Diagnosis not present

## 2019-09-18 DIAGNOSIS — N1832 Chronic kidney disease, stage 3b: Secondary | ICD-10-CM | POA: Diagnosis not present

## 2019-09-18 DIAGNOSIS — E039 Hypothyroidism, unspecified: Secondary | ICD-10-CM | POA: Diagnosis not present

## 2019-09-25 DIAGNOSIS — E039 Hypothyroidism, unspecified: Secondary | ICD-10-CM | POA: Diagnosis not present

## 2019-11-01 ENCOUNTER — Other Ambulatory Visit: Payer: Self-pay

## 2019-11-01 ENCOUNTER — Ambulatory Visit (INDEPENDENT_AMBULATORY_CARE_PROVIDER_SITE_OTHER): Payer: Medicare Other | Admitting: Podiatry

## 2019-11-01 ENCOUNTER — Encounter: Payer: Self-pay | Admitting: Podiatry

## 2019-11-01 VITALS — Temp 97.6°F

## 2019-11-01 DIAGNOSIS — D689 Coagulation defect, unspecified: Secondary | ICD-10-CM | POA: Diagnosis not present

## 2019-11-01 DIAGNOSIS — L84 Corns and callosities: Secondary | ICD-10-CM

## 2019-11-01 DIAGNOSIS — B351 Tinea unguium: Secondary | ICD-10-CM

## 2019-11-01 DIAGNOSIS — M79674 Pain in right toe(s): Secondary | ICD-10-CM

## 2019-11-01 DIAGNOSIS — M79675 Pain in left toe(s): Secondary | ICD-10-CM

## 2019-11-04 NOTE — Progress Notes (Signed)
Subjective:   Patient ID: Ariel Cobb, female   DOB: 74 y.o.   MRN: [id number]   HPI Patient presents with thick yellow elongated nailbeds 1-5 both feet and lesions underneath the right and left fifth metatarsal that are painful and long-term usage of anticoagulant therapy and cannot take care of them herself   ROS      Objective:  Physical Exam  Neurovascular status unchanged with thick yellow brittle nailbeds 1-5 both feet that are incurvated in the corner and lesion bilateral that is painful when palpated     Assessment:  Chronic mycotic nail infection with pain 1-5 both feet and lesion formation bilateral with patient on blood thinner     Plan:  Debridement accomplished today 1-5 both feet and lesion bilateral with no iatrogenic bleeding noted

## 2019-11-19 DIAGNOSIS — E039 Hypothyroidism, unspecified: Secondary | ICD-10-CM | POA: Diagnosis not present

## 2020-01-14 ENCOUNTER — Encounter: Payer: Self-pay | Admitting: Cardiovascular Disease

## 2020-01-14 ENCOUNTER — Other Ambulatory Visit: Payer: Self-pay

## 2020-01-14 ENCOUNTER — Ambulatory Visit (INDEPENDENT_AMBULATORY_CARE_PROVIDER_SITE_OTHER): Payer: Medicare Other | Admitting: Cardiovascular Disease

## 2020-01-14 VITALS — BP 152/60 | HR 51 | Ht 65.0 in | Wt 117.0 lb

## 2020-01-14 DIAGNOSIS — I10 Essential (primary) hypertension: Secondary | ICD-10-CM

## 2020-01-14 DIAGNOSIS — Z720 Tobacco use: Secondary | ICD-10-CM

## 2020-01-14 DIAGNOSIS — E782 Mixed hyperlipidemia: Secondary | ICD-10-CM

## 2020-01-14 DIAGNOSIS — I739 Peripheral vascular disease, unspecified: Secondary | ICD-10-CM

## 2020-01-14 DIAGNOSIS — I480 Paroxysmal atrial fibrillation: Secondary | ICD-10-CM | POA: Diagnosis not present

## 2020-01-14 MED ORDER — METOPROLOL SUCCINATE ER 25 MG PO TB24: 25.0000 mg | ORAL_TABLET | Freq: Every day | ORAL | 3 refills | Status: DC

## 2020-01-14 NOTE — Assessment & Plan Note (Signed)
History of PAD status post right popliteal orbital atherectomy, PTA for 9 9% calcified above-the-knee popliteal artery stenosis 07/29/2013.  Her last Doppler studies performed 01/17/2019 revealed a right ABI of 0.99 with a widely patent popliteal artery.  She denies claudication.

## 2020-01-14 NOTE — Assessment & Plan Note (Signed)
History of essential hypertension blood pressure measured today 152/60.  She is on hydrochlorothiazide and metoprolol.

## 2020-01-14 NOTE — Progress Notes (Signed)
01/14/2020 Ariel Cobb   Jan 03, 1946  [id number]  Primary Physician Seward Carol, MD Primary Cardiologist: Lorretta Harp MD FACP, [place], [place], [place]  HPI:  Ariel Cobb is a 74 y.o.  thin appearing widowed Caucasian female no children and who lives alone. I last saw her in the office  04/09/2019. She is accompanied by one of her sisters today.She was referred by Dr. Ila Mcgill at [facility] for evaluation and treatment of claudication. Her primary care physician is Dr. Alta Corning at [facility]. Her cardiovascular risk factors include a 50-pack-year history tobacco abuse currently smoking one pack per day, 2 hypertension, diabetes and hyperlipidemia. Her father died suddenly of microinfarction at age 80. She has never had a heart attack or stroke patient is compared to sleep denies chest pain. She complains of right calf claudication with recent arterial Dopplers performed in our office on 04/25/13 revealing a right ABI 0.93 with a high-frequency signal in the right popliteal artery. She underwent angiography on 12/9 revealing a high-grade right popliteal artery stenosis with three-vessel runoff. Because of moderate renal deficiency her intervention was staged until today.I performed angiography and intervention on her 2 /2/15. She had a 99% calcified lesion in the right popliteal artery but I performed diamondback orbital rotational atherectomy on. Her symptoms of claudication resolved after her procedure and her Dopplers normalized. Her most recent Dopplers performed 02/05/14 revealed ABIs of 1 bilaterally with a widely patent right popliteal artery.  Since I saw her in the office almost 2 years ago she is done well. She still denies claudication. Her recent lower extremity arterial Doppler studies performed in July of last year revealed normal ABIs with moderate increased velocities in the distal right SFA. She was complaining of some tachypalpitations and had a monitor  performed in our office 09/20/2018 that showed episodes of PSVT, nonsustained ventricular tachycardia and PAF. She did have blood work performed by her PCP that showed a TSH 2.03 suggesting that she was hyperthyroid. Her thyroid replacement therapy was down titrated.Since that time she no longer is experiencing palpitations.  When I saw her 3 months ago she was in A. fib with a ventricular spots of 78. We talked about starting apixaban. It turns out that she was hyperthyroid and her Synthroid dose was adjusted. She was also complaining of jaw and chest pain and has since had a negative Myoview and 2D echo.  She wore  a monitor which resulted on 04/15/2019 which showed episodes of PAF with RVR, SVT and nonsustained ventricular tachycardia.  Based on this, I decided  to put her back on her Eliquis oral anticoagulation, I discontinue her aspirin..  She is on Synthroid replacement and is followed by Dr. Buddy Duty, her endocrinologist.  Since I saw her back in October 2020 she continues to do well.  She did have a normal Myoview and 2D echo which was done because of atypical chest pain which she has not not had recurrence of.  She denies claudication.  She does continue to smoke 1/2 pack/day recalcitrant to respect modification.   Current Meds  Medication Sig  . albuterol (PROVENTIL HFA;VENTOLIN HFA) 108 (90 BASE) MCG/ACT inhaler Inhale into the lungs every 6 (six) hours as needed for wheezing or shortness of breath.  [name] apixaban (ELIQUIS) 2.5 MG TABS tablet Take 1 tablet (2.5 mg total) by mouth 2 (two) times daily. On hold  . atorvastatin (LIPITOR) 80 MG tablet Take 1 tablet (80 mg total) by mouth every morning.  [name]  budesonide-formoterol (SYMBICORT) 160-4.5 MCG/ACT inhaler Inhale 2 puffs into the lungs 2 (two) times daily.  . Calcium Carb-Cholecalciferol (CALCIUM 600/VITAMIN D3) 600-800 MG-UNIT TABS Take 1 tablet by mouth daily.   . clobetasol (TEMOVATE) 0.05 % external solution APPLY TO AFFECTED AREA  UP TO TWICE A DAY AS NEEDED (NOT TO FACE, GROIN, OR AXILLA)  . ferrous sulfate 325 (65 FE) MG tablet Take 325 mg by mouth every other day.  . hydrochlorothiazide (HYDRODIURIL) 25 MG tablet Take 25 mg by mouth daily.  . Insulin Glargine (LANTUS SOLOSTAR) 100 UNIT/ML Solostar Pen Inject 8 Units into the skin daily at 2 PM.   . levothyroxine (SYNTHROID) 75 MCG tablet   . NOVOLOG FLEXPEN 100 UNIT/ML FlexPen Inject 4 Units into the skin 3 (three) times daily. Inject 4 units in the morning, 3 units at lunch. (Sliding scale)  . ONE TOUCH ULTRA TEST test strip TEST THREE TIMES DAILY E11.22  . ramipril (ALTACE) 10 MG capsule Take 20 mg by mouth 2 (two) times daily.   . sodium chloride (OCEAN) 0.65 % SOLN nasal spray Place 1 spray into both nostrils as needed for congestion.  . [DISCONTINUED] metoprolol succinate (TOPROL XL) 25 MG 24 hr tablet Take 1 tablet (25 mg total) by mouth daily.     Allergies  Allergen Reactions  . Adhesive [Tape] Itching    Surgical Tape - "makes me itch and break me out" -- HAS TO USE PAPER TAPE  . Amoxicillin Hives, Itching and Swelling  . Latex Itching    Social History   Socioeconomic History  . Marital status: Widowed    Spouse name: Not on file  . Number of children: Not on file  . Years of education: Not on file  . Highest education level: Not on file  Occupational History  . Not on file  Tobacco Use  . Smoking status: Current Every Day Smoker    Packs/day: 1.00    Years: 50.00    Pack years: 50.00    Types: Cigarettes  . Smokeless tobacco: Never Used  Substance and Sexual Activity  . Alcohol use: No  . Drug use: No  . Sexual activity: Never  Other Topics Concern  . Not on file  Social History Narrative  . Not on file   Social Determinants of Health   Financial Resource Strain:   . Difficulty of Paying Living Expenses:   Food Insecurity:   . Worried About Charity fundraiser in the Last Year:   . Arboriculturist in the Last Year:     Transportation Needs:   . Film/video editor (Medical):   [name] Lack of Transportation (Non-Medical):   Physical Activity:   . Days of Exercise per Week:   . Minutes of Exercise per Session:   Stress:   . Feeling of Stress :   Social Connections:   . Frequency of Communication with Friends and Family:   . Frequency of Social Gatherings with Friends and Family:   . Attends Religious Services:   . Active Member of Clubs or Organizations:   . Attends Archivist Meetings:   [name] Marital Status:   Intimate Partner Violence:   . Fear of Current or Ex-Partner:   . Emotionally Abused:   [name] Physically Abused:   . Sexually Abused:      Review of Systems: General: negative for chills, fever, night sweats or weight changes.  Cardiovascular: negative for chest pain, dyspnea on exertion, edema, orthopnea, palpitations, paroxysmal nocturnal  dyspnea or shortness of breath Dermatological: negative for rash Respiratory: negative for cough or wheezing Urologic: negative for hematuria Abdominal: negative for nausea, vomiting, diarrhea, bright red blood per rectum, melena, or hematemesis Neurologic: negative for visual changes, syncope, or dizziness All other systems reviewed and are otherwise negative except as noted above.    Blood pressure (!) 152/60, pulse (!) 51, height 5\' 5"  (1.651 m), weight 117 lb (53.1 kg).  General appearance: alert and no distress Neck: no adenopathy, no carotid bruit, no JVD, supple, symmetrical, trachea midline and thyroid not enlarged, symmetric, no tenderness/mass/nodules Lungs: clear to auscultation bilaterally Heart: regular rate and rhythm, S1, S2 normal, no murmur, click, rub or gallop Extremities: extremities normal, atraumatic, no cyanosis or edema Pulses: 2+ and symmetric Skin: Skin color, texture, turgor normal. No rashes or lesions Neurologic: Alert and oriented X 3, normal strength and tone. Normal symmetric reflexes. Normal coordination and  gait  EKG low atrial rhythm at 51 with an incomplete right bundle branch block.  I personally reviewed this EKG.  ASSESSMENT AND PLAN:   PAD (peripheral artery disease) (HCC) History of PAD status post right popliteal orbital atherectomy, PTA for 9 9% calcified above-the-knee popliteal artery stenosis 07/29/2013.  Her last Doppler studies performed 01/17/2019 revealed a right ABI of 0.99 with a widely patent popliteal artery.  She denies claudication.  Essential hypertension History of essential hypertension blood pressure measured today 152/60.  She is on hydrochlorothiazide and metoprolol.  Hyperlipidemia History of hyperlipidemia on statin therapy with lipid profile performed 09/18/2019 revealing total cholesterol 150, LDL of 82 and HDL 53.  Tobacco abuse History of ongoing tobacco abuse of 1/2 pack/day recalcitrant to respect modification.  Paroxysmal atrial fibrillation (HCC) History of PAF and a low atrial regular rhythm today on Eliquis oral anticoagulation.      Lorretta Harp MD FACP,FACC,FAHA, [facility] 01/14/2020 9:06 AM

## 2020-01-14 NOTE — Assessment & Plan Note (Signed)
History of PAF and a low atrial regular rhythm today on Eliquis oral anticoagulation.

## 2020-01-14 NOTE — Assessment & Plan Note (Signed)
History of ongoing tobacco abuse of 1/2 pack/day recalcitrant to respect modification.

## 2020-01-14 NOTE — Assessment & Plan Note (Signed)
History of hyperlipidemia on statin therapy with lipid profile performed 09/18/2019 revealing total cholesterol 150, LDL of 82 and HDL 53.

## 2020-01-14 NOTE — Patient Instructions (Signed)
Medication Instructions:  NO CHANGE *If you need a refill on your cardiac medications before your next appointment, please call your pharmacy*   Lab Work: If you have labs (blood work) drawn today and your tests are completely normal, you will receive your results only by: . MyChart Message (if you have MyChart) OR . A paper copy in the mail If you have any lab test that is abnormal or we need to change your treatment, we will call you to review the results.   Follow-Up: At [facility], you and your health needs are our priority.  As part of our continuing mission to provide you with exceptional heart care, we have created designated Provider Care Teams.  These Care Teams include your primary Cardiologist (physician) and Advanced Practice Providers (APPs -  Physician Assistants and Nurse Practitioners) who all work together to provide you with the care you need, when you need it.  We recommend signing up for the patient portal called "MyChart".  Sign up information is provided on this After Visit Summary.  MyChart is used to connect with patients for Virtual Visits (Telemedicine).  Patients are able to view lab/test results, encounter notes, upcoming appointments, etc.  Non-urgent messages can be sent to your provider as well.   To learn more about what you can do with MyChart, go to [email or web address].    Your next appointment:   12 month(s)  The format for your next appointment:   In Person  Provider:   You may see Jonathan Berry, MD or one of the following Advanced Practice Providers on your designated Care Team:    Luke Kilroy, PA-C  Callie Goodrich, PA-C  Jesse Cleaver, FNP     

## 2020-01-17 DIAGNOSIS — N183 Chronic kidney disease, stage 3 unspecified: Secondary | ICD-10-CM | POA: Diagnosis not present

## 2020-01-17 DIAGNOSIS — F325 Major depressive disorder, single episode, in full remission: Secondary | ICD-10-CM | POA: Diagnosis not present

## 2020-01-17 DIAGNOSIS — E7800 Pure hypercholesterolemia, unspecified: Secondary | ICD-10-CM | POA: Diagnosis not present

## 2020-01-17 DIAGNOSIS — E1122 Type 2 diabetes mellitus with diabetic chronic kidney disease: Secondary | ICD-10-CM | POA: Diagnosis not present

## 2020-01-17 DIAGNOSIS — I10 Essential (primary) hypertension: Secondary | ICD-10-CM | POA: Diagnosis not present

## 2020-01-17 DIAGNOSIS — E1121 Type 2 diabetes mellitus with diabetic nephropathy: Secondary | ICD-10-CM | POA: Diagnosis not present

## 2020-01-17 DIAGNOSIS — E1151 Type 2 diabetes mellitus with diabetic peripheral angiopathy without gangrene: Secondary | ICD-10-CM | POA: Diagnosis not present

## 2020-01-17 DIAGNOSIS — E139 Other specified diabetes mellitus without complications: Secondary | ICD-10-CM | POA: Diagnosis not present

## 2020-01-17 DIAGNOSIS — D649 Anemia, unspecified: Secondary | ICD-10-CM | POA: Diagnosis not present

## 2020-01-17 DIAGNOSIS — E782 Mixed hyperlipidemia: Secondary | ICD-10-CM | POA: Diagnosis not present

## 2020-01-17 DIAGNOSIS — E039 Hypothyroidism, unspecified: Secondary | ICD-10-CM | POA: Diagnosis not present

## 2020-01-22 ENCOUNTER — Other Ambulatory Visit: Payer: Self-pay

## 2020-01-22 ENCOUNTER — Ambulatory Visit (HOSPITAL_COMMUNITY)
Admission: RE | Admit: 2020-01-22 | Discharge: 2020-01-22 | Disposition: A | Discharge status: Home / Self Care | Payer: Medicare Other | Source: Ambulatory Visit | Attending: Cardiology | Admitting: Cardiology

## 2020-01-22 ENCOUNTER — Telehealth: Payer: Self-pay

## 2020-01-22 ENCOUNTER — Other Ambulatory Visit (HOSPITAL_COMMUNITY): Payer: Self-pay | Admitting: Cardiovascular Disease

## 2020-01-22 DIAGNOSIS — I739 Peripheral vascular disease, unspecified: Secondary | ICD-10-CM

## 2020-01-22 DIAGNOSIS — Z9862 Peripheral vascular angioplasty status: Secondary | ICD-10-CM

## 2020-01-22 NOTE — Telephone Encounter (Signed)
Pt report to office requesting samples for Eliquis 2.5 mg. Samples provided to pt.  Qty: 2 boxes Lot # [id number] Exp: 7/22

## 2020-01-22 NOTE — Progress Notes (Signed)
vas 

## 2020-01-31 ENCOUNTER — Ambulatory Visit: Payer: Medicare Other | Admitting: Podiatry

## 2020-02-20 ENCOUNTER — Ambulatory Visit (INDEPENDENT_AMBULATORY_CARE_PROVIDER_SITE_OTHER): Payer: Medicare Other | Admitting: Podiatry

## 2020-02-20 ENCOUNTER — Other Ambulatory Visit: Payer: Self-pay

## 2020-02-20 ENCOUNTER — Encounter: Payer: Self-pay | Admitting: Podiatry

## 2020-02-20 DIAGNOSIS — M79674 Pain in right toe(s): Secondary | ICD-10-CM

## 2020-02-20 DIAGNOSIS — L84 Corns and callosities: Secondary | ICD-10-CM

## 2020-02-20 DIAGNOSIS — B351 Tinea unguium: Secondary | ICD-10-CM | POA: Diagnosis not present

## 2020-02-20 DIAGNOSIS — D689 Coagulation defect, unspecified: Secondary | ICD-10-CM

## 2020-02-20 DIAGNOSIS — M79675 Pain in left toe(s): Secondary | ICD-10-CM | POA: Diagnosis not present

## 2020-02-20 NOTE — Progress Notes (Signed)
Subjective:   Patient ID: Ariel Cobb, female   DOB: 74 y.o.   MRN: [id number]   HPI Patient presents with thick toenails 1-5 both feet that she cannot cut and lesion of the heel bilateral with history of being on blood thinner   ROS      Objective:  Physical Exam  Neurovascular status unchanged with thick yellow brittle nailbeds that are painful 1-5 both feet and lesions plantar heel region bilateral keratotic and mildly painful     Assessment:  Mycotic nail infection with pain 1-5 both feet along with lesion formation with at risk condition concerning diabetes and being on blood thinner     Plan:  Careful debridement of nailbeds accomplished 1-5 both feet no iatrogenic bleeding and lesions bilateral no iatrogenic bleeding and advised this patient on continued routine care

## 2020-03-13 DIAGNOSIS — Z23 Encounter for immunization: Secondary | ICD-10-CM | POA: Diagnosis not present

## 2020-03-18 DIAGNOSIS — J449 Chronic obstructive pulmonary disease, unspecified: Secondary | ICD-10-CM | POA: Diagnosis not present

## 2020-03-18 DIAGNOSIS — Z794 Long term (current) use of insulin: Secondary | ICD-10-CM | POA: Diagnosis not present

## 2020-03-18 DIAGNOSIS — E039 Hypothyroidism, unspecified: Secondary | ICD-10-CM | POA: Diagnosis not present

## 2020-03-18 DIAGNOSIS — I480 Paroxysmal atrial fibrillation: Secondary | ICD-10-CM | POA: Diagnosis not present

## 2020-03-18 DIAGNOSIS — I739 Peripheral vascular disease, unspecified: Secondary | ICD-10-CM | POA: Diagnosis not present

## 2020-03-18 DIAGNOSIS — I700 Atherosclerosis of aorta: Secondary | ICD-10-CM | POA: Diagnosis not present

## 2020-03-18 DIAGNOSIS — E119 Type 2 diabetes mellitus without complications: Secondary | ICD-10-CM | POA: Diagnosis not present

## 2020-03-18 DIAGNOSIS — F17210 Nicotine dependence, cigarettes, uncomplicated: Secondary | ICD-10-CM | POA: Diagnosis not present

## 2020-03-18 DIAGNOSIS — I10 Essential (primary) hypertension: Secondary | ICD-10-CM | POA: Diagnosis not present

## 2020-03-18 DIAGNOSIS — E103293 Type 1 diabetes mellitus with mild nonproliferative diabetic retinopathy without macular edema, bilateral: Secondary | ICD-10-CM | POA: Diagnosis not present

## 2020-04-08 DIAGNOSIS — Z23 Encounter for immunization: Secondary | ICD-10-CM | POA: Diagnosis not present

## 2020-04-21 DIAGNOSIS — E119 Type 2 diabetes mellitus without complications: Secondary | ICD-10-CM | POA: Diagnosis not present

## 2020-04-23 DIAGNOSIS — E7800 Pure hypercholesterolemia, unspecified: Secondary | ICD-10-CM | POA: Diagnosis not present

## 2020-04-23 DIAGNOSIS — E039 Hypothyroidism, unspecified: Secondary | ICD-10-CM | POA: Diagnosis not present

## 2020-04-23 DIAGNOSIS — I480 Paroxysmal atrial fibrillation: Secondary | ICD-10-CM | POA: Diagnosis not present

## 2020-04-23 DIAGNOSIS — J45909 Unspecified asthma, uncomplicated: Secondary | ICD-10-CM | POA: Diagnosis not present

## 2020-04-23 DIAGNOSIS — N183 Chronic kidney disease, stage 3 unspecified: Secondary | ICD-10-CM | POA: Diagnosis not present

## 2020-04-23 DIAGNOSIS — D509 Iron deficiency anemia, unspecified: Secondary | ICD-10-CM | POA: Diagnosis not present

## 2020-04-23 DIAGNOSIS — I10 Essential (primary) hypertension: Secondary | ICD-10-CM | POA: Diagnosis not present

## 2020-04-23 DIAGNOSIS — E782 Mixed hyperlipidemia: Secondary | ICD-10-CM | POA: Diagnosis not present

## 2020-04-23 DIAGNOSIS — J449 Chronic obstructive pulmonary disease, unspecified: Secondary | ICD-10-CM | POA: Diagnosis not present

## 2020-04-23 DIAGNOSIS — E1122 Type 2 diabetes mellitus with diabetic chronic kidney disease: Secondary | ICD-10-CM | POA: Diagnosis not present

## 2020-04-29 DIAGNOSIS — E039 Hypothyroidism, unspecified: Secondary | ICD-10-CM | POA: Diagnosis not present

## 2020-04-30 DIAGNOSIS — N183 Chronic kidney disease, stage 3 unspecified: Secondary | ICD-10-CM | POA: Diagnosis not present

## 2020-04-30 DIAGNOSIS — E1122 Type 2 diabetes mellitus with diabetic chronic kidney disease: Secondary | ICD-10-CM | POA: Diagnosis not present

## 2020-04-30 DIAGNOSIS — I129 Hypertensive chronic kidney disease with stage 1 through stage 4 chronic kidney disease, or unspecified chronic kidney disease: Secondary | ICD-10-CM | POA: Diagnosis not present

## 2020-06-11 DIAGNOSIS — E1165 Type 2 diabetes mellitus with hyperglycemia: Secondary | ICD-10-CM | POA: Diagnosis not present

## 2020-06-11 DIAGNOSIS — Z794 Long term (current) use of insulin: Secondary | ICD-10-CM | POA: Diagnosis not present

## 2020-06-11 DIAGNOSIS — E1022 Type 1 diabetes mellitus with diabetic chronic kidney disease: Secondary | ICD-10-CM | POA: Diagnosis not present

## 2020-06-11 DIAGNOSIS — N1832 Chronic kidney disease, stage 3b: Secondary | ICD-10-CM | POA: Diagnosis not present

## 2020-06-11 DIAGNOSIS — Z720 Tobacco use: Secondary | ICD-10-CM | POA: Diagnosis not present

## 2020-06-16 ENCOUNTER — Telehealth: Payer: Self-pay | Admitting: Cardiovascular Disease

## 2020-06-16 NOTE — Telephone Encounter (Signed)
    Pt is calling about meds refill, she said she will call pharmacy when its time for her refill

## 2020-06-24 ENCOUNTER — Ambulatory Visit (INDEPENDENT_AMBULATORY_CARE_PROVIDER_SITE_OTHER): Payer: Medicare Other

## 2020-06-24 ENCOUNTER — Other Ambulatory Visit: Payer: Self-pay | Admitting: Podiatry

## 2020-06-24 ENCOUNTER — Encounter: Payer: Self-pay | Admitting: Podiatry

## 2020-06-24 ENCOUNTER — Ambulatory Visit (INDEPENDENT_AMBULATORY_CARE_PROVIDER_SITE_OTHER): Payer: Medicare Other | Admitting: Podiatry

## 2020-06-24 ENCOUNTER — Other Ambulatory Visit: Payer: Self-pay

## 2020-06-24 DIAGNOSIS — D689 Coagulation defect, unspecified: Secondary | ICD-10-CM

## 2020-06-24 DIAGNOSIS — L84 Corns and callosities: Secondary | ICD-10-CM

## 2020-06-24 DIAGNOSIS — M25572 Pain in left ankle and joints of left foot: Secondary | ICD-10-CM

## 2020-06-24 DIAGNOSIS — S9032XA Contusion of left foot, initial encounter: Secondary | ICD-10-CM

## 2020-06-24 DIAGNOSIS — M79674 Pain in right toe(s): Secondary | ICD-10-CM | POA: Diagnosis not present

## 2020-06-24 DIAGNOSIS — M79675 Pain in left toe(s): Secondary | ICD-10-CM

## 2020-06-24 DIAGNOSIS — B351 Tinea unguium: Secondary | ICD-10-CM

## 2020-06-24 NOTE — Progress Notes (Signed)
Subjective:   Patient ID: Ariel Cobb, female   DOB: 74 y.o.   MRN: [id number]   HPI Patient presents stating her nails are thickened and bothering her and lesions on the bottom of both heels are bothering her at this time.  Patient is on blood thinner.  Patient is also concerned about pain in the left ankle whether she has arthritis or could have done something to it   ROS      Objective:  Physical Exam  Neurovascular status unchanged with thick incurvated nailbeds 1-5 both feet painful lesions plantar heel bilateral with at risk blood thinner.  Pain in the left ankle and into the left midfoot     Assessment:  Chronic mycotic nail infection bilateral along with lesion formation bilateral.  Possibility for arthritis or fracture left ankle     Plan:  H&P went ahead today debrided nailbeds debrided lesions and reappoint for routine care with no iatrogenic bleeding except for fourth toe left distal nailbed which I applied Band-Aid to.  Patient will be seen back to recheck.  Do not recommend treatment for ankle it will be watched  X-rays indicate no signs of arthritis of a significant nature left ankle with no signs of advanced pathology

## 2020-07-20 DIAGNOSIS — E1022 Type 1 diabetes mellitus with diabetic chronic kidney disease: Secondary | ICD-10-CM | POA: Diagnosis not present

## 2020-07-20 DIAGNOSIS — I480 Paroxysmal atrial fibrillation: Secondary | ICD-10-CM | POA: Diagnosis not present

## 2020-07-20 DIAGNOSIS — N1832 Chronic kidney disease, stage 3b: Secondary | ICD-10-CM | POA: Diagnosis not present

## 2020-07-20 DIAGNOSIS — E782 Mixed hyperlipidemia: Secondary | ICD-10-CM | POA: Diagnosis not present

## 2020-07-20 DIAGNOSIS — J449 Chronic obstructive pulmonary disease, unspecified: Secondary | ICD-10-CM | POA: Diagnosis not present

## 2020-07-20 DIAGNOSIS — R69 Illness, unspecified: Secondary | ICD-10-CM | POA: Diagnosis not present

## 2020-07-20 DIAGNOSIS — I10 Essential (primary) hypertension: Secondary | ICD-10-CM | POA: Diagnosis not present

## 2020-07-20 DIAGNOSIS — D509 Iron deficiency anemia, unspecified: Secondary | ICD-10-CM | POA: Diagnosis not present

## 2020-07-20 DIAGNOSIS — E7800 Pure hypercholesterolemia, unspecified: Secondary | ICD-10-CM | POA: Diagnosis not present

## 2020-07-20 DIAGNOSIS — E039 Hypothyroidism, unspecified: Secondary | ICD-10-CM | POA: Diagnosis not present

## 2020-08-04 ENCOUNTER — Other Ambulatory Visit: Payer: Self-pay | Admitting: Cardiovascular Disease

## 2020-08-04 NOTE — Telephone Encounter (Signed)
Refill request received for eliquis 2.5mg  but pt qualifies for 5mg  based on dosing guidelines. 68f, 53.1kg, Creatinine, Serum 1.420 MG/ 04/21/2020. Will route to pharmd pool for review.

## 2020-08-06 ENCOUNTER — Telehealth: Payer: Self-pay | Admitting: Cardiovascular Disease

## 2020-08-06 DIAGNOSIS — I480 Paroxysmal atrial fibrillation: Secondary | ICD-10-CM

## 2020-08-06 MED ORDER — APIXABAN 2.5 MG PO TABS: 2.5000 mg | ORAL_TABLET | Freq: Two times a day (BID) | ORAL | 0 refills | Status: DC

## 2020-08-06 MED ORDER — ATORVASTATIN CALCIUM 80 MG PO TABS: 80.0000 mg | ORAL_TABLET | ORAL | 1 refills | Status: DC

## 2020-08-06 NOTE — Telephone Encounter (Signed)
Pt c/o medication issue:  1. Name of Medication: apixaban (ELIQUIS) 2.5 MG TABS tablet; atorvastatin (LIPITOR) 80 MG tablet  2. How are you currently taking this medication (dosage and times per day)? As written  3. Are you having a reaction (difficulty breathing--STAT)? No   4. What is your medication issue? Patient is currently out of refills on both medication. Needs new prescription asap sent to [id number] - , [place]

## 2020-08-06 NOTE — Telephone Encounter (Signed)
Refilled Atorvastatin. Will send to coumadin clinic for refills on eliquis.

## 2020-08-06 NOTE — Telephone Encounter (Signed)
72f, 53.1 kg, Creatinine, Serum 1.420 MG/ 04/21/2020, lovw/berry 01/14/20. Pt requesting refill of eliquis 2.5mg  but based on dosing guidelines she qualifies for the 5mg . Will route to pharmd

## 2020-08-06 NOTE — Telephone Encounter (Signed)
Her scr has been pretty consistently >1.5 for awhile. I would get repeat scr prior to changing dose to confirm it is still <1.5

## 2020-08-06 NOTE — Telephone Encounter (Signed)
Called and spoke w/pt regarding her being borderline for a dose change and needed metabolic panel. Ordered and instructed pt to come in no appt needed. Also sent 1 month refill foe eliquis 2.5mg 

## 2020-08-06 NOTE — Telephone Encounter (Signed)
Age 75, weight 53kg. SCr 1.42 on 04/21/20, however 4 SCr previously checked in 2020-2021 were consistently > 1.5. Will refill at 2.5mg  BID dosing for now, if SCr stays < 1.5 on next check, can consider dose increase at that time.

## 2020-08-07 ENCOUNTER — Other Ambulatory Visit: Payer: Self-pay

## 2020-08-07 ENCOUNTER — Telehealth: Payer: Self-pay

## 2020-08-07 DIAGNOSIS — I480 Paroxysmal atrial fibrillation: Secondary | ICD-10-CM

## 2020-08-07 LAB — COMPREHENSIVE METABOLIC PANEL
ALT: 7 IU/L (ref 0–32)
AST: 14 IU/L (ref 0–40)
Albumin/Globulin Ratio: 1.2 (ref 1.2–2.2)
Albumin: 3.7 g/dL (ref 3.7–4.7)
Alkaline Phosphatase: 78 IU/L (ref 44–121)
BUN/Creatinine Ratio: 32 — ABNORMAL HIGH (ref 12–28)
BUN: 47 mg/dL — ABNORMAL HIGH (ref 8–27)
Bilirubin Total: 0.4 mg/dL (ref 0.0–1.2)
CO2: 25 mmol/L (ref 20–29)
Calcium: 9.2 mg/dL (ref 8.7–10.3)
Chloride: 101 mmol/L (ref 96–106)
Creatinine, Ser: 1.48 mg/dL — ABNORMAL HIGH (ref 0.57–1.00)
GFR calc Af Amer: 39 mL/min/{1.73_m2} — ABNORMAL LOW (ref >59)
GFR calc non Af Amer: 34 mL/min/{1.73_m2} — ABNORMAL LOW (ref >59)
Globulin, Total: 3.2 g/dL (ref 1.5–4.5)
Glucose: 161 mg/dL — ABNORMAL HIGH (ref 65–99)
Potassium: 3.7 mmol/L (ref 3.5–5.2)
Sodium: 140 mmol/L (ref 134–144)
Total Protein: 6.9 g/dL (ref 6.0–8.5)

## 2020-08-07 MED ORDER — APIXABAN 2.5 MG PO TABS: ORAL_TABLET | ORAL | 1 refills | Status: DC

## 2020-08-07 NOTE — Telephone Encounter (Signed)
Spoke to patient she stated she was suppose to get a 90 day refill on Eliquis.Stated when she picked up prescription she only received 1 months supply 60 tablets.Advised I will resend refill for 180 tablets with 1 refill.

## 2020-08-10 ENCOUNTER — Other Ambulatory Visit: Payer: Self-pay

## 2020-08-10 MED ORDER — APIXABAN 5 MG PO TABS: 5.0000 mg | ORAL_TABLET | Freq: Two times a day (BID) | ORAL | 1 refills | Status: DC

## 2020-08-10 NOTE — Telephone Encounter (Signed)
Called and discussed dose increase and taking what she has just two am two pm until out of 2.5 eliquis. Will reorder 5 mg in a refill encouner as this one will not allow me to change pt voiced understanding and will increase to 5mg 

## 2020-08-10 NOTE — Telephone Encounter (Signed)
Scr <1.5 x 2 labs in a row. Would increase Eliquis dose to 5mg  BID.   Haleigh please let patient know she can take 2 of the 2.5mg  tablets twice a day until she runs out and please send rx for 5mg  BID to pharmacy.

## 2020-09-02 ENCOUNTER — Other Ambulatory Visit: Payer: Self-pay

## 2020-09-02 ENCOUNTER — Ambulatory Visit (INDEPENDENT_AMBULATORY_CARE_PROVIDER_SITE_OTHER): Payer: Medicare HMO | Admitting: Otolaryngology

## 2020-09-02 ENCOUNTER — Encounter (INDEPENDENT_AMBULATORY_CARE_PROVIDER_SITE_OTHER): Payer: Self-pay | Admitting: Otolaryngology

## 2020-09-02 VITALS — Temp 95.5°F

## 2020-09-02 DIAGNOSIS — H6123 Impacted cerumen, bilateral: Secondary | ICD-10-CM

## 2020-09-02 NOTE — Progress Notes (Signed)
HPI: Ariel Cobb is a 75 y.o. female who presents for evaluation of blockage hearing in the left ear.  She has history of wax problems and was last cleaned about 3 years ago..  Past Medical History:  Diagnosis Date  . Anemia    slight told feb dr polite  . Arthritis   . Asthma   . Chronic bronchitis ([place])   . Chronic kidney disease    sees dr sanford yearly stage 3 stable  . Claudication ([place])   . Complication of anesthesia 2000   slow to awaken after hip surgery  . Hyperlipidemia   . Hypertension   . Hypothyroidism   . Osteoporosis   . PAD (peripheral artery disease) ([place])    a. 07/29/13: s/p PV angiogram with successful diamondback orbital rotational atherectomy of the high-grade calcified R popliteal artery stenosis  . Shortness of breath    on exertion  . Thyroid disease   . Type II diabetes mellitus ([place])    Past Surgical History:  Procedure Laterality Date  . ABDOMINAL AORTAGRAM  06/04/2013   Procedure: ABDOMINAL [name];  Surgeon: Lorretta Harp, MD;  Location: [facility] CATH LAB;  Service: Cardiovascular;;  . ATHERECTOMY Right 07/29/2013   popliteal/notes 07/29/2013  . CHOLECYSTECTOMY  1980's?  . COLONOSCOPY WITH PROPOFOL N/A 09/06/2016   Procedure: COLONOSCOPY WITH PROPOFOL;  Surgeon: Garlan Fair, MD;  Location: [facility] ENDOSCOPY;  Service: Endoscopy;  Laterality: N/A;  . cyst from right hand    . ESOPHAGOGASTRODUODENOSCOPY (EGD) WITH PROPOFOL N/A 09/06/2016   Procedure: ESOPHAGOGASTRODUODENOSCOPY (EGD) WITH PROPOFOL;  Surgeon: Garlan Fair, MD;  Location: [facility] ENDOSCOPY;  Service: Endoscopy;  Laterality: N/A;  . HIP FRACTURE SURGERY Left 2000's   "I broke the ball; dr broke femur during OR; got a rod in there" 92/07/2013)  . LOWER EXTREMITY ANGIOGRAM N/A 06/04/2013   Procedure: LOWER EXTREMITY ANGIOGRAM;  Surgeon: Lorretta Harp, MD;  Location: [facility] CATH LAB;  Service: Cardiovascular;  Laterality: N/A;   Social History   Socioeconomic History  . Marital status:  Widowed    Spouse name: Not on file  . Number of children: Not on file  . Years of education: Not on file  . Highest education level: Not on file  Occupational History  . Not on file  Tobacco Use  . Smoking status: Current Every Day Smoker    Packs/day: 1.00    Years: 50.00    Pack years: 50.00    Types: Cigarettes  . Smokeless tobacco: Never Used  Substance and Sexual Activity  . Alcohol use: No  . Drug use: No  . Sexual activity: Never  Other Topics Concern  . Not on file  Social History Narrative  . Not on file   Social Determinants of Health   Financial Resource Strain: Not on file  Food Insecurity: Not on file  Transportation Needs: Not on file  Physical Activity: Not on file  Stress: Not on file  Social Connections: Not on file   Family History  Problem Relation Age of Onset  . Heart disease Father    Allergies  Allergen Reactions  . Adhesive [Tape] Itching    Surgical Tape - "makes me itch and break me out" -- HAS TO USE PAPER TAPE  . Amoxicillin Hives, Itching and Swelling  . Latex Itching  . Other Other (See Comments)  . Pioglitazone Other (See Comments)   Prior to Admission medications   Medication Sig Start Date End Date Taking? Authorizing Provider  albuterol (  PROVENTIL HFA;VENTOLIN HFA) 108 (90 BASE) MCG/ACT inhaler Inhale into the lungs every 6 (six) hours as needed for wheezing or shortness of breath.    [provider]  apixaban (ELIQUIS) 5 MG TABS tablet Take 1 tablet (5 mg total) by mouth 2 (two) times daily. 08/10/20   Marcelle Overlie D, RPH-CPP  atorvastatin (LIPITOR) 80 MG tablet Take 1 tablet (80 mg total) by mouth every morning. 08/06/20   Lorretta Harp, MD  budesonide-formoterol [facility]) 160-4.5 MCG/ACT inhaler Inhale 2 puffs into the lungs 2 (two) times daily.    [provider]  Calcium Carb-Cholecalciferol (CALCIUM 600/VITAMIN D3) 600-800 MG-UNIT TABS Take 1 tablet by mouth daily.     [provider]   clobetasol (TEMOVATE) 0.05 % external solution APPLY TO AFFECTED AREA UP TO TWICE A DAY AS NEEDED (NOT TO FACE, GROIN, OR AXILLA) 07/31/19   [provider]  ferrous sulfate 325 (65 FE) MG tablet Take 325 mg by mouth every other day.    [provider]  hydrochlorothiazide (HYDRODIURIL) 25 MG tablet Take 25 mg by mouth daily. 12/15/14   [provider]  Insulin Glargine (LANTUS SOLOSTAR) 100 UNIT/ML Solostar Pen Inject 8 Units into the skin daily at 2 PM.     [provider]  levothyroxine (SYNTHROID) 75 MCG tablet  11/05/18   [provider]  metoprolol succinate (TOPROL XL) 25 MG 24 hr tablet Take 1 tablet (25 mg total) by mouth daily. 01/14/20   Lorretta Harp, MD  NOVOLOG FLEXPEN 100 UNIT/ML FlexPen Inject 4 Units into the skin 3 (three) times daily. Inject 4 units in the morning, 3 units at lunch. (Sliding scale) 12/23/14   [provider]  ONE TOUCH ULTRA TEST test strip TEST THREE TIMES DAILY E11.22 10/06/17   [provider]  ramipril (ALTACE) 10 MG capsule Take 20 mg by mouth 2 (two) times daily.  01/30/13   [provider]  sodium chloride (OCEAN) 0.65 % SOLN nasal spray Place 1 spray into both nostrils as needed for congestion.    [provider]     Positive ROS: Otherwise negative  All other systems have been reviewed and were otherwise negative with the exception of those mentioned in the HPI and as above.  Physical Exam: Constitutional: Alert, well-appearing, no acute distress Ears: External ears without lesions or tenderness. Ear canals large amount of wax in both ear canals that was cleaned with forceps.  TMs are clear bilaterally although she may have a minimal left serous otitis.  On tuning fork testing AC > BC on the right side AC equivocal to [facility] on the left side however subjectively she had minimal hearing loss in both ears for hearing felt much better after cleaning the ears.. Nasal: External nose  without lesions. Clear nasal passages Oral: Oropharynx clear. Neck: No palpable adenopathy or masses Respiratory: Breathing comfortably  Skin: No facial/neck lesions or rash noted.  Cerumen impaction removal  Date/Time: 09/02/2020 8:35 AM Performed by: Rozetta Nunnery, MD Authorized by: Rozetta Nunnery, MD   Consent:    Consent obtained:  Verbal   Consent given by:  Patient   Risks discussed:  Pain and bleeding Procedure details:    Location:  L ear and R ear   Procedure type: curette and forceps   Post-procedure details:    Inspection:  TM intact and canal normal   Hearing quality:  Improved   Patient tolerance of procedure:  Tolerated well, no immediate complications Comments:  TMs are clear bilaterally.    Assessment: Bilateral cerumen impaction  Plan: This was cleaned in the office. She will follow-up as needed.  Radene Journey, MD

## 2020-09-21 DIAGNOSIS — I700 Atherosclerosis of aorta: Secondary | ICD-10-CM | POA: Diagnosis not present

## 2020-09-21 DIAGNOSIS — I10 Essential (primary) hypertension: Secondary | ICD-10-CM | POA: Diagnosis not present

## 2020-09-21 DIAGNOSIS — N1832 Chronic kidney disease, stage 3b: Secondary | ICD-10-CM | POA: Diagnosis not present

## 2020-09-21 DIAGNOSIS — R69 Illness, unspecified: Secondary | ICD-10-CM | POA: Diagnosis not present

## 2020-09-21 DIAGNOSIS — E039 Hypothyroidism, unspecified: Secondary | ICD-10-CM | POA: Diagnosis not present

## 2020-09-21 DIAGNOSIS — Z0000 Encounter for general adult medical examination without abnormal findings: Secondary | ICD-10-CM | POA: Diagnosis not present

## 2020-09-21 DIAGNOSIS — M810 Age-related osteoporosis without current pathological fracture: Secondary | ICD-10-CM | POA: Diagnosis not present

## 2020-09-21 DIAGNOSIS — E7800 Pure hypercholesterolemia, unspecified: Secondary | ICD-10-CM | POA: Diagnosis not present

## 2020-09-21 DIAGNOSIS — J449 Chronic obstructive pulmonary disease, unspecified: Secondary | ICD-10-CM | POA: Diagnosis not present

## 2020-10-16 ENCOUNTER — Other Ambulatory Visit: Payer: Self-pay | Admitting: Cardiovascular Disease

## 2020-10-16 DIAGNOSIS — I480 Paroxysmal atrial fibrillation: Secondary | ICD-10-CM

## 2020-10-21 ENCOUNTER — Other Ambulatory Visit: Payer: Self-pay

## 2020-10-21 ENCOUNTER — Ambulatory Visit (INDEPENDENT_AMBULATORY_CARE_PROVIDER_SITE_OTHER): Payer: Medicare HMO | Admitting: Podiatry

## 2020-10-21 ENCOUNTER — Encounter: Payer: Self-pay | Admitting: Podiatry

## 2020-10-21 DIAGNOSIS — B351 Tinea unguium: Secondary | ICD-10-CM | POA: Diagnosis not present

## 2020-10-21 DIAGNOSIS — E1122 Type 2 diabetes mellitus with diabetic chronic kidney disease: Secondary | ICD-10-CM | POA: Diagnosis not present

## 2020-10-21 DIAGNOSIS — J449 Chronic obstructive pulmonary disease, unspecified: Secondary | ICD-10-CM | POA: Diagnosis not present

## 2020-10-21 DIAGNOSIS — I4891 Unspecified atrial fibrillation: Secondary | ICD-10-CM | POA: Diagnosis not present

## 2020-10-21 DIAGNOSIS — G8929 Other chronic pain: Secondary | ICD-10-CM | POA: Diagnosis not present

## 2020-10-21 DIAGNOSIS — E785 Hyperlipidemia, unspecified: Secondary | ICD-10-CM | POA: Diagnosis not present

## 2020-10-21 DIAGNOSIS — Z794 Long term (current) use of insulin: Secondary | ICD-10-CM | POA: Diagnosis not present

## 2020-10-21 DIAGNOSIS — D689 Coagulation defect, unspecified: Secondary | ICD-10-CM

## 2020-10-21 DIAGNOSIS — D6869 Other thrombophilia: Secondary | ICD-10-CM | POA: Diagnosis not present

## 2020-10-21 DIAGNOSIS — M79674 Pain in right toe(s): Secondary | ICD-10-CM

## 2020-10-21 DIAGNOSIS — I129 Hypertensive chronic kidney disease with stage 1 through stage 4 chronic kidney disease, or unspecified chronic kidney disease: Secondary | ICD-10-CM | POA: Diagnosis not present

## 2020-10-21 DIAGNOSIS — E039 Hypothyroidism, unspecified: Secondary | ICD-10-CM | POA: Diagnosis not present

## 2020-10-21 DIAGNOSIS — M79675 Pain in left toe(s): Secondary | ICD-10-CM

## 2020-10-21 DIAGNOSIS — L84 Corns and callosities: Secondary | ICD-10-CM | POA: Diagnosis not present

## 2020-10-21 DIAGNOSIS — E1165 Type 2 diabetes mellitus with hyperglycemia: Secondary | ICD-10-CM | POA: Diagnosis not present

## 2020-10-21 NOTE — Progress Notes (Signed)
Subjective:   Patient ID: Ariel Cobb, female   DOB: 75 y.o.   MRN: [id number]   HPI Patient presents stating she is got thick toenail disease 1-5 both feet that can be painful and has lesions on the plantar aspect of the heel region bilateral with patient on blood thinner   ROS      Objective:  Physical Exam  Neurovascular status unchanged with keratotic lesions of the heel region bilateral with patient on blood thinner along with incurvated thickened nailbeds 1-5 both feet sore in the corners     Assessment:  At risk patient with lesion formation chronic bilateral and mycotic nail infection 1-5 both feet     Plan:  H&P reviewed condition and went ahead today debrided lesion small amount of bleeding right applied sterile dressing and debrided nailbeds 1-5 both feet with no iatrogenic bleeding and reappoint routine care

## 2020-11-20 ENCOUNTER — Other Ambulatory Visit: Payer: Self-pay

## 2020-11-20 MED ORDER — ATORVASTATIN CALCIUM 80 MG PO TABS: 80.0000 mg | ORAL_TABLET | ORAL | 3 refills | Status: DC

## 2020-12-10 DIAGNOSIS — E1165 Type 2 diabetes mellitus with hyperglycemia: Secondary | ICD-10-CM | POA: Diagnosis not present

## 2020-12-10 DIAGNOSIS — Z720 Tobacco use: Secondary | ICD-10-CM | POA: Diagnosis not present

## 2020-12-10 DIAGNOSIS — N1832 Chronic kidney disease, stage 3b: Secondary | ICD-10-CM | POA: Diagnosis not present

## 2020-12-10 DIAGNOSIS — Z794 Long term (current) use of insulin: Secondary | ICD-10-CM | POA: Diagnosis not present

## 2020-12-10 DIAGNOSIS — E1022 Type 1 diabetes mellitus with diabetic chronic kidney disease: Secondary | ICD-10-CM | POA: Diagnosis not present

## 2021-01-12 ENCOUNTER — Ambulatory Visit: Payer: Medicare HMO | Admitting: Cardiovascular Disease

## 2021-01-12 ENCOUNTER — Other Ambulatory Visit: Payer: Self-pay

## 2021-01-12 ENCOUNTER — Encounter: Payer: Self-pay | Admitting: Cardiovascular Disease

## 2021-01-12 VITALS — BP 144/50 | HR 71 | Ht 65.0 in | Wt 118.0 lb

## 2021-01-12 DIAGNOSIS — E782 Mixed hyperlipidemia: Secondary | ICD-10-CM | POA: Diagnosis not present

## 2021-01-12 DIAGNOSIS — I10 Essential (primary) hypertension: Secondary | ICD-10-CM | POA: Diagnosis not present

## 2021-01-12 DIAGNOSIS — I480 Paroxysmal atrial fibrillation: Secondary | ICD-10-CM | POA: Diagnosis not present

## 2021-01-12 DIAGNOSIS — Z720 Tobacco use: Secondary | ICD-10-CM | POA: Diagnosis not present

## 2021-01-12 MED ORDER — APIXABAN 5 MG PO TABS: 5.0000 mg | ORAL_TABLET | Freq: Two times a day (BID) | ORAL | 1 refills | Status: DC

## 2021-01-12 NOTE — Assessment & Plan Note (Signed)
History of essential hypertension blood pressure measured today at 144/50.  She is on hydrochlorothiazide ramipril and metoprolol.

## 2021-01-12 NOTE — Assessment & Plan Note (Signed)
History of PAF maintaining sinus rhythm on Eliquis oral anticoagulation.  It was thought that this may have been related to iatrogenic hyperthyroidism.

## 2021-01-12 NOTE — Assessment & Plan Note (Signed)
History of hyperlipidemia on statin therapy with lipid profile performed 09/13/2020 revealing total cholesterol 152, LDL of 84 and HDL 49.

## 2021-01-12 NOTE — Patient Instructions (Signed)
Medication Instructions:  The current medical regimen is effective;  continue present plan and medications.  *If you need a refill on your cardiac medications before your next appointment, please call your pharmacy*   Testing/Procedures: Keep leg scan on 07/28   Follow-Up: At [facility], you and your health needs are our priority.  As part of our continuing mission to provide you with exceptional heart care, we have created designated Provider Care Teams.  These Care Teams include your primary Cardiologist (physician) and Advanced Practice Providers (APPs -  Physician Assistants and Nurse Practitioners) who all work together to provide you with the care you need, when you need it.  We recommend signing up for the patient portal called "MyChart".  Sign up information is provided on this After Visit Summary.  MyChart is used to connect with patients for Virtual Visits (Telemedicine).  Patients are able to view lab/test results, encounter notes, upcoming appointments, etc.  Non-urgent messages can be sent to your provider as well.   To learn more about what you can do with MyChart, go to [email or web address].    Your next appointment:   12 month(s)  The format for your next appointment:   In Person  Provider:   Quay Burow, MD

## 2021-01-12 NOTE — Progress Notes (Signed)
01/12/2021 Ariel Cobb   [birth year]  [id number]  Primary Physician Seward Carol, MD Primary Cardiologist: Lorretta Harp MD FACP, [place], [place], [place]  HPI:  Ariel Cobb is a 75 y.o.   thin appearing widowed Caucasian female no children and who lives alone. I last saw her in the office      01/14/2020. Marland KitchenShe was referred by Dr. Ila Mcgill at [facility] for evaluation and treatment of claudication. Her primary care physician is Dr. Alta Corning at [place]. Her cardiovascular risk factors include a 50-pack-year history tobacco abuse currently smoking one pack per day, 2 hypertension, diabetes and hyperlipidemia. Her father died suddenly of microinfarction at age 67. She has never had a heart attack or stroke patient is compared to sleep denies chest pain. She complains of right calf claudication with recent arterial Dopplers performed in our office on 04/25/13 revealing a right ABI 0.93 with a high-frequency signal in the right popliteal artery. She underwent angiography on 12/9 revealing a high-grade right popliteal artery stenosis with three-vessel runoff. Because of moderate renal deficiency her intervention was staged until today.I performed angiography and intervention on her 2 /2/15. She had a 99% calcified lesion in the right popliteal artery but I performed diamondback orbital rotational atherectomy on. Her symptoms of claudication resolved after her procedure and her Dopplers normalized. Her most recent Dopplers performed 02/05/14 revealed ABIs of 1 bilaterally with a widely patent right popliteal artery.   Since I saw her in the office almost 2 years ago she is done well.  She still denies claudication.  Her recent lower extremity arterial Doppler studies performed in July of last year revealed normal ABIs with moderate increased velocities in the distal right SFA.  She was complaining of some tachypalpitations and had a monitor performed in our office 09/20/2018 that showed  episodes of PSVT, nonsustained ventricular tachycardia and PAF.  She did have blood work performed by her PCP that showed a TSH 2.03 suggesting that she was hyperthyroid.  Her thyroid replacement therapy was down titrated.  Since that time she no longer is experiencing palpitations.   When I saw her 3 months ago she was in A. fib with a ventricular spots of 78.  We talked about starting apixaban.  It turns out that she was hyperthyroid and her Synthroid dose was adjusted.  She was also complaining of jaw and chest pain and has since had a negative Myoview and 2D echo.   She wore  a monitor which resulted on 04/15/2019 which showed episodes of PAF with RVR, SVT and nonsustained ventricular tachycardia.  Based on this, I decided  to put her back on her Eliquis oral anticoagulation, I discontinue her aspirin..  She is on Synthroid replacement and is followed by Dr. Buddy Duty, her endocrinologist.   Since I saw her in the office a year ago she continues to do well.  She still smoking close to a pack a day recalcitrant to factor modification.  She denies chest pain or shortness of breath, or claudication.  Her lower extremity Dopplers performed 1 year ago showed a widely patent right popliteal artery.  She is maintaining sinus rhythm on Eliquis oral anticoagulation.     Current Meds  Medication Sig   albuterol (PROVENTIL HFA;VENTOLIN HFA) 108 (90 BASE) MCG/ACT inhaler Inhale into the lungs every 6 (six) hours as needed for wheezing or shortness of breath.   apixaban (ELIQUIS) 5 MG TABS tablet Take 1 tablet (5 mg total) by  mouth 2 (two) times daily.   apixaban (ELIQUIS) 5 MG TABS tablet 1 tablet   atorvastatin (LIPITOR) 80 MG tablet Take 1 tablet (80 mg total) by mouth every morning.   budesonide-formoterol (SYMBICORT) 160-4.5 MCG/ACT inhaler Inhale 2 puffs into the lungs 2 (two) times daily.   Calcium Carb-Cholecalciferol 600-800 MG-UNIT TABS Take 1 tablet by mouth daily.    clobetasol (TEMOVATE) 0.05 %  external solution APPLY TO AFFECTED AREA UP TO TWICE A DAY AS NEEDED (NOT TO FACE, GROIN, OR AXILLA)   ferrous sulfate 325 (65 FE) MG tablet Take 325 mg by mouth every other day.   hydrochlorothiazide (HYDRODIURIL) 25 MG tablet Take 25 mg by mouth daily.   insulin glargine (LANTUS) 100 UNIT/ML Solostar Pen Inject 8 Units into the skin daily at 2 PM.    levothyroxine (SYNTHROID) 75 MCG tablet    metoprolol succinate (TOPROL-XL) 25 MG 24 hr tablet TAKE 1 TABLET BY MOUTH EVERY DAY   NOVOLOG FLEXPEN 100 UNIT/ML FlexPen Inject 4 Units into the skin 3 (three) times daily. Inject 4 units in the morning, 3 units at lunch. (Sliding scale)   ONE TOUCH ULTRA TEST test strip TEST THREE TIMES DAILY E11.22   ramipril (ALTACE) 10 MG capsule Take 20 mg by mouth 2 (two) times daily.    sodium chloride (OCEAN) 0.65 % SOLN nasal spray Place 1 spray into both nostrils as needed for congestion.     Allergies  Allergen Reactions   Adhesive [Tape] Itching    Surgical Tape - "makes me itch and break me out" -- HAS TO USE PAPER TAPE   Amoxicillin Hives, Itching and Swelling   Latex Itching   Other Other (See Comments)   Pioglitazone Other (See Comments)    Social History   Socioeconomic History   Marital status: Widowed    Spouse name: Not on file   Number of children: Not on file   Years of education: Not on file   Highest education level: Not on file  Occupational History   Not on file  Tobacco Use   Smoking status: Every Day    Packs/day: 1.00    Years: 50.00    Pack years: 50.00    Types: Cigarettes   Smokeless tobacco: Never  Substance and Sexual Activity   Alcohol use: No   Drug use: No   Sexual activity: Never  Other Topics Concern   Not on file  Social History Narrative   Not on file   Social Determinants of Health   Financial Resource Strain: Not on file  Food Insecurity: Not on file  Transportation Needs: Not on file  Physical Activity: Not on file  Stress: Not on file  Social  Connections: Not on file  Intimate Partner Violence: Not on file     Review of Systems: General: negative for chills, fever, night sweats or weight changes.  Cardiovascular: negative for chest pain, dyspnea on exertion, edema, orthopnea, palpitations, paroxysmal nocturnal dyspnea or shortness of breath Dermatological: negative for rash Respiratory: negative for cough or wheezing Urologic: negative for hematuria Abdominal: negative for nausea, vomiting, diarrhea, bright red blood per rectum, melena, or hematemesis Neurologic: negative for visual changes, syncope, or dizziness All other systems reviewed and are otherwise negative except as noted above.    Blood pressure (!) 144/50, pulse 71, height 5\' 5"  (1.651 m), weight 118 lb (53.5 kg).  General appearance: alert and no distress Neck: no adenopathy, no carotid bruit, no JVD, supple, symmetrical, trachea midline, and thyroid  not enlarged, symmetric, no tenderness/mass/nodules Lungs: clear to auscultation bilaterally Heart: regular rate and rhythm, S1, S2 normal, no murmur, click, rub or gallop Extremities: extremities normal, atraumatic, no cyanosis or edema Pulses: 2+ and symmetric Skin: Skin color, texture, turgor normal. No rashes or lesions Neurologic: Grossly normal  EKG sinus rhythm at 71 with incomplete right bundle branch block.  I personally reviewed this EKG.  ASSESSMENT AND PLAN:   PAD (peripheral artery disease) (HCC) History of peripheral arterial disease status post orbital atherectomy and PTA of a high-grade calcified right popliteal artery lesion by myself 07/29/2013.  Her most recent Doppler studies performed 01/14/2020 revealed a widely patent right popliteal artery.  She denies claudication but does have symptoms of either of diabetic peripheral neuropathy or fibromyalgia.  Essential hypertension History of essential hypertension blood pressure measured today at 144/50.  She is on hydrochlorothiazide ramipril and  metoprolol.  Hyperlipidemia History of hyperlipidemia on statin therapy with lipid profile performed 09/13/2020 revealing total cholesterol 152, LDL of 84 and HDL 49.  Tobacco abuse History of ongoing tobacco abuse of 1 pack/day recalcitrant risk factor modification.  Paroxysmal atrial fibrillation (HCC) History of PAF maintaining sinus rhythm on Eliquis oral anticoagulation.  It was thought that this may have been related to iatrogenic hyperthyroidism.     Lorretta Harp MD FACP,FACC,FAHA, [facility] 01/12/2021 8:14 AM

## 2021-01-12 NOTE — Assessment & Plan Note (Signed)
History of peripheral arterial disease status post orbital atherectomy and PTA of a high-grade calcified right popliteal artery lesion by myself 07/29/2013.  Her most recent Doppler studies performed 01/14/2020 revealed a widely patent right popliteal artery.  She denies claudication but does have symptoms of either of diabetic peripheral neuropathy or fibromyalgia.

## 2021-01-12 NOTE — Assessment & Plan Note (Signed)
History of ongoing tobacco abuse of 1 pack/day recalcitrant risk factor modification. 

## 2021-01-21 ENCOUNTER — Ambulatory Visit (HOSPITAL_COMMUNITY)
Admission: RE | Admit: 2021-01-21 | Payer: Medicare HMO | Source: Ambulatory Visit | Attending: Cardiovascular Disease | Admitting: Cardiovascular Disease

## 2021-02-03 ENCOUNTER — Other Ambulatory Visit (HOSPITAL_COMMUNITY): Payer: Self-pay | Admitting: Cardiovascular Disease

## 2021-02-03 DIAGNOSIS — I739 Peripheral vascular disease, unspecified: Secondary | ICD-10-CM

## 2021-02-15 ENCOUNTER — Other Ambulatory Visit (HOSPITAL_COMMUNITY): Payer: Self-pay | Admitting: Cardiovascular Disease

## 2021-02-15 ENCOUNTER — Other Ambulatory Visit: Payer: Self-pay

## 2021-02-15 ENCOUNTER — Encounter: Payer: Self-pay | Admitting: *Deleted

## 2021-02-15 ENCOUNTER — Ambulatory Visit (HOSPITAL_COMMUNITY)
Admission: RE | Admit: 2021-02-15 | Discharge: 2021-02-15 | Disposition: A | Discharge status: Home / Self Care | Payer: Medicare HMO | Source: Ambulatory Visit | Attending: Cardiology | Admitting: Cardiology

## 2021-02-15 DIAGNOSIS — I739 Peripheral vascular disease, unspecified: Secondary | ICD-10-CM

## 2021-02-18 ENCOUNTER — Encounter: Payer: Self-pay | Admitting: *Deleted

## 2021-02-22 ENCOUNTER — Ambulatory Visit: Payer: Medicare HMO | Admitting: Podiatry

## 2021-02-24 ENCOUNTER — Other Ambulatory Visit: Payer: Self-pay

## 2021-02-24 ENCOUNTER — Ambulatory Visit: Payer: Medicare HMO | Admitting: Podiatry

## 2021-02-24 ENCOUNTER — Encounter: Payer: Self-pay | Admitting: Podiatry

## 2021-02-24 DIAGNOSIS — B351 Tinea unguium: Secondary | ICD-10-CM

## 2021-02-24 DIAGNOSIS — M79674 Pain in right toe(s): Secondary | ICD-10-CM

## 2021-02-24 DIAGNOSIS — M79675 Pain in left toe(s): Secondary | ICD-10-CM | POA: Diagnosis not present

## 2021-02-24 DIAGNOSIS — D689 Coagulation defect, unspecified: Secondary | ICD-10-CM

## 2021-02-24 NOTE — Progress Notes (Signed)
Subjective:   Patient ID: Ariel Cobb, female   DOB: 75 y.o.   MRN: [id number]   HPI Patient has thickened incurvated nailbeds 1-5 both feet that she cannot cut and she is on blood thinner   ROS      Objective:  Physical Exam  Thick yellow brittle nailbeds 1-5 both feet incurvated in the corners with the second right also quite incurvated     Assessment:  Chronic mycotic nail infection 1-5 both the     Plan:  Debrided nailbeds 1-5 both feet and for the right second there is a small amount of bleeding applied sterile dressing instructed on compression elevation and reappoint routine care for earlier if needed

## 2021-03-05 ENCOUNTER — Telehealth: Payer: Self-pay | Admitting: Cardiovascular Disease

## 2021-03-05 ENCOUNTER — Other Ambulatory Visit: Payer: Self-pay

## 2021-03-05 MED ORDER — ATORVASTATIN CALCIUM 80 MG PO TABS: 80.0000 mg | ORAL_TABLET | ORAL | 3 refills | Status: DC

## 2021-03-05 NOTE — Telephone Encounter (Signed)
*  STAT* If patient is at the pharmacy, call can be transferred to refill team.   1. Which medications need to be refilled? (please list name of each medication and dose if known) atorvastatin (LIPITOR) 80 MG tablet  2. Which pharmacy/location (including street and city if local pharmacy) is medication to be sent to? [id number]- [place], [place] - [address] AT [address]. Do they need a 30 day or 90 day supply? 90 day supply

## 2021-03-22 DIAGNOSIS — M810 Age-related osteoporosis without current pathological fracture: Secondary | ICD-10-CM | POA: Diagnosis not present

## 2021-03-22 DIAGNOSIS — Z794 Long term (current) use of insulin: Secondary | ICD-10-CM | POA: Diagnosis not present

## 2021-03-22 DIAGNOSIS — I10 Essential (primary) hypertension: Secondary | ICD-10-CM | POA: Diagnosis not present

## 2021-03-22 DIAGNOSIS — I739 Peripheral vascular disease, unspecified: Secondary | ICD-10-CM | POA: Diagnosis not present

## 2021-03-22 DIAGNOSIS — J449 Chronic obstructive pulmonary disease, unspecified: Secondary | ICD-10-CM | POA: Diagnosis not present

## 2021-03-22 DIAGNOSIS — R69 Illness, unspecified: Secondary | ICD-10-CM | POA: Diagnosis not present

## 2021-03-22 DIAGNOSIS — I480 Paroxysmal atrial fibrillation: Secondary | ICD-10-CM | POA: Diagnosis not present

## 2021-03-22 DIAGNOSIS — N1832 Chronic kidney disease, stage 3b: Secondary | ICD-10-CM | POA: Diagnosis not present

## 2021-03-22 DIAGNOSIS — I700 Atherosclerosis of aorta: Secondary | ICD-10-CM | POA: Diagnosis not present

## 2021-03-22 DIAGNOSIS — E039 Hypothyroidism, unspecified: Secondary | ICD-10-CM | POA: Diagnosis not present

## 2021-05-17 DIAGNOSIS — N183 Chronic kidney disease, stage 3 unspecified: Secondary | ICD-10-CM | POA: Diagnosis not present

## 2021-05-25 ENCOUNTER — Emergency Department (HOSPITAL_COMMUNITY): Payer: Medicare HMO

## 2021-05-25 ENCOUNTER — Other Ambulatory Visit: Payer: Self-pay

## 2021-05-25 ENCOUNTER — Emergency Department (HOSPITAL_COMMUNITY)
Admission: EM | Admit: 2021-05-25 | Discharge: 2021-05-26 | Disposition: A | Discharge status: Home / Self Care | Payer: Medicare HMO | Attending: Emergency Medicine | Admitting: Emergency Medicine

## 2021-05-25 DIAGNOSIS — Z794 Long term (current) use of insulin: Secondary | ICD-10-CM | POA: Diagnosis not present

## 2021-05-25 DIAGNOSIS — Z79899 Other long term (current) drug therapy: Secondary | ICD-10-CM | POA: Diagnosis not present

## 2021-05-25 DIAGNOSIS — Z91040 Latex allergy status: Secondary | ICD-10-CM | POA: Diagnosis not present

## 2021-05-25 DIAGNOSIS — S0003XA Contusion of scalp, initial encounter: Secondary | ICD-10-CM | POA: Diagnosis not present

## 2021-05-25 DIAGNOSIS — I129 Hypertensive chronic kidney disease with stage 1 through stage 4 chronic kidney disease, or unspecified chronic kidney disease: Secondary | ICD-10-CM | POA: Diagnosis not present

## 2021-05-25 DIAGNOSIS — Z7951 Long term (current) use of inhaled steroids: Secondary | ICD-10-CM | POA: Insufficient documentation

## 2021-05-25 DIAGNOSIS — J45909 Unspecified asthma, uncomplicated: Secondary | ICD-10-CM | POA: Insufficient documentation

## 2021-05-25 DIAGNOSIS — N183 Chronic kidney disease, stage 3 unspecified: Secondary | ICD-10-CM | POA: Diagnosis not present

## 2021-05-25 DIAGNOSIS — S0083XA Contusion of other part of head, initial encounter: Secondary | ICD-10-CM | POA: Insufficient documentation

## 2021-05-25 DIAGNOSIS — Z7901 Long term (current) use of anticoagulants: Secondary | ICD-10-CM | POA: Diagnosis not present

## 2021-05-25 DIAGNOSIS — I480 Paroxysmal atrial fibrillation: Secondary | ICD-10-CM | POA: Diagnosis not present

## 2021-05-25 DIAGNOSIS — S0990XA Unspecified injury of head, initial encounter: Secondary | ICD-10-CM | POA: Diagnosis not present

## 2021-05-25 DIAGNOSIS — F17210 Nicotine dependence, cigarettes, uncomplicated: Secondary | ICD-10-CM | POA: Diagnosis not present

## 2021-05-25 DIAGNOSIS — S8002XA Contusion of left knee, initial encounter: Secondary | ICD-10-CM | POA: Diagnosis not present

## 2021-05-25 DIAGNOSIS — Z043 Encounter for examination and observation following other accident: Secondary | ICD-10-CM | POA: Diagnosis not present

## 2021-05-25 DIAGNOSIS — W01198A Fall on same level from slipping, tripping and stumbling with subsequent striking against other object, initial encounter: Secondary | ICD-10-CM | POA: Diagnosis not present

## 2021-05-25 DIAGNOSIS — E1122 Type 2 diabetes mellitus with diabetic chronic kidney disease: Secondary | ICD-10-CM | POA: Diagnosis not present

## 2021-05-25 DIAGNOSIS — E039 Hypothyroidism, unspecified: Secondary | ICD-10-CM | POA: Insufficient documentation

## 2021-05-25 DIAGNOSIS — M47812 Spondylosis without myelopathy or radiculopathy, cervical region: Secondary | ICD-10-CM | POA: Diagnosis not present

## 2021-05-25 DIAGNOSIS — R69 Illness, unspecified: Secondary | ICD-10-CM | POA: Diagnosis not present

## 2021-05-25 DIAGNOSIS — M25562 Pain in left knee: Secondary | ICD-10-CM | POA: Diagnosis not present

## 2021-05-25 NOTE — ED Triage Notes (Signed)
Pt arrives POV from orthopedist for CT head. [place] this morning after tripping while bringing groceries into the house. Denies LOC, did hit forehead on the floor. Is on eliquis. A/O x 4 without neuro deficits. Knee already in immobilizer.

## 2021-05-26 LAB — CBG MONITORING, ED: Glucose-Capillary: 135 mg/dL — ABNORMAL HIGH (ref 70–99)

## 2021-05-26 NOTE — ED Provider Notes (Signed)
[facility] EMERGENCY DEPARTMENT Provider Note   CSN: [id number] Arrival date & time: 05/25/21  1808     History Chief Complaint  Patient presents with   Ariel Cobb    Ariel Cobb is a 75 y.o. female.  Mechanical fall yesterday.  Pain to the left knee.  Was seen by orthopedics yesterday and placed in a knee immobilizer.  She was sent to the emergency department because she did hit her head.  She is on blood thinner.  No headaches, no neurologic symptoms.   Fall This is a new problem. The current episode started yesterday. The problem has been resolved. Pertinent negatives include no chest pain, no abdominal pain, no headaches and no shortness of breath. Nothing aggravates the symptoms. Nothing relieves the symptoms.      Past Medical History:  Diagnosis Date   Anemia    slight told feb dr polite   Arthritis    Asthma    Chronic bronchitis ([place])    Chronic kidney disease    sees dr sanford yearly stage 3 stable   Claudication ([place])    Complication of anesthesia 2000   slow to awaken after hip surgery   Hyperlipidemia    Hypertension    Hypothyroidism    Osteoporosis    PAD (peripheral artery disease) ([place])    a. 07/29/13: s/p PV angiogram with successful diamondback orbital rotational atherectomy of the high-grade calcified R popliteal artery stenosis   Shortness of breath    on exertion   Thyroid disease    Type II diabetes mellitus ([place])     Patient Active Problem List   Diagnosis Date Noted   Anticoagulated 01/28/2019   Emphysema of lung ([place]) 01/28/2019   Paroxysmal atrial fibrillation ([place]) 01/10/2019   Hypothyroidism    Type II diabetes mellitus ([place])    CRI (chronic renal insufficiency), stage 3 (moderate) (HCC)    PAD (peripheral artery disease) ([place])    Essential hypertension 05/06/2013   Hyperlipidemia 05/06/2013   Insulin dependent type 2 diabetes mellitus ([place]) 05/06/2013   Tobacco abuse 05/06/2013   Claudication ([place]) 05/06/2013     Past Surgical History:  Procedure Laterality Date   ABDOMINAL AORTAGRAM  06/04/2013   Procedure: ABDOMINAL AORTAGRAM;  Surgeon: Lorretta Harp, MD;  Location: [facility] CATH LAB;  Service: Cardiovascular;;   ATHERECTOMY Right 07/29/2013   popliteal/notes 07/29/2013   CHOLECYSTECTOMY  1980's?   COLONOSCOPY WITH PROPOFOL N/A 09/06/2016   Procedure: COLONOSCOPY WITH PROPOFOL;  Surgeon: Garlan Fair, MD;  Location: [facility] ENDOSCOPY;  Service: Endoscopy;  Laterality: N/A;   cyst from right hand     ESOPHAGOGASTRODUODENOSCOPY (EGD) WITH PROPOFOL N/A 09/06/2016   Procedure: ESOPHAGOGASTRODUODENOSCOPY (EGD) WITH PROPOFOL;  Surgeon: Garlan Fair, MD;  Location: [facility] ENDOSCOPY;  Service: Endoscopy;  Laterality: N/A;   HIP FRACTURE SURGERY Left 2000's   "I broke the ball; dr broke femur during OR; got a rod in there" 92/07/2013)   LOWER EXTREMITY ANGIOGRAM N/A 06/04/2013   Procedure: LOWER EXTREMITY ANGIOGRAM;  Surgeon: Lorretta Harp, MD;  Location: [facility] CATH LAB;  Service: Cardiovascular;  Laterality: N/A;     OB History   No obstetric history on file.     Family History  Problem Relation Age of Onset   Heart disease Father     Social History   Tobacco Use   Smoking status: Every Day    Packs/day: 1.00    Years: 50.00    Pack years: 50.00    Types:  Cigarettes   Smokeless tobacco: Never  Substance Use Topics   Alcohol use: No   Drug use: No    Home Medications Prior to Admission medications   Medication Sig Start Date End Date Taking? Authorizing Provider  albuterol (PROVENTIL HFA;VENTOLIN HFA) 108 (90 BASE) MCG/ACT inhaler Inhale into the lungs every 6 (six) hours as needed for wheezing or shortness of breath.    [provider]  apixaban (ELIQUIS) 5 MG TABS tablet Take 1 tablet (5 mg total) by mouth 2 (two) times daily. 01/12/21   Lorretta Harp, MD  atorvastatin (LIPITOR) 80 MG tablet Take 1 tablet (80 mg total) by mouth every morning. 03/05/21   Lorretta Harp, MD   budesonide-formoterol [facility]) 160-4.5 MCG/ACT inhaler Inhale 2 puffs into the lungs 2 (two) times daily.    [provider]  Calcium Carb-Cholecalciferol 600-800 MG-UNIT TABS Take 1 tablet by mouth daily.     [provider]  clobetasol (TEMOVATE) 0.05 % external solution APPLY TO AFFECTED AREA UP TO TWICE A DAY AS NEEDED (NOT TO FACE, GROIN, OR AXILLA) 07/31/19   [provider]  ferrous sulfate 325 (65 FE) MG tablet Take 325 mg by mouth every other day.    [provider]  hydrochlorothiazide (HYDRODIURIL) 25 MG tablet Take 25 mg by mouth daily. 12/15/14   [provider]  insulin glargine (LANTUS) 100 UNIT/ML Solostar Pen Inject 8 Units into the skin daily at 2 PM.     [provider]  levothyroxine (SYNTHROID) 75 MCG tablet  11/05/18   [provider]  metoprolol succinate (TOPROL-XL) 25 MG 24 hr tablet TAKE 1 TABLET BY MOUTH EVERY DAY 10/16/20   Lorretta Harp, MD  NOVOLOG FLEXPEN 100 UNIT/ML FlexPen Inject 4 Units into the skin 3 (three) times daily. Inject 4 units in the morning, 3 units at lunch. (Sliding scale) 12/23/14   [provider]  ONE TOUCH ULTRA TEST test strip TEST THREE TIMES DAILY E11.22 10/06/17   [provider]  ramipril (ALTACE) 10 MG capsule Take 20 mg by mouth 2 (two) times daily.  01/30/13   [provider]  sodium chloride (OCEAN) 0.65 % SOLN nasal spray Place 1 spray into both nostrils as needed for congestion.    [provider]    Allergies    Adhesive [tape], Amoxicillin, Latex, Other, and Pioglitazone  Review of Systems   Review of Systems  Constitutional:  Negative for chills and fever.  HENT:  Negative for ear pain and sore throat.   Eyes:  Negative for pain and visual disturbance.  Respiratory:  Negative for cough and shortness of breath.   Cardiovascular:  Negative for chest pain and palpitations.  Gastrointestinal:  Negative for abdominal pain and vomiting.   Genitourinary:  Negative for dysuria and hematuria.  Musculoskeletal:  Positive for arthralgias and gait problem. Negative for back pain.  Skin:  Positive for color change. Negative for rash.  Neurological:  Negative for seizures, syncope and headaches.  All other systems reviewed and are negative.  Physical Exam Updated Vital Signs BP (!) 179/58 (BP Location: Right Arm)   Pulse 68   Temp 98 F (36.7 C) (Oral)   Resp 16   SpO2 100%   Physical Exam Vitals and nursing note reviewed.  Constitutional:      General: She is not in acute distress.    Appearance: She is well-developed.  HENT:     Head:     Comments: Bruising and mild  hematoma to the forehead Eyes:     Conjunctiva/sclera: Conjunctivae normal.  Cardiovascular:     Rate and Rhythm: Normal rate and regular rhythm.     Heart sounds: No murmur heard. Pulmonary:     Effort: Pulmonary effort is normal. No respiratory distress.     Breath sounds: Normal breath sounds.  Abdominal:     Palpations: Abdomen is soft.     Tenderness: There is no abdominal tenderness.  Musculoskeletal:        General: Tenderness (left knee) present. No swelling.     Cervical back: Normal range of motion and neck supple. No tenderness.     Comments: No midline spinal tenderness  Skin:    General: Skin is warm and dry.     Capillary Refill: Capillary refill takes less than 2 seconds.  Neurological:     General: No focal deficit present.     Mental Status: She is alert and oriented to person, place, and time.     Cranial Nerves: No cranial nerve deficit.     Sensory: No sensory deficit.     Motor: No weakness.     Coordination: Coordination normal.     Comments: 5+ out of 5 strength, normal sensation, no drift, normal finger-to-nose finger  Psychiatric:        Mood and Affect: Mood normal.    ED Results / Procedures / Treatments   Labs (all labs ordered are listed, but only abnormal results are displayed) Labs Reviewed  CBG  MONITORING, ED - Abnormal; Notable for the following components:      Result Value   Glucose-Capillary 135 (*)    All other components within normal limits    EKG None  Radiology CT Head Wo Contrast  Result Date: 05/25/2021 CLINICAL DATA:  Fall on Eliquis EXAM: CT HEAD WITHOUT CONTRAST CT CERVICAL SPINE WITHOUT CONTRAST TECHNIQUE: Multidetector CT imaging of the head and cervical spine was performed following the standard protocol without intravenous contrast. Multiplanar CT image reconstructions of the cervical spine were also generated. COMPARISON:  None FINDINGS: CT HEAD FINDINGS Brain: No acute territorial infarction, hemorrhage or intracranial mass. The ventricles are nonenlarged. Mild atrophy. Vascular: No hyperdense vessels. Vertebral and carotid vascular calcification. Skull: No fracture Sinuses/Orbits: No acute finding.  Left mastoid effusion Other: Small left forehead scalp hematoma. CT CERVICAL SPINE FINDINGS Alignment: No subluxation.  Facet alignment within normal limits. Skull base and vertebrae: No acute fracture. No primary bone lesion or focal pathologic process. Soft tissues and spinal canal: No prevertebral fluid or swelling. No visible canal hematoma. Disc levels: Moderate disc space narrowing and degenerative change C4-C5, C5-C6 and C6-C7. Small central disc protrusion C6-C7 with mild mass effect on the thecal sac. Facet degenerative changes at multiple levels. Upper chest: Apical emphysema and scarring Other: None IMPRESSION: 1. No CT evidence for acute intracranial abnormality.  Mild atrophy 2. Degenerative changes of the cervical spine. No acute osseous abnormality. Electronically Signed   By: Donavan Foil M.D.   On: 05/25/2021 19:22   CT Cervical Spine Wo Contrast  Result Date: 05/25/2021 CLINICAL DATA:  Fall on Eliquis EXAM: CT HEAD WITHOUT CONTRAST CT CERVICAL SPINE WITHOUT CONTRAST TECHNIQUE: Multidetector CT imaging of the head and cervical spine was performed  following the standard protocol without intravenous contrast. Multiplanar CT image reconstructions of the cervical spine were also generated. COMPARISON:  None FINDINGS: CT HEAD FINDINGS Brain: No acute territorial infarction, hemorrhage or intracranial mass. The ventricles are nonenlarged. Mild atrophy. Vascular: No hyperdense  vessels. Vertebral and carotid vascular calcification. Skull: No fracture Sinuses/Orbits: No acute finding.  Left mastoid effusion Other: Small left forehead scalp hematoma. CT CERVICAL SPINE FINDINGS Alignment: No subluxation.  Facet alignment within normal limits. Skull base and vertebrae: No acute fracture. No primary bone lesion or focal pathologic process. Soft tissues and spinal canal: No prevertebral fluid or swelling. No visible canal hematoma. Disc levels: Moderate disc space narrowing and degenerative change C4-C5, C5-C6 and C6-C7. Small central disc protrusion C6-C7 with mild mass effect on the thecal sac. Facet degenerative changes at multiple levels. Upper chest: Apical emphysema and scarring Other: None IMPRESSION: 1. No CT evidence for acute intracranial abnormality.  Mild atrophy 2. Degenerative changes of the cervical spine. No acute osseous abnormality. Electronically Signed   By: Donavan Foil M.D.   On: 05/25/2021 19:22    Procedures Procedures   Medications Ordered in ED Medications - No data to display  ED Course  I have reviewed the triage vital signs and the nursing notes.  Pertinent labs & imaging results that were available during my care of the patient were reviewed by me and considered in my medical decision making (see chart for details).    MDM Rules/Calculators/A&P                           Ariel Cobb is here after mechanical fall yesterday.  Normal vitals.  No fever.  CT head and neck done prior to my evaluation which are normal.  She was seen yesterday by orthopedics and diagnosed with knee injury and placed in a knee immobilizer and  will continue to follow with them.  She has no other pain elsewhere.  No midline spinal pain.  She is on blood thinner but has no abdominal pain or other issues on exam.  Discharged in good condition.  This chart was dictated using voice recognition software.  Despite best efforts to proofread,  errors can occur which can change the documentation meaning.  Final Clinical Impression(s) / ED Diagnoses Final diagnoses:  Acute pain of left knee  Contusion of forehead, initial encounter    Rx / DC Orders ED Discharge Orders     None        Lennice Sites, DO 05/26/21 [phone]

## 2021-05-26 NOTE — ED Notes (Signed)
Pt not on unit. Unable to take vitals

## 2021-06-03 DIAGNOSIS — M25562 Pain in left knee: Secondary | ICD-10-CM | POA: Diagnosis not present

## 2021-06-09 DIAGNOSIS — Z720 Tobacco use: Secondary | ICD-10-CM | POA: Diagnosis not present

## 2021-06-09 DIAGNOSIS — Z794 Long term (current) use of insulin: Secondary | ICD-10-CM | POA: Diagnosis not present

## 2021-06-09 DIAGNOSIS — N1832 Chronic kidney disease, stage 3b: Secondary | ICD-10-CM | POA: Diagnosis not present

## 2021-06-09 DIAGNOSIS — E1022 Type 1 diabetes mellitus with diabetic chronic kidney disease: Secondary | ICD-10-CM | POA: Diagnosis not present

## 2021-06-09 DIAGNOSIS — E1165 Type 2 diabetes mellitus with hyperglycemia: Secondary | ICD-10-CM | POA: Diagnosis not present

## 2021-06-30 ENCOUNTER — Ambulatory Visit: Payer: Medicare HMO | Admitting: Podiatry

## 2021-07-21 ENCOUNTER — Other Ambulatory Visit: Payer: Self-pay | Admitting: Cardiovascular Disease

## 2021-07-21 NOTE — Telephone Encounter (Signed)
Prescription refill request for Eliquis received. Indication:Afib Last office visit:7/22 Scr:1.4 Age: 76 Weight:53.5 kg  Prescription refilled

## 2021-08-05 ENCOUNTER — Emergency Department (HOSPITAL_COMMUNITY): Payer: Medicare HMO

## 2021-08-05 ENCOUNTER — Inpatient Hospital Stay (HOSPITAL_COMMUNITY)
Admission: EM | Admit: 2021-08-05 | Discharge: 2021-08-13 | DRG: 481 | Disposition: A | Discharge status: Skilled Nursing Facility | Payer: Medicare HMO | Attending: Internal Medicine | Admitting: Internal Medicine

## 2021-08-05 DIAGNOSIS — E039 Hypothyroidism, unspecified: Secondary | ICD-10-CM | POA: Diagnosis present

## 2021-08-05 DIAGNOSIS — Z881 Allergy status to other antibiotic agents status: Secondary | ICD-10-CM

## 2021-08-05 DIAGNOSIS — K5900 Constipation, unspecified: Secondary | ICD-10-CM | POA: Diagnosis not present

## 2021-08-05 DIAGNOSIS — S72001A Fracture of unspecified part of neck of right femur, initial encounter for closed fracture: Secondary | ICD-10-CM | POA: Diagnosis not present

## 2021-08-05 DIAGNOSIS — E1169 Type 2 diabetes mellitus with other specified complication: Secondary | ICD-10-CM | POA: Diagnosis not present

## 2021-08-05 DIAGNOSIS — I129 Hypertensive chronic kidney disease with stage 1 through stage 4 chronic kidney disease, or unspecified chronic kidney disease: Secondary | ICD-10-CM | POA: Diagnosis present

## 2021-08-05 DIAGNOSIS — E1151 Type 2 diabetes mellitus with diabetic peripheral angiopathy without gangrene: Secondary | ICD-10-CM | POA: Diagnosis not present

## 2021-08-05 DIAGNOSIS — Z91048 Other nonmedicinal substance allergy status: Secondary | ICD-10-CM

## 2021-08-05 DIAGNOSIS — D259 Leiomyoma of uterus, unspecified: Secondary | ICD-10-CM | POA: Diagnosis not present

## 2021-08-05 DIAGNOSIS — I10 Essential (primary) hypertension: Secondary | ICD-10-CM | POA: Diagnosis present

## 2021-08-05 DIAGNOSIS — E119 Type 2 diabetes mellitus without complications: Secondary | ICD-10-CM | POA: Diagnosis not present

## 2021-08-05 DIAGNOSIS — E782 Mixed hyperlipidemia: Secondary | ICD-10-CM | POA: Diagnosis present

## 2021-08-05 DIAGNOSIS — R69 Illness, unspecified: Secondary | ICD-10-CM | POA: Diagnosis not present

## 2021-08-05 DIAGNOSIS — S72141A Displaced intertrochanteric fracture of right femur, initial encounter for closed fracture: Secondary | ICD-10-CM | POA: Diagnosis not present

## 2021-08-05 DIAGNOSIS — N1832 Chronic kidney disease, stage 3b: Secondary | ICD-10-CM | POA: Diagnosis not present

## 2021-08-05 DIAGNOSIS — S51011A Laceration without foreign body of right elbow, initial encounter: Secondary | ICD-10-CM | POA: Diagnosis not present

## 2021-08-05 DIAGNOSIS — E1122 Type 2 diabetes mellitus with diabetic chronic kidney disease: Secondary | ICD-10-CM | POA: Diagnosis not present

## 2021-08-05 DIAGNOSIS — R58 Hemorrhage, not elsewhere classified: Secondary | ICD-10-CM | POA: Diagnosis not present

## 2021-08-05 DIAGNOSIS — Z91040 Latex allergy status: Secondary | ICD-10-CM

## 2021-08-05 DIAGNOSIS — R9431 Abnormal electrocardiogram [ECG] [EKG]: Secondary | ICD-10-CM | POA: Diagnosis not present

## 2021-08-05 DIAGNOSIS — Z20822 Contact with and (suspected) exposure to covid-19: Secondary | ICD-10-CM | POA: Diagnosis present

## 2021-08-05 DIAGNOSIS — Z743 Need for continuous supervision: Secondary | ICD-10-CM | POA: Diagnosis not present

## 2021-08-05 DIAGNOSIS — Z23 Encounter for immunization: Secondary | ICD-10-CM

## 2021-08-05 DIAGNOSIS — Z7951 Long term (current) use of inhaled steroids: Secondary | ICD-10-CM | POA: Diagnosis not present

## 2021-08-05 DIAGNOSIS — T148XXA Other injury of unspecified body region, initial encounter: Secondary | ICD-10-CM | POA: Diagnosis not present

## 2021-08-05 DIAGNOSIS — Z01818 Encounter for other preprocedural examination: Secondary | ICD-10-CM | POA: Diagnosis not present

## 2021-08-05 DIAGNOSIS — I480 Paroxysmal atrial fibrillation: Secondary | ICD-10-CM | POA: Diagnosis not present

## 2021-08-05 DIAGNOSIS — F17210 Nicotine dependence, cigarettes, uncomplicated: Secondary | ICD-10-CM | POA: Diagnosis present

## 2021-08-05 DIAGNOSIS — W010XXA Fall on same level from slipping, tripping and stumbling without subsequent striking against object, initial encounter: Secondary | ICD-10-CM | POA: Diagnosis not present

## 2021-08-05 DIAGNOSIS — Z7901 Long term (current) use of anticoagulants: Secondary | ICD-10-CM | POA: Diagnosis not present

## 2021-08-05 DIAGNOSIS — Z794 Long term (current) use of insulin: Secondary | ICD-10-CM

## 2021-08-05 DIAGNOSIS — M1990 Unspecified osteoarthritis, unspecified site: Secondary | ICD-10-CM | POA: Diagnosis present

## 2021-08-05 DIAGNOSIS — M1611 Unilateral primary osteoarthritis, right hip: Secondary | ICD-10-CM | POA: Diagnosis not present

## 2021-08-05 DIAGNOSIS — Z043 Encounter for examination and observation following other accident: Secondary | ICD-10-CM | POA: Diagnosis not present

## 2021-08-05 DIAGNOSIS — R739 Hyperglycemia, unspecified: Secondary | ICD-10-CM | POA: Diagnosis not present

## 2021-08-05 DIAGNOSIS — D62 Acute posthemorrhagic anemia: Secondary | ICD-10-CM | POA: Diagnosis not present

## 2021-08-05 DIAGNOSIS — W19XXXA Unspecified fall, initial encounter: Secondary | ICD-10-CM

## 2021-08-05 DIAGNOSIS — I700 Atherosclerosis of aorta: Secondary | ICD-10-CM | POA: Diagnosis not present

## 2021-08-05 DIAGNOSIS — Z419 Encounter for procedure for purposes other than remedying health state, unspecified: Secondary | ICD-10-CM

## 2021-08-05 DIAGNOSIS — Z79899 Other long term (current) drug therapy: Secondary | ICD-10-CM | POA: Diagnosis not present

## 2021-08-05 DIAGNOSIS — Z888 Allergy status to other drugs, medicaments and biological substances status: Secondary | ICD-10-CM

## 2021-08-05 DIAGNOSIS — D631 Anemia in chronic kidney disease: Secondary | ICD-10-CM | POA: Diagnosis present

## 2021-08-05 DIAGNOSIS — M25551 Pain in right hip: Secondary | ICD-10-CM | POA: Diagnosis not present

## 2021-08-05 DIAGNOSIS — J449 Chronic obstructive pulmonary disease, unspecified: Secondary | ICD-10-CM | POA: Diagnosis present

## 2021-08-05 DIAGNOSIS — Z716 Tobacco abuse counseling: Secondary | ICD-10-CM

## 2021-08-05 DIAGNOSIS — Z9049 Acquired absence of other specified parts of digestive tract: Secondary | ICD-10-CM

## 2021-08-05 DIAGNOSIS — S72142A Displaced intertrochanteric fracture of left femur, initial encounter for closed fracture: Secondary | ICD-10-CM | POA: Diagnosis not present

## 2021-08-05 LAB — BASIC METABOLIC PANEL
Anion gap: 10 (ref 5–15)
BUN: 43 mg/dL — ABNORMAL HIGH (ref 8–23)
CO2: 27 mmol/L (ref 22–32)
Calcium: 9.1 mg/dL (ref 8.9–10.3)
Chloride: 99 mmol/L (ref 98–111)
Creatinine, Ser: 1.6 mg/dL — ABNORMAL HIGH (ref 0.44–1.00)
GFR, Estimated: 33 mL/min — ABNORMAL LOW (ref >60)
Glucose, Bld: 226 mg/dL — ABNORMAL HIGH (ref 70–99)
Potassium: 3.8 mmol/L (ref 3.5–5.1)
Sodium: 136 mmol/L (ref 135–145)

## 2021-08-05 LAB — CBC WITH DIFFERENTIAL/PLATELET
Abs Immature Granulocytes: 0.06 10*3/uL (ref 0.00–0.07)
Basophils Absolute: 0.1 10*3/uL (ref 0.0–0.1)
Basophils Relative: 0 %
Eosinophils Absolute: 0 10*3/uL (ref 0.0–0.5)
Eosinophils Relative: 0 %
HCT: 33.8 % — ABNORMAL LOW (ref 36.0–46.0)
Hemoglobin: 11.3 g/dL — ABNORMAL LOW (ref 12.0–15.0)
Immature Granulocytes: 1 %
Lymphocytes Relative: 9 %
Lymphs Abs: 1.1 10*3/uL (ref 0.7–4.0)
MCH: 32.5 pg (ref 26.0–34.0)
MCHC: 33.4 g/dL (ref 30.0–36.0)
MCV: 97.1 fL (ref 80.0–100.0)
Monocytes Absolute: 0.8 10*3/uL (ref 0.1–1.0)
Monocytes Relative: 7 %
Neutro Abs: 9.9 10*3/uL — ABNORMAL HIGH (ref 1.7–7.7)
Neutrophils Relative %: 83 %
Platelets: 307 10*3/uL (ref 150–400)
RBC: 3.48 MIL/uL — ABNORMAL LOW (ref 3.87–5.11)
RDW: 13.5 % (ref 11.5–15.5)
WBC: 12 10*3/uL — ABNORMAL HIGH (ref 4.0–10.5)
nRBC: 0 % (ref 0.0–0.2)

## 2021-08-05 LAB — PROTIME-INR
INR: 1.2 (ref 0.8–1.2)
Prothrombin Time: 15.4 seconds — ABNORMAL HIGH (ref 11.4–15.2)

## 2021-08-05 MED ORDER — FENTANYL CITRATE PF 50 MCG/ML IJ SOSY
50.0000 ug | PREFILLED_SYRINGE | INTRAMUSCULAR | Status: AC | PRN
  Administered 2021-08-05 (×2): 50 ug via INTRAVENOUS
  Filled 2021-08-05 (×2): qty 1

## 2021-08-05 MED ORDER — ONDANSETRON HCL 4 MG/2ML IJ SOLN
4.0000 mg | Freq: Once | INTRAMUSCULAR | Status: AC
  Administered 2021-08-05: 4 mg via INTRAVENOUS
  Filled 2021-08-05: qty 2

## 2021-08-05 MED ORDER — TETANUS-DIPHTH-ACELL PERTUSSIS 5-2.5-18.5 LF-MCG/0.5 IM SUSY
0.5000 mL | PREFILLED_SYRINGE | Freq: Once | INTRAMUSCULAR | Status: AC
  Administered 2021-08-05: 0.5 mL via INTRAMUSCULAR
  Filled 2021-08-05: qty 0.5

## 2021-08-05 NOTE — ED Notes (Signed)
Pt in x-ray at this time

## 2021-08-05 NOTE — ED Notes (Signed)
Patient transported to CT 

## 2021-08-05 NOTE — ED Provider Notes (Addendum)
[facility] EMERGENCY DEPARTMENT  Provider Note  CSN: [id number] Arrival date & time: 08/05/21 1823  History Chief Complaint  Patient presents with   Lytle Michaels    Ariel Cobb is a 76 y.o. female with history of afib on eliquis reports she stumbled and fell in her driveway this afternoon, landing on her right side, sustaining a skin tear of her R elbow and injuring her R hip. Unable to stand or bear weight. Denies head injury or LOC.    Home Medications Prior to Admission medications   Medication Sig Start Date End Date Taking? Authorizing Provider  albuterol (PROVENTIL HFA;VENTOLIN HFA) 108 (90 BASE) MCG/ACT inhaler Inhale into the lungs every 6 (six) hours as needed for wheezing or shortness of breath.    [provider]  atorvastatin (LIPITOR) 80 MG tablet Take 1 tablet (80 mg total) by mouth every morning. 03/05/21   Lorretta Harp, MD  budesonide-formoterol [facility]) 160-4.5 MCG/ACT inhaler Inhale 2 puffs into the lungs 2 (two) times daily.    [provider]  Calcium Carb-Cholecalciferol 600-800 MG-UNIT TABS Take 1 tablet by mouth daily.     [provider]  clobetasol (TEMOVATE) 0.05 % external solution APPLY TO AFFECTED AREA UP TO TWICE A DAY AS NEEDED (NOT TO FACE, GROIN, OR AXILLA) 07/31/19   [provider]  ELIQUIS 5 MG TABS tablet TAKE 1 TABLET BY MOUTH TWICE A DAY 07/21/21   Lorretta Harp, MD  ferrous sulfate 325 (65 FE) MG tablet Take 325 mg by mouth every other day.    [provider]  hydrochlorothiazide (HYDRODIURIL) 25 MG tablet Take 25 mg by mouth daily. 12/15/14   [provider]  insulin glargine (LANTUS) 100 UNIT/ML Solostar Pen Inject 8 Units into the skin daily at 2 PM.     [provider]  levothyroxine (SYNTHROID) 75 MCG tablet  11/05/18   [provider]  metoprolol succinate (TOPROL-XL) 25 MG 24 hr tablet TAKE 1 TABLET BY MOUTH EVERY DAY 10/16/20   Lorretta Harp, MD   NOVOLOG FLEXPEN 100 UNIT/ML FlexPen Inject 4 Units into the skin 3 (three) times daily. Inject 4 units in the morning, 3 units at lunch. (Sliding scale) 12/23/14   [provider]  ONE TOUCH ULTRA TEST test strip TEST THREE TIMES DAILY E11.22 10/06/17   [provider]  ramipril (ALTACE) 10 MG capsule Take 20 mg by mouth 2 (two) times daily.  01/30/13   [provider]  sodium chloride (OCEAN) 0.65 % SOLN nasal spray Place 1 spray into both nostrils as needed for congestion.    [provider]     Allergies    Adhesive [tape], Amoxicillin, Latex, Other, and Pioglitazone   Review of Systems   Review of Systems Please see HPI for pertinent positives and negatives  Physical Exam BP (!) 154/57    Pulse 65    Temp 98.2 F (36.8 C) (Oral)    Resp 13    SpO2 100%   Physical Exam Vitals and nursing note reviewed.  Constitutional:      Appearance: Normal appearance.  HENT:     Head: Normocephalic and atraumatic.     Nose: Nose normal.     Mouth/Throat:     Mouth: Mucous membranes are moist.  Eyes:     Extraocular Movements: Extraocular movements intact.     Conjunctiva/sclera: Conjunctivae normal.  Cardiovascular:     Rate and Rhythm: Normal rate.  Pulmonary:  Effort: Pulmonary effort is normal.     Breath sounds: Normal breath sounds.  Abdominal:     General: Abdomen is flat.     Palpations: Abdomen is soft.     Tenderness: There is no abdominal tenderness.  Musculoskeletal:        General: No swelling. Normal range of motion.     Cervical back: Neck supple.     Comments: R leg is shortened and externally rotated, distally NVI  Skin:    General: Skin is warm and dry.     Comments: Moderate skin tear R elbow  Neurological:     General: No focal deficit present.     Mental Status: She is alert and oriented to person, place, and time.     Cranial Nerves: No cranial nerve deficit.  Psychiatric:        Mood and Affect: Mood normal.    ED  Results / Procedures / Treatments   EKG EKG Interpretation  Date/Time:  Thursday August 05 2021 19:47:34 EST Ventricular Rate:  76 PR Interval:  170 QRS Duration: 112 QT Interval:  428 QTC Calculation: 475 R Axis:   81 Text Interpretation: Sinus rhythm Atrial premature complex Incomplete right bundle branch block Inferior infarct, old Lateral leads are also involved No significant change since last tracing Confirmed by Calvert Cantor [phone]) on 08/05/2021 8:28:08 PM  Procedures Procedures  Medications Ordered in the ED Medications  fentaNYL (SUBLIMAZE) injection 50 mcg (50 mcg Intravenous Given 08/05/21 2214)  Tdap (BOOSTRIX) injection 0.5 mL (0.5 mLs Intramuscular Given 08/05/21 2006)    Initial Impression and Plan  Patient with a mechanical fall, she is on Eliquis, but does not have any signs of head injury. Her exam is concerning for hip fracture. ED Hip fracture order set initiated. Pain meds for comfort.   ED Course   Clinical Course as of 08/05/21 2249  Thu Aug 05, 2021  2003 Patient inadvertently had femur xray done instead of dedicated hip. Reviewed films with Dr. Mable Fill, Ortho, who requests dedicated hip xray then CT for operative planning. Admit to hospitalist.  [CS]  2026 Last eliquis dose was this morning.  [CS]  2147 CBC with mild leukocytosis and anemia.  [CS]  2119 Basic metabolic panel(!) CKD at baseline [CS]  2147 Hospitalist paged for admission.  [CS]  2249 Spoke with Dr. Marlyce Huge, Hospitalist, who will evaluate for admission.  [CS]    Clinical Course User Index [CS] Truddie Hidden, MD     MDM Rules/Calculators/A&P Medical Decision Making Problems Addressed: Closed fracture of right hip, initial encounter [facility]): acute illness or injury that poses a threat to life or bodily functions Fall: acute illness or injury that poses a threat to life or bodily functions Skin tear of right elbow without complication, initial encounter: acute illness or  injury  Amount and/or Complexity of Data Reviewed Labs: ordered. Decision-making details documented in ED Course. Radiology: ordered and independent interpretation performed. Decision-making details documented in ED Course. ECG/medicine tests: ordered and independent interpretation performed. Decision-making details documented in ED Course.  Risk Prescription drug management. Decision regarding hospitalization. Emergency major surgery.    Final Clinical Impression(s) / ED Diagnoses Final diagnoses:  Fall, initial encounter  Closed fracture of right hip, initial encounter [facility])  Skin tear of right elbow without complication, initial encounter    Rx / DC Orders ED Discharge Orders     None        Truddie Hidden, MD 08/05/21 2249

## 2021-08-05 NOTE — ED Notes (Signed)
Pt began to c/o nausea and then proceeded to spit up in a vomit bag. Dr. Cyd Silence made aware, plan to offer IV zofran

## 2021-08-05 NOTE — Progress Notes (Signed)
Patient discussed with EDP.  Right hip fracture result of mechanical fall.  Unclear fracture pattern based on current imaging.  Obtaining dedicated hip films as well as CT to further delineate and better characterize the fracture.  Patient on Eliquis.  Hold Eliquis and other anticoagulation in anticipation of possible OR tomorrow.  N.p.o. after midnight for possible OR 08/06/2021.  Nonweightbearing right lower extremity.  Orion Crook, PA-C to see patient in morning, full consult to follow.  Georgeanna Harrison M.D. Orthopaedic Surgery [facility]

## 2021-08-05 NOTE — ED Notes (Signed)
With provider's permission, pt given snacks. Provided with saline crackers, graham crackers, peanut butter, and diet ginger ale.

## 2021-08-05 NOTE — ED Triage Notes (Signed)
Mechanical fall at her driveway, landed on R side. shortening and external rotation noted on R leg. No LOC. On blood thinner- Eliquis. A&o x4. No back or neck pain. No CP or SOB.    210/80  Cbg 282  HR 70

## 2021-08-05 NOTE — ED Notes (Signed)
RN redressed skin tear injury on pt's R arm with non-adherent pads and gauze wrap.

## 2021-08-06 ENCOUNTER — Other Ambulatory Visit: Payer: Self-pay

## 2021-08-06 ENCOUNTER — Encounter (HOSPITAL_COMMUNITY)
Admission: EM | Disposition: A | Discharge status: Skilled Nursing Facility | Payer: Self-pay | Source: Home / Self Care | Attending: Internal Medicine

## 2021-08-06 ENCOUNTER — Inpatient Hospital Stay (HOSPITAL_COMMUNITY): Payer: Medicare HMO | Admitting: Anesthesiology

## 2021-08-06 ENCOUNTER — Encounter (HOSPITAL_COMMUNITY): Payer: Self-pay | Admitting: Internal Medicine

## 2021-08-06 ENCOUNTER — Inpatient Hospital Stay (HOSPITAL_COMMUNITY): Payer: Medicare HMO

## 2021-08-06 DIAGNOSIS — E1169 Type 2 diabetes mellitus with other specified complication: Secondary | ICD-10-CM

## 2021-08-06 DIAGNOSIS — N1832 Chronic kidney disease, stage 3b: Secondary | ICD-10-CM

## 2021-08-06 DIAGNOSIS — E039 Hypothyroidism, unspecified: Secondary | ICD-10-CM

## 2021-08-06 DIAGNOSIS — Z794 Long term (current) use of insulin: Secondary | ICD-10-CM

## 2021-08-06 DIAGNOSIS — S72001A Fracture of unspecified part of neck of right femur, initial encounter for closed fracture: Secondary | ICD-10-CM | POA: Diagnosis not present

## 2021-08-06 DIAGNOSIS — J449 Chronic obstructive pulmonary disease, unspecified: Secondary | ICD-10-CM

## 2021-08-06 DIAGNOSIS — I10 Essential (primary) hypertension: Secondary | ICD-10-CM

## 2021-08-06 DIAGNOSIS — E782 Mixed hyperlipidemia: Secondary | ICD-10-CM

## 2021-08-06 DIAGNOSIS — F17210 Nicotine dependence, cigarettes, uncomplicated: Secondary | ICD-10-CM | POA: Diagnosis present

## 2021-08-06 DIAGNOSIS — E1122 Type 2 diabetes mellitus with diabetic chronic kidney disease: Secondary | ICD-10-CM

## 2021-08-06 DIAGNOSIS — S72141A Displaced intertrochanteric fracture of right femur, initial encounter for closed fracture: Secondary | ICD-10-CM

## 2021-08-06 DIAGNOSIS — I480 Paroxysmal atrial fibrillation: Secondary | ICD-10-CM

## 2021-08-06 HISTORY — PX: INTRAMEDULLARY (IM) NAIL INTERTROCHANTERIC: SHX5875

## 2021-08-06 LAB — COMPREHENSIVE METABOLIC PANEL
ALT: 15 U/L (ref 0–44)
AST: 18 U/L (ref 15–41)
Albumin: 3.2 g/dL — ABNORMAL LOW (ref 3.5–5.0)
Alkaline Phosphatase: 58 U/L (ref 38–126)
Anion gap: 10 (ref 5–15)
BUN: 43 mg/dL — ABNORMAL HIGH (ref 8–23)
CO2: 26 mmol/L (ref 22–32)
Calcium: 9.1 mg/dL (ref 8.9–10.3)
Chloride: 99 mmol/L (ref 98–111)
Creatinine, Ser: 1.55 mg/dL — ABNORMAL HIGH (ref 0.44–1.00)
GFR, Estimated: 34 mL/min — ABNORMAL LOW (ref >60)
Glucose, Bld: 265 mg/dL — ABNORMAL HIGH (ref 70–99)
Potassium: 4 mmol/L (ref 3.5–5.1)
Sodium: 135 mmol/L (ref 135–145)
Total Bilirubin: 0.6 mg/dL (ref 0.3–1.2)
Total Protein: 6.4 g/dL — ABNORMAL LOW (ref 6.5–8.1)

## 2021-08-06 LAB — CBC WITH DIFFERENTIAL/PLATELET
Abs Immature Granulocytes: 0.05 10*3/uL (ref 0.00–0.07)
Basophils Absolute: 0 10*3/uL (ref 0.0–0.1)
Basophils Relative: 0 %
Eosinophils Absolute: 0 10*3/uL (ref 0.0–0.5)
Eosinophils Relative: 0 %
HCT: 30.6 % — ABNORMAL LOW (ref 36.0–46.0)
Hemoglobin: 10.3 g/dL — ABNORMAL LOW (ref 12.0–15.0)
Immature Granulocytes: 0 %
Lymphocytes Relative: 5 %
Lymphs Abs: 0.6 10*3/uL — ABNORMAL LOW (ref 0.7–4.0)
MCH: 32.5 pg (ref 26.0–34.0)
MCHC: 33.7 g/dL (ref 30.0–36.0)
MCV: 96.5 fL (ref 80.0–100.0)
Monocytes Absolute: 0.9 10*3/uL (ref 0.1–1.0)
Monocytes Relative: 8 %
Neutro Abs: 10.8 10*3/uL — ABNORMAL HIGH (ref 1.7–7.7)
Neutrophils Relative %: 87 %
Platelets: 274 10*3/uL (ref 150–400)
RBC: 3.17 MIL/uL — ABNORMAL LOW (ref 3.87–5.11)
RDW: 13.3 % (ref 11.5–15.5)
WBC: 12.5 10*3/uL — ABNORMAL HIGH (ref 4.0–10.5)
nRBC: 0 % (ref 0.0–0.2)

## 2021-08-06 LAB — RESP PANEL BY RT-PCR (FLU A&B, COVID) ARPGX2
Influenza A by PCR: NEGATIVE
Influenza B by PCR: NEGATIVE
SARS Coronavirus 2 by RT PCR: NEGATIVE

## 2021-08-06 LAB — GLUCOSE, CAPILLARY
Glucose-Capillary: 189 mg/dL — ABNORMAL HIGH (ref 70–99)
Glucose-Capillary: 102 mg/dL — ABNORMAL HIGH (ref 70–99)
Glucose-Capillary: 151 mg/dL — ABNORMAL HIGH (ref 70–99)

## 2021-08-06 LAB — SURGICAL PCR SCREEN
MRSA, PCR: NEGATIVE
Staphylococcus aureus: NEGATIVE

## 2021-08-06 LAB — ABO/RH: ABO/RH(D): O NEG

## 2021-08-06 LAB — CBG MONITORING, ED: Glucose-Capillary: 228 mg/dL — ABNORMAL HIGH (ref 70–99)

## 2021-08-06 LAB — MAGNESIUM: Magnesium: 1.8 mg/dL (ref 1.7–2.4)

## 2021-08-06 LAB — VITAMIN D 25 HYDROXY (VIT D DEFICIENCY, FRACTURES): Vit D, 25-Hydroxy: 45.01 ng/mL (ref 30–100)

## 2021-08-06 LAB — HEMOGLOBIN A1C
Hgb A1c MFr Bld: 6.1 % — ABNORMAL HIGH (ref 4.8–5.6)
Mean Plasma Glucose: 128.37 mg/dL

## 2021-08-06 SURGERY — FIXATION, FRACTURE, INTERTROCHANTERIC, WITH INTRAMEDULLARY ROD
Anesthesia: General | Laterality: Right

## 2021-08-06 MED ORDER — FENTANYL CITRATE (PF) 100 MCG/2ML IJ SOLN
25.0000 ug | INTRAMUSCULAR | Status: DC | PRN
  Administered 2021-08-06: 25 ug via INTRAVENOUS

## 2021-08-06 MED ORDER — SUGAMMADEX SODIUM 200 MG/2ML IV SOLN
INTRAVENOUS | Status: DC | PRN
  Administered 2021-08-06: 200 mg via INTRAVENOUS

## 2021-08-06 MED ORDER — POLYETHYLENE GLYCOL 3350 17 G PO PACK: 17.0000 g | PACK | Freq: Every day | ORAL | Status: DC | PRN

## 2021-08-06 MED ORDER — LACTATED RINGERS IV SOLN: INTRAVENOUS | Status: DC

## 2021-08-06 MED ORDER — OXYCODONE-ACETAMINOPHEN 5-325 MG PO TABS
1.0000 | ORAL_TABLET | ORAL | Status: DC | PRN
  Administered 2021-08-07 – 2021-08-10 (×4): 1 via ORAL
  Filled 2021-08-06 (×4): qty 1

## 2021-08-06 MED ORDER — ONDANSETRON HCL 4 MG/2ML IJ SOLN
INTRAMUSCULAR | Status: DC | PRN
  Administered 2021-08-06: 4 mg via INTRAVENOUS

## 2021-08-06 MED ORDER — CHLORHEXIDINE GLUCONATE 4 % EX LIQD
60.0000 mL | Freq: Once | CUTANEOUS | Status: DC
  Filled 2021-08-06: qty 60

## 2021-08-06 MED ORDER — TRANEXAMIC ACID-NACL 1000-0.7 MG/100ML-% IV SOLN
INTRAVENOUS | Status: DC | PRN
  Administered 2021-08-06: 1000 mg via INTRAVENOUS

## 2021-08-06 MED ORDER — ONDANSETRON HCL 4 MG PO TABS: 4.0000 mg | ORAL_TABLET | Freq: Four times a day (QID) | ORAL | Status: DC | PRN

## 2021-08-06 MED ORDER — FENTANYL CITRATE (PF) 100 MCG/2ML IJ SOLN
INTRAMUSCULAR | Status: AC
  Filled 2021-08-06: qty 2

## 2021-08-06 MED ORDER — ONDANSETRON HCL 4 MG/2ML IJ SOLN
INTRAMUSCULAR | Status: AC
  Filled 2021-08-06: qty 2

## 2021-08-06 MED ORDER — CHLORHEXIDINE GLUCONATE 0.12 % MT SOLN
OROMUCOSAL | Status: AC
  Administered 2021-08-06: 15 mL via OROMUCOSAL
  Filled 2021-08-06: qty 15

## 2021-08-06 MED ORDER — PROMETHAZINE HCL 25 MG/ML IJ SOLN
6.2500 mg | INTRAMUSCULAR | Status: DC | PRN
  Administered 2021-08-06: 6.25 mg via INTRAVENOUS

## 2021-08-06 MED ORDER — 0.9 % SODIUM CHLORIDE (POUR BTL) OPTIME
TOPICAL | Status: DC | PRN
  Administered 2021-08-06: 1000 mL

## 2021-08-06 MED ORDER — ACETAMINOPHEN 325 MG PO TABS
650.0000 mg | ORAL_TABLET | Freq: Four times a day (QID) | ORAL | Status: DC | PRN
  Administered 2021-08-06 – 2021-08-11 (×2): 650 mg via ORAL
  Filled 2021-08-06 (×2): qty 2

## 2021-08-06 MED ORDER — HYDRALAZINE HCL 20 MG/ML IJ SOLN: 10.0000 mg | Freq: Four times a day (QID) | INTRAMUSCULAR | Status: DC | PRN

## 2021-08-06 MED ORDER — METOPROLOL SUCCINATE ER 25 MG PO TB24
25.0000 mg | ORAL_TABLET | Freq: Every day | ORAL | Status: DC
  Administered 2021-08-06 – 2021-08-13 (×8): 25 mg via ORAL
  Filled 2021-08-06 (×8): qty 1

## 2021-08-06 MED ORDER — MORPHINE SULFATE (PF) 4 MG/ML IV SOLN: 4.0000 mg | INTRAVENOUS | Status: DC | PRN

## 2021-08-06 MED ORDER — METOCLOPRAMIDE HCL 5 MG/ML IJ SOLN: 5.0000 mg | Freq: Three times a day (TID) | INTRAMUSCULAR | Status: DC | PRN

## 2021-08-06 MED ORDER — ACETAMINOPHEN 500 MG PO TABS
1000.0000 mg | ORAL_TABLET | Freq: Once | ORAL | Status: AC
Start: 2021-08-06 — End: 2021-08-06
  Administered 2021-08-06: 1000 mg via ORAL
  Filled 2021-08-06: qty 2

## 2021-08-06 MED ORDER — FLUTICASONE FUROATE-VILANTEROL 200-25 MCG/ACT IN AEPB
1.0000 | INHALATION_SPRAY | Freq: Every day | RESPIRATORY_TRACT | Status: DC
  Administered 2021-08-07 – 2021-08-13 (×7): 1 via RESPIRATORY_TRACT
  Filled 2021-08-06 (×2): qty 28

## 2021-08-06 MED ORDER — DEXAMETHASONE SODIUM PHOSPHATE 10 MG/ML IJ SOLN
INTRAMUSCULAR | Status: AC
  Filled 2021-08-06: qty 1

## 2021-08-06 MED ORDER — TRANEXAMIC ACID-NACL 1000-0.7 MG/100ML-% IV SOLN
INTRAVENOUS | Status: AC
  Filled 2021-08-06: qty 100

## 2021-08-06 MED ORDER — TRANEXAMIC ACID-NACL 1000-0.7 MG/100ML-% IV SOLN
1000.0000 mg | Freq: Once | INTRAVENOUS | Status: AC
  Administered 2021-08-06: 1000 mg via INTRAVENOUS
  Filled 2021-08-06: qty 100

## 2021-08-06 MED ORDER — VANCOMYCIN HCL 1000 MG IV SOLR
INTRAVENOUS | Status: AC
  Filled 2021-08-06: qty 20

## 2021-08-06 MED ORDER — ROCURONIUM BROMIDE 10 MG/ML (PF) SYRINGE
PREFILLED_SYRINGE | INTRAVENOUS | Status: DC | PRN
  Administered 2021-08-06: 40 mg via INTRAVENOUS

## 2021-08-06 MED ORDER — LEVOTHYROXINE SODIUM 88 MCG PO TABS
88.0000 ug | ORAL_TABLET | Freq: Every day | ORAL | Status: DC
  Administered 2021-08-06 – 2021-08-13 (×8): 88 ug via ORAL
  Filled 2021-08-06 (×8): qty 1

## 2021-08-06 MED ORDER — VANCOMYCIN HCL IN DEXTROSE 1-5 GM/200ML-% IV SOLN
1000.0000 mg | INTRAVENOUS | Status: AC
  Administered 2021-08-06: 1000 mg via INTRAVENOUS
  Filled 2021-08-06 (×2): qty 200

## 2021-08-06 MED ORDER — PROPOFOL 10 MG/ML IV BOLUS
INTRAVENOUS | Status: DC | PRN
  Administered 2021-08-06: 90 mg via INTRAVENOUS

## 2021-08-06 MED ORDER — LIDOCAINE 2% (20 MG/ML) 5 ML SYRINGE
INTRAMUSCULAR | Status: DC | PRN
  Administered 2021-08-06: 100 mg via INTRAVENOUS

## 2021-08-06 MED ORDER — ONDANSETRON HCL 4 MG/2ML IJ SOLN: 4.0000 mg | Freq: Four times a day (QID) | INTRAMUSCULAR | Status: DC | PRN

## 2021-08-06 MED ORDER — ORAL CARE MOUTH RINSE: 15.0000 mL | Freq: Once | OROMUCOSAL | Status: AC

## 2021-08-06 MED ORDER — METOCLOPRAMIDE HCL 5 MG PO TABS: 5.0000 mg | ORAL_TABLET | Freq: Three times a day (TID) | ORAL | Status: DC | PRN

## 2021-08-06 MED ORDER — INSULIN GLARGINE-YFGN 100 UNIT/ML ~~LOC~~ SOLN
5.0000 [IU] | Freq: Every day | SUBCUTANEOUS | Status: DC
  Administered 2021-08-06: 5 [IU] via SUBCUTANEOUS
  Filled 2021-08-06 (×2): qty 0.05

## 2021-08-06 MED ORDER — VANCOMYCIN HCL IN DEXTROSE 1-5 GM/200ML-% IV SOLN: 1000.0000 mg | Freq: Two times a day (BID) | INTRAVENOUS | Status: DC

## 2021-08-06 MED ORDER — PROMETHAZINE HCL 25 MG/ML IJ SOLN
INTRAMUSCULAR | Status: AC
  Filled 2021-08-06: qty 1

## 2021-08-06 MED ORDER — LACTATED RINGERS IV SOLN: INTRAVENOUS | Status: AC

## 2021-08-06 MED ORDER — DOCUSATE SODIUM 100 MG PO CAPS
100.0000 mg | ORAL_CAPSULE | Freq: Two times a day (BID) | ORAL | Status: DC
  Administered 2021-08-06 – 2021-08-08 (×4): 100 mg via ORAL
  Filled 2021-08-06 (×4): qty 1

## 2021-08-06 MED ORDER — AMISULPRIDE (ANTIEMETIC) 5 MG/2ML IV SOLN: 10.0000 mg | Freq: Once | INTRAVENOUS | Status: DC | PRN

## 2021-08-06 MED ORDER — SODIUM CHLORIDE 0.9 % IV SOLN: INTRAVENOUS | Status: DC

## 2021-08-06 MED ORDER — FENTANYL CITRATE (PF) 250 MCG/5ML IJ SOLN
INTRAMUSCULAR | Status: AC
  Filled 2021-08-06: qty 5

## 2021-08-06 MED ORDER — INSULIN GLARGINE 100 UNIT/ML ~~LOC~~ SOLN
5.0000 [IU] | Freq: Every day | SUBCUTANEOUS | Status: DC
  Filled 2021-08-06: qty 0.05

## 2021-08-06 MED ORDER — INSULIN ASPART 100 UNIT/ML IJ SOLN
0.0000 [IU] | Freq: Three times a day (TID) | INTRAMUSCULAR | Status: DC
  Administered 2021-08-06: 2 [IU] via SUBCUTANEOUS
  Administered 2021-08-06: 3 [IU] via SUBCUTANEOUS
  Administered 2021-08-07: 9 [IU] via SUBCUTANEOUS
  Administered 2021-08-07: 3 [IU] via SUBCUTANEOUS
  Administered 2021-08-08: 1 [IU] via SUBCUTANEOUS
  Administered 2021-08-08: 2 [IU] via SUBCUTANEOUS
  Administered 2021-08-08: 5 [IU] via SUBCUTANEOUS
  Administered 2021-08-09: 3 [IU] via SUBCUTANEOUS
  Administered 2021-08-09 – 2021-08-10 (×3): 2 [IU] via SUBCUTANEOUS
  Administered 2021-08-10: 3 [IU] via SUBCUTANEOUS
  Administered 2021-08-11: 2 [IU] via SUBCUTANEOUS
  Administered 2021-08-11: 3 [IU] via SUBCUTANEOUS
  Administered 2021-08-11 – 2021-08-12 (×4): 2 [IU] via SUBCUTANEOUS
  Administered 2021-08-13: 1 [IU] via SUBCUTANEOUS
  Administered 2021-08-13: 2 [IU] via SUBCUTANEOUS

## 2021-08-06 MED ORDER — CHLORHEXIDINE GLUCONATE 0.12 % MT SOLN: 15.0000 mL | Freq: Once | OROMUCOSAL | Status: AC

## 2021-08-06 MED ORDER — ALBUTEROL SULFATE (2.5 MG/3ML) 0.083% IN NEBU: 2.5000 mg | INHALATION_SOLUTION | RESPIRATORY_TRACT | Status: DC | PRN

## 2021-08-06 MED ORDER — ATORVASTATIN CALCIUM 80 MG PO TABS
80.0000 mg | ORAL_TABLET | Freq: Every day | ORAL | Status: DC
  Administered 2021-08-06 – 2021-08-13 (×8): 80 mg via ORAL
  Filled 2021-08-06 (×5): qty 1
  Filled 2021-08-06: qty 2
  Filled 2021-08-06 (×2): qty 1

## 2021-08-06 MED ORDER — RAMIPRIL 5 MG PO CAPS
20.0000 mg | ORAL_CAPSULE | Freq: Every day | ORAL | Status: DC
  Administered 2021-08-06 – 2021-08-07 (×2): 20 mg via ORAL
  Filled 2021-08-06 (×3): qty 4

## 2021-08-06 MED ORDER — SALINE SPRAY 0.65 % NA SOLN: 1.0000 | NASAL | Status: DC | PRN

## 2021-08-06 MED ORDER — ACETAMINOPHEN 650 MG RE SUPP: 650.0000 mg | Freq: Four times a day (QID) | RECTAL | Status: DC | PRN

## 2021-08-06 MED ORDER — DEXAMETHASONE SODIUM PHOSPHATE 10 MG/ML IJ SOLN
INTRAMUSCULAR | Status: DC | PRN
  Administered 2021-08-06: 5 mg via INTRAVENOUS

## 2021-08-06 MED ORDER — POVIDONE-IODINE 10 % EX SWAB
2.0000 "application " | Freq: Once | CUTANEOUS | Status: AC
  Administered 2021-08-06: 2 via TOPICAL

## 2021-08-06 MED ORDER — FENTANYL CITRATE (PF) 250 MCG/5ML IJ SOLN
INTRAMUSCULAR | Status: DC | PRN
  Administered 2021-08-06: 100 ug via INTRAVENOUS

## 2021-08-06 SURGICAL SUPPLY — 47 items
BAG COUNTER SPONGE SURGICOUNT (BAG) IMPLANT
BIT DRILL INTERTAN LAG SCREW (BIT) ×1 IMPLANT
BIT DRILL LONG 4.0 (BIT) IMPLANT
BRUSH SCRUB EZ PLAIN DRY (MISCELLANEOUS) ×4 IMPLANT
CHLORAPREP W/TINT 26 (MISCELLANEOUS) ×2 IMPLANT
COVER PERINEAL POST (MISCELLANEOUS) ×2 IMPLANT
COVER SURGICAL LIGHT HANDLE (MISCELLANEOUS) ×2 IMPLANT
DERMABOND ADVANCED (GAUZE/BANDAGES/DRESSINGS) ×2
DERMABOND ADVANCED .7 DNX12 (GAUZE/BANDAGES/DRESSINGS) ×1 IMPLANT
DRAPE C-ARM 35X43 STRL (DRAPES) ×2 IMPLANT
DRAPE IMP U-DRAPE 54X76 (DRAPES) ×4 IMPLANT
DRAPE INCISE IOBAN 66X45 STRL (DRAPES) ×2 IMPLANT
DRAPE STERI IOBAN 125X83 (DRAPES) ×2 IMPLANT
DRAPE SURG 17X23 STRL (DRAPES) ×4 IMPLANT
DRAPE U-SHAPE 47X51 STRL (DRAPES) ×2 IMPLANT
DRESSING MEPILEX FLEX 4X4 (GAUZE/BANDAGES/DRESSINGS) IMPLANT
DRILL BIT LONG 4.0 (BIT) ×2
DRSG MEPILEX BORDER 4X8 (GAUZE/BANDAGES/DRESSINGS) ×1 IMPLANT
DRSG MEPILEX FLEX 4X4 (GAUZE/BANDAGES/DRESSINGS) ×4
ELECT REM PT RETURN 9FT ADLT (ELECTROSURGICAL) ×2
ELECTRODE REM PT RTRN 9FT ADLT (ELECTROSURGICAL) ×1 IMPLANT
GLOVE SURG ENC MOIS LTX SZ6.5 (GLOVE) ×6 IMPLANT
GLOVE SURG ENC MOIS LTX SZ7.5 (GLOVE) ×8 IMPLANT
GLOVE SURG UNDER POLY LF SZ6.5 (GLOVE) ×2 IMPLANT
GLOVE SURG UNDER POLY LF SZ7.5 (GLOVE) ×2 IMPLANT
GOWN STRL REUS W/ TWL LRG LVL3 (GOWN DISPOSABLE) ×1 IMPLANT
GOWN STRL REUS W/TWL LRG LVL3 (GOWN DISPOSABLE) ×2
GUIDE PIN 3.2X343 (PIN) ×2
GUIDE PIN 3.2X343MM (PIN) ×4
KIT BASIN OR (CUSTOM PROCEDURE TRAY) ×2 IMPLANT
KIT TURNOVER KIT B (KITS) ×2 IMPLANT
MANIFOLD NEPTUNE II (INSTRUMENTS) ×2 IMPLANT
NAIL INTERTAN 10X18 130D 10S (Nail) ×1 IMPLANT
NS IRRIG 1000ML POUR BTL (IV SOLUTION) ×2 IMPLANT
PACK GENERAL/GYN (CUSTOM PROCEDURE TRAY) ×2 IMPLANT
PAD ARMBOARD 7.5X6 YLW CONV (MISCELLANEOUS) ×4 IMPLANT
PIN GUIDE 3.2X343MM (PIN) IMPLANT
SCREW LAG COMPR KIT 95/90 (Screw) ×1 IMPLANT
SCREW TRIGEN LOW PROF 5.0X35 (Screw) ×1 IMPLANT
SUT MNCRL AB 3-0 PS2 18 (SUTURE) ×2 IMPLANT
SUT MNCRL AB 3-0 PS2 27 (SUTURE) ×1 IMPLANT
SUT VIC AB 0 CT1 27 (SUTURE)
SUT VIC AB 0 CT1 27XBRD ANBCTR (SUTURE) IMPLANT
SUT VIC AB 2-0 CT1 27 (SUTURE) ×4
SUT VIC AB 2-0 CT1 TAPERPNT 27 (SUTURE) ×2 IMPLANT
TOWEL GREEN STERILE (TOWEL DISPOSABLE) ×4 IMPLANT
WATER STERILE IRR 1000ML POUR (IV SOLUTION) ×2 IMPLANT

## 2021-08-06 NOTE — Assessment & Plan Note (Signed)
·   Currently rate controlled.  Holding Eliquis, last dose was taken AM of 2/9  Continue home regimen of rate controlling agent  Monitoring on telemetry

## 2021-08-06 NOTE — Progress Notes (Signed)
Patient received from ED via stretcher.  Alert and oriented x 4.  Transferred to bed with 3-person assist.  C/o right hip pain 5/10.  Assisted in bed in position of comfort.  Oriented to room and unit routine, call bell within reach.  Needs addressed.

## 2021-08-06 NOTE — Progress Notes (Signed)
[place] for apixaban  Indication: atrial fibrillation  Allergies  Allergen Reactions   Adhesive [Tape] Itching    Surgical Tape - "makes me itch and break me out" -- HAS TO USE PAPER TAPE   Amoxicillin Hives, Itching and Swelling   Latex Itching   Other Other (See Comments)   Pioglitazone Other (See Comments)    Patient Measurements: Height: 5\' 5"  (165.1 cm) Weight: 58.5 kg (128 lb 15.5 oz) IBW/kg (Calculated) : 57  Vital Signs: Temp: 97.6 F (36.4 C) (02/10 1830) Temp Source: Oral (02/10 1531) BP: 151/43 (02/10 1845) Pulse Rate: 66 (02/10 1845)  Labs: Recent Labs    08/05/21 1847 08/06/21 0210  HGB 11.3* 10.3*  HCT 33.8* 30.6*  PLT 307 274  LABPROT 15.4*  --   INR 1.2  --   CREATININE 1.60* 1.55*    Estimated Creatinine Clearance: 27.4 mL/min (A) (by C-G formula based on SCr of 1.55 mg/dL (H)).   Medical History: Past Medical History:  Diagnosis Date   Anemia    slight told feb dr polite   Arthritis    Asthma    Chronic bronchitis ([place])    Chronic kidney disease    sees dr sanford yearly stage 3 stable   Claudication ([place])    Complication of anesthesia 2000   slow to awaken after hip surgery   Hyperlipidemia    Hypertension    Hypothyroidism    Osteoporosis    PAD (peripheral artery disease) ([place])    a. 07/29/13: s/p PV angiogram with successful diamondback orbital rotational atherectomy of the high-grade calcified R popliteal artery stenosis   Shortness of breath    on exertion   Thyroid disease    Type II diabetes mellitus ([place])     Medications:  Medications Prior to Admission  Medication Sig Dispense Refill Last Dose   albuterol (PROVENTIL HFA;VENTOLIN HFA) 108 (90 BASE) MCG/ACT inhaler Inhale into the lungs every 6 (six) hours as needed for wheezing or shortness of breath.   unk   atorvastatin (LIPITOR) 80 MG tablet Take 1 tablet (80 mg total) by mouth every morning. 100 tablet 3 08/05/2021    budesonide-formoterol (SYMBICORT) 160-4.5 MCG/ACT inhaler Inhale 2 puffs into the lungs 2 (two) times daily.   08/05/2021   Calcium Carb-Cholecalciferol 600-800 MG-UNIT TABS Take 1 tablet by mouth daily.    08/05/2021   clobetasol (TEMOVATE) 0.05 % external solution 1 application 2 (two) times daily as needed (rash).   unk   ELIQUIS 5 MG TABS tablet TAKE 1 TABLET BY MOUTH TWICE A DAY (Patient taking differently: Take 5 mg by mouth 2 (two) times daily.) 180 tablet 1 08/05/2021 at 0700   ferrous sulfate 325 (65 FE) MG tablet Take 325 mg by mouth 3 (three) times a week. Jory Sims, Friday   08/04/2021   hydrochlorothiazide (HYDRODIURIL) 25 MG tablet Take 25 mg by mouth daily.  2 08/05/2021   insulin glargine (LANTUS) 100 UNIT/ML Solostar Pen Inject 8 Units into the skin daily at 2 PM.    08/05/2021   levothyroxine (SYNTHROID) 88 MCG tablet Take 88 mcg by mouth daily before breakfast.   08/05/2021   metoprolol succinate (TOPROL-XL) 25 MG 24 hr tablet TAKE 1 TABLET BY MOUTH EVERY DAY (Patient taking differently: 25 mg daily.) 90 tablet 3 08/05/2021 at 0700   NOVOLOG FLEXPEN 100 UNIT/ML FlexPen Inject 4-6 Units into the skin 3 (three) times daily. Inject 4 units in the morning, 5 units at lunch.  6 units in the evening  (Sliding scale)  11 08/05/2021   ramipril (ALTACE) 10 MG capsule Take 20 mg by mouth daily.   08/05/2021   sodium chloride (OCEAN) 0.65 % SOLN nasal spray Place 1 spray into both nostrils as needed for congestion.   unk   ONE TOUCH ULTRA TEST test strip TEST THREE TIMES DAILY E11.22  4     Assessment: 62 yof who presented with a fall and found to have a R hip fracture. On apixaban PTA for hx Afib (LD 2/9@0700 ).   Underwent nailing of R intertrochanteric femur fx 2/10. Hgb 10.3, plt 274. No s/sx of bleeding. Okay per ortho to restart if Hgb stable (>= 9) and no transfusion requirements on 2/11. Given Scr 1.55 and wt< 60 kg, will get BMP in AM to monitor Scr trend prior to ordering dose   Goal of Therapy:   Monitor platelets by anticoagulation protocol: Yes   Plan:  F/u restart apixaban restart Monitor transfusion requirements and if >=9 Monitor Scr to see if need dose reduction from home dose of 5 mg BID   Antonietta Jewel, PharmD, BCCCP Clinical Pharmacist  Phone: [phone] 08/06/2021 7:59 PM  Please check AMION for all [place] phone numbers After 10:00 PM, call [place] [phone]

## 2021-08-06 NOTE — Assessment & Plan Note (Signed)
.   Resume home regimen of Synthroid 

## 2021-08-06 NOTE — ED Notes (Signed)
Assumed pt care at this time

## 2021-08-06 NOTE — Anesthesia Postprocedure Evaluation (Signed)
Anesthesia Post Note  Patient: Ariel Cobb  Procedure(s) Performed: INTRAMEDULLARY (IM) NAIL INTERTROCHANTRIC (Right)     Patient location during evaluation: PACU Anesthesia Type: General Level of consciousness: sedated and patient cooperative Pain management: pain level controlled Vital Signs Assessment: post-procedure vital signs reviewed and stable Respiratory status: spontaneous breathing Cardiovascular status: stable Anesthetic complications: no   No notable events documented.  Last Vitals:  Vitals:   08/06/21 1945 08/06/21 2025  BP: (!) 166/55 (!) 125/46  Pulse: 65 62  Resp: 20 15  Temp: 36.6 C 36.9 C  SpO2: 100% 94%    Last Pain:  Vitals:   08/06/21 2025  TempSrc: Oral  PainSc:                  Nolon Nations

## 2021-08-06 NOTE — Consult Note (Signed)
Reason for Consult:Right hip fx Referring Physician: Phillips Climes Time called: 1610 Time at bedside: [name] is an 76 y.o. female.  HPI: Ariel Cobb was walking up her driveway and tripped and fell. She had immediate pain in her right hip and could not get up. She managed to call for help and was brought to the ED for evaluation. X-rays showed a right hip fx and orthopedic surgery was consulted. She lives at home alone and usually ambulates with a cane.  Past Medical History:  Diagnosis Date   Anemia    slight told feb dr polite   Arthritis    Asthma    Chronic bronchitis ([place])    Chronic kidney disease    sees dr sanford yearly stage 3 stable   Claudication ([place])    Complication of anesthesia 2000   slow to awaken after hip surgery   Hyperlipidemia    Hypertension    Hypothyroidism    Osteoporosis    PAD (peripheral artery disease) ([place])    a. 07/29/13: s/p PV angiogram with successful diamondback orbital rotational atherectomy of the high-grade calcified R popliteal artery stenosis   Shortness of breath    on exertion   Thyroid disease    Type II diabetes mellitus ([place])     Past Surgical History:  Procedure Laterality Date   ABDOMINAL AORTAGRAM  06/04/2013   Procedure: ABDOMINAL [name];  Surgeon: Lorretta Harp, MD;  Location: [facility] CATH LAB;  Service: Cardiovascular;;   ATHERECTOMY Right 07/29/2013   popliteal/notes 07/29/2013   CHOLECYSTECTOMY  1980's?   COLONOSCOPY WITH PROPOFOL N/A 09/06/2016   Procedure: COLONOSCOPY WITH PROPOFOL;  Surgeon: Garlan Fair, MD;  Location: [facility] ENDOSCOPY;  Service: Endoscopy;  Laterality: N/A;   cyst from right hand     ESOPHAGOGASTRODUODENOSCOPY (EGD) WITH PROPOFOL N/A 09/06/2016   Procedure: ESOPHAGOGASTRODUODENOSCOPY (EGD) WITH PROPOFOL;  Surgeon: Garlan Fair, MD;  Location: [facility] ENDOSCOPY;  Service: Endoscopy;  Laterality: N/A;   HIP FRACTURE SURGERY Left 2000's   "I broke the ball; dr broke femur during OR; got a rod  in there" 92/07/2013)   LOWER EXTREMITY ANGIOGRAM N/A 06/04/2013   Procedure: LOWER EXTREMITY ANGIOGRAM;  Surgeon: Lorretta Harp, MD;  Location: [facility] CATH LAB;  Service: Cardiovascular;  Laterality: N/A;    Family History  Problem Relation Age of Onset   Heart disease Father     Social History:  reports that she has been smoking cigarettes. She has a 50.00 pack-year smoking history. She has never used smokeless tobacco. She reports that she does not drink alcohol and does not use drugs.  Allergies:  Allergies  Allergen Reactions   Adhesive [Tape] Itching    Surgical Tape - "makes me itch and break me out" -- HAS TO USE PAPER TAPE   Amoxicillin Hives, Itching and Swelling   Latex Itching   Other Other (See Comments)   Pioglitazone Other (See Comments)    Medications: I have reviewed the patient's current medications.  Results for orders placed or performed during the hospital encounter of 08/05/21 (from the past 48 hour(s))  Basic metabolic panel     Status: Abnormal   Collection Time: 08/05/21  6:47 PM  Result Value Ref Range   Sodium 136 135 - 145 mmol/L   Potassium 3.8 3.5 - 5.1 mmol/L   Chloride 99 98 - 111 mmol/L   CO2 27 22 - 32 mmol/L   Glucose, Bld 226 (H) 70 - 99 mg/dL  Comment: Glucose reference range applies only to samples taken after fasting for at least 8 hours.   BUN 43 (H) 8 - 23 mg/dL   Creatinine, Ser 1.60 (H) 0.44 - 1.00 mg/dL   Calcium 9.1 8.9 - 10.3 mg/dL   GFR, Estimated 33 (L) >60 mL/min    Comment: (NOTE) Calculated using the CKD-EPI Creatinine Equation (2021)    Anion gap 10 5 - 15    Comment: Performed at [facility] [address] [place], [place] [address]  CBC WITH DIFFERENTIAL     Status: Abnormal   Collection Time: 08/05/21  6:47 PM  Result Value Ref Range   WBC 12.0 (H) 4.0 - 10.5 K/uL   RBC 3.48 (L) 3.87 - 5.11 MIL/uL   Hemoglobin 11.3 (L) 12.0 - 15.0 g/dL   HCT 33.8 (L) 36.0 - 46.0 %   MCV 97.1 80.0 - 100.0 fL   MCH 32.5  26.0 - 34.0 pg   MCHC 33.4 30.0 - 36.0 g/dL   RDW 13.5 11.5 - 15.5 %   Platelets 307 150 - 400 K/uL   nRBC 0.0 0.0 - 0.2 %   Neutrophils Relative % 83 %   Neutro Abs 9.9 (H) 1.7 - 7.7 K/uL   Lymphocytes Relative 9 %   Lymphs Abs 1.1 0.7 - 4.0 K/uL   Monocytes Relative 7 %   Monocytes Absolute 0.8 0.1 - 1.0 K/uL   Eosinophils Relative 0 %   Eosinophils Absolute 0.0 0.0 - 0.5 K/uL   Basophils Relative 0 %   Basophils Absolute 0.1 0.0 - 0.1 K/uL   Immature Granulocytes 1 %   Abs Immature Granulocytes 0.06 0.00 - 0.07 K/uL    Comment: Performed at [facility], [address], [place], [place] [address]  Protime-INR     Status: Abnormal   Collection Time: 08/05/21  6:47 PM  Result Value Ref Range   Prothrombin Time 15.4 (H) 11.4 - 15.2 seconds   INR 1.2 0.8 - 1.2    Comment: (NOTE) INR goal varies based on device and disease states. Performed at [facility], [place] [address], [place], [place] [address]   Type and screen [place]     Status: None   Collection Time: 08/05/21  7:58 PM  Result Value Ref Range   ABO/RH(D) O NEG    Antibody Screen NEG    Sample Expiration      08/08/2021,2359 Performed at [facility], [place] [address], [place], [place] [address]   Resp Panel by RT-PCR (Flu A&B, Covid) Nasopharyngeal Swab     Status: None   Collection Time: 08/05/21 10:49 PM   Specimen: Nasopharyngeal Swab; Nasopharyngeal(NP) swabs in vial transport medium  Result Value Ref Range   SARS Coronavirus 2 by RT PCR NEGATIVE NEGATIVE    Comment: (NOTE) SARS-CoV-2 target nucleic acids are NOT DETECTED.  The SARS-CoV-2 RNA is generally detectable in upper respiratory specimens during the acute phase of infection. The lowest concentration of SARS-CoV-2 viral copies this assay can detect is 138 copies/mL. A negative result does not preclude SARS-Cov-2 infection and should not be used as the sole basis for treatment or other patient management  decisions. A negative result may occur with  improper specimen collection/handling, submission of specimen other than nasopharyngeal swab, presence of viral mutation(s) within the areas targeted by this assay, and inadequate number of viral copies(<138 copies/mL). A negative result must be combined with clinical observations, patient history, and  epidemiological information. The expected result is Negative.  Fact Sheet for Patients:  EntrepreneurPulse.com.au  Fact Sheet for Healthcare Providers:  IncredibleEmployment.be  This test is no t yet approved or cleared by the [place] FDA and  has been authorized for detection and/or diagnosis of SARS-CoV-2 by FDA under an Emergency Use Authorization (EUA). This EUA will remain  in effect (meaning this test can be used) for the duration of the COVID-19 declaration under Section 564(b)(1) of the Act, 21 U.S.C.section 360bbb-3(b)(1), unless the authorization is terminated  or revoked sooner.       Influenza A by PCR NEGATIVE NEGATIVE   Influenza B by PCR NEGATIVE NEGATIVE    Comment: (NOTE) The Xpert Xpress SARS-CoV-2/FLU/RSV plus assay is intended as an aid in the diagnosis of influenza from Nasopharyngeal swab specimens and should not be used as a sole basis for treatment. Nasal washings and aspirates are unacceptable for Xpert Xpress SARS-CoV-2/FLU/RSV testing.  Fact Sheet for Patients: EntrepreneurPulse.com.au  Fact Sheet for Healthcare Providers: IncredibleEmployment.be  This test is not yet approved or cleared by the [place] FDA and has been authorized for detection and/or diagnosis of SARS-CoV-2 by FDA under an Emergency Use Authorization (EUA). This EUA will remain in effect (meaning this test can be used) for the duration of the COVID-19 declaration under Section 564(b)(1) of the Act, 21 U.S.C. section 360bbb-3(b)(1), unless the authorization  is terminated or revoked.  Performed at [facility], [place] [address], [place], [place] [address]   ABO/Rh     Status: None   Collection Time: 08/06/21  2:10 AM  Result Value Ref Range   ABO/RH(D)      O NEG Performed at [facility] [address], [place], [place] [address]   Hemoglobin A1c     Status: Abnormal   Collection Time: 08/06/21  2:10 AM  Result Value Ref Range   Hgb A1c MFr Bld 6.1 (H) 4.8 - 5.6 %    Comment: (NOTE) Pre diabetes:          5.7%-6.4%  Diabetes:              >6.4%  Glycemic control for   <7.0% adults with diabetes    Mean Plasma Glucose 128.37 mg/dL    Comment: Performed at [facility] [address] [place], [place] [address]  Comprehensive metabolic panel     Status: Abnormal   Collection Time: 08/06/21  2:10 AM  Result Value Ref Range   Sodium 135 135 - 145 mmol/L   Potassium 4.0 3.5 - 5.1 mmol/L   Chloride 99 98 - 111 mmol/L   CO2 26 22 - 32 mmol/L   Glucose, Bld 265 (H) 70 - 99 mg/dL    Comment: Glucose reference range applies only to samples taken after fasting for at least 8 hours.   BUN 43 (H) 8 - 23 mg/dL   Creatinine, Ser 1.55 (H) 0.44 - 1.00 mg/dL   Calcium 9.1 8.9 - 10.3 mg/dL   Total Protein 6.4 (L) 6.5 - 8.1 g/dL   Albumin 3.2 (L) 3.5 - 5.0 g/dL   AST 18 15 - 41 U/L   ALT 15 0 - 44 U/L   Alkaline Phosphatase 58 38 - 126 U/L   Total Bilirubin 0.6 0.3 - 1.2 mg/dL   GFR, Estimated 34 (L) >60 mL/min    Comment: (NOTE) Calculated using the CKD-EPI Creatinine Equation (2021)    Anion gap 10 5 - 15  Comment: Performed at [facility], [place] [address], [place], [place] [address]  Magnesium     Status: None   Collection Time: 08/06/21  2:10 AM  Result Value Ref Range   Magnesium 1.8 1.7 - 2.4 mg/dL    Comment: Performed at [facility] [address] [place], [place] [address]  CBC WITH DIFFERENTIAL     Status: Abnormal   Collection Time: 08/06/21  2:10 AM  Result Value Ref Range   WBC 12.5  (H) 4.0 - 10.5 K/uL   RBC 3.17 (L) 3.87 - 5.11 MIL/uL   Hemoglobin 10.3 (L) 12.0 - 15.0 g/dL   HCT 30.6 (L) 36.0 - 46.0 %   MCV 96.5 80.0 - 100.0 fL   MCH 32.5 26.0 - 34.0 pg   MCHC 33.7 30.0 - 36.0 g/dL   RDW 13.3 11.5 - 15.5 %   Platelets 274 150 - 400 K/uL   nRBC 0.0 0.0 - 0.2 %   Neutrophils Relative % 87 %   Neutro Abs 10.8 (H) 1.7 - 7.7 K/uL   Lymphocytes Relative 5 %   Lymphs Abs 0.6 (L) 0.7 - 4.0 K/uL   Monocytes Relative 8 %   Monocytes Absolute 0.9 0.1 - 1.0 K/uL   Eosinophils Relative 0 %   Eosinophils Absolute 0.0 0.0 - 0.5 K/uL   Basophils Relative 0 %   Basophils Absolute 0.0 0.0 - 0.1 K/uL   Immature Granulocytes 0 %   Abs Immature Granulocytes 0.05 0.00 - 0.07 K/uL    Comment: Performed at [facility], [address], [place], [place] [address]  VITAMIN D 25 Hydroxy (Vit-D Deficiency, Fractures)     Status: None   Collection Time: 08/06/21  2:10 AM  Result Value Ref Range   Vit D, 25-Hydroxy 45.01 30 - 100 ng/mL    Comment: (NOTE) Vitamin D deficiency has been defined by the [place] practice guideline as a level of serum 25-OH  vitamin D less than 20 ng/mL (1,2). The Endocrine Society went on to  further define vitamin D insufficiency as a level between 21 and 29  ng/mL (2).  1. IOM (Institute of Medicine). 2010. Dietary reference intakes for  calcium and D. [place]: The [employer]. 2. Holick MF, [place], Bischoff-Ferrari HA, et al. Evaluation,  treatment, and prevention of vitamin D deficiency: an Endocrine  Society clinical practice guideline, JCEM. 2011 Jul; 96(7): 1911-30.  Performed at [facility], [place] [address] [place], [place] [address]   CBG monitoring, ED     Status: Abnormal   Collection Time: 08/06/21  7:53 AM  Result Value Ref Range   Glucose-Capillary 228 (H) 70 - 99 mg/dL    Comment: Glucose reference range applies only to samples taken after fasting for at least  8 hours.    DG Chest 1 View  Result Date: 08/05/2021 CLINICAL DATA:  Fall with hip fracture EXAM: CHEST  1 VIEW COMPARISON:  10/11/2015 FINDINGS: Hyperinflation. No pleural effusion or pneumothorax. Normal cardiac size. Possible partial atelectasis of left lower lobe. Aortic atherosclerosis. Old appearing bilateral rib fractures. IMPRESSION: Possible partial atelectasis at the left lower lobe, suggest two-view chest follow-up. Electronically Signed   By: Donavan Foil M.D.   On: 08/05/2021 19:41   CT Hip Right Wo Contrast  Result Date: 08/05/2021 CLINICAL DATA:  Right hip fracture, operative planning EXAM: CT OF THE RIGHT HIP WITHOUT CONTRAST TECHNIQUE: Multidetector CT  imaging of the right hip was performed according to the standard protocol. Multiplanar CT image reconstructions were also generated. RADIATION DOSE REDUCTION: This exam was performed according to the departmental dose-optimization program which includes automated exposure control, adjustment of the mA and/or kV according to patient size and/or use of iterative reconstruction technique. COMPARISON:  Right hip radiographs dated 08/05/2021 FINDINGS: Impacted, mildly comminuted, intertrochanteric right hip fracture. Mild degenerative changes of the right hip. Visualized bony pelvis is intact. Mild intramuscular hemorrhage overlying the greater trochanter. Visualized soft tissues are notable for calcified uterine fibroids. IMPRESSION: Impacted, mildly comminuted, intertrochanteric right hip fracture. Electronically Signed   By: Julian Hy M.D.   On: 08/05/2021 22:36   DG Hip Unilat With Pelvis 2-3 Views Right  Result Date: 08/05/2021 CLINICAL DATA:  Right hip fracture EXAM: DG HIP (WITH OR WITHOUT PELVIS) 2-3V RIGHT COMPARISON:  None. FINDINGS: There is an acute intratrochanteric, impacted right femoral neck fracture with mild anterior angulation of the distal fracture fragment and probable avulsion of the greater trochanter. The femoral  head is still seated within the right acetabulum. There is severe superimposed right hip degenerative arthritis. Left hip bipolar hemiarthroplasty has been performed. Multiple involuted fibroids noted within the pelvis. Advanced vascular calcifications are seen. IMPRESSION: Acute intratrochanteric fracture of the right hip. Superimposed severe right hip degenerative arthritis. Electronically Signed   By: Fidela Salisbury M.D.   On: 08/05/2021 21:13   DG FEMUR, MIN 2 VIEWS RIGHT  Result Date: 08/05/2021 CLINICAL DATA:  Fall EXAM: RIGHT FEMUR 2 VIEWS COMPARISON:  None. FINDINGS: Vascular calcifications. Acute comminuted and impacted appearing proximal femoral fracture, appears to involve the basicervical region and trochanter. No femoral head dislocation. IMPRESSION: Acute comminuted and impacted proximal femoral fracture appears to involve the intertrochanteric region and basicervical region. Electronically Signed   By: Donavan Foil M.D.   On: 08/05/2021 19:40    Review of Systems  HENT:  Negative for ear discharge, ear pain, hearing loss and tinnitus.   Eyes:  Negative for photophobia and pain.  Respiratory:  Negative for cough and shortness of breath.   Cardiovascular:  Negative for chest pain.  Gastrointestinal:  Negative for abdominal pain, nausea and vomiting.  Genitourinary:  Negative for dysuria, flank pain, frequency and urgency.  Musculoskeletal:  Positive for arthralgias (Right hip). Negative for back pain, myalgias and neck pain.  Neurological:  Negative for dizziness and headaches.  Hematological:  Does not bruise/bleed easily.  Psychiatric/Behavioral:  The patient is not nervous/anxious.   Blood pressure (!) 147/55, pulse 74, temperature 98.4 F (36.9 C), temperature source Oral, resp. rate 19, SpO2 100 %. Physical Exam Constitutional:      General: She is not in acute distress.    Appearance: She is well-developed. She is not diaphoretic.  HENT:     Head: Normocephalic and  atraumatic.  Eyes:     General: No scleral icterus.       Right eye: No discharge.        Left eye: No discharge.     Conjunctiva/sclera: Conjunctivae normal.  Cardiovascular:     Rate and Rhythm: Normal rate and regular rhythm.  Pulmonary:     Effort: Pulmonary effort is normal. No respiratory distress.  Musculoskeletal:     Cervical back: Normal range of motion.     Comments: RLE No traumatic wounds, ecchymosis, or rash  Mild TTP hip  No knee or ankle effusion  Knee stable to varus/ valgus and anterior/posterior stress  Sens DPN, SPN, TN  intact  Motor EHL, ext, flex, evers 5/5  DP 1+, PT 0, No significant edema  Skin:    General: Skin is warm and dry.  Neurological:     Mental Status: She is alert.  Psychiatric:        Mood and Affect: Mood normal.        Behavior: Behavior normal.    Assessment/Plan: Right hip fx -- Plan IMN today with Dr. Doreatha Martin vs tomorrow with Dr. Zachery Dakins. Please keep NPO for now. Multiple medical problems including nicotine dependence, COPD, hypothyroidism, insulin dependent diabetes mellitus type 2, kidney disease stage IIIb, atrial fibrillation on Eliquis, hypertension, and hyperlipidemia -- per primary service    Lisette Abu, PA-C Orthopedic Surgery [phone] 08/06/2021, 9:50 AM

## 2021-08-06 NOTE — Op Note (Signed)
Orthopaedic Surgery Operative Note (CSN: [id number] ) Date of Surgery: 08/06/2021  Admit Date: 08/05/2021   Diagnoses: Pre-Op Diagnoses: Right intertrochanteric femur fracture  Post-Op Diagnosis: Same  Procedures: CPT 27245-Cephalomedullary nailing of right intertrochanteric femur fracture  Surgeons : Primary: Shona Needles, MD  Assistant: Patrecia Pace, PA-C  Location: OR 6   Anesthesia:General   Antibiotics: Ancef 2g preop   Tourniquet time:None    Estimated Blood IHKV:42 mL  Complications:None  Specimens:None   Implants: Implant Name Type Inv. Item Serial No. Manufacturer Lot No. LRB No. Used Action  NAIL INTERTAN 10X18 130D 10S - [id number] Nail NAIL INTERTAN 10X18 130D 10S  [employer] [id number] Right 1 Implanted  SCREW LAG COMPR KIT 95/90 - [id number] Screw SCREW LAG COMPR KIT 95/90  [employer] [id number] Right 1 Implanted  SCREW TRIGEN LOW PROF 5.0X35 - [id number] Screw SCREW TRIGEN LOW PROF 5.0X35  [employer] [id number] Right 1 Implanted     Indications for Surgery: 76 year old female who sustained a ground-level fall.  She has a right intertrochanteric femur fracture.  Due to the unstable nature of her injury I recommended proceeding with cephalomedullary nailing of the right hip.  Risks and benefits were discussed with the patient.  Risks included but not limited to bleeding, infection, malunion, nonunion, hardware failure, hardware irritation, nerve and blood vessel injury, DVT, even the possibility anesthetic complications.  She agreed to proceed with surgery and consent was obtained.  Operative Findings: Cephalomedullary nailing of right intertrochanteric femur fracture using [employer] InterTAN 10 x 180 mm nail with a 95 mm lag screw and 90 mm compression screw  Procedure: The patient was identified in the preoperative holding area. Consent was confirmed with the patient and their family and all questions  were answered. The operative extremity was marked after confirmation with the patient. she was then brought back to the operating room by our anesthesia colleagues.  She was placed under general anesthetic and carefully transferred over to the [facility] table.  All bony prominences were well-padded.  Traction was applied to the right lower extremity and fluoroscopic imaging was obtained.  Adequate reduction was then maintained.  The right lower extremity was then prepped and draped in usual sterile fashion.  A timeout was performed to verify the patient, the procedure, and the extremity.  Preoperative antibiotics were dosed.  Small incision proximal to the greater trochanter was made and carried down through skin and subcutaneous tissue.  A threaded guidewire was placed at the tip of the greater trochanter and advanced into the proximal metaphysis.  An entry reamer was used to enter the medullary canal.  I then passed a 10 x 180 mm InterTAN nail down the center of the canal attached to a targeting arm.  I did place a small incision along the inferior femoral neck and used a bone hook to provide some reduction of the inferior femoral neck and calcar.  I then directed a threaded guidewire into the head/neck segment of the femur.  I confirmed adequate tip apex distance with fluoroscopy.  I then measured the length and chose to use a 95 mm lag screw.  I then drilled the path for the compression screw.  I placed an antirotation bar.  I then drilled the path for the lag screw and placed the 95 mm lag screw.  I then placed the compression screw and obtained approximately 5 mm of compression.  I then statically locked the nail.  I then drilled  and placed a distal interlocking screw through the targeting arm.  Final fluoroscopic imaging was obtained.  The incisions were copiously irrigated.  A layered closure of 2-0 Vicryl and 3-0 Monocryl with Dermabond was used to close the skin.  Sterile dressings were placed.  The patient  was then awoken from anesthesia and taken to the PACU in stable condition.   Post Op Plan/Instructions: The patient will be weightbearing as tolerated to the right lower extremity.  She will receive postoperative Ancef.  She will be restarted on her Eliquis once her hemoglobin is stabilized.  We will have her mobilize with physical and Occupational Therapy.  I was present and performed the entire surgery.  Patrecia Pace, PA-C did assist me throughout the case. An assistant was necessary given the difficulty in approach, maintenance of reduction and ability to instrument the fracture.   Katha Hamming, MD [facility]

## 2021-08-06 NOTE — Anesthesia Preprocedure Evaluation (Addendum)
Anesthesia Evaluation  Patient identified by MRN, date of birth, ID band Patient awake    Reviewed: Allergy & Precautions, Patient's Chart, lab work & pertinent test results  Airway Mallampati: II  TM Distance: >3 FB     Dental   Pulmonary COPD,  COPD inhaler, Current Smoker,    breath sounds clear to auscultation       Cardiovascular hypertension, Pt. on home beta blockers + Peripheral Vascular Disease   Rhythm:Regular Rate:Normal  12/2018 Myo-perfusion scan ? The left ventricular ejection fraction is normal (55-65%). ? Nuclear stress EF: 62%. ? There was no ST segment deviation noted during stress. ? The study is normal. ? This is a low risk study. ? No change compared to prior study.   12/2018 TTE IMPRESSIONS    1. The left ventricle has normal systolic function with an ejection  fraction of 60-65%. The cavity size was normal. There is mildly increased  left ventricular wall thickness. Left ventricular diastolic Doppler  parameters are consistent with impaired  relaxation. Elevated left atrial and left ventricular end-diastolic  pressures The E/e' is >15. No evidence of left ventricular regional wall  motion abnormalities.  2. The right ventricle has normal systolic function. The cavity was  normal. There is no increase in right ventricular wall thickness.  3. The mitral valve is grossly normal.  4. The tricuspid valve is grossly normal.  5. The aortic valve is tricuspid. Mild sclerosis of the aortic valve. No  stenosis of the aortic valve.  6. The aorta is normal in size and structure.    Neuro/Psych negative neurological ROS     GI/Hepatic negative GI ROS, Neg liver ROS,   Endo/Other  diabetesHypothyroidism   Renal/GU Renal InsufficiencyRenal disease     Musculoskeletal negative musculoskeletal ROS (+)   Abdominal   Peds  Hematology  (+) Blood dyscrasia, anemia ,   Anesthesia Other Findings    Reproductive/Obstetrics                            Anesthesia Physical Anesthesia Plan  ASA: 3  Anesthesia Plan: General   Post-op Pain Management: Tylenol PO (pre-op)   Induction:   PONV Risk Score and Plan: 2 and Ondansetron and Dexamethasone  Airway Management Planned: Oral ETT  Additional Equipment:   Intra-op Plan:   Post-operative Plan:   Informed Consent: I have reviewed the patients History and Physical, chart, labs and discussed the procedure including the risks, benefits and alternatives for the proposed anesthesia with the patient or authorized representative who has indicated his/her understanding and acceptance.     Dental advisory given  Plan Discussed with: CRNA, Anesthesiologist and Surgeon  Anesthesia Plan Comments:       Anesthesia Quick Evaluation

## 2021-08-06 NOTE — Anesthesia Preprocedure Evaluation (Deleted)
Anesthesia Evaluation    Airway        Dental   Pulmonary Current Smoker,           Cardiovascular hypertension,      Neuro/Psych    GI/Hepatic   Endo/Other  diabetes  Renal/GU      Musculoskeletal   Abdominal   Peds  Hematology   Anesthesia Other Findings   Reproductive/Obstetrics                             Anesthesia Physical Anesthesia Plan Anesthesia Quick Evaluation

## 2021-08-06 NOTE — Plan of Care (Signed)

## 2021-08-06 NOTE — Assessment & Plan Note (Signed)
   No evidence of COPD exacerbation this time  Continue home regimen of maintenance inhalers  As needed bronchodilator therapy for episodic shortness of breath and wheezing.  

## 2021-08-06 NOTE — Anesthesia Procedure Notes (Signed)
Procedure Name: Intubation Date/Time: 08/06/2021 5:20 PM Performed by: Babs Bertin, CRNA Pre-anesthesia Checklist: Patient identified, Emergency Drugs available, Suction available and Patient being monitored Patient Re-evaluated:Patient Re-evaluated prior to induction Oxygen Delivery Method: Circle System Utilized Preoxygenation: Pre-oxygenation with 100% oxygen Induction Type: IV induction Ventilation: Mask ventilation without difficulty Laryngoscope Size: Mac and 3 Grade View: Grade I Tube type: Oral Tube size: 7.0 mm Number of attempts: 1 Airway Equipment and Method: Stylet and Oral airway Placement Confirmation: ETT inserted through vocal cords under direct vision, positive ETCO2 and breath sounds checked- equal and bilateral Secured at: 21 cm Tube secured with: Tape Dental Injury: Teeth and Oropharynx as per pre-operative assessment

## 2021-08-06 NOTE — H&P (View-Only) (Signed)
Reason for Consult:Right hip fx Referring Physician: Phillips Climes Time called: [id number] Time at bedside: Timnath is an 76 y.o. female.  HPI: Ariel Cobb was walking up her driveway and tripped and fell. She had immediate pain in her right hip and could not get up. She managed to call for help and was brought to the ED for evaluation. X-rays showed a right hip fx and orthopedic surgery was consulted. She lives at home alone and usually ambulates with a cane.  Past Medical History:  Diagnosis Date   Anemia    slight told feb dr polite   Arthritis    Asthma    Chronic bronchitis ([place])    Chronic kidney disease    sees dr sanford yearly stage 3 stable   Claudication ([place])    Complication of anesthesia 2000   slow to awaken after hip surgery   Hyperlipidemia    Hypertension    Hypothyroidism    Osteoporosis    PAD (peripheral artery disease) ([place])    a. 07/29/13: s/p PV angiogram with successful diamondback orbital rotational atherectomy of the high-grade calcified R popliteal artery stenosis   Shortness of breath    on exertion   Thyroid disease    Type II diabetes mellitus ([place])     Past Surgical History:  Procedure Laterality Date   ABDOMINAL AORTAGRAM  06/04/2013   Procedure: ABDOMINAL [name];  Surgeon: Lorretta Harp, MD;  Location[facility] LAB;  Service: Cardiovascular;;   ATHERECTOMY Right 07/29/2013   popliteal/notes 07/29/2013   CHOLECYSTECTOMY  1980's?   COLONOSCOPY WITH PROPOFOL N/A 09/06/2016   Procedure: COLONOSCOPY WITH PROPOFOL;  Surgeon: Garlan Fair, MD;  Location: [facility] ENDOSCOPY;  Service: Endoscopy;  Laterality: N/A;   cyst from right hand     ESOPHAGOGASTRODUODENOSCOPY (EGD) WITH PROPOFOL N/A 09/06/2016   Procedure: ESOPHAGOGASTRODUODENOSCOPY (EGD) WITH PROPOFOL;  Surgeon: Garlan Fair, MD;  Location: [facility] ENDOSCOPY;  Service: Endoscopy;  Laterality: N/A;   HIP FRACTURE SURGERY Left 2000's   "I broke the ball; dr broke femur during OR; got a rod  in there" 92/07/2013)   LOWER EXTREMITY ANGIOGRAM N/A 06/04/2013   Procedure: LOWER EXTREMITY ANGIOGRAM;  Surgeon: Lorretta Harp, MD;  Location: [facility] CATH LAB;  Service: Cardiovascular;  Laterality: N/A;    Family History  Problem Relation Age of Onset   Heart disease Father     Social History:  reports that she has been smoking cigarettes. She has a 50.00 pack-year smoking history. She has never used smokeless tobacco. She reports that she does not drink alcohol and does not use drugs.  Allergies:  Allergies  Allergen Reactions   Adhesive [Tape] Itching    Surgical Tape - "makes me itch and break me out" -- HAS TO USE PAPER TAPE   Amoxicillin Hives, Itching and Swelling   Latex Itching   Other Other (See Comments)   Pioglitazone Other (See Comments)    Medications: I have reviewed the patient's current medications.  Results for orders placed or performed during the hospital encounter of 08/05/21 (from the past 48 hour(s))  Basic metabolic panel     Status: Abnormal   Collection Time: 08/05/21  6:47 PM  Result Value Ref Range   Sodium 136 135 - 145 mmol/L   Potassium 3.8 3.5 - 5.1 mmol/L   Chloride 99 98 - 111 mmol/L   CO2 27 22 - 32 mmol/L   Glucose, Bld 226 (H) 70 - 99 mg/dL  Comment: Glucose reference range applies only to samples taken after fasting for at least 8 hours.   BUN 43 (H) 8 - 23 mg/dL   Creatinine, Ser 1.60 (H) 0.44 - 1.00 mg/dL   Calcium 9.1 8.9 - 10.3 mg/dL   GFR, Estimated 33 (L) >60 mL/min    Comment: (NOTE) Calculated using the CKD-EPI Creatinine Equation (2021)    Anion gap 10 5 - 15    Comment: Performed at [facility] [address] [place], [place] [address]  CBC WITH DIFFERENTIAL     Status: Abnormal   Collection Time: 08/05/21  6:47 PM  Result Value Ref Range   WBC 12.0 (H) 4.0 - 10.5 K/uL   RBC 3.48 (L) 3.87 - 5.11 MIL/uL   Hemoglobin 11.3 (L) 12.0 - 15.0 g/dL   HCT 33.8 (L) 36.0 - 46.0 %   MCV 97.1 80.0 - 100.0 fL   MCH 32.5  26.0 - 34.0 pg   MCHC 33.4 30.0 - 36.0 g/dL   RDW 13.5 11.5 - 15.5 %   Platelets 307 150 - 400 K/uL   nRBC 0.0 0.0 - 0.2 %   Neutrophils Relative % 83 %   Neutro Abs 9.9 (H) 1.7 - 7.7 K/uL   Lymphocytes Relative 9 %   Lymphs Abs 1.1 0.7 - 4.0 K/uL   Monocytes Relative 7 %   Monocytes Absolute 0.8 0.1 - 1.0 K/uL   Eosinophils Relative 0 %   Eosinophils Absolute 0.0 0.0 - 0.5 K/uL   Basophils Relative 0 %   Basophils Absolute 0.1 0.0 - 0.1 K/uL   Immature Granulocytes 1 %   Abs Immature Granulocytes 0.06 0.00 - 0.07 K/uL    Comment: Performed at [facility], [address] [place], [place] [address]  Protime-INR     Status: Abnormal   Collection Time: 08/05/21  6:47 PM  Result Value Ref Range   Prothrombin Time 15.4 (H) 11.4 - 15.2 seconds   INR 1.2 0.8 - 1.2    Comment: (NOTE) INR goal varies based on device and disease states. Performed at [facility], [place] [address], [place], [place] [address]   Type and screen [place]     Status: None   Collection Time: 08/05/21  7:58 PM  Result Value Ref Range   ABO/RH(D) O NEG    Antibody Screen NEG    Sample Expiration      08/08/2021,2359 Performed at [facility], [place] [address] [place], [place] [address]   Resp Panel by RT-PCR (Flu A&B, Covid) Nasopharyngeal Swab     Status: None   Collection Time: 08/05/21 10:49 PM   Specimen: Nasopharyngeal Swab; Nasopharyngeal(NP) swabs in vial transport medium  Result Value Ref Range   SARS Coronavirus 2 by RT PCR NEGATIVE NEGATIVE    Comment: (NOTE) SARS-CoV-2 target nucleic acids are NOT DETECTED.  The SARS-CoV-2 RNA is generally detectable in upper respiratory specimens during the acute phase of infection. The lowest concentration of SARS-CoV-2 viral copies this assay can detect is 138 copies/mL. A negative result does not preclude SARS-Cov-2 infection and should not be used as the sole basis for treatment or other patient management  decisions. A negative result may occur with  improper specimen collection/handling, submission of specimen other than nasopharyngeal swab, presence of viral mutation(s) within the areas targeted by this assay, and inadequate number of viral copies(<138 copies/mL). A negative result must be combined with clinical observations, patient history, and  epidemiological information. The expected result is Negative.  Fact Sheet for Patients:  EntrepreneurPulse.com.au  Fact Sheet for Healthcare Providers:  IncredibleEmployment.be  This test is no t yet approved or cleared by the [place] FDA and  has been authorized for detection and/or diagnosis of SARS-CoV-2 by FDA under an Emergency Use Authorization (EUA). This EUA will remain  in effect (meaning this test can be used) for the duration of the COVID-19 declaration under Section 564(b)(1) of the Act, 21 U.S.C.section 360bbb-3(b)(1), unless the authorization is terminated  or revoked sooner.       Influenza A by PCR NEGATIVE NEGATIVE   Influenza B by PCR NEGATIVE NEGATIVE    Comment: (NOTE) The Xpert Xpress SARS-CoV-2/FLU/RSV plus assay is intended as an aid in the diagnosis of influenza from Nasopharyngeal swab specimens and should not be used as a sole basis for treatment. Nasal washings and aspirates are unacceptable for Xpert Xpress SARS-CoV-2/FLU/RSV testing.  Fact Sheet for Patients: EntrepreneurPulse.com.au  Fact Sheet for Healthcare Providers: IncredibleEmployment.be  This test is not yet approved or cleared by the [place] FDA and has been authorized for detection and/or diagnosis of SARS-CoV-2 by FDA under an Emergency Use Authorization (EUA). This EUA will remain in effect (meaning this test can be used) for the duration of the COVID-19 declaration under Section 564(b)(1) of the Act, 21 U.S.C. section 360bbb-3(b)(1), unless the authorization  is terminated or revoked.  Performed at [facility], [place] [address], [place], [place] [address]   ABO/Rh     Status: None   Collection Time: 08/06/21  2:10 AM  Result Value Ref Range   ABO/RH(D)      O NEG Performed at [facility] [address], [place], [place] [address]   Hemoglobin A1c     Status: Abnormal   Collection Time: 08/06/21  2:10 AM  Result Value Ref Range   Hgb A1c MFr Bld 6.1 (H) 4.8 - 5.6 %    Comment: (NOTE) Pre diabetes:          5.7%-6.4%  Diabetes:              >6.4%  Glycemic control for   <7.0% adults with diabetes    Mean Plasma Glucose 128.37 mg/dL    Comment: Performed at [facility] [address], [place], [place] [address]  Comprehensive metabolic panel     Status: Abnormal   Collection Time: 08/06/21  2:10 AM  Result Value Ref Range   Sodium 135 135 - 145 mmol/L   Potassium 4.0 3.5 - 5.1 mmol/L   Chloride 99 98 - 111 mmol/L   CO2 26 22 - 32 mmol/L   Glucose, Bld 265 (H) 70 - 99 mg/dL    Comment: Glucose reference range applies only to samples taken after fasting for at least 8 hours.   BUN 43 (H) 8 - 23 mg/dL   Creatinine, Ser 1.55 (H) 0.44 - 1.00 mg/dL   Calcium 9.1 8.9 - 10.3 mg/dL   Total Protein 6.4 (L) 6.5 - 8.1 g/dL   Albumin 3.2 (L) 3.5 - 5.0 g/dL   AST 18 15 - 41 U/L   ALT 15 0 - 44 U/L   Alkaline Phosphatase 58 38 - 126 U/L   Total Bilirubin 0.6 0.3 - 1.2 mg/dL   GFR, Estimated 34 (L) >60 mL/min    Comment: (NOTE) Calculated using the CKD-EPI Creatinine Equation (2021)    Anion gap 10 5 - 15  Comment: Performed at [facility], [place] [address], [place], [place] [address]  Magnesium     Status: None   Collection Time: 08/06/21  2:10 AM  Result Value Ref Range   Magnesium 1.8 1.7 - 2.4 mg/dL    Comment: Performed at [facility] [address], [place], [place] [address]  CBC WITH DIFFERENTIAL     Status: Abnormal   Collection Time: 08/06/21  2:10 AM  Result Value Ref Range   WBC 12.5  (H) 4.0 - 10.5 K/uL   RBC 3.17 (L) 3.87 - 5.11 MIL/uL   Hemoglobin 10.3 (L) 12.0 - 15.0 g/dL   HCT 30.6 (L) 36.0 - 46.0 %   MCV 96.5 80.0 - 100.0 fL   MCH 32.5 26.0 - 34.0 pg   MCHC 33.7 30.0 - 36.0 g/dL   RDW 13.3 11.5 - 15.5 %   Platelets 274 150 - 400 K/uL   nRBC 0.0 0.0 - 0.2 %   Neutrophils Relative % 87 %   Neutro Abs 10.8 (H) 1.7 - 7.7 K/uL   Lymphocytes Relative 5 %   Lymphs Abs 0.6 (L) 0.7 - 4.0 K/uL   Monocytes Relative 8 %   Monocytes Absolute 0.9 0.1 - 1.0 K/uL   Eosinophils Relative 0 %   Eosinophils Absolute 0.0 0.0 - 0.5 K/uL   Basophils Relative 0 %   Basophils Absolute 0.0 0.0 - 0.1 K/uL   Immature Granulocytes 0 %   Abs Immature Granulocytes 0.05 0.00 - 0.07 K/uL    Comment: Performed at [facility], [address], [place], [place] [address]  VITAMIN D 25 Hydroxy (Vit-D Deficiency, Fractures)     Status: None   Collection Time: 08/06/21  2:10 AM  Result Value Ref Range   Vit D, 25-Hydroxy 45.01 30 - 100 ng/mL    Comment: (NOTE) Vitamin D deficiency has been defined by the [place] practice guideline as a level of serum 25-OH  vitamin D less than 20 ng/mL (1,2). The Endocrine Society went on to  further define vitamin D insufficiency as a level between 21 and 29  ng/mL (2).  1. IOM (Institute of Medicine). 2010. Dietary reference intakes for  calcium and D. [place]: The [employer]. 2. Holick MF, Binkley [place], Bischoff-Ferrari HA, et al. Evaluation,  treatment, and prevention of vitamin D deficiency: an Endocrine  Society clinical practice guideline, JCEM. 2011 Jul; 96(7): 1911-30.  Performed at [facility], [place] [address], [place], [place] [address]   CBG monitoring, ED     Status: Abnormal   Collection Time: 08/06/21  7:53 AM  Result Value Ref Range   Glucose-Capillary 228 (H) 70 - 99 mg/dL    Comment: Glucose reference range applies only to samples taken after fasting for at least  8 hours.    DG Chest 1 View  Result Date: 08/05/2021 CLINICAL DATA:  Fall with hip fracture EXAM: CHEST  1 VIEW COMPARISON:  10/11/2015 FINDINGS: Hyperinflation. No pleural effusion or pneumothorax. Normal cardiac size. Possible partial atelectasis of left lower lobe. Aortic atherosclerosis. Old appearing bilateral rib fractures. IMPRESSION: Possible partial atelectasis at the left lower lobe, suggest two-view chest follow-up. Electronically Signed   By: Donavan Foil M.D.   On: 08/05/2021 19:41   CT Hip Right Wo Contrast  Result Date: 08/05/2021 CLINICAL DATA:  Right hip fracture, operative planning EXAM: CT OF THE RIGHT HIP WITHOUT CONTRAST TECHNIQUE: Multidetector CT  imaging of the right hip was performed according to the standard protocol. Multiplanar CT image reconstructions were also generated. RADIATION DOSE REDUCTION: This exam was performed according to the departmental dose-optimization program which includes automated exposure control, adjustment of the mA and/or kV according to patient size and/or use of iterative reconstruction technique. COMPARISON:  Right hip radiographs dated 08/05/2021 FINDINGS: Impacted, mildly comminuted, intertrochanteric right hip fracture. Mild degenerative changes of the right hip. Visualized bony pelvis is intact. Mild intramuscular hemorrhage overlying the greater trochanter. Visualized soft tissues are notable for calcified uterine fibroids. IMPRESSION: Impacted, mildly comminuted, intertrochanteric right hip fracture. Electronically Signed   By: Julian Hy M.D.   On: 08/05/2021 22:36   DG Hip Unilat With Pelvis 2-3 Views Right  Result Date: 08/05/2021 CLINICAL DATA:  Right hip fracture EXAM: DG HIP (WITH OR WITHOUT PELVIS) 2-3V RIGHT COMPARISON:  None. FINDINGS: There is an acute intratrochanteric, impacted right femoral neck fracture with mild anterior angulation of the distal fracture fragment and probable avulsion of the greater trochanter. The femoral  head is still seated within the right acetabulum. There is severe superimposed right hip degenerative arthritis. Left hip bipolar hemiarthroplasty has been performed. Multiple involuted fibroids noted within the pelvis. Advanced vascular calcifications are seen. IMPRESSION: Acute intratrochanteric fracture of the right hip. Superimposed severe right hip degenerative arthritis. Electronically Signed   By: Fidela Salisbury M.D.   On: 08/05/2021 21:13   DG FEMUR, MIN 2 VIEWS RIGHT  Result Date: 08/05/2021 CLINICAL DATA:  Fall EXAM: RIGHT FEMUR 2 VIEWS COMPARISON:  None. FINDINGS: Vascular calcifications. Acute comminuted and impacted appearing proximal femoral fracture, appears to involve the basicervical region and trochanter. No femoral head dislocation. IMPRESSION: Acute comminuted and impacted proximal femoral fracture appears to involve the intertrochanteric region and basicervical region. Electronically Signed   By: Donavan Foil M.D.   On: 08/05/2021 19:40    Review of Systems  HENT:  Negative for ear discharge, ear pain, hearing loss and tinnitus.   Eyes:  Negative for photophobia and pain.  Respiratory:  Negative for cough and shortness of breath.   Cardiovascular:  Negative for chest pain.  Gastrointestinal:  Negative for abdominal pain, nausea and vomiting.  Genitourinary:  Negative for dysuria, flank pain, frequency and urgency.  Musculoskeletal:  Positive for arthralgias (Right hip). Negative for back pain, myalgias and neck pain.  Neurological:  Negative for dizziness and headaches.  Hematological:  Does not bruise/bleed easily.  Psychiatric/Behavioral:  The patient is not nervous/anxious.   Blood pressure (!) 147/55, pulse 74, temperature 98.4 F (36.9 C), temperature source Oral, resp. rate 19, SpO2 100 %. Physical Exam Constitutional:      General: She is not in acute distress.    Appearance: She is well-developed. She is not diaphoretic.  HENT:     Head: Normocephalic and  atraumatic.  Eyes:     General: No scleral icterus.       Right eye: No discharge.        Left eye: No discharge.     Conjunctiva/sclera: Conjunctivae normal.  Cardiovascular:     Rate and Rhythm: Normal rate and regular rhythm.  Pulmonary:     Effort: Pulmonary effort is normal. No respiratory distress.  Musculoskeletal:     Cervical back: Normal range of motion.     Comments: RLE No traumatic wounds, ecchymosis, or rash  Mild TTP hip  No knee or ankle effusion  Knee stable to varus/ valgus and anterior/posterior stress  Sens DPN, SPN, TN  intact  Motor EHL, ext, flex, evers 5/5  DP 1+, PT 0, No significant edema  Skin:    General: Skin is warm and dry.  Neurological:     Mental Status: She is alert.  Psychiatric:        Mood and Affect: Mood normal.        Behavior: Behavior normal.    Assessment/Plan: Right hip fx -- Plan IMN today with Dr. Doreatha Martin vs tomorrow with Dr. Zachery Dakins. Please keep NPO for now. Multiple medical problems including nicotine dependence, COPD, hypothyroidism, insulin dependent diabetes mellitus type 2, kidney disease stage IIIb, atrial fibrillation on Eliquis, hypertension, and hyperlipidemia -- per primary service    Lisette Abu, PA-C Orthopedic Surgery [phone] 08/06/2021, 9:50 AM

## 2021-08-06 NOTE — Assessment & Plan Note (Signed)
Strict intake and output monitoring Creatinine near baseline Minimizing nephrotoxic agents as much as possible Serial chemistries to monitor renal function and electrolytes  

## 2021-08-06 NOTE — Consult Note (Signed)
WOC Nurse Consult Note: Reason for Consult: skin tear on right elbow, partial avulsion. Patient feel outside on gravel, on Eliquis. Wound type:trauma Pressure Injury POA: N/A Measurement:7cm x 4cm x 0.1cm Wound bed:red, moist Drainage (amount, consistency, odor) small serosanguinous on old dressing Periwound: bruising Dressing procedure/placement/frequency: I will provide Nursing with guidance for the topical care of the skin tear using a NS cleanse, wound contact layer of antimicrobial nonadherent gauze (xeroform) topped with an ABD pad for cushioning and securement with a few turns of Kerlix roll gauze/paper tape. This is to be changed daily.  A sacral prophylactic foam dressing is to be placed for PI prevention and heels are to be floated.   [place] nursing team will not follow, but will remain available to this patient, the nursing and medical teams.  Please re-consult if needed. Thanks, Maudie Flakes, MSN, RN, [place], Arther Abbott  Pager# [phone]

## 2021-08-06 NOTE — Assessment & Plan Note (Signed)
.   Continuing home regimen of lipid lowering therapy.  

## 2021-08-06 NOTE — Assessment & Plan Note (Signed)
.   Patient is being counseled daily on smoking cessation. . Providing patient with nicotine replacement therapy during this hospitalization.   

## 2021-08-06 NOTE — Assessment & Plan Note (Signed)
·   Patient suffered a mechanical fall resulting in a right intertrochanteric hip fracture as identified on x-ray and CT imaging  ER provider discussed case with Dr. Mable Fill with [facility] who will evaluate patient in consultation  Possible operative intervention on the morning of 2/10.    It is worth noting the patient's last dose of Eliquis was the morning of 2/9.  Holding further doses of Eliquis for now.  Will keep patient n.p.o. for now   Gentle intravenous hydration  As needed opiate-based analgesics for substantial associated pain  Obtaining vitamin D level

## 2021-08-06 NOTE — Interval H&P Note (Signed)
History and Physical Interval Note:  08/06/2021 4:26 PM  Ariel Cobb  has presented today for surgery, with the diagnosis of Right intertrochanteric femur fractre.  The various methods of treatment have been discussed with the patient and family. After consideration of risks, benefits and other options for treatment, the patient has consented to  Procedure(s): INTRAMEDULLARY (IM) NAIL INTERTROCHANTRIC (Right) as a surgical intervention.  The patient's history has been reviewed, patient examined, no change in status, stable for surgery.  I have reviewed the patient's chart and labs.  Questions were answered to the patient's satisfaction.     Lennette Bihari P Lorian Yaun

## 2021-08-06 NOTE — TOC CAGE-AID Note (Signed)
Transition of Care [facility]) - CAGE-AID Screening   Patient Details  Name: Ariel Cobb MRN: [id number] Date of Birth: August 08, 1945  [facility]) CM/SW Contact:    Gaetano Hawthorne Tarpley-Carter, [place] Phone Number: 08/06/2021, 8:56 AM   Clinical Narrative: Pt participated in [place].  Pt stated she does use substance, and does not use ETOH.  Pt smokes cigarettes.  Pt was not offered resources, due to no usage of substance.    Amory Zbikowski Tarpley-Carter, MSW, LCSW-A Pronouns:  She/Her/Hers [place] Transitions of Care Clinical Social Worker Direct Number:  [phone] [email or web address]  CAGE-AID Screening:    Have You Ever Felt You Ought to Cut Down on Your Drinking or Drug Use?: No Have People Annoyed You By [employer] Your Drinking Or Drug Use?: No Have You Felt Bad Or Guilty About Your Drinking Or Drug Use?: No Have You Ever Had a Drink or Used Drugs First Thing In The Morning to Steady Your Nerves or to Get Rid of a Hangover?: No CAGE-AID Score: 0  Substance Abuse Education Offered: Yes  Substance abuse interventions: Scientist, clinical (histocompatibility and immunogenetics)

## 2021-08-06 NOTE — Transfer of Care (Signed)
Immediate Anesthesia Transfer of Care Note  Patient: Ariel Cobb  Procedure(s) Performed: INTRAMEDULLARY (IM) NAIL INTERTROCHANTRIC (Right)  Patient Location: PACU  Anesthesia Type:General  Level of Consciousness: awake and alert   Airway & Oxygen Therapy: Patient Spontanous Breathing  Post-op Assessment: Report given to RN and Post -op Vital signs reviewed and stable  Post vital signs: Reviewed and stable  Last Vitals:  Vitals Value Taken Time  BP 155/47 08/06/21 1830  Temp    Pulse 63 08/06/21 1832  Resp 19 08/06/21 1832  SpO2 96 % 08/06/21 1832  Vitals shown include unvalidated device data.  Last Pain:  Vitals:   08/06/21 1531  TempSrc: Oral  PainSc: 2       Patients Stated Pain Goal: 2 (28/41/32 4401)  Complications: No notable events documented.

## 2021-08-06 NOTE — Assessment & Plan Note (Signed)
.   Resume patients home regimen of oral antihypertensives . Titrate antihypertensive regimen as necessary to achieve adequate BP control . PRN intravenous antihypertensives for excessively elevated blood pressure   

## 2021-08-06 NOTE — Progress Notes (Addendum)
PROGRESS NOTE    Ariel Cobb  ZOX:[id number] DOB: [birth year] DOA: 08/05/2021 PCP: Seward Carol, MD    Chief Complaint  Patient presents with   Fall    Brief Narrative:   No charge note asked patient when seen and admitted earlier today by Dr. Cyd Silence, chart, imaging and labs were reviewed, patient was seen and examined.  76 year old female with past medical history of nicotine dependence, COPD, hypothyroidism, insulin dependent diabetes mellitus type 2, kidney disease stage IIIb, atrial fibrillation on Eliquis, hypertension, hyperlipidemia who presents to [facility] emergency department with getting a fall, and right hip and thigh pain, her work-up significant for right hip fracture.  Assessment & Plan:   Principal Problem:   Closed right hip fracture, initial encounter [facility]) Active Problems:   Essential hypertension   Mixed hyperlipidemia due to type 2 diabetes mellitus (HCC)   Type 2 diabetes mellitus with stage 3b chronic kidney disease, with long-term current use of insulin (HCC)   Hypothyroidism   Chronic kidney disease, stage 3b (HCC)   Paroxysmal atrial fibrillation (HCC)   COPD (chronic obstructive pulmonary disease) (HCC)   Nicotine dependence, cigarettes, uncomplicated   Closed right hip fracture, initial encounter ([place])- (present on admission) - Patient suffered a mechanical fall resulting in a right intertrochanteric hip fracture as identified on x-ray and CT imaging -Orthopedic and pain consulted, plan for surgical repair, likely later in the day or tomorrow, timing per Christa See, keep n.p.o. for now. -Continue to hold Eliquis, resume after surgery once cleared by orthopedic. -Vitamin D level. -Continue with as needed pain meds.    Paroxysmal atrial fibrillation ([place])- (present on admission) - Currently rate controlled. - Holding Eliquis for surgery , last dose was taken AM of 2/9 - Continue home regimen of rate controlling agent     COPD  (chronic obstructive pulmonary disease) ([place])- (present on admission) -No active wheezing, continue with home regimen and as needed inhalers.     Type 2 diabetes mellitus with stage 3b chronic kidney disease, with long-term current use of insulin (HCC) -General insulin sliding scale and low-dose Semglee (on Lantus 8 units daily at home) - Diabetic Diet will be resumed once patient is no longer n.p.o.     Chronic kidney disease, stage 3b ([place])- (present on admission) Strict intake and output monitoring Creatinine near baseline Minimizing nephrotoxic agents as much as possible Serial chemistries to monitor renal function and electrolytes     Essential hypertension- (present on admission) Resume patients home regimen of oral antihypertensives Titrate antihypertensive regimen as necessary to achieve adequate BP control PRN intravenous antihypertensives for excessively elevated blood pressure       Mixed hyperlipidemia due to type 2 diabetes mellitus ([place])- (present on admission) Continuing home regimen of lipid lowering therapy.     Hypothyroidism- (present on admission) Resume home regimen of Synthroid       Nicotine dependence, cigarettes, uncomplicated- (present on admission) Patient is being counseled daily on smoking cessation. Providing patient with nicotine replacement therapy during this hospitalization.             DVT prophylaxis: resume Eliquis after SX Code Status: Full Family Communication: None at bedside Disposition:   Status is: Inpatient Remains inpatient appropriate because: will need sx for right hip frx.           Consultants:  orthopedic   Subjective:  She denies any dyspnea or chest pain, complains of right hip pain.  Objective: Vitals:   08/06/21 0715 08/06/21 0900 08/06/21  0911 08/06/21 0922  BP: (!) 166/56 (!) 144/52  (!) 147/55  Pulse: 81 74  74  Resp: 20 19    Temp:   98.4 F (36.9 C)   TempSrc:   Oral   SpO2: 100% 100%      No intake or output data in the 24 hours ending 08/06/21 1011 There were no vitals filed for this visit.  Examination:  Awake Alert, Oriented X 3, frail Symmetrical Chest wall movement, Good air movement bilaterally, CTAB RRR,No Gallops,Rubs or new Murmurs, No Parasternal Heave +ve B.Sounds, Abd Soft, No tenderness, No rebound - guarding or rigidity. No Cyanosis, Clubbing or edema, right lower extremity shortened and externally rotated, good pulses.     Data Reviewed: I have personally reviewed following labs and imaging studies  CBC: Recent Labs  Lab 08/05/21 1847 08/06/21 0210  WBC 12.0* 12.5*  NEUTROABS 9.9* 10.8*  HGB 11.3* 10.3*  HCT 33.8* 30.6*  MCV 97.1 96.5  PLT 307 941    Basic Metabolic Panel: Recent Labs  Lab 08/05/21 1847 08/06/21 0210  NA 136 135  K 3.8 4.0  CL 99 99  CO2 27 26  GLUCOSE 226* 265*  BUN 43* 43*  CREATININE 1.60* 1.55*  CALCIUM 9.1 9.1  MG  --  1.8    GFR: CrCl cannot be calculated (Unknown ideal weight.).  Liver Function Tests: Recent Labs  Lab 08/06/21 0210  AST 18  ALT 15  ALKPHOS 58  BILITOT 0.6  PROT 6.4*  ALBUMIN 3.2*    CBG: Recent Labs  Lab 08/06/21 0753  GLUCAP 228*     Recent Results (from the past 240 hour(s))  Resp Panel by RT-PCR (Flu A&B, Covid) Nasopharyngeal Swab     Status: None   Collection Time: 08/05/21 10:49 PM   Specimen: Nasopharyngeal Swab; Nasopharyngeal(NP) swabs in vial transport medium  Result Value Ref Range Status   SARS Coronavirus 2 by RT PCR NEGATIVE NEGATIVE Final    Comment: (NOTE) SARS-CoV-2 target nucleic acids are NOT DETECTED.  The SARS-CoV-2 RNA is generally detectable in upper respiratory specimens during the acute phase of infection. The lowest concentration of SARS-CoV-2 viral copies this assay can detect is 138 copies/mL. A negative result does not preclude SARS-Cov-2 infection and should not be used as the sole basis for treatment or other patient management  decisions. A negative result may occur with  improper specimen collection/handling, submission of specimen other than nasopharyngeal swab, presence of viral mutation(s) within the areas targeted by this assay, and inadequate number of viral copies(<138 copies/mL). A negative result must be combined with clinical observations, patient history, and epidemiological information. The expected result is Negative.  Fact Sheet for Patients:  EntrepreneurPulse.com.au  Fact Sheet for Healthcare Providers:  IncredibleEmployment.be  This test is no t yet approved or cleared by the [place] FDA and  has been authorized for detection and/or diagnosis of SARS-CoV-2 by FDA under an Emergency Use Authorization (EUA). This EUA will remain  in effect (meaning this test can be used) for the duration of the COVID-19 declaration under Section 564(b)(1) of the Act, 21 U.S.C.section 360bbb-3(b)(1), unless the authorization is terminated  or revoked sooner.       Influenza A by PCR NEGATIVE NEGATIVE Final   Influenza B by PCR NEGATIVE NEGATIVE Final    Comment: (NOTE) The Xpert Xpress SARS-CoV-2/FLU/RSV plus assay is intended as an aid in the diagnosis of influenza from Nasopharyngeal swab specimens and should not be used as a sole basis  for treatment. Nasal washings and aspirates are unacceptable for Xpert Xpress SARS-CoV-2/FLU/RSV testing.  Fact Sheet for Patients: EntrepreneurPulse.com.au  Fact Sheet for Healthcare Providers: IncredibleEmployment.be  This test is not yet approved or cleared by the [place] FDA and has been authorized for detection and/or diagnosis of SARS-CoV-2 by FDA under an Emergency Use Authorization (EUA). This EUA will remain in effect (meaning this test can be used) for the duration of the COVID-19 declaration under Section 564(b)(1) of the Act, 21 U.S.C. section 360bbb-3(b)(1), unless the  authorization is terminated or revoked.  Performed at [facility], [place] [address] [place], [place] [address]          Radiology Studies: DG Chest 1 View  Result Date: 08/05/2021 CLINICAL DATA:  Fall with hip fracture EXAM: CHEST  1 VIEW COMPARISON:  10/11/2015 FINDINGS: Hyperinflation. No pleural effusion or pneumothorax. Normal cardiac size. Possible partial atelectasis of left lower lobe. Aortic atherosclerosis. Old appearing bilateral rib fractures. IMPRESSION: Possible partial atelectasis at the left lower lobe, suggest two-view chest follow-up. Electronically Signed   By: Donavan Foil M.D.   On: 08/05/2021 19:41   CT Hip Right Wo Contrast  Result Date: 08/05/2021 CLINICAL DATA:  Right hip fracture, operative planning EXAM: CT OF THE RIGHT HIP WITHOUT CONTRAST TECHNIQUE: Multidetector CT imaging of the right hip was performed according to the standard protocol. Multiplanar CT image reconstructions were also generated. RADIATION DOSE REDUCTION: This exam was performed according to the departmental dose-optimization program which includes automated exposure control, adjustment of the mA and/or kV according to patient size and/or use of iterative reconstruction technique. COMPARISON:  Right hip radiographs dated 08/05/2021 FINDINGS: Impacted, mildly comminuted, intertrochanteric right hip fracture. Mild degenerative changes of the right hip. Visualized bony pelvis is intact. Mild intramuscular hemorrhage overlying the greater trochanter. Visualized soft tissues are notable for calcified uterine fibroids. IMPRESSION: Impacted, mildly comminuted, intertrochanteric right hip fracture. Electronically Signed   By: Julian Hy M.D.   On: 08/05/2021 22:36   DG Hip Unilat With Pelvis 2-3 Views Right  Result Date: 08/05/2021 CLINICAL DATA:  Right hip fracture EXAM: DG HIP (WITH OR WITHOUT PELVIS) 2-3V RIGHT COMPARISON:  None. FINDINGS: There is an acute intratrochanteric, impacted right  femoral neck fracture with mild anterior angulation of the distal fracture fragment and probable avulsion of the greater trochanter. The femoral head is still seated within the right acetabulum. There is severe superimposed right hip degenerative arthritis. Left hip bipolar hemiarthroplasty has been performed. Multiple involuted fibroids noted within the pelvis. Advanced vascular calcifications are seen. IMPRESSION: Acute intratrochanteric fracture of the right hip. Superimposed severe right hip degenerative arthritis. Electronically Signed   By: Fidela Salisbury M.D.   On: 08/05/2021 21:13   DG FEMUR, MIN 2 VIEWS RIGHT  Result Date: 08/05/2021 CLINICAL DATA:  Fall EXAM: RIGHT FEMUR 2 VIEWS COMPARISON:  None. FINDINGS: Vascular calcifications. Acute comminuted and impacted appearing proximal femoral fracture, appears to involve the basicervical region and trochanter. No femoral head dislocation. IMPRESSION: Acute comminuted and impacted proximal femoral fracture appears to involve the intertrochanteric region and basicervical region. Electronically Signed   By: Donavan Foil M.D.   On: 08/05/2021 19:40        Scheduled Meds:  atorvastatin  80 mg Oral Daily   fluticasone furoate-vilanterol  1 puff Inhalation Daily   insulin aspart  0-9 Units Subcutaneous TID WC   insulin glargine-yfgn  5 Units Subcutaneous Daily   levothyroxine  88 mcg Oral Q0600   metoprolol  succinate  25 mg Oral Daily   ramipril  20 mg Oral Daily   Continuous Infusions:  lactated ringers 75 mL/hr at 08/06/21 0755     LOS: 1 day       Phillips Climes, MD [facility]   To contact the attending provider between 7A-7P or the covering provider during after hours 7P-7A, please log into the web site [email or web address] and access using universal [place] password for that web site. If you do not have the password, please call the hospital operator.  08/06/2021, 10:11 AM

## 2021-08-06 NOTE — H&P (Signed)
History and Physical    Patient: Ariel Cobb MRN: [id number] DOA: 08/05/2021  Date of Service: the patient was seen and examined on 08/06/2021  Patient coming from: Home  Chief Complaint:  Chief Complaint  Patient presents with   Fall    HPI:   76 year old female with past medical history of nicotine dependence, COPD, hypothyroidism, insulin dependent diabetes mellitus type 2, kidney disease stage IIIb, atrial fibrillation on Eliquis, hypertension, hyperlipidemia who presents to [facility] emergency department with right hip and thigh pain.  Patient explains that at approximately 5 PM on 2/9 patient had walked down her gravel driveway to get some mail and upon walking back to her home had tripped in the gravel and fell on the ground.  Patient denies any associated light headedness or loss of consciousness.  Patient attempted to get up with assistance of her neighbor who witnessed the event and noticed that she was experiencing right hip and thigh pain.  Patient describes his pain as moderate to severe in intensity, sharp in quality, radiating distally and worse with any movement of the affected extremity.  [facility].  Upon evaluation in the emergency department x-ray of the right femur followed by CT imaging of the hip revealed an impacted mildly comminuted intertrochanteric right hip fracture.  ER provider discussed case with Dr. Mable Fill with orthopedic surgery who recommended keeping patient n.p.o. after midnight with his partner to evaluate patient in the morning for possible operative intervention.  Fentanyl was administered.  The hospitalist group was then called to assist the patient admission to the hospital.  Review of Systems: Review of Systems  Musculoskeletal:  Positive for falls and joint pain.  All other systems reviewed and are negative.   Past Medical History:  Diagnosis Date   Anemia    slight told  feb dr polite   Arthritis    Asthma    Chronic bronchitis ([place])    Chronic kidney disease    sees dr sanford yearly stage 3 stable   Claudication ([place])    Complication of anesthesia 2000   slow to awaken after hip surgery   Hyperlipidemia    Hypertension    Hypothyroidism    Osteoporosis    PAD (peripheral artery disease) ([place])    a. 07/29/13: s/p PV angiogram with successful diamondback orbital rotational atherectomy of the high-grade calcified R popliteal artery stenosis   Shortness of breath    on exertion   Thyroid disease    Type II diabetes mellitus ([place])     Past Surgical History:  Procedure Laterality Date   ABDOMINAL AORTAGRAM  06/04/2013   Procedure: ABDOMINAL [name];  Surgeon: Lorretta Harp, MD;  Location: [facility] CATH LAB;  Service: Cardiovascular;;   ATHERECTOMY Right 07/29/2013   popliteal/notes 07/29/2013   CHOLECYSTECTOMY  1980's?   COLONOSCOPY WITH PROPOFOL N/A 09/06/2016   Procedure: COLONOSCOPY WITH PROPOFOL;  Surgeon: Garlan Fair, MD;  Location: [facility] ENDOSCOPY;  Service: Endoscopy;  Laterality: N/A;   cyst from right hand     ESOPHAGOGASTRODUODENOSCOPY (EGD) WITH PROPOFOL N/A 09/06/2016   Procedure: ESOPHAGOGASTRODUODENOSCOPY (EGD) WITH PROPOFOL;  Surgeon: Garlan Fair, MD;  Location: [facility] ENDOSCOPY;  Service: Endoscopy;  Laterality: N/A;   HIP FRACTURE SURGERY Left 2000's   "I broke the ball; dr broke femur during OR; got a rod in there" 92/07/2013)   LOWER EXTREMITY ANGIOGRAM N/A 06/04/2013   Procedure: LOWER EXTREMITY ANGIOGRAM;  Surgeon: Lorretta Harp, MD;  Location:  [facility] CATH LAB;  Service: Cardiovascular;  Laterality: N/A;    Social History:  reports that she has been smoking cigarettes. She has a 50.00 pack-year smoking history. She has never used smokeless tobacco. She reports that she does not drink alcohol and does not use drugs.  Allergies  Allergen Reactions   Adhesive [Tape] Itching    Surgical Tape - "makes me itch and break me out" -- HAS TO  USE PAPER TAPE   Amoxicillin Hives, Itching and Swelling   Latex Itching   Other Other (See Comments)   Pioglitazone Other (See Comments)    Family History  Problem Relation Age of Onset   Heart disease Father     Prior to Admission medications   Medication Sig Start Date End Date Taking? Authorizing Provider  albuterol (PROVENTIL HFA;VENTOLIN HFA) 108 (90 BASE) MCG/ACT inhaler Inhale into the lungs every 6 (six) hours as needed for wheezing or shortness of breath.    [provider]  atorvastatin (LIPITOR) 80 MG tablet Take 1 tablet (80 mg total) by mouth every morning. 03/05/21   Lorretta Harp, MD  budesonide-formoterol [facility]) 160-4.5 MCG/ACT inhaler Inhale 2 puffs into the lungs 2 (two) times daily.    [provider]  Calcium Carb-Cholecalciferol 600-800 MG-UNIT TABS Take 1 tablet by mouth daily.     [provider]  clobetasol (TEMOVATE) 0.05 % external solution APPLY TO AFFECTED AREA UP TO TWICE A DAY AS NEEDED (NOT TO FACE, GROIN, OR AXILLA) 07/31/19   [provider]  ELIQUIS 5 MG TABS tablet TAKE 1 TABLET BY MOUTH TWICE A DAY 07/21/21   Lorretta Harp, MD  ferrous sulfate 325 (65 FE) MG tablet Take 325 mg by mouth every other day.    [provider]  hydrochlorothiazide (HYDRODIURIL) 25 MG tablet Take 25 mg by mouth daily. 12/15/14   [provider]  insulin glargine (LANTUS) 100 UNIT/ML Solostar Pen Inject 8 Units into the skin daily at 2 PM.     [provider]  levothyroxine (SYNTHROID) 75 MCG tablet  11/05/18   [provider]  metoprolol succinate (TOPROL-XL) 25 MG 24 hr tablet TAKE 1 TABLET BY MOUTH EVERY DAY 10/16/20   Lorretta Harp, MD  NOVOLOG FLEXPEN 100 UNIT/ML FlexPen Inject 4 Units into the skin 3 (three) times daily. Inject 4 units in the morning, 3 units at lunch. (Sliding scale) 12/23/14   [provider]  ONE TOUCH ULTRA TEST test strip TEST THREE TIMES DAILY E11.22 10/06/17    [provider]  ramipril (ALTACE) 10 MG capsule Take 20 mg by mouth 2 (two) times daily.  01/30/13   [provider]  sodium chloride (OCEAN) 0.65 % SOLN nasal spray Place 1 spray into both nostrils as needed for congestion.    [provider]    Physical Exam:  Vitals:   08/05/21 2105 08/05/21 2150 08/05/21 2200 08/05/21 2230  BP: (!) 169/92 (!) 158/51 (!) 163/65 (!) 154/57  Pulse: 63 61 64 65  Resp: 18 18 18 13   Temp:      TempSrc:      SpO2: 100% 100% 100% 100%    Constitutional: Awake alert and oriented x3, patient is in distress due to right hip and thigh pain.   Skin: Numerous ecchymoses noted over the bilateral upper extremities.  Notable skin tear noted over the right elbow.  Dressing around the right elbow is currently clean dry and intact.  Poor skin turgor noted.  Eyes: Pupils are equally reactive to light.  No evidence of scleral icterus or conjunctival pallor.  ENMT: Dry mucous membranes noted.  Posterior pharynx clear of any exudate or lesions.   Neck: normal, supple, no masses, no thyromegaly.  No evidence of jugular venous distension.   Respiratory: clear to auscultation bilaterally, no wheezing, no crackles. Normal respiratory effort. No accessory muscle use.  Cardiovascular: Regular rate and rhythm, no murmurs / rubs / gallops. No extremity edema. 2+ pedal pulses. No carotid bruits.  Chest:   Nontender without crepitus or deformity.   Back:   Nontender without crepitus or deformity. Abdomen: Abdomen is soft and nontender.  No evidence of intra-abdominal masses.  Positive bowel sounds noted in all quadrants.   Musculoskeletal: Severe pain with both passive and active range of motion of the right hip.  Notable outward rotation of the right foot.  no contractures. Normal muscle tone.  Neurologic: CN 2-12 grossly intact. Sensation intact.  Patient moving all 4 extremities spontaneously.  Patient is following all commands.  Patient is responsive to  verbal stimuli.   Psychiatric: Patient exhibits anxious mood with appropriate affect.  Patient seems to possess insight as to their current situation.    Data Reviewed:  I have personally reviewed and interpreted labs, imaging.  Significant findings are creatinine 1.6.  Notable leukocytosis with a white blood cell count of 12.0.  CT imaging of the right hip revealing impacted mildly commuted intertrochanteric right hip fracture.  EKG: Personally reviewed.  Rhythm is normal sinus rhythm with heart rate of 76 bpm.  No dynamic ST segment changes appreciated.  Assessment and Plan: * Closed right hip fracture, initial encounter [facility])- (present on admission) Patient suffered a mechanical fall resulting in a right intertrochanteric hip fracture as identified on x-ray and CT imaging ER provider discussed case with Dr. Mable Fill with [facility] who will evaluate patient in consultation Possible operative intervention on the morning of 2/10.   It is worth noting the patient's last dose of Eliquis was the morning of 2/9.  Holding further doses of Eliquis for now. Will keep patient n.p.o. for now  Gentle intravenous hydration As needed opiate-based analgesics for substantial associated pain Obtaining vitamin D level   Paroxysmal atrial fibrillation ([place])- (present on admission) Currently rate controlled. Holding Eliquis, last dose was taken AM of 2/9 Continue home regimen of rate controlling agent Monitoring on telemetry   COPD (chronic obstructive pulmonary disease) ([place])- (present on admission) No evidence of COPD exacerbation this time Continue home regimen of maintenance inhalers As needed bronchodilator therapy for episodic shortness of breath and wheezing.   Type 2 diabetes mellitus with stage 3b chronic kidney disease, with long-term current use of insulin ([place]) Patient been placed on Accu-Cheks before every meal and nightly with sliding scale insulin Continue home regimen  of low-dose basal insulin therapy Hemoglobin A1C ordered Diabetic Diet will be resumed once patient is no longer n.p.o.   Chronic kidney disease, stage 3b ([place])- (present on admission) Strict intake and output monitoring Creatinine near baseline Minimizing nephrotoxic agents as much as possible Serial chemistries to monitor renal function and electrolytes   Essential hypertension- (present on admission) Resume patients home regimen of oral antihypertensives Titrate antihypertensive regimen as necessary to achieve adequate BP control PRN intravenous antihypertensives for excessively elevated blood pressure    Mixed hyperlipidemia due to type 2 diabetes mellitus ([place])- (present on admission) Continuing home regimen of lipid lowering therapy.   Hypothyroidism- (present on admission) Resume home regimen of  Synthroid    Nicotine dependence, cigarettes, uncomplicated- (present on admission) Patient is being counseled daily on smoking cessation. Providing patient with nicotine replacement therapy during this hospitalization.         Code Status:  Full code  code status decision has been confirmed with: Patient Family Communication: deferred   Consults: Dr. Mable Fill with orthopedic surgery.  Severity of Illness:  The appropriate patient status for this patient is INPATIENT. Inpatient status is judged to be reasonable and necessary in order to provide the required intensity of service to ensure the patient's safety. The patient's presenting symptoms, physical exam findings, and initial radiographic and laboratory data in the context of their chronic comorbidities is felt to place them at high risk for further clinical deterioration. Furthermore, it is not anticipated that the patient will be medically stable for discharge from the hospital within 2 midnights of admission.   * I certify that at the point of admission it is my clinical judgment that the patient will require  inpatient hospital care spanning beyond 2 midnights from the point of admission due to high intensity of service, high risk for further deterioration and high frequency of surveillance required.*  Author:  Vernelle Emerald MD  08/06/2021 1:07 AM

## 2021-08-06 NOTE — Assessment & Plan Note (Signed)
•   Patient been placed on Accu-Cheks before every meal and nightly with sliding scale insulin  Continue home regimen of low-dose basal insulin therapy  Hemoglobin A1C ordered  Diabetic Diet will be resumed once patient is no longer n.p.o.

## 2021-08-07 DIAGNOSIS — T148XXA Other injury of unspecified body region, initial encounter: Secondary | ICD-10-CM | POA: Diagnosis not present

## 2021-08-07 DIAGNOSIS — I480 Paroxysmal atrial fibrillation: Secondary | ICD-10-CM | POA: Diagnosis not present

## 2021-08-07 DIAGNOSIS — N1832 Chronic kidney disease, stage 3b: Secondary | ICD-10-CM | POA: Diagnosis not present

## 2021-08-07 LAB — CBC
HCT: 26 % — ABNORMAL LOW (ref 36.0–46.0)
Hemoglobin: 8.9 g/dL — ABNORMAL LOW (ref 12.0–15.0)
MCH: 33 pg (ref 26.0–34.0)
MCHC: 34.2 g/dL (ref 30.0–36.0)
MCV: 96.3 fL (ref 80.0–100.0)
Platelets: 232 10*3/uL (ref 150–400)
RBC: 2.7 MIL/uL — ABNORMAL LOW (ref 3.87–5.11)
RDW: 13.6 % (ref 11.5–15.5)
WBC: 11.2 10*3/uL — ABNORMAL HIGH (ref 4.0–10.5)
nRBC: 0 % (ref 0.0–0.2)

## 2021-08-07 LAB — BASIC METABOLIC PANEL
Anion gap: 10 (ref 5–15)
BUN: 41 mg/dL — ABNORMAL HIGH (ref 8–23)
CO2: 26 mmol/L (ref 22–32)
Calcium: 8.6 mg/dL — ABNORMAL LOW (ref 8.9–10.3)
Chloride: 99 mmol/L (ref 98–111)
Creatinine, Ser: 1.77 mg/dL — ABNORMAL HIGH (ref 0.44–1.00)
GFR, Estimated: 29 mL/min — ABNORMAL LOW (ref >60)
Glucose, Bld: 218 mg/dL — ABNORMAL HIGH (ref 70–99)
Potassium: 4.4 mmol/L (ref 3.5–5.1)
Sodium: 135 mmol/L (ref 135–145)

## 2021-08-07 LAB — GLUCOSE, CAPILLARY
Glucose-Capillary: 390 mg/dL — ABNORMAL HIGH (ref 70–99)
Glucose-Capillary: 228 mg/dL — ABNORMAL HIGH (ref 70–99)
Glucose-Capillary: 112 mg/dL — ABNORMAL HIGH (ref 70–99)
Glucose-Capillary: 196 mg/dL — ABNORMAL HIGH (ref 70–99)

## 2021-08-07 LAB — VITAMIN D 25 HYDROXY (VIT D DEFICIENCY, FRACTURES): Vit D, 25-Hydroxy: 44.17 ng/mL (ref 30–100)

## 2021-08-07 MED ORDER — INSULIN GLARGINE-YFGN 100 UNIT/ML ~~LOC~~ SOLN
8.0000 [IU] | Freq: Every day | SUBCUTANEOUS | Status: DC
  Administered 2021-08-07 – 2021-08-13 (×7): 8 [IU] via SUBCUTANEOUS
  Filled 2021-08-07 (×7): qty 0.08

## 2021-08-07 MED ORDER — APIXABAN 2.5 MG PO TABS
2.5000 mg | ORAL_TABLET | Freq: Two times a day (BID) | ORAL | Status: DC
  Administered 2021-08-07 (×2): 2.5 mg via ORAL
  Filled 2021-08-07 (×2): qty 1

## 2021-08-07 MED ORDER — AMLODIPINE BESYLATE 5 MG PO TABS
5.0000 mg | ORAL_TABLET | Freq: Every day | ORAL | Status: DC
  Administered 2021-08-07 – 2021-08-10 (×4): 5 mg via ORAL
  Filled 2021-08-07 (×4): qty 1

## 2021-08-07 NOTE — Evaluation (Signed)
Physical Therapy Evaluation Patient Details Name: Ariel Cobb MRN: [id number] DOB: Mar 26, 1946 Today's Date: 08/07/2021  History of Present Illness  Pt is a 76 y/o F presenting to ED on 2/9 after mechanical fall in driveway, found to have R hip fx. s/p IMN on 2/10. PMH includes arthritis, HLD, HTN, PAD   Clinical Impression  This patient presents with acute pain and decreased functional independence following the above mentioned procedure. At the time of PT eval, pt was able to perform transfers with up to +2 min assist for balance support and safety with RW for support. Pt lives alone and does not have consistent support for assistance. Feel she would progress the best with SNF level rehab to prepare for return home. Acutely, this patient is appropriate for skilled PT interventions to address functional limitations, improve safety and independence with functional mobility, and return to PLOF.        Recommendations for follow up therapy are one component of a multi-disciplinary discharge planning process, led by the attending physician.  Recommendations may be updated based on patient status, additional functional criteria and insurance authorization.  Follow Up Recommendations Skilled nursing-short term rehab (<3 hours/day)    Assistance Recommended at Discharge Frequent or constant Supervision/Assistance  Patient can return home with the following  Two people to help with walking and/or transfers;Assist for transportation;Help with stairs or ramp for entrance    Equipment Recommendations Rolling walker (2 wheels)  Recommendations for Other Services       Functional Status Assessment Patient has had a recent decline in their functional status and demonstrates the ability to make significant improvements in function in a reasonable and predictable amount of time.     Precautions / Restrictions Precautions Precautions: Fall Restrictions Weight Bearing Restrictions: Yes RLE Weight  Bearing: Weight bearing as tolerated      Mobility  Bed Mobility Overal bed mobility: Needs Assistance Bed Mobility: Supine to Sit     Supine to sit: Min assist, +2 for physical assistance     General bed mobility comments: +2 assist for advancement of the RLE, scooting hips around, and elevating trunk to full sitting position. Increased time required for all aspects.    Transfers Overall transfer level: Needs assistance Equipment used: Rolling walker (2 wheels) Transfers: Sit to/from Stand, Bed to chair/wheelchair/BSC Sit to Stand: Min assist, +2 physical assistance   Step pivot transfers: Min assist, +2 physical assistance       General transfer comment: VC's for sequencing and general safety throughout transfers. Increased time required.    Ambulation/Gait               General Gait Details: Unable to progress to gait training at this time.  Stairs            Wheelchair Mobility    Modified Rankin (Stroke Patients Only)       Balance Overall balance assessment: Needs assistance Sitting-balance support: Feet supported Sitting balance-Leahy Scale: Fair Sitting balance - Comments: sits unsupported EOB without LOB   Standing balance support: Reliant on assistive device for balance, During functional activity Standing balance-Leahy Scale: Poor                               Pertinent Vitals/Pain Pain Assessment Pain Assessment: Faces Faces Pain Scale: Hurts little more Pain Location: RLE Pain Descriptors / Indicators: Discomfort Pain Intervention(s): Limited activity within patient's tolerance, Monitored during session, Repositioned  Home Living Family/patient expects to be discharged to:: Private residence Living Arrangements: Alone Available Help at Discharge: Family;Friend(s);Available PRN/intermittently Type of Home: House Home Access: Stairs to enter Entrance Stairs-Rails: Can reach both Entrance Stairs-Number of Steps:  3   Home Layout: One level Home Equipment: Cane - single point;Other (comment);BSC/3in1 (possibly has shower seat in her garage?)      Prior Function Prior Level of Function : Independent/Modified Independent             Mobility Comments: uses cane ADLs Comments: does IADLs     Hand Dominance        Extremity/Trunk Assessment   Upper Extremity Assessment Upper Extremity Assessment: Defer to OT evaluation    Lower Extremity Assessment Lower Extremity Assessment: RLE deficits/detail RLE Deficits / Details: Decreased strength and AROM consistent with pre-op injury and subsequent surgery. RLE: Unable to fully assess due to pain    Cervical / Trunk Assessment Cervical / Trunk Assessment: Normal;Other exceptions Cervical / Trunk Exceptions: Large lipoma-appearing bulge at the base of her neck.  Communication   Communication: No difficulties  Cognition Arousal/Alertness: Awake/alert Behavior During Therapy: Anxious Overall Cognitive Status: Within Functional Limits for tasks assessed                                 General Comments: some anxiety regarding movement, seems to have decreased awareness to deficits/current level of function        General Comments General comments (skin integrity, edema, etc.): pt reporting dizziness, BP WFL throughout session, HR in 130's with transfers but VSS otherwise. Pt reports mild R hand edema, able to perform composite digit flex/ext, reports pain at knuckles but states she also has arthritis.    Exercises General Exercises - Lower Extremity Quad Sets: 10 reps Long Arc Quad: 10 reps   Assessment/Plan    PT Assessment Patient needs continued PT services  PT Problem List Decreased strength;Decreased range of motion;Decreased activity tolerance;Decreased balance;Decreased mobility;Decreased knowledge of use of DME;Decreased safety awareness;Decreased knowledge of precautions;Pain       PT Treatment Interventions  DME instruction;Gait training;Functional mobility training;Therapeutic activities;Therapeutic exercise;Neuromuscular re-education    PT Goals (Current goals can be found in the Care Plan section)  Acute Rehab PT Goals Patient Stated Goal: Return home alone with intermittent support PT Goal Formulation: With patient Time For Goal Achievement: 08/21/21 Potential to Achieve Goals: Good    Frequency Min 3X/week     Co-evaluation   Reason for Co-Treatment: Complexity of the patient's impairments (multi-system involvement);For patient/therapist safety;To address functional/ADL transfers   OT goals addressed during session: ADL's and self-care       AM-PAC PT "6 Clicks" Mobility  Outcome Measure Help needed turning from your back to your side while in a flat bed without using bedrails?: A Lot Help needed moving from lying on your back to sitting on the side of a flat bed without using bedrails?: Total Help needed moving to and from a bed to a chair (including a wheelchair)?: Total Help needed standing up from a chair using your arms (e.g., wheelchair or bedside chair)?: Total Help needed to walk in hospital room?: Total Help needed climbing 3-5 steps with a railing? : Total 6 Click Score: 7    End of Session Equipment Utilized During Treatment: Gait belt       PT Visit Diagnosis: Unsteadiness on feet (R26.81);Pain    Time: 8099-8338 PT Time Calculation (min) (ACUTE  ONLY): 40 min   Charges:   PT Evaluation $PT Eval Moderate Complexity: 1 Mod          Rolinda Roan, PT, DPT Acute Rehabilitation Services Pager: [phone] Office: [phone]   Thelma Comp 08/07/2021, 1:25 PM

## 2021-08-07 NOTE — Progress Notes (Signed)
PROGRESS NOTE    Ariel Cobb  WNU:[id number] DOB: 07/25/1945 DOA: 08/05/2021 PCP: Seward Carol, MD    Chief Complaint  Patient presents with   Fall    Brief Narrative:   76 year old female with past medical history of nicotine dependence, COPD, hypothyroidism, insulin dependent diabetes mellitus type 2, kidney disease stage IIIb, atrial fibrillation on Eliquis, hypertension, hyperlipidemia who presents to [facility] emergency department with getting a fall, and right hip and thigh pain, her work-up significant for right hip fracture.  She went for surgical repair by Dr. Doreatha Martin on 2/10.  Assessment & Plan:   Principal Problem:   Closed right hip fracture, initial encounter [facility]) Active Problems:   Essential hypertension   Mixed hyperlipidemia due to type 2 diabetes mellitus (HCC)   Type 2 diabetes mellitus with stage 3b chronic kidney disease, with long-term current use of insulin (HCC)   Hypothyroidism   Chronic kidney disease, stage 3b (HCC)   Paroxysmal atrial fibrillation (HCC)   COPD (chronic obstructive pulmonary disease) (HCC)   Nicotine dependence, cigarettes, uncomplicated   Closed right hip fracture, initial encounter ([place])- (present on admission) - Patient suffered a mechanical fall resulting in a right intertrochanteric hip fracture as identified on x-ray and CT imaging -Status post surgical repair by Dr. Doreatha Martin on 2/10 -PT/OT to follow -We will start on Eliquis prophylaxis dose today, and will increase to full treatment dose in 1 to 2 days as long her hemoglobin remained stable. -Vitamin D 25 hydroxy within normal limit    Paroxysmal atrial fibrillation ([place])- (present on admission) - Currently rate controlled. - Continue home regimen of rate controlling agent -Resumed on Eliquis prophylaxis dose today, but will increase to treatment dose if hemoglobin remained stable.  Acute blood loss anemia -Postoperative with hemoglobin of 8.9, expected,  continue to monitor closely and transfuse as needed.     COPD (chronic obstructive pulmonary disease) ([place])- (present on admission) -No active wheezing, continue with home regimen and as needed inhalers.     Type 2 diabetes mellitus with stage 3b chronic kidney disease, with long-term current use of insulin (HCC) -General insulin sliding scale and low-dose Semglee (on Lantus 8 units daily at home) - Diabetic Diet will be resumed once patient is no longer n.p.o.     Chronic kidney disease, stage 3b ([place])- (present on admission) -Creatinine near baseline, continue to monitor closely and avoid nephrotoxic medications. -Hold ramipril for now     Essential hypertension- (present on admission) - Resume patients home regimen of oral antihypertensives -We will hold ramipril for now and start on low-dose Norvasc   Mixed hyperlipidemia due to type 2 diabetes mellitus ([place])- (present on admission) - Continuing home regimen of lipid lowering therapy.     Hypothyroidism- (present on admission) - Continue  home regimen of Synthroid       Nicotine dependence, cigarettes, uncomplicated- (present on admission) - Patient is being counseled daily on smoking cessation. - Providing patient with nicotine replacement therapy during this hospitalization.             DVT prophylaxis:  Eliquis 2.5 mg po BID Code Status: Full Family Communication: None at bedside Disposition:   Status is: Inpatient Remains inpatient appropriate because: will need sx for right hip frx.           Consultants:  orthopedic   Subjective:  She denies any dyspnea or chest pain, complains of right hip pain.  Objective: Vitals:   08/07/21 0039 08/07/21 0405 08/07/21 0500  08/07/21 0735  BP: (!) 142/57 (!) 149/85 (!) 171/63 (!) 162/77  Pulse: 74 80  87  Resp: 17 18  16   Temp:    98.4 F (36.9 C)  TempSrc:    Oral  SpO2: 94% 92%  92%  Weight:      Height:        Intake/Output Summary (Last 24  hours) at 08/07/2021 1103 Last data filed at 08/07/2021 0048 Gross per 24 hour  Intake 2080.6 ml  Output 75 ml  Net 2005.6 ml   Filed Weights   08/06/21 1532  Weight: 58.5 kg    Examination:  Awake Alert, Oriented X 3,frail  Symmetrical Chest wall movement, Good air movement bilaterally, CTAB RRR,No Gallops,Rubs or new Murmurs, No Parasternal Heave +ve B.Sounds, Abd Soft, No tenderness, No rebound - guarding or rigidity. No Cyanosis, Clubbing or edema, No new Rash or bruise        Data Reviewed: I have personally reviewed following labs and imaging studies  CBC: Recent Labs  Lab 08/05/21 1847 08/06/21 0210 08/07/21 0108  WBC 12.0* 12.5* 11.2*  NEUTROABS 9.9* 10.8*  --   HGB 11.3* 10.3* 8.9*  HCT 33.8* 30.6* 26.0*  MCV 97.1 96.5 96.3  PLT 307 274 811    Basic Metabolic Panel: Recent Labs  Lab 08/05/21 1847 08/06/21 0210 08/07/21 0108  NA 136 135 135  K 3.8 4.0 4.4  CL 99 99 99  CO2 27 26 26   GLUCOSE 226* 265* 218*  BUN 43* 43* 41*  CREATININE 1.60* 1.55* 1.77*  CALCIUM 9.1 9.1 8.6*  MG  --  1.8  --     GFR: Estimated Creatinine Clearance: 24 mL/min (A) (by C-G formula based on SCr of 1.77 mg/dL (H)).  Liver Function Tests: Recent Labs  Lab 08/06/21 0210  AST 18  ALT 15  ALKPHOS 58  BILITOT 0.6  PROT 6.4*  ALBUMIN 3.2*    CBG: Recent Labs  Lab 08/06/21 0753 08/06/21 1136 08/06/21 1529 08/06/21 1835 08/07/21 0811  GLUCAP 228* 189* 102* 151* 390*     Recent Results (from the past 240 hour(s))  Resp Panel by RT-PCR (Flu A&B, Covid) Nasopharyngeal Swab     Status: None   Collection Time: 08/05/21 10:49 PM   Specimen: Nasopharyngeal Swab; Nasopharyngeal(NP) swabs in vial transport medium  Result Value Ref Range Status   SARS Coronavirus 2 by RT PCR NEGATIVE NEGATIVE Final    Comment: (NOTE) SARS-CoV-2 target nucleic acids are NOT DETECTED.  The SARS-CoV-2 RNA is generally detectable in upper respiratory specimens during the acute  phase of infection. The lowest concentration of SARS-CoV-2 viral copies this assay can detect is 138 copies/mL. A negative result does not preclude SARS-Cov-2 infection and should not be used as the sole basis for treatment or other patient management decisions. A negative result may occur with  improper specimen collection/handling, submission of specimen other than nasopharyngeal swab, presence of viral mutation(s) within the areas targeted by this assay, and inadequate number of viral copies(<138 copies/mL). A negative result must be combined with clinical observations, patient history, and epidemiological information. The expected result is Negative.  Fact Sheet for Patients:  EntrepreneurPulse.com.au  Fact Sheet for Healthcare Providers:  IncredibleEmployment.be  This test is no t yet approved or cleared by the [place] FDA and  has been authorized for detection and/or diagnosis of SARS-CoV-2 by FDA under an Emergency Use Authorization (EUA). This EUA will remain  in effect (meaning this test can be used) for  the duration of the COVID-19 declaration under Section 564(b)(1) of the Act, 21 U.S.C.section 360bbb-3(b)(1), unless the authorization is terminated  or revoked sooner.       Influenza A by PCR NEGATIVE NEGATIVE Final   Influenza B by PCR NEGATIVE NEGATIVE Final    Comment: (NOTE) The Xpert Xpress SARS-CoV-2/FLU/RSV plus assay is intended as an aid in the diagnosis of influenza from Nasopharyngeal swab specimens and should not be used as a sole basis for treatment. Nasal washings and aspirates are unacceptable for Xpert Xpress SARS-CoV-2/FLU/RSV testing.  Fact Sheet for Patients: EntrepreneurPulse.com.au  Fact Sheet for Healthcare Providers: IncredibleEmployment.be  This test is not yet approved or cleared by the [place] FDA and has been authorized for detection and/or diagnosis of  SARS-CoV-2 by FDA under an Emergency Use Authorization (EUA). This EUA will remain in effect (meaning this test can be used) for the duration of the COVID-19 declaration under Section 564(b)(1) of the Act, 21 U.S.C. section 360bbb-3(b)(1), unless the authorization is terminated or revoked.  Performed at [facility], [place] [address], [place], [place] [address]   Surgical PCR Screen     Status: None   Collection Time: 08/06/21  3:51 PM   Specimen: Nasal Mucosa; Nasal Swab  Result Value Ref Range Status   MRSA, PCR NEGATIVE NEGATIVE Final   Staphylococcus aureus NEGATIVE NEGATIVE Final    Comment: (NOTE) The Xpert SA Assay (FDA approved for NASAL specimens in patients 40 years of age and older), is one component of a comprehensive surveillance program. It is not intended to diagnose infection nor to guide or monitor treatment. Performed at [facility], [place] [address] [place], [place] [address]          Radiology Studies: DG Chest 1 View  Result Date: 08/05/2021 CLINICAL DATA:  Fall with hip fracture EXAM: CHEST  1 VIEW COMPARISON:  10/11/2015 FINDINGS: Hyperinflation. No pleural effusion or pneumothorax. Normal cardiac size. Possible partial atelectasis of left lower lobe. Aortic atherosclerosis. Old appearing bilateral rib fractures. IMPRESSION: Possible partial atelectasis at the left lower lobe, suggest two-view chest follow-up. Electronically Signed   By: Donavan Foil M.D.   On: 08/05/2021 19:41   CT Hip Right Wo Contrast  Result Date: 08/05/2021 CLINICAL DATA:  Right hip fracture, operative planning EXAM: CT OF THE RIGHT HIP WITHOUT CONTRAST TECHNIQUE: Multidetector CT imaging of the right hip was performed according to the standard protocol. Multiplanar CT image reconstructions were also generated. RADIATION DOSE REDUCTION: This exam was performed according to the departmental dose-optimization program which includes automated exposure control, adjustment of the  mA and/or kV according to patient size and/or use of iterative reconstruction technique. COMPARISON:  Right hip radiographs dated 08/05/2021 FINDINGS: Impacted, mildly comminuted, intertrochanteric right hip fracture. Mild degenerative changes of the right hip. Visualized bony pelvis is intact. Mild intramuscular hemorrhage overlying the greater trochanter. Visualized soft tissues are notable for calcified uterine fibroids. IMPRESSION: Impacted, mildly comminuted, intertrochanteric right hip fracture. Electronically Signed   By: Julian Hy M.D.   On: 08/05/2021 22:36   DG C-Arm 1-60 Min-No Report  Result Date: 08/06/2021 Fluoroscopy was utilized by the requesting physician.  No radiographic interpretation.   DG HIP PORT UNILAT W OR W/O PELVIS 1V RIGHT  Result Date: 08/06/2021 CLINICAL DATA:  Fall.  Post of radiograph. EXAM: DG HIP (WITH OR WITHOUT PELVIS) 1V PORT RIGHT COMPARISON:  Right hip CT dated 08/05/2021. FINDINGS: Status post ORIF of right femoral neck fracture with intramedullary rod  and dynamic screw. The hardware is intact. There is a left hip arthroplasty. The bones are osteopenic. No acute fracture or dislocation. Arthritic changes of the right hip. Postsurgical changes of the soft tissues of the right pelvis. Calcification within the pelvis, likely related to fibroid. IMPRESSION: 1. No acute fracture or dislocation. 2. Status post ORIF of right femoral neck fracture. Electronically Signed   By: Anner Crete M.D.   On: 08/06/2021 20:37   DG Hip Unilat With Pelvis 2-3 Views Right  Result Date: 08/05/2021 CLINICAL DATA:  Right hip fracture EXAM: DG HIP (WITH OR WITHOUT PELVIS) 2-3V RIGHT COMPARISON:  None. FINDINGS: There is an acute intratrochanteric, impacted right femoral neck fracture with mild anterior angulation of the distal fracture fragment and probable avulsion of the greater trochanter. The femoral head is still seated within the right acetabulum. There is severe  superimposed right hip degenerative arthritis. Left hip bipolar hemiarthroplasty has been performed. Multiple involuted fibroids noted within the pelvis. Advanced vascular calcifications are seen. IMPRESSION: Acute intratrochanteric fracture of the right hip. Superimposed severe right hip degenerative arthritis. Electronically Signed   By: Fidela Salisbury M.D.   On: 08/05/2021 21:13   DG FEMUR, MIN 2 VIEWS RIGHT  Result Date: 08/06/2021 CLINICAL DATA:  Right femur ORIF EXAM: RIGHT FEMUR 2 VIEWS; DG C-ARM 1-60 MIN-NO REPORT COMPARISON:  08/05/2021 FINDINGS: 5 C-arm fluoroscopic images were obtained intraoperatively and submitted for post operative interpretation. Images demonstrate placement of ORIF hardware traversing intertrochanteric right hip fracture. Final image demonstrates near anatomic fracture alignment. 1 minute and 7 seconds of fluoroscopy time was utilized. Please see the performing provider's procedural report for further detail. IMPRESSION: As above. Electronically Signed   By: Davina Poke D.O.   On: 08/06/2021 18:52   DG FEMUR, MIN 2 VIEWS RIGHT  Result Date: 08/05/2021 CLINICAL DATA:  Fall EXAM: RIGHT FEMUR 2 VIEWS COMPARISON:  None. FINDINGS: Vascular calcifications. Acute comminuted and impacted appearing proximal femoral fracture, appears to involve the basicervical region and trochanter. No femoral head dislocation. IMPRESSION: Acute comminuted and impacted proximal femoral fracture appears to involve the intertrochanteric region and basicervical region. Electronically Signed   By: Donavan Foil M.D.   On: 08/05/2021 19:40        Scheduled Meds:  apixaban  2.5 mg Oral BID   atorvastatin  80 mg Oral Daily   docusate sodium  100 mg Oral BID   fluticasone furoate-vilanterol  1 puff Inhalation Daily   insulin aspart  0-9 Units Subcutaneous TID WC   insulin glargine-yfgn  8 Units Subcutaneous Daily   levothyroxine  88 mcg Oral Q0600   metoprolol succinate  25 mg Oral Daily    ramipril  20 mg Oral Daily   Continuous Infusions:     LOS: 2 days       Phillips Climes, MD [facility]   To contact the attending provider between 7A-7P or the covering provider during after hours 7P-7A, please log into the web site [email or web address] and access using universal [place] password for that web site. If you do not have the password, please call the hospital operator.  08/07/2021, 11:03 AM

## 2021-08-07 NOTE — Evaluation (Signed)
Occupational Therapy Evaluation Patient Details Name: Ariel Cobb MRN: [id number] DOB: 06-09-1946 Today's Date: 08/07/2021   History of Present Illness Pt is a 76 y/o F presenting to ED on 2/9 after mechanical fall in driveway, found to have R hip fx. s/p IMN on 2/10. PMH includes arthritis, HLD, HTN, PAD   Clinical Impression   Pt reports independence at baseline with ADLs, uses cane for mobility. Lives alone, but has family/friends that can assist PRN if needed.  Currently pt min A +2 for bed mobility and transfers, pt requires increased cuing for hand placement and increased time for stepping during stand/step-pivot transfer. Pt min-max A for ADLs, LOB noted with standing ADL needing assistance to recorrect. Pt presenting with impairments listed below, will follow acutely. Recommend d/c to SNF.     Recommendations for follow up therapy are one component of a multi-disciplinary discharge planning process, led by the attending physician.  Recommendations may be updated based on patient status, additional functional criteria and insurance authorization.   Follow Up Recommendations  Skilled nursing-short term rehab (<3 hours/day)    Assistance Recommended at Discharge Intermittent Supervision/Assistance  Patient can return home with the following A little help with walking and/or transfers;A little help with bathing/dressing/bathroom;Assistance with cooking/housework    Functional Status Assessment  Patient has had a recent decline in their functional status and demonstrates the ability to make significant improvements in function in a reasonable and predictable amount of time.  Equipment Recommendations  Other (comment) (RW)    Recommendations for Other Services PT consult     Precautions / Restrictions Precautions Precautions: Fall Restrictions Weight Bearing Restrictions: Yes RLE Weight Bearing: Weight bearing as tolerated      Mobility Bed Mobility Overal bed mobility:  Needs Assistance Bed Mobility: Supine to Sit, Sit to Supine     Supine to sit: Min assist, +2 for physical assistance Sit to supine: Min assist, +2 for physical assistance   General bed mobility comments: assist to scoot forward with pad    Transfers Overall transfer level: Needs assistance Equipment used: Rolling walker (2 wheels) Transfers: Sit to/from Stand, Bed to chair/wheelchair/BSC Sit to Stand: Min assist, +2 physical assistance Stand pivot transfers: Min assist, +2 physical assistance   Step pivot transfers: Min assist, +2 physical assistance     General transfer comment: increased cuing for sequencing, increased time needed to step feet      Balance Overall balance assessment: Needs assistance Sitting-balance support: Feet supported Sitting balance-Leahy Scale: Fair Sitting balance - Comments: sits unsupported EOB without LOB   Standing balance support: Reliant on assistive device for balance, During functional activity Standing balance-Leahy Scale: Poor                             ADL either performed or assessed with clinical judgement   ADL Overall ADL's : Needs assistance/impaired Eating/Feeding: Set up;Sitting   Grooming: Set up;Sitting   Upper Body Bathing: Minimal assistance;Sitting   Lower Body Bathing: Maximal assistance;Sitting/lateral leans   Upper Body Dressing : Minimal assistance;Sitting   Lower Body Dressing: Maximal assistance;Bed level Lower Body Dressing Details (indicate cue type and reason): donning socks Toilet Transfer: Moderate assistance;Minimal assistance;+2 for physical assistance;Rolling walker (2 wheels);BSC/3in1;Stand-pivot;Cueing for sequencing;Cueing for safety Toilet Transfer Details (indicate cue type and reason): completed x2 Toileting- Clothing Manipulation and Hygiene: Sit to/from stand;Moderate assistance Toileting - Clothing Manipulation Details (indicate cue type and reason): able to complete pericare,  however with  LOB in standing     Functional mobility during ADLs: Minimal assistance;+2 for physical assistance       Vision   Vision Assessment?: No apparent visual deficits     Perception     Praxis      Pertinent Vitals/Pain Pain Assessment Pain Assessment: Faces Pain Score: 3  Faces Pain Scale: Hurts a little bit Pain Location: RLE Pain Descriptors / Indicators: Discomfort Pain Intervention(s): Limited activity within patient's tolerance, Monitored during session, Premedicated before session, Repositioned     Hand Dominance     Extremity/Trunk Assessment Upper Extremity Assessment Upper Extremity Assessment: Generalized weakness   Lower Extremity Assessment Lower Extremity Assessment: Defer to PT evaluation   Cervical / Trunk Assessment Cervical / Trunk Assessment: Normal   Communication Communication Communication: No difficulties   Cognition Arousal/Alertness: Awake/alert Behavior During Therapy: Anxious Overall Cognitive Status: Within Functional Limits for tasks assessed                                 General Comments: some anxiety regarding movement, seems to have decreased awareness to deficits/current level of function     General Comments  pt reporting dizziness, BP WFL throughout session, HR in 130's with transfers but VSS otherwise. Pt reports mild R hand edema, able to perform composite digit flex/ext, reports pain at knuckles but states she also has arthritis.    Exercises     Shoulder Instructions      Home Living Family/patient expects to be discharged to:: Private residence Living Arrangements: Alone Available Help at Discharge: Family;Friend(s);Available PRN/intermittently Type of Home: House Home Access: Stairs to enter [employer] of Steps: 3 Entrance Stairs-Rails: Can reach both Home Layout: One level     Bathroom Shower/Tub: Tub/shower unit;Curtain         Home Equipment: Cane - single point;Other  (comment);BSC/3in1 (possibly has shower seat in her garage?)          Prior Functioning/Environment Prior Level of Function : Independent/Modified Independent             Mobility Comments: uses cane ADLs Comments: does IADLs        OT Problem List: Decreased strength;Decreased range of motion;Decreased activity tolerance;Impaired balance (sitting and/or standing);Decreased knowledge of use of DME or AE;Decreased safety awareness      OT Treatment/Interventions: Self-care/ADL training;Therapeutic exercise;DME and/or AE instruction;Energy conservation;Therapeutic activities;Patient/family education;Balance training    OT Goals(Current goals can be found in the care plan section) Acute Rehab OT Goals Patient Stated Goal: to get better OT Goal Formulation: With patient Time For Goal Achievement: 08/21/21 Potential to Achieve Goals: Good ADL Goals Pt Will Perform Upper Body Dressing: with supervision;sitting Pt Will Perform Lower Body Dressing: with mod assist;sit to/from stand;sitting/lateral leans Pt Will Transfer to Toilet: with min guard assist;bedside commode;stand pivot transfer Pt Will Perform Tub/Shower Transfer: with min guard assist;rolling walker;3 in 1;Shower transfer;Tub transfer  OT Frequency: Min 2X/week    Co-evaluation PT/OT/SLP Co-Evaluation/Treatment: Yes Reason for Co-Treatment: Complexity of the patient's impairments (multi-system involvement);For patient/therapist safety;To address functional/ADL transfers   OT goals addressed during session: ADL's and self-care      AM-PAC OT "6 Clicks" Daily Activity     Outcome Measure Help from another person eating meals?: None Help from another person taking care of personal grooming?: A Little Help from another person toileting, which includes using toliet, bedpan, or urinal?: A Lot Help from another person bathing (including washing, rinsing,  drying)?: A Lot Help from another person to put on and taking off  regular upper body clothing?: A Little Help from another person to put on and taking off regular lower body clothing?: A Lot 6 Click Score: 16   End of Session Equipment Utilized During Treatment: Gait belt;Rolling walker (2 wheels) Nurse Communication: Mobility status  Activity Tolerance: Patient tolerated treatment well Patient left: in chair;with call bell/phone within reach;with chair alarm set  OT Visit Diagnosis: Unsteadiness on feet (R26.81);Other abnormalities of gait and mobility (R26.89);Muscle weakness (generalized) (M62.81)                Time: 5859-2924 OT Time Calculation (min): 40 min Charges:  OT General Charges $OT Visit: 1 Visit OT Evaluation $OT Eval Low Complexity: 1 Low OT Treatments $Self Care/Home Management : 8-22 mins  Lynnda Child, OTD, OTR/L Acute Rehab [phone]) [phone] - [place] 08/07/2021, 12:24 PM

## 2021-08-07 NOTE — Progress Notes (Signed)
ANTICOAGULATION CONSULT NOTE- follow-up  Pharmacy Consult for apixaban  Indication: atrial fibrillation  Allergies  Allergen Reactions   Adhesive [Tape] Itching    Surgical Tape - "makes me itch and break me out" -- HAS TO USE PAPER TAPE   Amoxicillin Hives, Itching and Swelling   Latex Itching   Other Other (See Comments)   Pioglitazone Other (See Comments)    Patient Measurements: Height: 5\' 5"  (165.1 cm) Weight: 58.5 kg (128 lb 15.5 oz) IBW/kg (Calculated) : 57  Vital Signs: Temp: 98.4 F (36.9 C) (02/11 0735) Temp Source: Oral (02/11 0735) BP: 162/77 (02/11 0735) Pulse Rate: 87 (02/11 0735)  Labs: Recent Labs    08/05/21 1847 08/06/21 0210 08/07/21 0108  HGB 11.3* 10.3* 8.9*  HCT 33.8* 30.6* 26.0*  PLT 307 274 232  LABPROT 15.4*  --   --   INR 1.2  --   --   CREATININE 1.60* 1.55* 1.77*     Estimated Creatinine Clearance: 24 mL/min (A) (by C-G formula based on SCr of 1.77 mg/dL (H)).   Medical History: Past Medical History:  Diagnosis Date   Anemia    slight told feb dr polite   Arthritis    Asthma    Chronic bronchitis ([place])    Chronic kidney disease    sees dr sanford yearly stage 3 stable   Claudication ([place])    Complication of anesthesia 2000   slow to awaken after hip surgery   Hyperlipidemia    Hypertension    Hypothyroidism    Osteoporosis    PAD (peripheral artery disease) ([place])    a. 07/29/13: s/p PV angiogram with successful diamondback orbital rotational atherectomy of the high-grade calcified R popliteal artery stenosis   Shortness of breath    on exertion   Thyroid disease    Type II diabetes mellitus ([place])     Medications:  Medications Prior to Admission  Medication Sig Dispense Refill Last Dose   albuterol (PROVENTIL HFA;VENTOLIN HFA) 108 (90 BASE) MCG/ACT inhaler Inhale into the lungs every 6 (six) hours as needed for wheezing or shortness of breath.   unk   atorvastatin (LIPITOR) 80 MG tablet Take 1 tablet (80 mg total) by  mouth every morning. 100 tablet 3 08/05/2021   budesonide-formoterol (SYMBICORT) 160-4.5 MCG/ACT inhaler Inhale 2 puffs into the lungs 2 (two) times daily.   08/05/2021   Calcium Carb-Cholecalciferol 600-800 MG-UNIT TABS Take 1 tablet by mouth daily.    08/05/2021   clobetasol (TEMOVATE) 0.05 % external solution 1 application 2 (two) times daily as needed (rash).   unk   ELIQUIS 5 MG TABS tablet TAKE 1 TABLET BY MOUTH TWICE A DAY (Patient taking differently: Take 5 mg by mouth 2 (two) times daily.) 180 tablet 1 08/05/2021 at 0700   ferrous sulfate 325 (65 FE) MG tablet Take 325 mg by mouth 3 (three) times a week. Jory Sims, Friday   08/04/2021   hydrochlorothiazide (HYDRODIURIL) 25 MG tablet Take 25 mg by mouth daily.  2 08/05/2021   insulin glargine (LANTUS) 100 UNIT/ML Solostar Pen Inject 8 Units into the skin daily at 2 PM.    08/05/2021   levothyroxine (SYNTHROID) 88 MCG tablet Take 88 mcg by mouth daily before breakfast.   08/05/2021   metoprolol succinate (TOPROL-XL) 25 MG 24 hr tablet TAKE 1 TABLET BY MOUTH EVERY DAY (Patient taking differently: 25 mg daily.) 90 tablet 3 08/05/2021 at 0700   NOVOLOG FLEXPEN 100 UNIT/ML FlexPen Inject 4-6 Units into the skin  3 (three) times daily. Inject 4 units in the morning, 5 units at lunch.   6 units in the evening  (Sliding scale)  11 08/05/2021   ramipril (ALTACE) 10 MG capsule Take 20 mg by mouth daily.   08/05/2021   sodium chloride (OCEAN) 0.65 % SOLN nasal spray Place 1 spray into both nostrils as needed for congestion.   unk   ONE TOUCH ULTRA TEST test strip TEST THREE TIMES DAILY E11.22  4     Assessment: 3 yof who presented with a fall and found to have a R hip fracture. On apixaban PTA for hx Afib (LD 2/9@0700 ).   Underwent nailing of R intertrochanteric femur fx 2/10. Hgb 10.3, plt 274. No s/sx of bleeding. Okay per ortho to restart if Hgb stable (>= 9) and no transfusion requirements on 2/11. Given Scr 1.55 and wt< 60 kg, will get BMP in AM to monitor Scr trend  prior to ordering dose   Goal of Therapy:  Monitor platelets by anticoagulation protocol: Yes   Plan:  Hemoglobin 8.9 mg/dL F/u restart apixaban restart Monitor transfusion requirements and if >=9 Monitor Scr to see if need dose reduction from home dose of 5 mg BID   Shantea Poulton BS, PharmD, BCPS Clinical Pharmacist 08/07/2021 7:48 AM  Please check AMION for all [place] phone numbers After 10:00 PM, call [place]

## 2021-08-07 NOTE — Plan of Care (Signed)
°  Problem: Education: Goal: Knowledge of General Education information will improve Description: Including pain rating scale, medication(s)/side effects and non-pharmacologic comfort measures Outcome: Progressing   Problem: Health Behavior/Discharge Planning: Goal: Ability to manage health-related needs will improve Outcome: Progressing   Problem: Activity: Goal: Risk for activity intolerance will decrease Outcome: Progressing   Problem: Nutrition: Goal: Adequate nutrition will be maintained Outcome: Progressing   Problem: Elimination: Goal: Will not experience complications related to bowel motility Outcome: Progressing   Problem: Safety: Goal: Ability to remain free from injury will improve Outcome: Progressing

## 2021-08-07 NOTE — Progress Notes (Signed)
° ° °  Subjective: Patient reports pain as marked. Currently waiting for nurse to transfer her back into bed from chair. Very uncomfortable in chair.  Tolerating diet.  Urinating.  No CP, SOB.  Working with PT on [place] but having some issues with balance.   Objective:   VITALS:   Vitals:   08/07/21 0039 08/07/21 0405 08/07/21 0500 08/07/21 0735  BP: (!) 142/57 (!) 149/85 (!) 171/63 (!) 162/77  Pulse: 74 80  87  Resp: 17 18  16   Temp:    98.4 F (36.9 C)  TempSrc:    Oral  SpO2: 94% 92%  92%  Weight:      Height:       CBC Latest Ref Rng & Units 08/07/2021 08/06/2021 08/05/2021  WBC 4.0 - 10.5 K/uL 11.2(H) 12.5(H) 12.0(H)  Hemoglobin 12.0 - 15.0 g/dL 8.9(L) 10.3(L) 11.3(L)  Hematocrit 36.0 - 46.0 % 26.0(L) 30.6(L) 33.8(L)  Platelets 150 - 400 K/uL 232 274 307   BMP Latest Ref Rng & Units 08/07/2021 08/06/2021 08/05/2021  Glucose 70 - 99 mg/dL 218(H) 265(H) 226(H)  BUN 8 - 23 mg/dL 41(H) 43(H) 43(H)  Creatinine 0.44 - 1.00 mg/dL 1.77(H) 1.55(H) 1.60(H)  BUN/Creat Ratio 12 - 28 - - -  Sodium 135 - 145 mmol/L 135 135 136  Potassium 3.5 - 5.1 mmol/L 4.4 4.0 3.8  Chloride 98 - 111 mmol/L 99 99 99  CO2 22 - 32 mmol/L 26 26 27   Calcium 8.9 - 10.3 mg/dL 8.6(L) 9.1 9.1   Intake/Output      02/10 0701 02/11 0700 02/11 0701 02/12 0700   P.O. 120    I.V. (mL/kg) 1811.9 (31)    IV Piggyback 148.7    Total Intake(mL/kg) 2080.6 (35.6)    Blood 75    Total Output 75    Net +2005.6         Urine Occurrence 3 x       Physical Exam: General: NAD.  Sitting up in bedside chair in obvious discomfort. Somewhat short and agitated with me.  Resp: No increased wob Cardio: regular rate and rhythm ABD soft Neurologically intact MSK Neurovascularly intact Sensation intact distally Intact pulses distally Dorsiflexion/Plantar flexion intact Incision: dressing C/D/I   Assessment: 1 Day Post-Op  S/P Procedure(s) (LRB): INTRAMEDULLARY (IM) NAIL INTERTROCHANTRIC (Right) by Dr. Doreatha Martin  on 08/06/21  Principal Problem:   Closed right hip fracture, initial encounter [facility]) Active Problems:   Essential hypertension   Mixed hyperlipidemia due to type 2 diabetes mellitus ([place])   Type 2 diabetes mellitus with stage 3b chronic kidney disease, with long-term current use of insulin ([facility])   Hypothyroidism   Chronic kidney disease, stage 3b ([facility])   Paroxysmal atrial fibrillation ([facility])   COPD (chronic obstructive pulmonary disease) ([facility])   Nicotine dependence, cigarettes, uncomplicated   Plan: Pain control  Advance diet Up with therapy Incentive Spirometry Elevate and Apply ice  Weightbearing: WBAT RLE Insicional and dressing care: OK to remove dressings Sunday and leave open to air with dry gauze PRN Orthopedic device(s): None Showering: Keep dressing dry VTE prophylaxis:  Eliquis  , SCDs, ambulation Pain control: continue current regimen Follow - up plan: 2 weeks post-op with Dr. Doreatha Martin   Dispo: Skilled Nursing Facility/Rehab recommended by PT/OT.      Britt Bottom, PA-C Office [phone] 08/07/2021, 12:32 PM

## 2021-08-07 NOTE — Progress Notes (Deleted)
Physical Therapy Treatment Patient Details Name: Ariel Cobb MRN: [id number] DOB: 1946/05/05 Today's Date: 08/07/2021   History of Present Illness Pt is a 76 y/o F presenting to ED on 2/9 after mechanical fall in driveway, found to have R hip fx. s/p IMN on 2/10. PMH includes arthritis, HLD, HTN, PAD    PT Comments    This patient presents with acute pain and decreased functional independence following the above mentioned procedure. At the time of PT eval, pt was able to perform transfers with up to +2 min assist for balance support and safety with RW for support. Pt lives alone and does not have consistent support for assistance. Feel she would progress the best with SNF level rehab to prepare for return home. Acutely, this patient is appropriate for skilled PT interventions to address functional limitations, improve safety and independence with functional mobility, and return to PLOF.     Recommendations for follow up therapy are one component of a multi-disciplinary discharge planning process, led by the attending physician.  Recommendations may be updated based on patient status, additional functional criteria and insurance authorization.  Follow Up Recommendations  Skilled nursing-short term rehab (<3 hours/day)     Assistance Recommended at Discharge Frequent or constant Supervision/Assistance  Patient can return home with the following Two people to help with walking and/or transfers;Assist for transportation;Help with stairs or ramp for entrance   Equipment Recommendations  Rolling walker (2 wheels)    Recommendations for Other Services       Precautions / Restrictions Precautions Precautions: Fall Restrictions Weight Bearing Restrictions: Yes RLE Weight Bearing: Weight bearing as tolerated     Mobility  Bed Mobility Overal bed mobility: Needs Assistance Bed Mobility: Supine to Sit     Supine to sit: Min assist, +2 for physical assistance     General bed  mobility comments: +2 assist for advancement of the RLE, scooting hips around, and elevating trunk to full sitting position. Increased time required for all aspects.    Transfers Overall transfer level: Needs assistance Equipment used: Rolling walker (2 wheels) Transfers: Sit to/from Stand, Bed to chair/wheelchair/BSC Sit to Stand: Min assist, +2 physical assistance   Step pivot transfers: Min assist, +2 physical assistance       General transfer comment: VC's for sequencing and general safety throughout transfers. Increased time required.    Ambulation/Gait               General Gait Details: Unable to progress to gait training at this time.   Stairs             Wheelchair Mobility    Modified Rankin (Stroke Patients Only)       Balance Overall balance assessment: Needs assistance Sitting-balance support: Feet supported Sitting balance-Leahy Scale: Fair Sitting balance - Comments: sits unsupported EOB without LOB   Standing balance support: Reliant on assistive device for balance, During functional activity Standing balance-Leahy Scale: Poor                              Cognition Arousal/Alertness: Awake/alert Behavior During Therapy: Anxious Overall Cognitive Status: Within Functional Limits for tasks assessed                                 General Comments: some anxiety regarding movement, seems to have decreased awareness to deficits/current level of function  Exercises General Exercises - Lower Extremity Quad Sets: 10 reps Long Arc Quad: 10 reps    General Comments General comments (skin integrity, edema, etc.): pt reporting dizziness, BP WFL throughout session, HR in 130's with transfers but VSS otherwise. Pt reports mild R hand edema, able to perform composite digit flex/ext, reports pain at knuckles but states she also has arthritis.      Pertinent Vitals/Pain Pain Assessment Pain Assessment:  Faces Faces Pain Scale: Hurts little more Pain Location: RLE Pain Descriptors / Indicators: Discomfort Pain Intervention(s): Limited activity within patient's tolerance, Monitored during session, Repositioned    Home Living Family/patient expects to be discharged to:: Private residence Living Arrangements: Alone Available Help at Discharge: Family;Friend(s);Available PRN/intermittently Type of Home: House Home Access: Stairs to enter Entrance Stairs-Rails: Can reach both Entrance Stairs-Number of Steps: 3   Home Layout: One level Home Equipment: Cane - single point;Other (comment);BSC/3in1 (possibly has shower seat in her garage?)      Prior Function            PT Goals (current goals can now be found in the care plan section) Acute Rehab PT Goals Patient Stated Goal: Return home alone with intermittent support PT Goal Formulation: With patient Time For Goal Achievement: 08/21/21 Potential to Achieve Goals: Good    Frequency    Min 3X/week      PT Plan      Co-evaluation   Reason for Co-Treatment: Complexity of the patient's impairments (multi-system involvement);For patient/therapist safety;To address functional/ADL transfers   OT goals addressed during session: ADL's and self-care      AM-PAC PT "6 Clicks" Mobility   Outcome Measure  Help needed turning from your back to your side while in a flat bed without using bedrails?: A Lot Help needed moving from lying on your back to sitting on the side of a flat bed without using bedrails?: Total Help needed moving to and from a bed to a chair (including a wheelchair)?: Total Help needed standing up from a chair using your arms (e.g., wheelchair or bedside chair)?: Total Help needed to walk in hospital room?: Total Help needed climbing 3-5 steps with a railing? : Total 6 Click Score: 7    End of Session Equipment Utilized During Treatment: Gait belt       PT Visit Diagnosis: Unsteadiness on feet  (R26.81);Pain     Time: 2924-4628 PT Time Calculation (min) (ACUTE ONLY): 40 min  Charges:                        Ariel Cobb, PT, DPT Acute Rehabilitation Services Pager: [phone] Office: [phone]    Ariel Cobb 08/07/2021, 1:21 PM

## 2021-08-08 DIAGNOSIS — E1122 Type 2 diabetes mellitus with diabetic chronic kidney disease: Secondary | ICD-10-CM | POA: Diagnosis not present

## 2021-08-08 DIAGNOSIS — S72001A Fracture of unspecified part of neck of right femur, initial encounter for closed fracture: Secondary | ICD-10-CM | POA: Diagnosis not present

## 2021-08-08 DIAGNOSIS — N1832 Chronic kidney disease, stage 3b: Secondary | ICD-10-CM | POA: Diagnosis not present

## 2021-08-08 DIAGNOSIS — I480 Paroxysmal atrial fibrillation: Secondary | ICD-10-CM | POA: Diagnosis not present

## 2021-08-08 LAB — CBC
HCT: 21.2 % — ABNORMAL LOW (ref 36.0–46.0)
Hemoglobin: 7 g/dL — ABNORMAL LOW (ref 12.0–15.0)
MCH: 32 pg (ref 26.0–34.0)
MCHC: 33 g/dL (ref 30.0–36.0)
MCV: 96.8 fL (ref 80.0–100.0)
Platelets: 204 10*3/uL (ref 150–400)
RBC: 2.19 MIL/uL — ABNORMAL LOW (ref 3.87–5.11)
RDW: 13.7 % (ref 11.5–15.5)
WBC: 10.8 10*3/uL — ABNORMAL HIGH (ref 4.0–10.5)
nRBC: 0 % (ref 0.0–0.2)

## 2021-08-08 LAB — BASIC METABOLIC PANEL
Anion gap: 8 (ref 5–15)
BUN: 56 mg/dL — ABNORMAL HIGH (ref 8–23)
CO2: 27 mmol/L (ref 22–32)
Calcium: 8.1 mg/dL — ABNORMAL LOW (ref 8.9–10.3)
Chloride: 97 mmol/L — ABNORMAL LOW (ref 98–111)
Creatinine, Ser: 1.81 mg/dL — ABNORMAL HIGH (ref 0.44–1.00)
GFR, Estimated: 28 mL/min — ABNORMAL LOW (ref >60)
Glucose, Bld: 183 mg/dL — ABNORMAL HIGH (ref 70–99)
Potassium: 3.8 mmol/L (ref 3.5–5.1)
Sodium: 132 mmol/L — ABNORMAL LOW (ref 135–145)

## 2021-08-08 LAB — GLUCOSE, CAPILLARY
Glucose-Capillary: 183 mg/dL — ABNORMAL HIGH (ref 70–99)
Glucose-Capillary: 282 mg/dL — ABNORMAL HIGH (ref 70–99)
Glucose-Capillary: 160 mg/dL — ABNORMAL HIGH (ref 70–99)
Glucose-Capillary: 131 mg/dL — ABNORMAL HIGH (ref 70–99)
Glucose-Capillary: 183 mg/dL — ABNORMAL HIGH (ref 70–99)

## 2021-08-08 LAB — PREPARE RBC (CROSSMATCH)

## 2021-08-08 MED ORDER — SODIUM CHLORIDE 0.9% IV SOLUTION: Freq: Once | INTRAVENOUS | Status: AC

## 2021-08-08 MED ORDER — DOCUSATE SODIUM 100 MG PO CAPS
200.0000 mg | ORAL_CAPSULE | Freq: Two times a day (BID) | ORAL | Status: DC
  Administered 2021-08-08 – 2021-08-12 (×8): 200 mg via ORAL
  Filled 2021-08-08 (×9): qty 2

## 2021-08-08 MED ORDER — POLYETHYLENE GLYCOL 3350 17 G PO PACK
34.0000 g | PACK | Freq: Two times a day (BID) | ORAL | Status: AC
  Administered 2021-08-08 (×2): 34 g via ORAL
  Filled 2021-08-08 (×2): qty 2

## 2021-08-08 MED ORDER — BISACODYL 5 MG PO TBEC
10.0000 mg | DELAYED_RELEASE_TABLET | Freq: Once | ORAL | Status: AC
  Administered 2021-08-08: 10 mg via ORAL
  Filled 2021-08-08: qty 2

## 2021-08-08 NOTE — Progress Notes (Signed)
PROGRESS NOTE    Ariel Cobb  KGU:[id number] DOB: Nov 16, 1945 DOA: 08/05/2021 PCP: Seward Carol, MD    Chief Complaint  Patient presents with   Fall    Brief Narrative:   76 year old female with past medical history of nicotine dependence, COPD, hypothyroidism, insulin dependent diabetes mellitus type 2, kidney disease stage IIIb, atrial fibrillation on Eliquis, hypertension, hyperlipidemia who presents to [facility] emergency department with getting a fall, and right hip and thigh pain, her work-up significant for right hip fracture.  She went for surgical repair by Dr. Doreatha Martin on 2/10.  Patient with significant anemia hemoglobin of 7 as of 2/12, transfusing 1 unit PRBC.  Assessment & Plan:   Principal Problem:   Closed right hip fracture, initial encounter [facility]) Active Problems:   Essential hypertension   Mixed hyperlipidemia due to type 2 diabetes mellitus (HCC)   Type 2 diabetes mellitus with stage 3b chronic kidney disease, with long-term current use of insulin (HCC)   Hypothyroidism   Chronic kidney disease, stage 3b (HCC)   Paroxysmal atrial fibrillation (HCC)   COPD (chronic obstructive pulmonary disease) (HCC)   Nicotine dependence, cigarettes, uncomplicated   Closed right hip fracture, initial encounter ([place])- (present on admission) - Patient suffered a mechanical fall resulting in a right intertrochanteric hip fracture as identified on x-ray and CT imaging -Status post surgical repair by Dr. Doreatha Martin on 2/10 -PT/OT consulted, recommendation for SNF placement -Vitamin D 25 hydroxy within normal limit    Paroxysmal atrial fibrillation ([place])- (present on admission) - Currently rate controlled. - Continue home regimen of rate controlling agent -Eliquis remains on hold when acute blood loss anemia.   Acute blood loss anemia -With significant postoperative drop, she was on Eliquis before surgery, hemoglobin is 7 today, will give 1 unit PRBC today and  monitor CBC closely . -She is on Eliquis DVT prophylaxis dose 2.5 mg p.o. twice daily, I will hold today given her anemia .   COPD (chronic obstructive pulmonary disease) ([place])- (present on admission) -No active wheezing, continue with home regimen and as needed inhalers.     Type 2 diabetes mellitus with stage 3b chronic kidney disease, with long-term current use of insulin (HCC) -General insulin sliding scale and low-dose Semglee (on Lantus 8 units daily at home) - Diabetic Diet will be resumed once patient is no longer n.p.o.     Chronic kidney disease, stage 3b ([place])- (present on admission) -Creatinine near baseline, continue to monitor closely and avoid nephrotoxic medications. -Hold ramipril for now   Essential hypertension- (present on admission) - Resume patients home regimen of oral antihypertensives -We will hold ramipril for now and start on low-dose Norvasc   Mixed hyperlipidemia due to type 2 diabetes mellitus ([place])- (present on admission) - Continuing home regimen of lipid lowering therapy.     Hypothyroidism- (present on admission) - Continue  home regimen of Synthroid     Nicotine dependence, cigarettes, uncomplicated- (present on admission) - Patient is being counseled daily on smoking cessation. - Providing patient with nicotine replacement therapy during this hospitalization.             DVT prophylaxis:  Eliquis 2.5 mg po BID on hold due to acute blood loss anemia. Code Status: Full Family Communication: None at bedside Disposition: She will need SNF placement  Status is: Inpatient Remains inpatient appropriate because: Acute blood loss anemia           Consultants:  orthopedic   Subjective:  Reports right hip  pain is better controlled, she reports constipation.  Objective: Vitals:   08/07/21 2010 08/08/21 0448 08/08/21 0734 08/08/21 0800  BP: (!) 123/51 (!) 139/49 (!) 135/54   Pulse: 78 72 87   Resp: 18 20 19    Temp: 97.6 F  (36.4 C) 98 F (36.7 C) 98.4 F (36.9 C)   TempSrc: Oral Oral Oral   SpO2: 100% 94% 97% 90%  Weight:      Height:        Intake/Output Summary (Last 24 hours) at 08/08/2021 1119 Last data filed at 08/08/2021 1015 Gross per 24 hour  Intake 120 ml  Output --  Net 120 ml   Filed Weights   08/06/21 1532  Weight: 58.5 kg    Examination:  Awake Alert, Oriented X 3, frail  Symmetrical Chest wall movement, Good air movement bilaterally, CTAB RRR,No Gallops,Rubs or new Murmurs, No Parasternal Heave +ve B.Sounds, Abd Soft, No tenderness, No rebound - guarding or rigidity. No Cyanosis, Clubbing or edema, No new Rash or bruise , bandage at right hip surgical incisions with no oozing, or blood or significant bruising.       Data Reviewed: I have personally reviewed following labs and imaging studies  CBC: Recent Labs  Lab 08/05/21 1847 08/06/21 0210 08/07/21 0108 08/08/21 0334  WBC 12.0* 12.5* 11.2* 10.8*  NEUTROABS 9.9* 10.8*  --   --   HGB 11.3* 10.3* 8.9* 7.0*  HCT 33.8* 30.6* 26.0* 21.2*  MCV 97.1 96.5 96.3 96.8  PLT 307 274 232 092    Basic Metabolic Panel: Recent Labs  Lab 08/05/21 1847 08/06/21 0210 08/07/21 0108 08/08/21 0334  NA 136 135 135 132*  K 3.8 4.0 4.4 3.8  CL 99 99 99 97*  CO2 27 26 26 27   GLUCOSE 226* 265* 218* 183*  BUN 43* 43* 41* 56*  CREATININE 1.60* 1.55* 1.77* 1.81*  CALCIUM 9.1 9.1 8.6* 8.1*  MG  --  1.8  --   --     GFR: Estimated Creatinine Clearance: 23.4 mL/min (A) (by C-G formula based on SCr of 1.81 mg/dL (H)).  Liver Function Tests: Recent Labs  Lab 08/06/21 0210  AST 18  ALT 15  ALKPHOS 58  BILITOT 0.6  PROT 6.4*  ALBUMIN 3.2*    CBG: Recent Labs  Lab 08/07/21 1122 08/07/21 1642 08/07/21 2013 08/08/21 0751 08/08/21 1051  GLUCAP 228* 112* 196* 183* 282*     Recent Results (from the past 240 hour(s))  Resp Panel by RT-PCR (Flu A&B, Covid) Nasopharyngeal Swab     Status: None   Collection Time: 08/05/21  10:49 PM   Specimen: Nasopharyngeal Swab; Nasopharyngeal(NP) swabs in vial transport medium  Result Value Ref Range Status   SARS Coronavirus 2 by RT PCR NEGATIVE NEGATIVE Final    Comment: (NOTE) SARS-CoV-2 target nucleic acids are NOT DETECTED.  The SARS-CoV-2 RNA is generally detectable in upper respiratory specimens during the acute phase of infection. The lowest concentration of SARS-CoV-2 viral copies this assay can detect is 138 copies/mL. A negative result does not preclude SARS-Cov-2 infection and should not be used as the sole basis for treatment or other patient management decisions. A negative result may occur with  improper specimen collection/handling, submission of specimen other than nasopharyngeal swab, presence of viral mutation(s) within the areas targeted by this assay, and inadequate number of viral copies(<138 copies/mL). A negative result must be combined with clinical observations, patient history, and epidemiological information. The expected result is Negative.  Fact Sheet  for Patients:  EntrepreneurPulse.com.au  Fact Sheet for Healthcare Providers:  IncredibleEmployment.be  This test is no t yet approved or cleared by the [place] FDA and  has been authorized for detection and/or diagnosis of SARS-CoV-2 by FDA under an Emergency Use Authorization (EUA). This EUA will remain  in effect (meaning this test can be used) for the duration of the COVID-19 declaration under Section 564(b)(1) of the Act, 21 U.S.C.section 360bbb-3(b)(1), unless the authorization is terminated  or revoked sooner.       Influenza A by PCR NEGATIVE NEGATIVE Final   Influenza B by PCR NEGATIVE NEGATIVE Final    Comment: (NOTE) The Xpert Xpress SARS-CoV-2/FLU/RSV plus assay is intended as an aid in the diagnosis of influenza from Nasopharyngeal swab specimens and should not be used as a sole basis for treatment. Nasal washings and aspirates  are unacceptable for Xpert Xpress SARS-CoV-2/FLU/RSV testing.  Fact Sheet for Patients: EntrepreneurPulse.com.au  Fact Sheet for Healthcare Providers: IncredibleEmployment.be  This test is not yet approved or cleared by the [place] FDA and has been authorized for detection and/or diagnosis of SARS-CoV-2 by FDA under an Emergency Use Authorization (EUA). This EUA will remain in effect (meaning this test can be used) for the duration of the COVID-19 declaration under Section 564(b)(1) of the Act, 21 U.S.C. section 360bbb-3(b)(1), unless the authorization is terminated or revoked.  Performed at [facility], [place] [address] [place], [place] [address]   Surgical PCR Screen     Status: None   Collection Time: 08/06/21  3:51 PM   Specimen: Nasal Mucosa; Nasal Swab  Result Value Ref Range Status   MRSA, PCR NEGATIVE NEGATIVE Final   Staphylococcus aureus NEGATIVE NEGATIVE Final    Comment: (NOTE) The Xpert SA Assay (FDA approved for NASAL specimens in patients 40 years of age and older), is one component of a comprehensive surveillance program. It is not intended to diagnose infection nor to guide or monitor treatment. Performed at [facility], [place] [address] [place],  [address]          Radiology Studies: DG C-Arm 1-60 Min-No Report  Result Date: 08/06/2021 Fluoroscopy was utilized by the requesting physician.  No radiographic interpretation.   DG HIP PORT UNILAT W OR W/O PELVIS 1V RIGHT  Result Date: 08/06/2021 CLINICAL DATA:  Fall.  Post of radiograph. EXAM: DG HIP (WITH OR WITHOUT PELVIS) 1V PORT RIGHT COMPARISON:  Right hip CT dated 08/05/2021. FINDINGS: Status post ORIF of right femoral neck fracture with intramedullary rod and dynamic screw. The hardware is intact. There is a left hip arthroplasty. The bones are osteopenic. No acute fracture or dislocation. Arthritic changes of the right hip. Postsurgical  changes of the soft tissues of the right pelvis. Calcification within the pelvis, likely related to fibroid. IMPRESSION: 1. No acute fracture or dislocation. 2. Status post ORIF of right femoral neck fracture. Electronically Signed   By: Anner Crete M.D.   On: 08/06/2021 20:37   DG FEMUR, MIN 2 VIEWS RIGHT  Result Date: 08/06/2021 CLINICAL DATA:  Right femur ORIF EXAM: RIGHT FEMUR 2 VIEWS; DG C-ARM 1-60 MIN-NO REPORT COMPARISON:  08/05/2021 FINDINGS: 5 C-arm fluoroscopic images were obtained intraoperatively and submitted for post operative interpretation. Images demonstrate placement of ORIF hardware traversing intertrochanteric right hip fracture. Final image demonstrates near anatomic fracture alignment. 1 minute and 7 seconds of fluoroscopy time was utilized. Please see the performing provider's procedural report for further detail. IMPRESSION: As above. Electronically Signed  By: Davina Poke D.O.   On: 08/06/2021 18:52        Scheduled Meds:  amLODipine  5 mg Oral Daily   atorvastatin  80 mg Oral Daily   docusate sodium  100 mg Oral BID   fluticasone furoate-vilanterol  1 puff Inhalation Daily   insulin aspart  0-9 Units Subcutaneous TID WC   insulin glargine-yfgn  8 Units Subcutaneous Daily   levothyroxine  88 mcg Oral Q0600   metoprolol succinate  25 mg Oral Daily   Continuous Infusions:     LOS: 3 days       Phillips Climes, MD [facility]   To contact the attending provider between 7A-7P or the covering provider during after hours 7P-7A, please log into the web site [email or web address] and access using universal [place] password for that web site. If you do not have the password, please call the hospital operator.  08/08/2021, 11:19 AM

## 2021-08-08 NOTE — Progress Notes (Signed)
Subjective: Patient reports pain as moderate. Worse with movement. Tolerating diet as best she can but says it is not the normal stuff she eats.  Urinating.  No CP, SOB but does have cough.  Working with PT on [place] but having some issues with balance. Frustrated with her loss of independence from this injury.   Objective:   VITALS:   Vitals:   08/07/21 2010 08/08/21 0448 08/08/21 0734 08/08/21 0800  BP: (!) 123/51 (!) 139/49 (!) 135/54   Pulse: 78 72 87   Resp: 18 20 19    Temp: 97.6 F (36.4 C) 98 F (36.7 C) 98.4 F (36.9 C)   TempSrc: Oral Oral Oral   SpO2: 100% 94% 97% 90%  Weight:      Height:       CBC Latest Ref Rng & Units 08/08/2021 08/07/2021 08/06/2021  WBC 4.0 - 10.5 K/uL 10.8(H) 11.2(H) 12.5(H)  Hemoglobin 12.0 - 15.0 g/dL 7.0(L) 8.9(L) 10.3(L)  Hematocrit 36.0 - 46.0 % 21.2(L) 26.0(L) 30.6(L)  Platelets 150 - 400 K/uL 204 232 274   BMP Latest Ref Rng & Units 08/08/2021 08/07/2021 08/06/2021  Glucose 70 - 99 mg/dL 183(H) 218(H) 265(H)  BUN 8 - 23 mg/dL 56(H) 41(H) 43(H)  Creatinine 0.44 - 1.00 mg/dL 1.81(H) 1.77(H) 1.55(H)  BUN/Creat Ratio 12 - 28 - - -  Sodium 135 - 145 mmol/L 132(L) 135 135  Potassium 3.5 - 5.1 mmol/L 3.8 4.4 4.0  Chloride 98 - 111 mmol/L 97(L) 99 99  CO2 22 - 32 mmol/L 27 26 26   Calcium 8.9 - 10.3 mg/dL 8.1(L) 8.6(L) 9.1   Intake/Output      02/11 0701 02/12 0700 02/12 0701 02/13 0700   P.O.  120   I.V. (mL/kg)     IV Piggyback     Total Intake(mL/kg)  120 (2.1)   Blood     Total Output     Net  +120        Urine Occurrence 1 x 2 x      Physical Exam: General: NAD. Sitting up in bed, calm, more talkative and less agitated today. Currently receiving blood Resp: No increased wob Cardio: regular rate and rhythm ABD soft Neurologically intact MSK Neurovascularly intact Sensation intact distally Intact pulses distally Dorsiflexion/Plantar flexion intact Extensive ecchymosis B/L UE Incision: dressing  C/D/I   Assessment: 2 Days Post-Op  S/P Procedure(s) (LRB): INTRAMEDULLARY (IM) NAIL INTERTROCHANTRIC (Right) by Dr. Doreatha Martin on 08/06/21  Principal Problem:   Closed right hip fracture, initial encounter [facility]) Active Problems:   Essential hypertension   Mixed hyperlipidemia due to type 2 diabetes mellitus ([place])   Type 2 diabetes mellitus with stage 3b chronic kidney disease, with long-term current use of insulin (HCC)   Hypothyroidism   Chronic kidney disease, stage 3b (HCC)   Paroxysmal atrial fibrillation (HCC)   COPD (chronic obstructive pulmonary disease) (HCC)   Nicotine dependence, cigarettes, uncomplicated   Plan: Pain control  H/H dropped to 7 so given 1 unit PRBCs. Eliquis being held Advance diet Up with therapy Incentive Spirometry Elevate and Apply ice  Weightbearing: WBAT RLE Insicional and dressing care: chose to leave in place today due to patient pain level and extensive ecchymosis, looked fine, reinforce PRN Orthopedic device(s): None Showering: Keep dressing dry VTE prophylaxis:  Eliquis on hold d/t acute blood loss anemia  , SCDs, ambulation Pain control: continue current regimen Follow - up plan: 2 weeks post-op with Dr. Doreatha Martin   Dispo: Skilled Nursing Facility/Rehab recommended  by PT/OT.      Britt Bottom, PA-C Office [phone] 08/08/2021, 11:27 AM

## 2021-08-08 NOTE — Plan of Care (Signed)

## 2021-08-09 ENCOUNTER — Encounter (HOSPITAL_COMMUNITY): Payer: Self-pay | Admitting: Student

## 2021-08-09 DIAGNOSIS — E1122 Type 2 diabetes mellitus with diabetic chronic kidney disease: Secondary | ICD-10-CM | POA: Diagnosis not present

## 2021-08-09 DIAGNOSIS — S72001A Fracture of unspecified part of neck of right femur, initial encounter for closed fracture: Secondary | ICD-10-CM | POA: Diagnosis not present

## 2021-08-09 DIAGNOSIS — I10 Essential (primary) hypertension: Secondary | ICD-10-CM | POA: Diagnosis not present

## 2021-08-09 DIAGNOSIS — N1832 Chronic kidney disease, stage 3b: Secondary | ICD-10-CM | POA: Diagnosis not present

## 2021-08-09 LAB — TYPE AND SCREEN
ABO/RH(D): O NEG
Antibody Screen: NEGATIVE
Unit division: 0

## 2021-08-09 LAB — BPAM RBC
Blood Product Expiration Date: 202302192359
ISSUE DATE / TIME: 202302121128
Unit Type and Rh: 9500

## 2021-08-09 LAB — BASIC METABOLIC PANEL
Anion gap: 8 (ref 5–15)
BUN: 50 mg/dL — ABNORMAL HIGH (ref 8–23)
CO2: 26 mmol/L (ref 22–32)
Calcium: 8.2 mg/dL — ABNORMAL LOW (ref 8.9–10.3)
Chloride: 101 mmol/L (ref 98–111)
Creatinine, Ser: 1.67 mg/dL — ABNORMAL HIGH (ref 0.44–1.00)
GFR, Estimated: 31 mL/min — ABNORMAL LOW (ref >60)
Glucose, Bld: 157 mg/dL — ABNORMAL HIGH (ref 70–99)
Potassium: 3.6 mmol/L (ref 3.5–5.1)
Sodium: 135 mmol/L (ref 135–145)

## 2021-08-09 LAB — CBC
HCT: 26.3 % — ABNORMAL LOW (ref 36.0–46.0)
Hemoglobin: 8.9 g/dL — ABNORMAL LOW (ref 12.0–15.0)
MCH: 32.2 pg (ref 26.0–34.0)
MCHC: 33.8 g/dL (ref 30.0–36.0)
MCV: 95.3 fL (ref 80.0–100.0)
Platelets: 207 10*3/uL (ref 150–400)
RBC: 2.76 MIL/uL — ABNORMAL LOW (ref 3.87–5.11)
RDW: 14.2 % (ref 11.5–15.5)
WBC: 9.5 10*3/uL (ref 4.0–10.5)
nRBC: 0 % (ref 0.0–0.2)

## 2021-08-09 LAB — GLUCOSE, CAPILLARY
Glucose-Capillary: 168 mg/dL — ABNORMAL HIGH (ref 70–99)
Glucose-Capillary: 187 mg/dL — ABNORMAL HIGH (ref 70–99)
Glucose-Capillary: 214 mg/dL — ABNORMAL HIGH (ref 70–99)
Glucose-Capillary: 98 mg/dL (ref 70–99)

## 2021-08-09 MED ORDER — APIXABAN 2.5 MG PO TABS
2.5000 mg | ORAL_TABLET | Freq: Two times a day (BID) | ORAL | Status: DC
  Administered 2021-08-09 – 2021-08-10 (×3): 2.5 mg via ORAL
  Filled 2021-08-09 (×3): qty 1

## 2021-08-09 NOTE — Care Management Important Message (Signed)
Important Message  Patient Details  Name: Ariel Cobb MRN: [id number] Date of Birth: [birth year]   Medicare Important Message Given:  Yes     Hannah Beat 08/09/2021, 12:07 PM

## 2021-08-09 NOTE — Progress Notes (Signed)
PROGRESS NOTE    Ariel Cobb  BJS:[id number] DOB: 10-09-1945 DOA: 08/05/2021 PCP: Seward Carol, MD    Chief Complaint  Patient presents with   Fall    Brief Narrative:   76 year old female with past medical history of nicotine dependence, COPD, hypothyroidism, insulin dependent diabetes mellitus type 2, kidney disease stage IIIb, atrial fibrillation on Eliquis, hypertension, hyperlipidemia who presents to [facility] emergency department with getting a fall, and right hip and thigh pain, her work-up significant for right hip fracture.  She went for surgical repair by Dr. Doreatha Martin on 2/10.  Patient with significant anemia hemoglobin of 7 as of 2/12, transfusing 1 unit PRBC.  Assessment & Plan:   Principal Problem:   Closed right hip fracture, initial encounter [facility]) Active Problems:   Essential hypertension   Mixed hyperlipidemia due to type 2 diabetes mellitus (HCC)   Type 2 diabetes mellitus with stage 3b chronic kidney disease, with long-term current use of insulin (HCC)   Hypothyroidism   Chronic kidney disease, stage 3b (HCC)   Paroxysmal atrial fibrillation (HCC)   COPD (chronic obstructive pulmonary disease) (HCC)   Nicotine dependence, cigarettes, uncomplicated   Closed right hip fracture, initial encounter ([place])- (present on admission) - Patient suffered a mechanical fall resulting in a right intertrochanteric hip fracture as identified on x-ray and CT imaging -Status post surgical repair by Dr. Doreatha Martin on 2/10 -PT/OT consulted, recommendation for SNF placement -Vitamin D 25 hydroxy within normal limit    Paroxysmal atrial fibrillation ([place])- (present on admission) - Currently rate controlled. - Continue home regimen of rate controlling agent -We will resume back on low-dose Eliquis today for DVT prophylaxis, and increase to full anticoagulation dose once H&H is stable.   Acute blood loss anemia -With significant postoperative drop, she was on Eliquis  before surgery, 1 unit for hemoglobin of 7, improved to 8.9 today . -Monitor CBC closely as she will be resumed today on low-dose Eliquis.   COPD (chronic obstructive pulmonary disease) ([place])- (present on admission) -No active wheezing, continue with home regimen and as needed inhalers.   Type 2 diabetes mellitus with stage 3b chronic kidney disease, with long-term current use of insulin (HCC) -General insulin sliding scale and low-dose Semglee (on Lantus 8 units daily at home) -Continue with diabetic diet     Chronic kidney disease, stage 3b ([place])- (present on admission) -Creatinine near baseline, continue to monitor closely and avoid nephrotoxic medications. -Hold ramipril for now   Essential hypertension- (present on admission) - Resume patients home regimen of oral antihypertensives -will hold ramipril for now , started on low-dose Norvasc   Mixed hyperlipidemia due to type 2 diabetes mellitus ([place])- (present on admission) - Continuing home regimen of lipid lowering therapy.     Hypothyroidism- (present on admission) - Continue  home regimen of Synthroid   Nicotine dependence, cigarettes, uncomplicated- (present on admission) - Patient is being counseled daily on smoking cessation. - Providing patient with nicotine replacement therapy during this hospitalization.             DVT prophylaxis: will resume on Eliquis 2.5 mg oral twice daily Code Status: Full Family Communication: None at bedside Disposition: She will need SNF placement  Status is: Inpatient Remains inpatient appropriate because: Acute blood loss anemia, she will need SNF placement, hopefully in 48 hours as long her hemoglobin remained stable on anticoagulation.           Consultants:  orthopedic   Subjective:  Patient reports her pain  is controlled, she does report some confusion yesterday.    Objective: Vitals:   08/08/21 2026 08/09/21 0459 08/09/21 0746 08/09/21 0800  BP: (!) 108/41  (!) 146/52 (!) 154/55   Pulse: 81 71 76 78  Resp: 17 16 12 12   Temp: 98.6 F (37 C) 98.2 F (36.8 C) 98.3 F (36.8 C)   TempSrc: Oral Oral Oral   SpO2: 97% 99%  98%  Weight:      Height:        Intake/Output Summary (Last 24 hours) at 08/09/2021 1149 Last data filed at 08/09/2021 0800 Gross per 24 hour  Intake 560 ml  Output --  Net 560 ml   Filed Weights   08/06/21 1532  Weight: 58.5 kg    Examination:  Awake Alert, Oriented X 3, frail Symmetrical Chest wall movement, Good air movement bilaterally, CTAB RRR,No Gallops,Rubs or new Murmurs, No Parasternal Heave +ve B.Sounds, Abd Soft, No tenderness, No rebound - guarding or rigidity. No Cyanosis, Clubbing or edema, bandage at right hip surgical site intact, with no blood can could be seeing through, she is with significant upper extremity bruising.        Data Reviewed: I have personally reviewed following labs and imaging studies  CBC: Recent Labs  Lab 08/05/21 1847 08/06/21 0210 08/07/21 0108 08/08/21 0334 08/09/21 0043  WBC 12.0* 12.5* 11.2* 10.8* 9.5  NEUTROABS 9.9* 10.8*  --   --   --   HGB 11.3* 10.3* 8.9* 7.0* 8.9*  HCT 33.8* 30.6* 26.0* 21.2* 26.3*  MCV 97.1 96.5 96.3 96.8 95.3  PLT 307 274 232 204 701    Basic Metabolic Panel: Recent Labs  Lab 08/05/21 1847 08/06/21 0210 08/07/21 0108 08/08/21 0334 08/09/21 0043  NA 136 135 135 132* 135  K 3.8 4.0 4.4 3.8 3.6  CL 99 99 99 97* 101  CO2 27 26 26 27 26   GLUCOSE 226* 265* 218* 183* 157*  BUN 43* 43* 41* 56* 50*  CREATININE 1.60* 1.55* 1.77* 1.81* 1.67*  CALCIUM 9.1 9.1 8.6* 8.1* 8.2*  MG  --  1.8  --   --   --     GFR: Estimated Creatinine Clearance: 25.4 mL/min (A) (by C-G formula based on SCr of 1.67 mg/dL (H)).  Liver Function Tests: Recent Labs  Lab 08/06/21 0210  AST 18  ALT 15  ALKPHOS 58  BILITOT 0.6  PROT 6.4*  ALBUMIN 3.2*    CBG: Recent Labs  Lab 08/08/21 1221 08/08/21 1613 08/08/21 2025 08/09/21 0745  08/09/21 1138  GLUCAP 160* 131* 183* 187* 214*     Recent Results (from the past 240 hour(s))  Resp Panel by RT-PCR (Flu A&B, Covid) Nasopharyngeal Swab     Status: None   Collection Time: 08/05/21 10:49 PM   Specimen: Nasopharyngeal Swab; Nasopharyngeal(NP) swabs in vial transport medium  Result Value Ref Range Status   SARS Coronavirus 2 by RT PCR NEGATIVE NEGATIVE Final    Comment: (NOTE) SARS-CoV-2 target nucleic acids are NOT DETECTED.  The SARS-CoV-2 RNA is generally detectable in upper respiratory specimens during the acute phase of infection. The lowest concentration of SARS-CoV-2 viral copies this assay can detect is 138 copies/mL. A negative result does not preclude SARS-Cov-2 infection and should not be used as the sole basis for treatment or other patient management decisions. A negative result may occur with  improper specimen collection/handling, submission of specimen other than nasopharyngeal swab, presence of viral mutation(s) within the areas targeted by this assay, and  inadequate number of viral copies(<138 copies/mL). A negative result must be combined with clinical observations, patient history, and epidemiological information. The expected result is Negative.  Fact Sheet for Patients:  EntrepreneurPulse.com.au  Fact Sheet for Healthcare Providers:  IncredibleEmployment.be  This test is no t yet approved or cleared by the [place] FDA and  has been authorized for detection and/or diagnosis of SARS-CoV-2 by FDA under an Emergency Use Authorization (EUA). This EUA will remain  in effect (meaning this test can be used) for the duration of the COVID-19 declaration under Section 564(b)(1) of the Act, 21 U.S.C.section 360bbb-3(b)(1), unless the authorization is terminated  or revoked sooner.       Influenza A by PCR NEGATIVE NEGATIVE Final   Influenza B by PCR NEGATIVE NEGATIVE Final    Comment: (NOTE) The Xpert  Xpress SARS-CoV-2/FLU/RSV plus assay is intended as an aid in the diagnosis of influenza from Nasopharyngeal swab specimens and should not be used as a sole basis for treatment. Nasal washings and aspirates are unacceptable for Xpert Xpress SARS-CoV-2/FLU/RSV testing.  Fact Sheet for Patients: EntrepreneurPulse.com.au  Fact Sheet for Healthcare Providers: IncredibleEmployment.be  This test is not yet approved or cleared by the [place] FDA and has been authorized for detection and/or diagnosis of SARS-CoV-2 by FDA under an Emergency Use Authorization (EUA). This EUA will remain in effect (meaning this test can be used) for the duration of the COVID-19 declaration under Section 564(b)(1) of the Act, 21 U.S.C. section 360bbb-3(b)(1), unless the authorization is terminated or revoked.  Performed at [facility], [place] [address], [place], [place] [address]   Surgical PCR Screen     Status: None   Collection Time: 08/06/21  3:51 PM   Specimen: Nasal Mucosa; Nasal Swab  Result Value Ref Range Status   MRSA, PCR NEGATIVE NEGATIVE Final   Staphylococcus aureus NEGATIVE NEGATIVE Final    Comment: (NOTE) The Xpert SA Assay (FDA approved for NASAL specimens in patients 37 years of age and older), is one component of a comprehensive surveillance program. It is not intended to diagnose infection nor to guide or monitor treatment. Performed at [facility], [place] [address], [place], [place] [address]          Radiology Studies: No results found.      Scheduled Meds:  amLODipine  5 mg Oral Daily   apixaban  2.5 mg Oral BID   atorvastatin  80 mg Oral Daily   docusate sodium  200 mg Oral BID   fluticasone furoate-vilanterol  1 puff Inhalation Daily   insulin aspart  0-9 Units Subcutaneous TID WC   insulin glargine-yfgn  8 Units Subcutaneous Daily   levothyroxine  88 mcg Oral Q0600   metoprolol succinate  25 mg Oral Daily    Continuous Infusions:     LOS: 4 days       Phillips Climes, MD [facility]   To contact the attending provider between 7A-7P or the covering provider during after hours 7P-7A, please log into the web site [email or web address] and access using universal  password for that web site. If you do not have the password, please call the hospital operator.  08/09/2021, 11:49 AM

## 2021-08-09 NOTE — TOC Initial Note (Signed)
Transition of Care [facility]) - Initial/Assessment Note    Patient Details  Name: Ariel Cobb MRN: [id number] Date of Birth: Oct 30, 1945  Transition of [facility] CM/SW Contact:    Ariel Chars, LCSW Phone Number: 08/09/2021, 1:09 PM  Clinical Narrative:   CSW spoke with pt regarding DC recommendation for SNF,  Pt is agreeable, choice document given, permission given to send out referral in hub.  Pt does not have facility choice, does not want [facility] as had bad experience there with her sister.  Permission given to speak with sister Ariel Cobb.  Pt is vaccinated for covid with 1 or 2 boosters.  Referral sent out in hub for SNF.                Expected Discharge Plan: Skilled Nursing Facility Barriers to Discharge: Continued Medical Work up, SNF Pending bed offer   Patient Goals and CMS Choice Patient states their goals for this hospitalization and ongoing recovery are:: back to driving, being indpdendant CMS Medicare.gov Compare Post Acute Care list provided to:: Patient Choice offered to / list presented to : Patient  Expected Discharge Plan and Services Expected Discharge Plan: [place] In-house Referral: Clinical Social Work   Post Acute Care Choice: [facility] Living arrangements for the past 2 months: [place]                                      Prior Living Arrangements/Services Living arrangements for the past 2 months: Single Family Home Lives with:: Self Patient language and need for interpreter reviewed:: Yes Do you feel safe going back to the place where you live?: Yes      Need for Family Participation in Patient Care: No (Comment) Care giver support system in place?: Yes (comment) Current home services: Other (comment) (none) Criminal Activity/Legal Involvement Pertinent to Current Situation/Hospitalization: No - Comment as needed  Activities of Daily Living Home Assistive Devices/Equipment: Cane  (specify quad or straight), Walker (specify type) ADL Screening (condition at time of admission) Patient's cognitive ability adequate to safely complete daily activities?: Yes Is the patient deaf or have difficulty hearing?: No Does the patient have difficulty seeing, even when wearing glasses/contacts?: No Does the patient have difficulty concentrating, remembering, or making decisions?: No Patient able to express need for assistance with ADLs?: Yes Does the patient have difficulty dressing or bathing?: No (Not at baseline) Independently performs ADLs?: Yes (appropriate for developmental age) Does the patient have difficulty walking or climbing stairs?: Yes Weakness of Legs: Both Weakness of Arms/Hands: None  Permission Sought/Granted Permission sought to share information with : Family Supports Permission granted to share information with : Yes, Verbal Permission Granted  Share Information with NAME: sister Ariel Cobb  Permission granted to share info w AGENCY: SNF        Emotional Assessment Appearance:: Appears stated age Attitude/Demeanor/Rapport: Engaged Affect (typically observed): Appropriate, Pleasant Orientation: : Oriented to Self, Oriented to Place, Oriented to  Time, Oriented to Situation Alcohol / Substance Use: Not Applicable Psych Involvement: No (comment)  Admission diagnosis:  Fall [W19.XXXA] Fall, initial encounter [id number]] Closed fracture of right hip, initial encounter ([place]) [S72.001A] Closed right hip fracture, initial encounter ([place]) [S72.001A] Skin tear of right elbow without complication, initial encounter [S51.011A] Patient Active Problem List   Diagnosis Date Noted   Nicotine dependence, cigarettes, uncomplicated 42/70/6237   Closed right hip fracture, initial encounter (  [place]) 08/05/2021   Anticoagulated 01/28/2019   COPD (chronic obstructive pulmonary disease) ([place]) 01/28/2019   Paroxysmal atrial fibrillation ([place]) 01/10/2019   Hypothyroidism    Type  II diabetes mellitus (HCC)    Chronic kidney disease, stage 3b (HCC)    PAD (peripheral artery disease) ([place])    Essential hypertension 05/06/2013   Mixed hyperlipidemia due to type 2 diabetes mellitus ([place]) 05/06/2013   Type 2 diabetes mellitus with stage 3b chronic kidney disease, with long-term current use of insulin ([place]) 05/06/2013   Tobacco abuse 05/06/2013   Claudication ([place]) 05/06/2013   PCP:  Ariel Carol, MD Pharmacy:   [id number] [place], [place] [address] [place] [address] Phone: [phone] Fax: [phone]  [facility] [place] [place] [facility] [address] [place] [address] Phone: [phone] Fax: [phone]     Social Determinants of Health (SDOH) Interventions    Readmission Risk Interventions No flowsheet data found.

## 2021-08-09 NOTE — Progress Notes (Signed)
Occupational Therapy Treatment Patient Details Name: Ariel Cobb MRN: [id number] DOB: Sep 03, 1945 Today's Date: 08/09/2021   History of present illness Pt is a 76 y/o F presenting to ED on 2/9 after mechanical fall in driveway, found to have R hip fx. s/p IMN on 2/10. PMH includes arthritis, HLD, HTN, PAD   OT comments  Pt progressing well towards goals this session, able to ambulate to bathroom with min guard A, completes toileting/clothing mgmt with supervision/min guard. Pt with mild dizziness that resolves quickly, BP WNL. Pt presenting with impairments listed below, will follow. Continue to recommend SNF at d/c.   Recommendations for follow up therapy are one component of a multi-disciplinary discharge planning process, led by the attending physician.  Recommendations may be updated based on patient status, additional functional criteria and insurance authorization.    Follow Up Recommendations  Skilled nursing-short term rehab (<3 hours/day)    Assistance Recommended at Discharge Intermittent Supervision/Assistance  Patient can return home with the following  A little help with walking and/or transfers;A little help with bathing/dressing/bathroom;Assistance with cooking/housework   Equipment Recommendations  Other (comment);BSC/3in1 (RW)    Recommendations for Other Services PT consult    Precautions / Restrictions Precautions Precautions: Fall Restrictions Weight Bearing Restrictions: Yes RLE Weight Bearing: Weight bearing as tolerated       Mobility Bed Mobility Overal bed mobility: Needs Assistance Bed Mobility: Supine to Sit     Supine to sit: Min assist     General bed mobility comments: assist to bring RLE to EOB    Transfers Overall transfer level: Needs assistance Equipment used: Rolling walker (2 wheels) Transfers: Sit to/from Stand Sit to Stand: Min guard           General transfer comment: cues for hand placement, benefits from elevated  surface     Balance Overall balance assessment: Needs assistance Sitting-balance support: Feet supported Sitting balance-Leahy Scale: Good Sitting balance - Comments: sits unsupported EOB without LOB   Standing balance support: Reliant on assistive device for balance, During functional activity Standing balance-Leahy Scale: Poor                             ADL either performed or assessed with clinical judgement   ADL                           Toilet Transfer: Min guard;Ambulation;Regular Toilet;Rolling walker (2 wheels)   Toileting- Clothing Manipulation and Hygiene: Supervision/safety;Sitting/lateral lean Toileting - Clothing Manipulation Details (indicate cue type and reason): completes sitting/laterally leaning on commode     Functional mobility during ADLs: Minimal assistance;Rolling walker (2 wheels)      Extremity/Trunk Assessment Upper Extremity Assessment Upper Extremity Assessment: Overall WFL for tasks assessed   Lower Extremity Assessment Lower Extremity Assessment: Defer to PT evaluation        Vision   Vision Assessment?: No apparent visual deficits   Perception Perception Perception: Not tested   Praxis Praxis Praxis: Not tested    Cognition Arousal/Alertness: Awake/alert Behavior During Therapy: WFL for tasks assessed/performed Overall Cognitive Status: Within Functional Limits for tasks assessed                                          Exercises      Shoulder Instructions  General Comments VSS on RA    Pertinent Vitals/ Pain       Pain Assessment Pain Assessment: Faces Pain Score: 3  Faces Pain Scale: Hurts little more Pain Location: RLE Pain Descriptors / Indicators: Discomfort Pain Intervention(s): Limited activity within patient's tolerance, Monitored during session, Repositioned  Home Living                                          Prior Functioning/Environment               Frequency  Min 2X/week        Progress Toward Goals  OT Goals(current goals can now be found in the care plan section)  Progress towards OT goals: Progressing toward goals  Acute Rehab OT Goals Patient Stated Goal: to get better OT Goal Formulation: With patient Time For Goal Achievement: 08/21/21 Potential to Achieve Goals: Good ADL Goals Pt Will Perform Upper Body Dressing: with supervision;sitting Pt Will Perform Lower Body Dressing: with mod assist;sit to/from stand;sitting/lateral leans Pt Will Transfer to Toilet: with min guard assist;bedside commode;stand pivot transfer Pt Will Perform Tub/Shower Transfer: with min guard assist;rolling walker;3 in 1;Shower transfer;Tub transfer  Plan Discharge plan remains appropriate;Frequency remains appropriate    Co-evaluation                 AM-PAC OT "6 Clicks" Daily Activity     Outcome Measure   Help from another person eating meals?: None Help from another person taking care of personal grooming?: A Little Help from another person toileting, which includes using toliet, bedpan, or urinal?: A Lot Help from another person bathing (including washing, rinsing, drying)?: A Lot Help from another person to put on and taking off regular upper body clothing?: A Little Help from another person to put on and taking off regular lower body clothing?: A Lot 6 Click Score: 16    End of Session Equipment Utilized During Treatment: Gait belt;Rolling walker (2 wheels)  OT Visit Diagnosis: Unsteadiness on feet (R26.81);Other abnormalities of gait and mobility (R26.89);Muscle weakness (generalized) (M62.81)   Activity Tolerance Patient tolerated treatment well   Patient Left in chair;with call bell/phone within reach;with chair alarm set   Nurse Communication Mobility status        Time: 6144-3154 OT Time Calculation (min): 27 min  Charges: OT General Charges $OT Visit: 1 Visit OT Treatments $Self  Care/Home Management : 8-22 mins $Therapeutic Activity: 8-22 mins  Lynnda Child, OTD, OTR/L Acute Rehab [phone]) [phone] - [place] 08/09/2021, 3:57 PM

## 2021-08-09 NOTE — NC FL2 (Signed)
[place] LEVEL OF CARE SCREENING TOOL     IDENTIFICATION  Patient Name: Ariel Cobb Birthdate: 12/13/1945 Sex: female Admission Date (Current Location): 08/05/2021  [facility] and [place] Number:  Herbalist and Address:  [facility], [place] [address] [place], [name] [address]      Provider Number: [id number]  Attending Physician Name and Address:  Elgergawy, Silver Huguenin, MD  Relative Name and Phone Number:       Current Level of Care: Hospital Recommended Level of Care: [place] Prior Approval Number:    Date Approved/Denied:   PASRR Number: [id number]  Discharge Plan: SNF    Current Diagnoses: Patient Active Problem List   Diagnosis Date Noted   Nicotine dependence, cigarettes, uncomplicated 56/21/3086   Closed right hip fracture, initial encounter ([place]) 08/05/2021   Anticoagulated 01/28/2019   COPD (chronic obstructive pulmonary disease) ([place]) 01/28/2019   Paroxysmal atrial fibrillation ([place]) 01/10/2019   Hypothyroidism    Type II diabetes mellitus ([place])    Chronic kidney disease, stage 3b ([place])    PAD (peripheral artery disease) ([place])    Essential hypertension 05/06/2013   Mixed hyperlipidemia due to type 2 diabetes mellitus ([place]) 05/06/2013   Type 2 diabetes mellitus with stage 3b chronic kidney disease, with long-term current use of insulin ([place]) 05/06/2013   Tobacco abuse 05/06/2013   Claudication ([place]) 05/06/2013    Orientation RESPIRATION BLADDER Height & Weight     Self, Situation, Place  Normal External catheter, Incontinent Weight: 128 lb 15.5 oz (58.5 kg) Height:  5\' 5"  (165.1 cm)  BEHAVIORAL SYMPTOMS/MOOD NEUROLOGICAL BOWEL NUTRITION STATUS      Continent Diet (please see discharge summary)  AMBULATORY STATUS COMMUNICATION OF NEEDS Skin   Extensive Assist Verbally Surgical wounds (closed incision Right Leg)                       Personal Care Assistance Level of Assistance   Bathing, Feeding, Dressing Bathing Assistance: Maximum assistance Feeding assistance: Independent Dressing Assistance: Maximum assistance     Functional Limitations Info  Sight, Hearing, Speech Sight Info: Adequate Hearing Info: Adequate Speech Info: Adequate    SPECIAL CARE FACTORS FREQUENCY  PT (By licensed PT), OT (By licensed OT)     PT Frequency: 5x per week OT Frequency: 5x per week            Contractures Contractures Info: Not present    Additional Factors Info  Code Status, Allergies, Insulin Sliding Scale Code Status Info: FULL Allergies Info: Adhesive tape,Amoxicillin,Pioglitazone,latex   Insulin Sliding Scale Info: see discharge summary       Current Medications (08/09/2021):  This is the current hospital active medication list Current Facility-Administered Medications  Medication Dose Route Frequency Provider Last Rate Last Admin   acetaminophen (TYLENOL) tablet 650 mg  650 mg Oral Q6H PRN Corinne Ports, PA-C   650 mg at 08/06/21 1044   Or   acetaminophen (TYLENOL) suppository 650 mg  650 mg Rectal Q6H PRN Rushie Nyhan A, PA-C       albuterol (PROVENTIL) (2.5 MG/3ML) 0.083% nebulizer solution 2.5 mg  2.5 mg Nebulization Q4H PRN McClung, Sarah A, PA-C       amLODipine (NORVASC) tablet 5 mg  5 mg Oral Daily Elgergawy, Silver Huguenin, MD   5 mg at 08/09/21 0806   apixaban (ELIQUIS) tablet 2.5 mg  2.5 mg Oral BID Elgergawy, Silver Huguenin, MD   2.5 mg at  08/09/21 1150   atorvastatin (LIPITOR) tablet 80 mg  80 mg Oral Daily Corinne Ports, PA-C   80 mg at 08/09/21 6579   docusate sodium (COLACE) capsule 200 mg  200 mg Oral BID Elgergawy, Silver Huguenin, MD   200 mg at 08/09/21 0806   fluticasone furoate-vilanterol (BREO ELLIPTA) 200-25 MCG/ACT 1 puff  1 puff Inhalation Daily Corinne Ports, PA-C   1 puff at 08/09/21 0753   hydrALAZINE (APRESOLINE) injection 10 mg  10 mg Intravenous Q6H PRN Corinne Ports, PA-C       insulin aspart (novoLOG) injection 0-9 Units  0-9  Units Subcutaneous TID WC Corinne Ports, PA-C   3 Units at 08/09/21 1150   insulin glargine-yfgn (SEMGLEE) injection 8 Units  8 Units Subcutaneous Daily Elgergawy, Silver Huguenin, MD   8 Units at 08/09/21 1000   levothyroxine (SYNTHROID) tablet 88 mcg  88 mcg Oral Q0600 Corinne Ports, PA-C   88 mcg at 08/09/21 0526   metoCLOPramide (REGLAN) tablet 5-10 mg  5-10 mg Oral Q8H PRN Corinne Ports, PA-C       Or   metoCLOPramide (REGLAN) injection 5-10 mg  5-10 mg Intravenous Q8H PRN Corinne Ports, PA-C       metoprolol succinate (TOPROL-XL) 24 hr tablet 25 mg  25 mg Oral Daily Rushie Nyhan A, PA-C   25 mg at 08/09/21 0805   oxyCODONE-acetaminophen (PERCOCET/ROXICET) 5-325 MG per tablet 1 tablet  1 tablet Oral Q4H PRN Corinne Ports, PA-C   1 tablet at 08/08/21 1057   Or   morphine (PF) 4 MG/ML injection 4 mg  4 mg Intravenous Q4H PRN Corinne Ports, PA-C       ondansetron (ZOFRAN) tablet 4 mg  4 mg Oral Q6H PRN Corinne Ports, PA-C       Or   ondansetron (ZOFRAN) injection 4 mg  4 mg Intravenous Q6H PRN McClung, Sarah A, PA-C       polyethylene glycol (MIRALAX / GLYCOLAX) packet 17 g  17 g Oral Daily PRN McClung, Sarah A, PA-C       sodium chloride (OCEAN) 0.65 % nasal spray 1 spray  1 spray Each Nare PRN Corinne Ports, PA-C         Discharge Medications: Please see discharge summary for a list of discharge medications.  Relevant Imaging Results:  Relevant Lab Results:   Additional Information SSN [id number]. Pt is vaccinated for covid with one or two boosters.  Joanne Chars, LCSW

## 2021-08-09 NOTE — Progress Notes (Signed)
Physical Therapy Treatment Patient Details Name: Ariel Cobb MRN: [id number] DOB: 02-Sep-1945 Today's Date: 08/09/2021   History of Present Illness Pt is a 76 y/o F presenting to ED on 2/9 after mechanical fall in driveway, found to have R hip fx. s/p IMN on 2/10. PMH includes arthritis, HLD, HTN, PAD    PT Comments    Pt progressing towards physical therapy goals. Was able to perform transfers and ambulation with gross min guard assist and RW for support. Continues to be appropriate for SNF level rehab at d/c. Will continue to follow and progress as able per POC.     Recommendations for follow up therapy are one component of a multi-disciplinary discharge planning process, led by the attending physician.  Recommendations may be updated based on patient status, additional functional criteria and insurance authorization.  Follow Up Recommendations  Skilled nursing-short term rehab (<3 hours/day)     Assistance Recommended at Discharge Frequent or constant Supervision/Assistance  Patient can return home with the following Two people to help with walking and/or transfers;Assist for transportation;Help with stairs or ramp for entrance   Equipment Recommendations  Rolling walker (2 wheels)    Recommendations for Other Services       Precautions / Restrictions Precautions Precautions: Fall Restrictions Weight Bearing Restrictions: Yes RLE Weight Bearing: Weight bearing as tolerated     Mobility  Bed Mobility Overal bed mobility: Needs Assistance Bed Mobility: Supine to Sit     Supine to sit: Min guard, Min assist     General bed mobility comments: Increased time but pt was able to transition to EOB without physical assist. Close guard grossly with initial assist to advance the RLE.    Transfers Overall transfer level: Needs assistance Equipment used: Rolling walker (2 wheels) Transfers: Sit to/from Stand, Bed to chair/wheelchair/BSC Sit to Stand: Min guard   Step  pivot transfers: Min guard       General transfer comment: VC's for sequencing and general safety throughout transfers. Increased time required.    Ambulation/Gait Ambulation/Gait assistance: Min guard Gait Distance (Feet): 25 Feet Assistive device: Rolling walker (2 wheels) Gait Pattern/deviations: Step-to pattern, Decreased stride length, Trunk flexed, Narrow base of support Gait velocity: Decreased Gait velocity interpretation: 1.31 - 2.62 ft/sec, indicative of limited community ambulator   General Gait Details: Slow and generally guarded due to pain but able to ambulate around room without physical assist. Close guard for safety.   Stairs             Wheelchair Mobility    Modified Rankin (Stroke Patients Only)       Balance Overall balance assessment: Needs assistance Sitting-balance support: Feet supported Sitting balance-Leahy Scale: Fair Sitting balance - Comments: sits unsupported EOB without LOB   Standing balance support: Reliant on assistive device for balance, During functional activity Standing balance-Leahy Scale: Poor                              Cognition Arousal/Alertness: Awake/alert Behavior During Therapy: WFL for tasks assessed/performed Overall Cognitive Status: Within Functional Limits for tasks assessed                                          Exercises General Exercises - Lower Extremity Long Arc Quad: 10 reps, Seated Hip ABduction/ADduction: 10 reps, AAROM, Seated    General Comments  Pertinent Vitals/Pain Pain Assessment Pain Assessment: Faces Faces Pain Scale: Hurts little more Pain Location: RLE Pain Descriptors / Indicators: Discomfort Pain Intervention(s): Limited activity within patient's tolerance, Monitored during session, Repositioned    Home Living                          Prior Function            PT Goals (current goals can now be found in the care plan  section) Acute Rehab PT Goals Patient Stated Goal: Return home alone with intermittent support PT Goal Formulation: With patient Time For Goal Achievement: 08/21/21 Potential to Achieve Goals: Good Progress towards PT goals: Progressing toward goals    Frequency    Min 3X/week      PT Plan Current plan remains appropriate    Co-evaluation              AM-PAC PT "6 Clicks" Mobility   Outcome Measure  Help needed turning from your back to your side while in a flat bed without using bedrails?: A Little Help needed moving from lying on your back to sitting on the side of a flat bed without using bedrails?: A Little Help needed moving to and from a bed to a chair (including a wheelchair)?: A Little Help needed standing up from a chair using your arms (e.g., wheelchair or bedside chair)?: A Little Help needed to walk in hospital room?: A Little Help needed climbing 3-5 steps with a railing? : Total 6 Click Score: 16    End of Session Equipment Utilized During Treatment: Gait belt Activity Tolerance: Patient tolerated treatment well Patient left: in chair;with call bell/phone within reach;with chair alarm set;with nursing/sitter in room Nurse Communication: Mobility status PT Visit Diagnosis: Unsteadiness on feet (R26.81);Pain Pain - Right/Left: Right Pain - part of body: Hip     Time: 0902-0931 PT Time Calculation (min) (ACUTE ONLY): 29 min  Charges:  $Gait Training: 23-37 mins                     Rolinda Roan, PT, DPT Acute Rehabilitation Services Pager: [phone] Office: [phone]    Thelma Comp 08/09/2021, 11:14 AM

## 2021-08-09 NOTE — Progress Notes (Incomplete)
PROGRESS NOTE    Ariel Cobb  IEP:[id number] DOB: 10-12-1945 DOA: 08/05/2021 PCP: Seward Carol, MD    Chief Complaint  Patient presents with   Fall    Brief Narrative:   76 year old female with past medical history of nicotine dependence, COPD, hypothyroidism, insulin dependent diabetes mellitus type 2, kidney disease stage IIIb, atrial fibrillation on Eliquis, hypertension, hyperlipidemia who presents to [facility] emergency department with getting a fall, and right hip and thigh pain, her work-up significant for right hip fracture.  She went for surgical repair by Dr. Doreatha Martin on 2/10.  Patient with significant anemia hemoglobin of 7 as of 2/12, transfusing 1 unit PRBC.  Assessment & Plan:   Principal Problem:   Closed right hip fracture, initial encounter [facility]) Active Problems:   Essential hypertension   Mixed hyperlipidemia due to type 2 diabetes mellitus (HCC)   Type 2 diabetes mellitus with stage 3b chronic kidney disease, with long-term current use of insulin (HCC)   Hypothyroidism   Chronic kidney disease, stage 3b (HCC)   Paroxysmal atrial fibrillation (HCC)   COPD (chronic obstructive pulmonary disease) (HCC)   Nicotine dependence, cigarettes, uncomplicated   Closed right hip fracture, initial encounter ([place])- (present on admission) - Patient suffered a mechanical fall resulting in a right intertrochanteric hip fracture as identified on x-ray and CT imaging -Status post surgical repair by Dr. Doreatha Martin on 2/10 -PT/OT consulted, recommendation for SNF placement -Vitamin D 25 hydroxy within normal limit    Paroxysmal atrial fibrillation ([place])- (present on admission) - Currently rate controlled. - Continue home regimen of rate controlling agent -Eliquis remains on hold when acute blood loss anemia.   Acute blood loss anemia -With significant postoperative drop, she was on Eliquis before surgery, hemoglobin is 7 today, will give 1 unit PRBC today and  monitor CBC closely . -She is on Eliquis DVT prophylaxis dose 2.5 mg p.o. twice daily, I will hold today given her anemia .   COPD (chronic obstructive pulmonary disease) ([place])- (present on admission) -No active wheezing, continue with home regimen and as needed inhalers.     Type 2 diabetes mellitus with stage 3b chronic kidney disease, with long-term current use of insulin (HCC) -General insulin sliding scale and low-dose Semglee (on Lantus 8 units daily at home) - Diabetic Diet will be resumed once patient is no longer n.p.o.     Chronic kidney disease, stage 3b ([place])- (present on admission) -Creatinine near baseline, continue to monitor closely and avoid nephrotoxic medications. -Hold ramipril for now   Essential hypertension- (present on admission) - Resume patients home regimen of oral antihypertensives -We will hold ramipril for now and start on low-dose Norvasc   Mixed hyperlipidemia due to type 2 diabetes mellitus ([place])- (present on admission) - Continuing home regimen of lipid lowering therapy.     Hypothyroidism- (present on admission) - Continue  home regimen of Synthroid     Nicotine dependence, cigarettes, uncomplicated- (present on admission) - Patient is being counseled daily on smoking cessation. - Providing patient with nicotine replacement therapy during this hospitalization.             DVT prophylaxis:  Eliquis 2.5 mg po BID on hold due to acute blood loss anemia. Code Status: Full Family Communication: None at bedside Disposition: She will need SNF placement  Status is: Inpatient Remains inpatient appropriate because: Acute blood loss anemia           Consultants:  orthopedic   Subjective:  Reports right hip  pain is better controlled, she reports constipation.  Objective: Vitals:   08/08/21 2026 08/09/21 0459 08/09/21 0746 08/09/21 0800  BP: (!) 108/41 (!) 146/52 (!) 154/55   Pulse: 81 71 76 78  Resp: 17 16 12 12   Temp: 98.6 F  (37 C) 98.2 F (36.8 C) 98.3 F (36.8 C)   TempSrc: Oral Oral Oral   SpO2: 97% 99%  98%  Weight:      Height:        Intake/Output Summary (Last 24 hours) at 08/09/2021 1024 Last data filed at 08/09/2021 0800 Gross per 24 hour  Intake 560 ml  Output --  Net 560 ml   Filed Weights   08/06/21 1532  Weight: 58.5 kg    Examination:  Awake Alert, Oriented X 3, no apparent distress. Symmetrical Chest wall movement, Good air movement bilaterally, CTAB RRR,No Gallops,Rubs or new Murmurs, No Parasternal Heave +ve B.Sounds, Abd Soft, No tenderness, No rebound - guarding or rigidity. No Cyanosis, Clubbing or edema, bandage at right hip surgical site with no blood or oozing.       Data Reviewed: I have personally reviewed following labs and imaging studies  CBC: Recent Labs  Lab 08/05/21 1847 08/06/21 0210 08/07/21 0108 08/08/21 0334 08/09/21 0043  WBC 12.0* 12.5* 11.2* 10.8* 9.5  NEUTROABS 9.9* 10.8*  --   --   --   HGB 11.3* 10.3* 8.9* 7.0* 8.9*  HCT 33.8* 30.6* 26.0* 21.2* 26.3*  MCV 97.1 96.5 96.3 96.8 95.3  PLT 307 274 232 204 163    Basic Metabolic Panel: Recent Labs  Lab 08/05/21 1847 08/06/21 0210 08/07/21 0108 08/08/21 0334 08/09/21 0043  NA 136 135 135 132* 135  K 3.8 4.0 4.4 3.8 3.6  CL 99 99 99 97* 101  CO2 27 26 26 27 26   GLUCOSE 226* 265* 218* 183* 157*  BUN 43* 43* 41* 56* 50*  CREATININE 1.60* 1.55* 1.77* 1.81* 1.67*  CALCIUM 9.1 9.1 8.6* 8.1* 8.2*  MG  --  1.8  --   --   --     GFR: Estimated Creatinine Clearance: 25.4 mL/min (A) (by C-G formula based on SCr of 1.67 mg/dL (H)).  Liver Function Tests: Recent Labs  Lab 08/06/21 0210  AST 18  ALT 15  ALKPHOS 58  BILITOT 0.6  PROT 6.4*  ALBUMIN 3.2*    CBG: Recent Labs  Lab 08/08/21 1051 08/08/21 1221 08/08/21 1613 08/08/21 2025 08/09/21 0745  GLUCAP 282* 160* 131* 183* 187*     Recent Results (from the past 240 hour(s))  Resp Panel by RT-PCR (Flu A&B, Covid)  Nasopharyngeal Swab     Status: None   Collection Time: 08/05/21 10:49 PM   Specimen: Nasopharyngeal Swab; Nasopharyngeal(NP) swabs in vial transport medium  Result Value Ref Range Status   SARS Coronavirus 2 by RT PCR NEGATIVE NEGATIVE Final    Comment: (NOTE) SARS-CoV-2 target nucleic acids are NOT DETECTED.  The SARS-CoV-2 RNA is generally detectable in upper respiratory specimens during the acute phase of infection. The lowest concentration of SARS-CoV-2 viral copies this assay can detect is 138 copies/mL. A negative result does not preclude SARS-Cov-2 infection and should not be used as the sole basis for treatment or other patient management decisions. A negative result may occur with  improper specimen collection/handling, submission of specimen other than nasopharyngeal swab, presence of viral mutation(s) within the areas targeted by this assay, and inadequate number of viral copies(<138 copies/mL). A negative result must be combined with  clinical observations, patient history, and epidemiological information. The expected result is Negative.  Fact Sheet for Patients:  EntrepreneurPulse.com.au  Fact Sheet for Healthcare Providers:  IncredibleEmployment.be  This test is no t yet approved or cleared by the [place] FDA and  has been authorized for detection and/or diagnosis of SARS-CoV-2 by FDA under an Emergency Use Authorization (EUA). This EUA will remain  in effect (meaning this test can be used) for the duration of the COVID-19 declaration under Section 564(b)(1) of the Act, 21 U.S.C.section 360bbb-3(b)(1), unless the authorization is terminated  or revoked sooner.       Influenza A by PCR NEGATIVE NEGATIVE Final   Influenza B by PCR NEGATIVE NEGATIVE Final    Comment: (NOTE) The Xpert Xpress SARS-CoV-2/FLU/RSV plus assay is intended as an aid in the diagnosis of influenza from Nasopharyngeal swab specimens and should not be  used as a sole basis for treatment. Nasal washings and aspirates are unacceptable for Xpert Xpress SARS-CoV-2/FLU/RSV testing.  Fact Sheet for Patients: EntrepreneurPulse.com.au  Fact Sheet for Healthcare Providers: IncredibleEmployment.be  This test is not yet approved or cleared by the [place] FDA and has been authorized for detection and/or diagnosis of SARS-CoV-2 by FDA under an Emergency Use Authorization (EUA). This EUA will remain in effect (meaning this test can be used) for the duration of the COVID-19 declaration under Section 564(b)(1) of the Act, 21 U.S.C. section 360bbb-3(b)(1), unless the authorization is terminated or revoked.  Performed at [facility], [place] [address], [place], [place] [address]   Surgical PCR Screen     Status: None   Collection Time: 08/06/21  3:51 PM   Specimen: Nasal Mucosa; Nasal Swab  Result Value Ref Range Status   MRSA, PCR NEGATIVE NEGATIVE Final   Staphylococcus aureus NEGATIVE NEGATIVE Final    Comment: (NOTE) The Xpert SA Assay (FDA approved for NASAL specimens in patients 51 years of age and older), is one component of a comprehensive surveillance program. It is not intended to diagnose infection nor to guide or monitor treatment. Performed at [facility], [place] [address] [place], [place] [address]          Radiology Studies: No results found.      Scheduled Meds:  amLODipine  5 mg Oral Daily   apixaban  2.5 mg Oral BID   atorvastatin  80 mg Oral Daily   docusate sodium  200 mg Oral BID   fluticasone furoate-vilanterol  1 puff Inhalation Daily   insulin aspart  0-9 Units Subcutaneous TID WC   insulin glargine-yfgn  8 Units Subcutaneous Daily   levothyroxine  88 mcg Oral Q0600   metoprolol succinate  25 mg Oral Daily   Continuous Infusions:     LOS: 4 days       Phillips Climes, MD [facility]   To contact the attending provider  between 7A-7P or the covering provider during after hours 7P-7A, please log into the web site [email or web address] and access using universal [place] password for that web site. If you do not have the password, please call the hospital operator.  08/09/2021, 10:24 AM

## 2021-08-10 DIAGNOSIS — I480 Paroxysmal atrial fibrillation: Secondary | ICD-10-CM | POA: Diagnosis not present

## 2021-08-10 DIAGNOSIS — S72001A Fracture of unspecified part of neck of right femur, initial encounter for closed fracture: Secondary | ICD-10-CM | POA: Diagnosis not present

## 2021-08-10 DIAGNOSIS — I10 Essential (primary) hypertension: Secondary | ICD-10-CM | POA: Diagnosis not present

## 2021-08-10 LAB — GLUCOSE, CAPILLARY
Glucose-Capillary: 153 mg/dL — ABNORMAL HIGH (ref 70–99)
Glucose-Capillary: 179 mg/dL — ABNORMAL HIGH (ref 70–99)
Glucose-Capillary: 170 mg/dL — ABNORMAL HIGH (ref 70–99)
Glucose-Capillary: 236 mg/dL — ABNORMAL HIGH (ref 70–99)

## 2021-08-10 LAB — BASIC METABOLIC PANEL
Anion gap: 8 (ref 5–15)
BUN: 44 mg/dL — ABNORMAL HIGH (ref 8–23)
CO2: 26 mmol/L (ref 22–32)
Calcium: 8.1 mg/dL — ABNORMAL LOW (ref 8.9–10.3)
Chloride: 101 mmol/L (ref 98–111)
Creatinine, Ser: 1.56 mg/dL — ABNORMAL HIGH (ref 0.44–1.00)
GFR, Estimated: 34 mL/min — ABNORMAL LOW (ref >60)
Glucose, Bld: 158 mg/dL — ABNORMAL HIGH (ref 70–99)
Potassium: 3.9 mmol/L (ref 3.5–5.1)
Sodium: 135 mmol/L (ref 135–145)

## 2021-08-10 LAB — CBC
HCT: 27.4 % — ABNORMAL LOW (ref 36.0–46.0)
Hemoglobin: 9.1 g/dL — ABNORMAL LOW (ref 12.0–15.0)
MCH: 32 pg (ref 26.0–34.0)
MCHC: 33.2 g/dL (ref 30.0–36.0)
MCV: 96.5 fL (ref 80.0–100.0)
Platelets: 234 10*3/uL (ref 150–400)
RBC: 2.84 MIL/uL — ABNORMAL LOW (ref 3.87–5.11)
RDW: 13.8 % (ref 11.5–15.5)
WBC: 8.9 10*3/uL (ref 4.0–10.5)
nRBC: 0 % (ref 0.0–0.2)

## 2021-08-10 MED ORDER — APIXABAN 2.5 MG PO TABS
2.5000 mg | ORAL_TABLET | Freq: Two times a day (BID) | ORAL | Status: DC
  Administered 2021-08-10 – 2021-08-13 (×6): 2.5 mg via ORAL
  Filled 2021-08-10 (×6): qty 1

## 2021-08-10 MED ORDER — AMLODIPINE BESYLATE 5 MG PO TABS
5.0000 mg | ORAL_TABLET | Freq: Once | ORAL | Status: AC
  Administered 2021-08-10: 5 mg via ORAL
  Filled 2021-08-10: qty 1

## 2021-08-10 MED ORDER — OXYCODONE-ACETAMINOPHEN 5-325 MG PO TABS: 1.0000 | ORAL_TABLET | ORAL | 0 refills | Status: DC | PRN

## 2021-08-10 MED ORDER — AMLODIPINE BESYLATE 10 MG PO TABS
10.0000 mg | ORAL_TABLET | Freq: Every day | ORAL | Status: DC
  Administered 2021-08-11 – 2021-08-13 (×3): 10 mg via ORAL
  Filled 2021-08-10 (×3): qty 1

## 2021-08-10 MED ORDER — APIXABAN 5 MG PO TABS: 5.0000 mg | ORAL_TABLET | Freq: Two times a day (BID) | ORAL | Status: DC

## 2021-08-10 NOTE — Progress Notes (Signed)
PROGRESS NOTE    Ariel Cobb  JOI:[id number] DOB: 14-Jun-1946 DOA: 08/05/2021 PCP: Seward Carol, MD    Chief Complaint  Patient presents with   Fall    Brief Narrative:   76 year old female with past medical history of nicotine dependence, COPD, hypothyroidism, insulin dependent diabetes mellitus type 2, kidney disease stage IIIb, atrial fibrillation on Eliquis, hypertension, hyperlipidemia who presents to [facility] emergency department with getting a fall, and right hip and thigh pain, her work-up significant for right hip fracture.  She went for surgical repair by Dr. Doreatha Martin on 2/10.  Patient with significant anemia hemoglobin of 7 as of 2/12, transfusing 1 unit PRBC, CBC has been stable posttransfusion.  Assessment & Plan:   Principal Problem:   Closed right hip fracture, initial encounter [facility]) Active Problems:   Essential hypertension   Mixed hyperlipidemia due to type 2 diabetes mellitus (HCC)   Type 2 diabetes mellitus with stage 3b chronic kidney disease, with long-term current use of insulin (HCC)   Hypothyroidism   Chronic kidney disease, stage 3b (HCC)   Paroxysmal atrial fibrillation (HCC)   COPD (chronic obstructive pulmonary disease) (HCC)   Nicotine dependence, cigarettes, uncomplicated   Closed right hip fracture, initial encounter ([place])- (present on admission) - Patient suffered a mechanical fall resulting in a right intertrochanteric hip fracture as identified on x-ray and CT imaging -Status post surgical repair by Dr. Doreatha Martin on 2/10 -PT/OT consulted, recommendation for SNF placement -Vitamin D 25 hydroxy within normal limit    Paroxysmal atrial fibrillation ([place])- (present on admission) - Currently rate controlled. - Continue home regimen of rate controlling agent -Continue with Eliquis(dose adjusted to weight and renal function, currently on 2.5 mg p.o. twice daily)  Acute blood loss anemia -Postoperative, expected, she received 1 unit  PRBC transfusion . -CBC is stable at 9.1 today . -Continue with Eliquis(her dose should be 2.5 mg p.o., twice daily given weight less than 60 kg and creatinine> 1.5.)   COPD (chronic obstructive pulmonary disease) ([place])- (present on admission) -No active wheezing, continue with home regimen and as needed inhalers.   Type 2 diabetes mellitus with stage 3b chronic kidney disease, with long-term current use of insulin (HCC) -General insulin sliding scale and low-dose Semglee (on Lantus 8 units daily at home) -Continue with diabetic diet    Chronic kidney disease, stage 3b ([place])- (present on admission) -Creatinine near baseline, continue to monitor closely and avoid nephrotoxic medications. -Hold ramipril for now   Essential hypertension- (present on admission) - Resume patients home regimen of oral antihypertensives -will hold ramipril for now , started on low-dose Norvasc, blood pressure remains elevated will increase Norvasc.   Mixed hyperlipidemia due to type 2 diabetes mellitus ([place])- (present on admission) - Continuing home regimen of lipid lowering therapy.    Hypothyroidism- (present on admission) - Continue  home regimen of Synthroid   Nicotine dependence, cigarettes, uncomplicated- (present on admission) - Patient is being counseled daily on smoking cessation. - Providing patient with nicotine replacement therapy during this hospitalization.             DVT prophylaxis: Eliquis 2.5 mg oral twice daily daily today Code Status: Full Family Communication: None at bedside, patient was updated in details, all her questions were answered Disposition: She will need SNF placement  Status is: Inpatient Remains inpatient appropriate because: Is medically stable for discharge once insurance authorization has been obtained.   .           Consultants:  orthopedic   Subjective:  Patient reports her pain is controlled, no significant events overnight, she denies any  complaints today.    Objective: Vitals:   08/10/21 0443 08/10/21 0700 08/10/21 0731 08/10/21 0952  BP: (!) 158/61  (!) 164/54 (!) 153/68  Pulse:  72 85   Resp:  16 16   Temp: 97.9 F (36.6 C)  98 F (36.7 C)   TempSrc: Oral  Oral   SpO2:      Weight:      Height:        Intake/Output Summary (Last 24 hours) at 08/10/2021 1113 Last data filed at 08/10/2021 0902 Gross per 24 hour  Intake 480 ml  Output --  Net 480 ml   Filed Weights   08/06/21 1532  Weight: 58.5 kg    Examination:  Awake Alert, Oriented X 3, No new F.N deficits, Normal affect Symmetrical Chest wall movement, Good air movement bilaterally, CTAB RRR,No Gallops,Rubs or new Murmurs, No Parasternal Heave No Cyanosis, Clubbing or edema, bandage at right hip surgical site intact, with no blood can could be seeing through, she is with significant upper extremity bruising.        Data Reviewed: I have personally reviewed following labs and imaging studies  CBC: Recent Labs  Lab 08/05/21 1847 08/06/21 0210 08/07/21 0108 08/08/21 0334 08/09/21 0043 08/10/21 0123  WBC 12.0* 12.5* 11.2* 10.8* 9.5 8.9  NEUTROABS 9.9* 10.8*  --   --   --   --   HGB 11.3* 10.3* 8.9* 7.0* 8.9* 9.1*  HCT 33.8* 30.6* 26.0* 21.2* 26.3* 27.4*  MCV 97.1 96.5 96.3 96.8 95.3 96.5  PLT 307 274 232 204 207 790    Basic Metabolic Panel: Recent Labs  Lab 08/06/21 0210 08/07/21 0108 08/08/21 0334 08/09/21 0043 08/10/21 0123  NA 135 135 132* 135 135  K 4.0 4.4 3.8 3.6 3.9  CL 99 99 97* 101 101  CO2 26 26 27 26 26   GLUCOSE 265* 218* 183* 157* 158*  BUN 43* 41* 56* 50* 44*  CREATININE 1.55* 1.77* 1.81* 1.67* 1.56*  CALCIUM 9.1 8.6* 8.1* 8.2* 8.1*  MG 1.8  --   --   --   --     GFR: Estimated Creatinine Clearance: 27.2 mL/min (A) (by C-G formula based on SCr of 1.56 mg/dL (H)).  Liver Function Tests: Recent Labs  Lab 08/06/21 0210  AST 18  ALT 15  ALKPHOS 58  BILITOT 0.6  PROT 6.4*  ALBUMIN 3.2*     CBG: Recent Labs  Lab 08/09/21 0745 08/09/21 1138 08/09/21 1608 08/09/21 2343 08/10/21 0747  GLUCAP 187* 214* 98 168* 153*     Recent Results (from the past 240 hour(s))  Resp Panel by RT-PCR (Flu A&B, Covid) Nasopharyngeal Swab     Status: None   Collection Time: 08/05/21 10:49 PM   Specimen: Nasopharyngeal Swab; Nasopharyngeal(NP) swabs in vial transport medium  Result Value Ref Range Status   SARS Coronavirus 2 by RT PCR NEGATIVE NEGATIVE Final    Comment: (NOTE) SARS-CoV-2 target nucleic acids are NOT DETECTED.  The SARS-CoV-2 RNA is generally detectable in upper respiratory specimens during the acute phase of infection. The lowest concentration of SARS-CoV-2 viral copies this assay can detect is 138 copies/mL. A negative result does not preclude SARS-Cov-2 infection and should not be used as the sole basis for treatment or other patient management decisions. A negative result may occur with  improper specimen collection/handling, submission of specimen other than nasopharyngeal swab,  presence of viral mutation(s) within the areas targeted by this assay, and inadequate number of viral copies(<138 copies/mL). A negative result must be combined with clinical observations, patient history, and epidemiological information. The expected result is Negative.  Fact Sheet for Patients:  EntrepreneurPulse.com.au  Fact Sheet for Healthcare Providers:  IncredibleEmployment.be  This test is no t yet approved or cleared by the [place] FDA and  has been authorized for detection and/or diagnosis of SARS-CoV-2 by FDA under an Emergency Use Authorization (EUA). This EUA will remain  in effect (meaning this test can be used) for the duration of the COVID-19 declaration under Section 564(b)(1) of the Act, 21 U.S.C.section 360bbb-3(b)(1), unless the authorization is terminated  or revoked sooner.       Influenza A by PCR NEGATIVE  NEGATIVE Final   Influenza B by PCR NEGATIVE NEGATIVE Final    Comment: (NOTE) The Xpert Xpress SARS-CoV-2/FLU/RSV plus assay is intended as an aid in the diagnosis of influenza from Nasopharyngeal swab specimens and should not be used as a sole basis for treatment. Nasal washings and aspirates are unacceptable for Xpert Xpress SARS-CoV-2/FLU/RSV testing.  Fact Sheet for Patients: EntrepreneurPulse.com.au  Fact Sheet for Healthcare Providers: IncredibleEmployment.be  This test is not yet approved or cleared by the [place] FDA and has been authorized for detection and/or diagnosis of SARS-CoV-2 by FDA under an Emergency Use Authorization (EUA). This EUA will remain in effect (meaning this test can be used) for the duration of the COVID-19 declaration under Section 564(b)(1) of the Act, 21 U.S.C. section 360bbb-3(b)(1), unless the authorization is terminated or revoked.  Performed at [facility], [place] [address], [place], [place] [address]   Surgical PCR Screen     Status: None   Collection Time: 08/06/21  3:51 PM   Specimen: Nasal Mucosa; Nasal Swab  Result Value Ref Range Status   MRSA, PCR NEGATIVE NEGATIVE Final   Staphylococcus aureus NEGATIVE NEGATIVE Final    Comment: (NOTE) The Xpert SA Assay (FDA approved for NASAL specimens in patients 96 years of age and older), is one component of a comprehensive surveillance program. It is not intended to diagnose infection nor to guide or monitor treatment. Performed at [facility], [place] [address], [place], [place] [address]          Radiology Studies: No results found.      Scheduled Meds:  amLODipine  5 mg Oral Daily   apixaban  2.5 mg Oral BID   atorvastatin  80 mg Oral Daily   docusate sodium  200 mg Oral BID   fluticasone furoate-vilanterol  1 puff Inhalation Daily   insulin aspart  0-9 Units Subcutaneous TID WC   insulin glargine-yfgn  8 Units  Subcutaneous Daily   levothyroxine  88 mcg Oral Q0600   metoprolol succinate  25 mg Oral Daily   Continuous Infusions:     LOS: 5 days       Phillips Climes, MD [facility]   To contact the attending provider between 7A-7P or the covering provider during after hours 7P-7A, please log into the web site [email or web address] and access using universal [place] password for that web site. If you do not have the password, please call the hospital operator.  08/10/2021, 11:13 AM

## 2021-08-10 NOTE — Progress Notes (Signed)
Had discussion with patient about getting up by her self she verbalized understanding and bed alarm was placed. Arthor Captain LPN

## 2021-08-10 NOTE — TOC Progression Note (Signed)
Transition of Care [facility]) - Progression Note    Patient Details  Name: Ariel Cobb MRN: [id number] Date of Birth: 01-19-1946  Transition of [facility]) CM/SW Contact  Joanne Chars, LCSW Phone Number: 08/10/2021, 11:00 AM  Clinical Narrative:   CSW spoke with pt regarding bed offers and pt chooses [place].  [facility] notified, awaiting confirmation and that they can initiate insurance auth.     Expected Discharge Plan: [place] Barriers to Discharge: Continued Medical Work up, SNF Pending bed offer  Expected Discharge Plan and Services Expected Discharge Plan: [place] In-house Referral: Clinical Social Work   Post Acute Care Choice: [place] Living arrangements for the past 2 months: Single Family Home                                       Social Determinants of Health (SDOH) Interventions    Readmission Risk Interventions No flowsheet data found.

## 2021-08-10 NOTE — Plan of Care (Signed)

## 2021-08-10 NOTE — Discharge Instructions (Signed)
Orthopaedic Trauma Service Discharge Instructions   General Discharge Instructions  WEIGHT BEARING STATUS:Weightbearing as tolerated  RANGE OF MOTION/ACTIVITY:Ok for hip and knee motion as tolerated  Wound Care: Incisions can be left open to air if there is no drainage. If incision continues to have drainage, follow wound care instructions below. Okay to shower if no drainage from incisions.  DVT/PE prophylaxis:  Eliquis  Diet: as you were eating previously.  Can use over the counter stool softeners and bowel preparations, such as Miralax, to help with bowel movements.  Narcotics can be constipating.  Be sure to drink plenty of fluids  PAIN MEDICATION USE AND EXPECTATIONS  You have likely been given narcotic medications to help control your pain.  After a traumatic event that results in an fracture (broken bone) with or without surgery, it is ok to use narcotic pain medications to help control one's pain.  We understand that everyone responds to pain differently and each individual patient will be evaluated on a regular basis for the continued need for narcotic medications. Ideally, narcotic medication use should last no more than 6-8 weeks (coinciding with fracture healing).   As a patient it is your responsibility as well to monitor narcotic medication use and report the amount and frequency you use these medications when you come to your office visit.   We would also advise that if you are using narcotic medications, you should take a dose prior to therapy to maximize you participation.  IF YOU ARE ON NARCOTIC MEDICATIONS IT IS NOT PERMISSIBLE TO OPERATE A MOTOR VEHICLE (MOTORCYCLE/CAR/TRUCK/MOPED) OR HEAVY MACHINERY DO NOT MIX NARCOTICS WITH OTHER CNS (CENTRAL NERVOUS SYSTEM) DEPRESSANTS SUCH AS ALCOHOL   STOP SMOKING OR USING NICOTINE PRODUCTS!!!!  As discussed nicotine severely impairs your body's ability to heal surgical and traumatic wounds but also impairs bone healing.  Wounds  and bone heal by forming microscopic blood vessels (angiogenesis) and nicotine is a vasoconstrictor (essentially, shrinks blood vessels).  Therefore, if vasoconstriction occurs to these microscopic blood vessels they essentially disappear and are unable to deliver necessary nutrients to the healing tissue.  This is one modifiable factor that you can do to dramatically increase your chances of healing your injury.    (This means no smoking, no nicotine gum, patches, etc)  DO NOT USE NONSTEROIDAL ANTI-INFLAMMATORY DRUGS (NSAID'S)  Using products such as Advil (ibuprofen), Aleve (naproxen), Motrin (ibuprofen) for additional pain control during fracture healing can delay and/or prevent the healing response.  If you would like to take over the counter (OTC) medication, Tylenol (acetaminophen) is ok.  However, some narcotic medications that are given for pain control contain acetaminophen as well. Therefore, you should not exceed more than 4000 mg of tylenol in a day if you do not have liver disease.  Also note that there are may OTC medicines, such as cold medicines and allergy medicines that my contain tylenol as well.  If you have any questions about medications and/or interactions please ask your doctor/PA or your pharmacist.      ICE AND ELEVATE INJURED/OPERATIVE EXTREMITY  Using ice and elevating the injured extremity above your heart can help with swelling and pain control.  Icing in a pulsatile fashion, such as 20 minutes on and 20 minutes off, can be followed.    Do not place ice directly on skin. Make sure there is a barrier between to skin and the ice pack.    Using frozen items such as frozen peas works well as the conform  nicely to the are that needs to be iced.  USE AN ACE WRAP OR TED HOSE FOR SWELLING CONTROL  In addition to icing and elevation, Ace wraps or TED hose are used to help limit and resolve swelling.  It is recommended to use Ace wraps or TED hose until you are informed to stop.     When using Ace Wraps start the wrapping distally (farthest away from the body) and wrap proximally (closer to the body)   Example: If you had surgery on your leg or thing and you do not have a splint on, start the ace wrap at the toes and work your way up to the thigh        If you had surgery on your upper extremity and do not have a splint on, start the ace wrap at your fingers and work your way up to the upper arm   [place]: [phone]   VISIT OUR WEBSITE FOR ADDITIONAL INFORMATION: [email or web address]     Discharge Wound Care Instructions  Do NOT apply any ointments, solutions or lotions to pin sites or surgical wounds.  These prevent needed drainage and even though solutions like hydrogen peroxide kill bacteria, they also damage cells lining the pin sites that help fight infection.  Applying lotions or ointments can keep the wounds moist and can cause them to breakdown and open up as well. This can increase the risk for infection. When in doubt call the office.   If any drainage is noted, use foam dressings  Once the incision is completely dry and without drainage, it may be left open to air out.  Showering may begin 36-48 hours later.  Cleaning gently with soap and water.  Traumatic wounds should be dressed daily as well.    One layer of adaptic, gauze, Kerlix, then ace wrap.  The adaptic can be discontinued once the draining has ceased    If you have a wet to dry dressing: wet the gauze with saline the squeeze as much saline out so the gauze is moist (not soaking wet), place moistened gauze over wound, then place a dry gauze over the moist one, followed by Kerlix wrap, then ace wrap.

## 2021-08-11 LAB — CBC
HCT: 30.4 % — ABNORMAL LOW (ref 36.0–46.0)
Hemoglobin: 10.6 g/dL — ABNORMAL LOW (ref 12.0–15.0)
MCH: 33.1 pg (ref 26.0–34.0)
MCHC: 34.9 g/dL (ref 30.0–36.0)
MCV: 95 fL (ref 80.0–100.0)
Platelets: 299 10*3/uL (ref 150–400)
RBC: 3.2 MIL/uL — ABNORMAL LOW (ref 3.87–5.11)
RDW: 13.6 % (ref 11.5–15.5)
WBC: 8.7 10*3/uL (ref 4.0–10.5)
nRBC: 0 % (ref 0.0–0.2)

## 2021-08-11 LAB — GLUCOSE, CAPILLARY
Glucose-Capillary: 185 mg/dL — ABNORMAL HIGH (ref 70–99)
Glucose-Capillary: 168 mg/dL — ABNORMAL HIGH (ref 70–99)
Glucose-Capillary: 235 mg/dL — ABNORMAL HIGH (ref 70–99)
Glucose-Capillary: 221 mg/dL — ABNORMAL HIGH (ref 70–99)

## 2021-08-11 MED ORDER — AMLODIPINE BESYLATE 10 MG PO TABS: 10.0000 mg | ORAL_TABLET | Freq: Every day | ORAL | Status: DC

## 2021-08-11 MED ORDER — APIXABAN 2.5 MG PO TABS: 2.5000 mg | ORAL_TABLET | Freq: Two times a day (BID) | ORAL | Status: DC

## 2021-08-11 MED ORDER — DOCUSATE SODIUM 100 MG PO CAPS: 200.0000 mg | ORAL_CAPSULE | Freq: Two times a day (BID) | ORAL | 0 refills | Status: DC

## 2021-08-11 MED ORDER — POLYETHYLENE GLYCOL 3350 17 G PO PACK: 17.0000 g | PACK | Freq: Every day | ORAL | 0 refills | Status: DC

## 2021-08-11 NOTE — Progress Notes (Signed)
Occupational Therapy Treatment Patient Details Name: Ariel Cobb MRN: [id number] DOB: [birth year] Today's Date: 08/11/2021   History of present illness Pt is a 76 y/o F presenting to ED on 2/9 after mechanical fall in driveway, found to have R hip fx. s/p IMN on 2/10. PMH includes arthritis, HLD, HTN, PAD   OT comments  Pt seen today for session, wanting to sit up in chair for breakfast. Pt able to simulate LB dressing with mod A, independent with LLE, unable to simulate for RLE due to decreased ROM. Pt uses LLE to support RLE to move EOB, min A for bed mobility. Pt min guard for step pivot transfer to chair. Pt asymptomatic however BP dropped from previous reading ~30 mins prior, 135/56 immediately after transfer, 150/63 after sitting up in chair for a few mins. RN aware. Pt reporting some episodes of confusion, states she occasionally feels lightheaded and objects in room become disoriented for a short period of time, however resolves when eyes are closed for a few mins. Pt presenting with impairments listed below, will follow. Continue to recommend SNF at d/c.   Recommendations for follow up therapy are one component of a multi-disciplinary discharge planning process, led by the attending physician.  Recommendations may be updated based on patient status, additional functional criteria and insurance authorization.    Follow Up Recommendations  Skilled nursing-short term rehab (<3 hours/day)    Assistance Recommended at Discharge Intermittent Supervision/Assistance  Patient can return home with the following  A little help with walking and/or transfers;A little help with bathing/dressing/bathroom;Assistance with cooking/housework   Equipment Recommendations  Other (comment);BSC/3in1 (RW)    Recommendations for Other Services PT consult    Precautions / Restrictions Precautions Precautions: Fall Restrictions Weight Bearing Restrictions: Yes RLE Weight Bearing: Weight bearing as  tolerated       Mobility Bed Mobility Overal bed mobility: Needs Assistance Bed Mobility: Supine to Sit     Supine to sit: Min assist     General bed mobility comments: assist to bring RLE to EOB    Transfers Overall transfer level: Needs assistance Equipment used: Rolling walker (2 wheels) Transfers: Sit to/from Stand Sit to Stand: Min guard     Step pivot transfers: Min guard     General transfer comment: cues for hand placement, benefits from elevated surface     Balance Overall balance assessment: Needs assistance Sitting-balance support: Feet supported Sitting balance-Leahy Scale: Good Sitting balance - Comments: sits unsupported EOB without LOB   Standing balance support: Reliant on assistive device for balance, During functional activity Standing balance-Leahy Scale: Poor                             ADL either performed or assessed with clinical judgement   ADL Overall ADL's : Needs assistance/impaired                     Lower Body Dressing: Moderate assistance;Sitting/lateral leans Lower Body Dressing Details (indicate cue type and reason): able to simulate donning socks to LLE, does not have mobility to don RLE sock Toilet Transfer: Min guard;Rolling walker (2 wheels);Ambulation;Comfort height toilet Toilet Transfer Details (indicate cue type and reason): simulated in room to chair                Extremity/Trunk Assessment Upper Extremity Assessment Upper Extremity Assessment: Overall WFL for tasks assessed   Lower Extremity Assessment Lower Extremity Assessment: Defer to PT evaluation  Vision   Vision Assessment?: No apparent visual deficits Additional Comments: states she sometimes, has episodes of dizziness/disorientation. Describes it looks as if the room is flipped upside down.   Perception Perception Perception: Not tested   Praxis Praxis Praxis: Not tested    Cognition Arousal/Alertness:  Awake/alert Behavior During Therapy: WFL for tasks assessed/performed Overall Cognitive Status: Within Functional Limits for tasks assessed                                 General Comments: decreased awareness of deficits        Exercises      Shoulder Instructions       General Comments BP 135/56 after transfer, increased to 150/63 after sitting for a few mins, pt asymptomatic, RN aware    Pertinent Vitals/ Pain       Pain Assessment Pain Assessment: Faces Pain Score: 3  Faces Pain Scale: Hurts a little bit Pain Location: RLE Pain Descriptors / Indicators: Discomfort Pain Intervention(s): Limited activity within patient's tolerance, Monitored during session, Repositioned  Home Living                                          Prior Functioning/Environment              Frequency  Min 2X/week        Progress Toward Goals  OT Goals(current goals can now be found in the care plan section)  Progress towards OT goals: Progressing toward goals  Acute Rehab OT Goals Patient Stated Goal: to get better OT Goal Formulation: With patient Time For Goal Achievement: 08/21/21 Potential to Achieve Goals: Good ADL Goals Pt Will Perform Upper Body Dressing: with supervision;sitting Pt Will Perform Lower Body Dressing: with mod assist;sit to/from stand;sitting/lateral leans Pt Will Transfer to Toilet: with min guard assist;bedside commode;stand pivot transfer Pt Will Perform Tub/Shower Transfer: with min guard assist;rolling walker;3 in 1;Shower transfer;Tub transfer  Plan Discharge plan remains appropriate;Frequency remains appropriate    Co-evaluation                 AM-PAC OT "6 Clicks" Daily Activity     Outcome Measure   Help from another person eating meals?: None Help from another person taking care of personal grooming?: A Little Help from another person toileting, which includes using toliet, bedpan, or urinal?: A  Lot Help from another person bathing (including washing, rinsing, drying)?: A Lot Help from another person to put on and taking off regular upper body clothing?: A Little Help from another person to put on and taking off regular lower body clothing?: A Lot 6 Click Score: 16    End of Session Equipment Utilized During Treatment: Gait belt;Rolling walker (2 wheels)  OT Visit Diagnosis: Unsteadiness on feet (R26.81);Other abnormalities of gait and mobility (R26.89);Muscle weakness (generalized) (M62.81)   Activity Tolerance Patient tolerated treatment well   Patient Left in chair;with call bell/phone within reach;with chair alarm set   Nurse Communication Mobility status        Time: 0177-9390 OT Time Calculation (min): 24 min  Charges: OT General Charges $OT Visit: 1 Visit OT Treatments $Self Care/Home Management : 8-22 mins $Therapeutic Activity: 8-22 mins  Lynnda Child, OTD, OTR/L Acute Rehab [phone]) [phone] - [place] 08/11/2021, 9:39 AM

## 2021-08-11 NOTE — Discharge Summary (Signed)
[facility]  Physician Discharge Summary   Patient ID: Ariel Cobb MRN: [id number] DOB/AGE: 1946/04/02 76 y.o.  Admit date: 08/05/2021 Discharge date:   08/11/2021   PCP: Seward Carol, MD  DISCHARGE DIAGNOSES:  Principal Problem:   Closed right hip fracture, initial encounter [facility]) Active Problems:   Essential hypertension   Mixed hyperlipidemia due to type 2 diabetes mellitus ([place])   Type 2 diabetes mellitus with stage 3b chronic kidney disease, with long-term current use of insulin (HCC)   Hypothyroidism   Chronic kidney disease, stage 3b (HCC)   Paroxysmal atrial fibrillation (HCC)   COPD (chronic obstructive pulmonary disease) (HCC)   Nicotine dependence, cigarettes, uncomplicated   RECOMMENDATIONS FOR OUTPATIENT FOLLOW UP: Please check CBC and basic metabolic panel in 3 to 4 days.    Home Health: Going to SNF Equipment/Devices: None  CODE STATUS: Full code  DISCHARGE CONDITION: fair  Diet recommendation: Modified carbohydrate  INITIAL HISTORY: 76 year old female with past medical history of nicotine dependence, COPD, hypothyroidism, insulin dependent diabetes mellitus type 2, kidney disease stage IIIb, atrial fibrillation on Eliquis, hypertension, hyperlipidemia who presented to [facility] emergency department with getting a fall, and right hip and thigh pain, her work-up significant for right hip fracture.  She went for surgical repair by Dr. Doreatha Martin on 2/10.  Patient with significant anemia hemoglobin of 7 as of 2/12, transfusing 1 unit PRBC, CBC has been stable posttransfusion.  Consultations: Dr. Doreatha Martin with orthopedics  Procedures: Cephalomedullary nailing of right intertrochanteric femur fracture   HOSPITAL COURSE:   Closed right hip fracture, initial encounter ([place])- (present on admission) - Patient suffered a mechanical fall resulting in a right intertrochanteric hip fracture as identified on x-ray and CT imaging -Status post  surgical repair by Dr. Doreatha Martin on 2/10 -PT/OT consulted, recommendation for SNF placement -Vitamin D 25 hydroxy within normal limit   Paroxysmal atrial fibrillation ([place])- (present on admission) - Currently rate controlled. - Continue home regimen of rate controlling agent -Continue with Eliquis (dose adjusted to weight and renal function, currently on 2.5 mg p.o. twice daily)   Acute blood loss anemia -Postoperative, expected, she received 1 unit PRBC transfusion . Hemoglobin has been stable.   -Continue with Eliquis (her dose should be 2.5 mg p.o., twice daily given weight less than 60 kg and creatinine> 1.5.)   COPD (chronic obstructive pulmonary disease) ([place])- (present on admission) Stable.  Continue home medication   Type 2 diabetes mellitus with stage 3b chronic kidney disease, with long-term current use of insulin (HCC) -General insulin sliding scale and low-dose Semglee (on Lantus 8 units daily at home) -Continue with diabetic diet   Chronic kidney disease, stage 3b ([place])- (present on admission) -Creatinine near baseline, continue to monitor closely and avoid nephrotoxic medications. Holding ACE inhibitor and diuretic for now Monitor labs in the skilled nursing facility.   Essential hypertension- (present on admission) Stable on beta-blocker and amlodipine.  ACE inhibitor on hold as discussed above   Mixed hyperlipidemia due to type 2 diabetes mellitus ([place])- (present on admission) Continue home medications.    Hypothyroidism- (present on admission) - Continue home regimen of Synthroid   Nicotine dependence, cigarettes, uncomplicated- (present on admission) - Patient counseled daily on smoking cessation.  Patient is stable.  Okay for discharge to SNF when bed is available.   PERTINENT LABS:  The results of significant diagnostics from this hospitalization (including imaging, microbiology, ancillary and laboratory) are listed below for reference.     Microbiology: Recent Results (from  the past 240 hour(s))  Resp Panel by RT-PCR (Flu A&B, Covid) Nasopharyngeal Swab     Status: None   Collection Time: 08/05/21 10:49 PM   Specimen: Nasopharyngeal Swab; Nasopharyngeal(NP) swabs in vial transport medium  Result Value Ref Range Status   SARS Coronavirus 2 by RT PCR NEGATIVE NEGATIVE Final    Comment: (NOTE) SARS-CoV-2 target nucleic acids are NOT DETECTED.  The SARS-CoV-2 RNA is generally detectable in upper respiratory specimens during the acute phase of infection. The lowest concentration of SARS-CoV-2 viral copies this assay can detect is 138 copies/mL. A negative result does not preclude SARS-Cov-2 infection and should not be used as the sole basis for treatment or other patient management decisions. A negative result may occur with  improper specimen collection/handling, submission of specimen other than nasopharyngeal swab, presence of viral mutation(s) within the areas targeted by this assay, and inadequate number of viral copies(<138 copies/mL). A negative result must be combined with clinical observations, patient history, and epidemiological information. The expected result is Negative.  Fact Sheet for Patients:  EntrepreneurPulse.com.au  Fact Sheet for Healthcare Providers:  IncredibleEmployment.be  This test is no t yet approved or cleared by the [place] FDA and  has been authorized for detection and/or diagnosis of SARS-CoV-2 by FDA under an Emergency Use Authorization (EUA). This EUA will remain  in effect (meaning this test can be used) for the duration of the COVID-19 declaration under Section 564(b)(1) of the Act, 21 U.S.C.section 360bbb-3(b)(1), unless the authorization is terminated  or revoked sooner.       Influenza A by PCR NEGATIVE NEGATIVE Final   Influenza B by PCR NEGATIVE NEGATIVE Final    Comment: (NOTE) The Xpert Xpress SARS-CoV-2/FLU/RSV plus assay is  intended as an aid in the diagnosis of influenza from Nasopharyngeal swab specimens and should not be used as a sole basis for treatment. Nasal washings and aspirates are unacceptable for Xpert Xpress SARS-CoV-2/FLU/RSV testing.  Fact Sheet for Patients: EntrepreneurPulse.com.au  Fact Sheet for Healthcare Providers: IncredibleEmployment.be  This test is not yet approved or cleared by the [place] FDA and has been authorized for detection and/or diagnosis of SARS-CoV-2 by FDA under an Emergency Use Authorization (EUA). This EUA will remain in effect (meaning this test can be used) for the duration of the COVID-19 declaration under Section 564(b)(1) of the Act, 21 U.S.C. section 360bbb-3(b)(1), unless the authorization is terminated or revoked.  Performed at [facility], [place] [address], [place], [place] [address]   Surgical PCR Screen     Status: None   Collection Time: 08/06/21  3:51 PM   Specimen: Nasal Mucosa; Nasal Swab  Result Value Ref Range Status   MRSA, PCR NEGATIVE NEGATIVE Final   Staphylococcus aureus NEGATIVE NEGATIVE Final    Comment: (NOTE) The Xpert SA Assay (FDA approved for NASAL specimens in patients 67 years of age and older), is one component of a comprehensive surveillance program. It is not intended to diagnose infection nor to guide or monitor treatment. Performed at [facility], [place] [address] [place], [place] [address]      Labs:  COVID-19 Labs  Lab Results  Component Value Date   [place] NEGATIVE 08/05/2021      Basic Metabolic Panel: Recent Labs  Lab 08/06/21 0210 08/07/21 0108 08/08/21 0334 08/09/21 0043 08/10/21 0123  NA 135 135 132* 135 135  K 4.0 4.4 3.8 3.6 3.9  CL 99 99 97* 101 101  CO2 26 26 27  26  26  GLUCOSE 265* 218* 183* 157* 158*  BUN 43* 41* 56* 50* 44*  CREATININE 1.55* 1.77* 1.81* 1.67* 1.56*  CALCIUM 9.1 8.6* 8.1* 8.2* 8.1*  MG 1.8  --   --   --   --     Liver Function Tests: Recent Labs  Lab 08/06/21 0210  AST 18  ALT 15  ALKPHOS 58  BILITOT 0.6  PROT 6.4*  ALBUMIN 3.2*    CBC: Recent Labs  Lab 08/05/21 1847 08/06/21 0210 08/07/21 0108 08/08/21 0334 08/09/21 0043 08/10/21 0123 08/11/21 0402  WBC 12.0* 12.5* 11.2* 10.8* 9.5 8.9 8.7  NEUTROABS 9.9* 10.8*  --   --   --   --   --   HGB 11.3* 10.3* 8.9* 7.0* 8.9* 9.1* 10.6*  HCT 33.8* 30.6* 26.0* 21.2* 26.3* 27.4* 30.4*  MCV 97.1 96.5 96.3 96.8 95.3 96.5 95.0  PLT 307 274 232 204 207 234 299    CBG: Recent Labs  Lab 08/10/21 0747 08/10/21 1204 08/10/21 1240 08/10/21 1614 08/11/21 0810  GLUCAP 153* 179* 170* 236* 185*     IMAGING STUDIES DG Chest 1 View  Result Date: 08/05/2021 CLINICAL DATA:  Fall with hip fracture EXAM: CHEST  1 VIEW COMPARISON:  10/11/2015 FINDINGS: Hyperinflation. No pleural effusion or pneumothorax. Normal cardiac size. Possible partial atelectasis of left lower lobe. Aortic atherosclerosis. Old appearing bilateral rib fractures. IMPRESSION: Possible partial atelectasis at the left lower lobe, suggest two-view chest follow-up. Electronically Signed   By: Donavan Foil M.D.   On: 08/05/2021 19:41   CT Hip Right Wo Contrast  Result Date: 08/05/2021 CLINICAL DATA:  Right hip fracture, operative planning EXAM: CT OF THE RIGHT HIP WITHOUT CONTRAST TECHNIQUE: Multidetector CT imaging of the right hip was performed according to the standard protocol. Multiplanar CT image reconstructions were also generated. RADIATION DOSE REDUCTION: This exam was performed according to the departmental dose-optimization program which includes automated exposure control, adjustment of the mA and/or kV according to patient size and/or use of iterative reconstruction technique. COMPARISON:  Right hip radiographs dated 08/05/2021 FINDINGS: Impacted, mildly comminuted, intertrochanteric right hip fracture. Mild degenerative changes of the right hip. Visualized bony pelvis is  intact. Mild intramuscular hemorrhage overlying the greater trochanter. Visualized soft tissues are notable for calcified uterine fibroids. IMPRESSION: Impacted, mildly comminuted, intertrochanteric right hip fracture. Electronically Signed   By: Julian Hy M.D.   On: 08/05/2021 22:36   DG C-Arm 1-60 Min-No Report  Result Date: 08/06/2021 Fluoroscopy was utilized by the requesting physician.  No radiographic interpretation.   DG HIP PORT UNILAT W OR W/O PELVIS 1V RIGHT  Result Date: 08/06/2021 CLINICAL DATA:  Fall.  Post of radiograph. EXAM: DG HIP (WITH OR WITHOUT PELVIS) 1V PORT RIGHT COMPARISON:  Right hip CT dated 08/05/2021. FINDINGS: Status post ORIF of right femoral neck fracture with intramedullary rod and dynamic screw. The hardware is intact. There is a left hip arthroplasty. The bones are osteopenic. No acute fracture or dislocation. Arthritic changes of the right hip. Postsurgical changes of the soft tissues of the right pelvis. Calcification within the pelvis, likely related to fibroid. IMPRESSION: 1. No acute fracture or dislocation. 2. Status post ORIF of right femoral neck fracture. Electronically Signed   By: Anner Crete M.D.   On: 08/06/2021 20:37   DG Hip Unilat With Pelvis 2-3 Views Right  Result Date: 08/05/2021 CLINICAL DATA:  Right hip fracture EXAM: DG HIP (WITH OR WITHOUT PELVIS) 2-3V RIGHT COMPARISON:  None. FINDINGS: There is  an acute intratrochanteric, impacted right femoral neck fracture with mild anterior angulation of the distal fracture fragment and probable avulsion of the greater trochanter. The femoral head is still seated within the right acetabulum. There is severe superimposed right hip degenerative arthritis. Left hip bipolar hemiarthroplasty has been performed. Multiple involuted fibroids noted within the pelvis. Advanced vascular calcifications are seen. IMPRESSION: Acute intratrochanteric fracture of the right hip. Superimposed severe right hip  degenerative arthritis. Electronically Signed   By: Fidela Salisbury M.D.   On: 08/05/2021 21:13   DG FEMUR, MIN 2 VIEWS RIGHT  Result Date: 08/06/2021 CLINICAL DATA:  Right femur ORIF EXAM: RIGHT FEMUR 2 VIEWS; DG C-ARM 1-60 MIN-NO REPORT COMPARISON:  08/05/2021 FINDINGS: 5 C-arm fluoroscopic images were obtained intraoperatively and submitted for post operative interpretation. Images demonstrate placement of ORIF hardware traversing intertrochanteric right hip fracture. Final image demonstrates near anatomic fracture alignment. 1 minute and 7 seconds of fluoroscopy time was utilized. Please see the performing provider's procedural report for further detail. IMPRESSION: As above. Electronically Signed   By: Davina Poke D.O.   On: 08/06/2021 18:52   DG FEMUR, MIN 2 VIEWS RIGHT  Result Date: 08/05/2021 CLINICAL DATA:  Fall EXAM: RIGHT FEMUR 2 VIEWS COMPARISON:  None. FINDINGS: Vascular calcifications. Acute comminuted and impacted appearing proximal femoral fracture, appears to involve the basicervical region and trochanter. No femoral head dislocation. IMPRESSION: Acute comminuted and impacted proximal femoral fracture appears to involve the intertrochanteric region and basicervical region. Electronically Signed   By: Donavan Foil M.D.   On: 08/05/2021 19:40    DISCHARGE EXAMINATION: Vitals:   08/10/21 2000 08/10/21 2145 08/11/21 0807 08/11/21 0836  BP:  (!) 168/58 (!) 174/71   Pulse: 67 63 80   Resp: (!) 21 16 13    Temp:  97.6 F (36.4 C) 98.1 F (36.7 C)   TempSrc:  Oral Oral   SpO2: 97% 99% 98% 98%  Weight:      Height:       General appearance: Awake alert.  In no distress Resp: Clear to auscultation bilaterally.  Normal effort Cardio: S1-S2 is normal regular.  No S3-S4.  No rubs murmurs or bruit GI: Abdomen is soft.  Nontender nondistended.  Bowel sounds are present normal.  No masses organomegaly    DISPOSITION: SNF  Discharge Instructions     Call MD for:  difficulty  breathing, headache or visual disturbances   Complete by: As directed    Call MD for:  extreme fatigue   Complete by: As directed    Call MD for:  persistant dizziness or light-headedness   Complete by: As directed    Call MD for:  persistant nausea and vomiting   Complete by: As directed    Call MD for:  severe uncontrolled pain   Complete by: As directed    Call MD for:  temperature >100.4   Complete by: As directed    Diet - low sodium heart healthy   Complete by: As directed    Discharge instructions   Complete by: As directed    Please review instructions on the discharge summary.  You were cared for by a hospitalist during your hospital stay. If you have any questions about your discharge medications or the care you received while you were in the hospital after you are discharged, you can call the unit and asked to speak with the hospitalist on call if the hospitalist that took care of you is not available. Once you are discharged,  your primary care physician will handle any further medical issues. Please note that NO REFILLS for any discharge medications will be authorized once you are discharged, as it is imperative that you return to your primary care physician (or establish a relationship with a primary care physician if you do not have one) for your aftercare needs so that they can reassess your need for medications and monitor your lab values. If you do not have a primary care physician, you can call [phone] for a physician referral.   Discharge wound care:   Complete by: As directed    Wound care  Daily      Comments: Wound care to right elbow skin tear, partial avulsion:  Cleanse with NS, pat dry. Cover entire area with folded piece of xeroform gauze Kellie Simmering # 294), top with ABD pad and secure with a few turns of Kerlix roll gauze/paper tape. Change daily.   Increase activity slowly   Complete by: As directed         Allergies as of 08/11/2021       Reactions   Adhesive  [tape] Itching   Surgical Tape - "makes me itch and break me out" -- HAS TO USE PAPER TAPE   Amoxicillin Hives, Itching, Swelling   Latex Itching   Other Other (See Comments)   Pioglitazone Other (See Comments)        Medication List     STOP taking these medications    hydrochlorothiazide 25 MG tablet Commonly known as: HYDRODIURIL   ramipril 10 MG capsule Commonly known as: ALTACE       TAKE these medications    albuterol 108 (90 Base) MCG/ACT inhaler Commonly known as: VENTOLIN HFA Inhale into the lungs every 6 (six) hours as needed for wheezing or shortness of breath.   amLODipine 10 MG tablet Commonly known as: NORVASC Take 1 tablet (10 mg total) by mouth daily. Start taking on: August 12, 2021   apixaban 2.5 MG Tabs tablet Commonly known as: ELIQUIS Take 1 tablet (2.5 mg total) by mouth 2 (two) times daily. What changed:  medication strength how much to take   atorvastatin 80 MG tablet Commonly known as: LIPITOR Take 1 tablet (80 mg total) by mouth every morning.   budesonide-formoterol 160-4.5 MCG/ACT inhaler Commonly known as: SYMBICORT Inhale 2 puffs into the lungs 2 (two) times daily.   Calcium Carb-Cholecalciferol 600-800 MG-UNIT Tabs Take 1 tablet by mouth daily.   clobetasol 0.05 % external solution Commonly known as: TEMOVATE 1 application 2 (two) times daily as needed (rash).   docusate sodium 100 MG capsule Commonly known as: COLACE Take 2 capsules (200 mg total) by mouth 2 (two) times daily.   ferrous sulfate 325 (65 FE) MG tablet Take 325 mg by mouth 3 (three) times a week. Mon, Wed, Friday   insulin glargine 100 UNIT/ML Solostar Pen Commonly known as: LANTUS Inject 8 Units into the skin daily at 2 PM.   levothyroxine 88 MCG tablet Commonly known as: SYNTHROID Take 88 mcg by mouth daily before breakfast.   metoprolol succinate 25 MG 24 hr tablet Commonly known as: TOPROL-XL TAKE 1 TABLET BY MOUTH EVERY DAY What changed:  how to take this   NovoLOG FlexPen 100 UNIT/ML FlexPen Generic drug: insulin aspart Inject 4-6 Units into the skin 3 (three) times daily. Inject 4 units in the morning, 5 units at lunch.   6 units in the evening  (Sliding scale)   ONE TOUCH ULTRA TEST test strip  Generic drug: glucose blood TEST THREE TIMES DAILY E11.22   oxyCODONE-acetaminophen 5-325 MG tablet Commonly known as: PERCOCET/ROXICET Take 1 tablet by mouth every 4 (four) hours as needed for moderate pain or severe pain.   polyethylene glycol 17 g packet Commonly known as: MIRALAX / GLYCOLAX Take 17 g by mouth daily.   sodium chloride 0.65 % Soln nasal spray Commonly known as: OCEAN Place 1 spray into both nostrils as needed for congestion.               Discharge Care Instructions  (From admission, onward)           Start     Ordered   08/11/21 0000  Discharge wound care:       Comments: Wound care  Daily      Comments: Wound care to right elbow skin tear, partial avulsion:  Cleanse with NS, pat dry. Cover entire area with folded piece of xeroform gauze Kellie Simmering # 294), top with ABD pad and secure with a few turns of Kerlix roll gauze/paper tape. Change daily.   08/11/21 3716              Follow-up Information     Haddix, Thomasene Lot, MD. Schedule an appointment as soon as possible for a visit in 2 week(s).   Specialty: Orthopedic Surgery Why: for wound check and repeat x-rays Contact information: [place] [address] [phone]         Seward Carol, MD. Schedule an appointment as soon as possible for a visit in 1 week(s).   Specialty: Internal Medicine Contact information: [address] [place] [address] [phone]                 TOTAL DISCHARGE TIME: 59 minutes  [facility] Pager on [email or web address]  08/11/2021, 9:37 AM

## 2021-08-11 NOTE — TOC Progression Note (Signed)
Transition of Care [facility]) - Progression Note    Patient Details  Name: TALAH COOKSTON MRN: [id number] Date of Birth: [birth year]  [facility] CM/SW Contact  Joanne Chars, LCSW Phone Number: 08/11/2021, 1:18 PM  Clinical Narrative:   Message from [place]: auth still pending.     Expected Discharge Plan: [place] Barriers to Discharge: Continued Medical Work up, SNF Pending bed offer  Expected Discharge Plan and Services Expected Discharge Plan: [place] In-house Referral: Clinical Social Work   Post Acute Care Choice: [place] arrangements for the past 2 months: Single Family Home Expected Discharge Date: 08/11/21                                     Social Determinants of Health (SDOH) Interventions    Readmission Risk Interventions No flowsheet data found.

## 2021-08-11 NOTE — Progress Notes (Signed)
Mobility Specialist Progress Note    08/11/21 1447  Mobility  Bed Position Chair  Activity Ambulated with assistance in room  Level of Assistance Contact guard assist, steadying assist  Assistive Device Front wheel walker  Distance Ambulated (ft) 45 ft  Activity Response Tolerated fair  $Mobility charge 1 Mobility   Pre-Mobility: 79 HR, 98% SpO2 Post-Mobility: 89 HR, 98% SpO2  Pt received in chair and agreeable. No complaints on walk. Returned to chair with call bell in reach.   [facility] Mobility Specialist  M.S. 5N: [phone]

## 2021-08-11 NOTE — Progress Notes (Signed)
Physical Therapy Treatment Patient Details Name: Ariel Cobb MRN: [id number] DOB: 1945/08/26 Today's Date: 08/11/2021   History of Present Illness Pt is a 76 y/o F presenting to ED on 2/9 after mechanical fall in driveway, found to have R hip fx. s/p IMN on 2/10. PMH includes arthritis, HLD, HTN, PAD    PT Comments    Pt received OOB in chair and agreeable to session with good tolerance. Min guard needed for transfers and ambulation for cues for sequencing and safety. Anticipate safe discharge to venue noted below once medically cleared. Pt continues to benefit from skilled PT services to progress toward functional mobility goals.     Recommendations for follow up therapy are one component of a multi-disciplinary discharge planning process, led by the attending physician.  Recommendations may be updated based on patient status, additional functional criteria and insurance authorization.  Follow Up Recommendations  Skilled nursing-short term rehab (<3 hours/day)     Assistance Recommended at Discharge Frequent or constant Supervision/Assistance  Patient can return home with the following Two people to help with walking and/or transfers;Assist for transportation;Help with stairs or ramp for entrance   Equipment Recommendations  Rolling walker (2 wheels)    Recommendations for Other Services       Precautions / Restrictions Precautions Precautions: Fall Restrictions Weight Bearing Restrictions: Yes RLE Weight Bearing: Weight bearing as tolerated     Mobility  Bed Mobility               General bed mobility comments: pt OOB in recliner upon arrival    Transfers Overall transfer level: Needs assistance Equipment used: Rolling walker (2 wheels) Transfers: Sit to/from Stand Sit to Stand: Min guard           General transfer comment: cues for hand placement, increased time needed to come to full standing, poor eccentric control coming to sitting     Ambulation/Gait Ambulation/Gait assistance: Min guard Gait Distance (Feet): 30 Feet Assistive device: Rolling walker (2 wheels) Gait Pattern/deviations: Step-to pattern, Decreased step length - right, Decreased weight shift to right, Antalgic, Trunk flexed Gait velocity: Decreased     General Gait Details: Slow and generally guarded due to pain but able to ambulate around room without physical assist. Close guard for safety and cues for sequencing to limit pain in RLE   Stairs             Wheelchair Mobility    Modified Rankin (Stroke Patients Only)       Balance Overall balance assessment: Needs assistance Sitting-balance support: Feet supported Sitting balance-Leahy Scale: Good Sitting balance - Comments: sits unsupported at edge of recliner without LOB   Standing balance support: Reliant on assistive device for balance, During functional activity Standing balance-Leahy Scale: Poor                              Cognition Arousal/Alertness: Awake/alert Behavior During Therapy: WFL for tasks assessed/performed Overall Cognitive Status: Within Functional Limits for tasks assessed                                 General Comments: decreased awareness of deficits        Exercises General Exercises - Lower Extremity Ankle Circles/Pumps: AROM, Both, 20 reps, Seated Long Arc Quad: AROM, Right, 10 reps, Seated Hip ABduction/ADduction: AROM, Both, 10 reps, Seated (with graded manual resistance) Hip  Flexion/Marching: AROM, Both, 20 reps, Seated    General Comments General comments (skin integrity, edema, etc.): VSS on RA      Pertinent Vitals/Pain Pain Assessment Pain Assessment: 0-10 Pain Score: 7  Pain Location: R hip when standing Pain Descriptors / Indicators: Discomfort, Aching Pain Intervention(s): Limited activity within patient's tolerance, Monitored during session, Repositioned    Home Living                           Prior Function            PT Goals (current goals can now be found in the care plan section) Acute Rehab PT Goals PT Goal Formulation: With patient Time For Goal Achievement: 08/21/21 Potential to Achieve Goals: Good    Frequency    Min 3X/week      PT Plan Current plan remains appropriate    Co-evaluation              AM-PAC PT "6 Clicks" Mobility   Outcome Measure  Help needed turning from your back to your side while in a flat bed without using bedrails?: A Little Help needed moving from lying on your back to sitting on the side of a flat bed without using bedrails?: A Little Help needed moving to and from a bed to a chair (including a wheelchair)?: A Little Help needed standing up from a chair using your arms (e.g., wheelchair or bedside chair)?: A Little Help needed to walk in hospital room?: A Little Help needed climbing 3-5 steps with a railing? : Total 6 Click Score: 16    End of Session Equipment Utilized During Treatment: Gait belt Activity Tolerance: Patient tolerated treatment well Patient left: in chair;with call bell/phone within reach;with chair alarm set Nurse Communication: Mobility status PT Visit Diagnosis: Unsteadiness on feet (R26.81);Pain Pain - Right/Left: Right Pain - part of body: Hip     Time: 7628-3151 PT Time Calculation (min) (ACUTE ONLY): 19 min  Charges:  $Gait Training: 8-22 mins                     Lewis Grivas R. PTA Acute Rehabilitation Services Office: [place] 08/11/2021, 11:07 AM

## 2021-08-12 LAB — GLUCOSE, CAPILLARY
Glucose-Capillary: 158 mg/dL — ABNORMAL HIGH (ref 70–99)
Glucose-Capillary: 155 mg/dL — ABNORMAL HIGH (ref 70–99)
Glucose-Capillary: 152 mg/dL — ABNORMAL HIGH (ref 70–99)
Glucose-Capillary: 124 mg/dL — ABNORMAL HIGH (ref 70–99)

## 2021-08-12 NOTE — Care Management Important Message (Signed)
Important Message  Patient Details  Name: Ariel Cobb MRN: [id number] Date of Birth: 1945-11-13   Medicare Important Message Given:  Yes     Hannah Beat 08/12/2021, 12:43 PM

## 2021-08-12 NOTE — Progress Notes (Signed)
Patient was not discharged yesterday as we are still waiting on insurance authorization for her skilled nursing facility placement for rehab. Patient noted to be stable for the most part. Denies any complaints. Vital signs are stable. Continue management as per plan in the discharge summary from yesterday.  Bonnielee Haff 08/12/2021

## 2021-08-12 NOTE — Progress Notes (Signed)
Mobility Specialist Progress Note    08/12/21 1026  Mobility  Bed Position Chair  Activity Ambulated with assistance in room  Level of Assistance Minimal assist, patient does 75% or more  Assistive Device Front wheel walker  Distance Ambulated (ft) 80 ft  Activity Response Tolerated fair  $Mobility charge 1 Mobility   Pt received in bed and agreeable. Had BM in BR. Took x1 seated rest break before ambulating. Left in chair with call bell in reach.   [facility] Mobility Specialist  M.S. 5N: [phone]

## 2021-08-13 DIAGNOSIS — I959 Hypotension, unspecified: Secondary | ICD-10-CM | POA: Diagnosis not present

## 2021-08-13 DIAGNOSIS — I10 Essential (primary) hypertension: Secondary | ICD-10-CM | POA: Diagnosis not present

## 2021-08-13 DIAGNOSIS — S41111A Laceration without foreign body of right upper arm, initial encounter: Secondary | ICD-10-CM | POA: Diagnosis not present

## 2021-08-13 DIAGNOSIS — E782 Mixed hyperlipidemia: Secondary | ICD-10-CM | POA: Diagnosis not present

## 2021-08-13 DIAGNOSIS — Z23 Encounter for immunization: Secondary | ICD-10-CM | POA: Diagnosis present

## 2021-08-13 DIAGNOSIS — R69 Illness, unspecified: Secondary | ICD-10-CM | POA: Diagnosis not present

## 2021-08-13 DIAGNOSIS — L853 Xerosis cutis: Secondary | ICD-10-CM | POA: Diagnosis not present

## 2021-08-13 DIAGNOSIS — R5383 Other fatigue: Secondary | ICD-10-CM | POA: Diagnosis not present

## 2021-08-13 DIAGNOSIS — J449 Chronic obstructive pulmonary disease, unspecified: Secondary | ICD-10-CM | POA: Diagnosis not present

## 2021-08-13 DIAGNOSIS — S72001D Fracture of unspecified part of neck of right femur, subsequent encounter for closed fracture with routine healing: Secondary | ICD-10-CM | POA: Diagnosis not present

## 2021-08-13 DIAGNOSIS — E119 Type 2 diabetes mellitus without complications: Secondary | ICD-10-CM | POA: Diagnosis not present

## 2021-08-13 DIAGNOSIS — R2243 Localized swelling, mass and lump, lower limb, bilateral: Secondary | ICD-10-CM | POA: Diagnosis not present

## 2021-08-13 DIAGNOSIS — Z7401 Bed confinement status: Secondary | ICD-10-CM | POA: Diagnosis not present

## 2021-08-13 DIAGNOSIS — E039 Hypothyroidism, unspecified: Secondary | ICD-10-CM | POA: Diagnosis not present

## 2021-08-13 DIAGNOSIS — W010XXA Fall on same level from slipping, tripping and stumbling without subsequent striking against object, initial encounter: Secondary | ICD-10-CM | POA: Diagnosis not present

## 2021-08-13 DIAGNOSIS — S72001A Fracture of unspecified part of neck of right femur, initial encounter for closed fracture: Secondary | ICD-10-CM | POA: Diagnosis not present

## 2021-08-13 DIAGNOSIS — D75839 Thrombocytosis, unspecified: Secondary | ICD-10-CM | POA: Diagnosis not present

## 2021-08-13 DIAGNOSIS — I739 Peripheral vascular disease, unspecified: Secondary | ICD-10-CM | POA: Diagnosis not present

## 2021-08-13 DIAGNOSIS — Z7901 Long term (current) use of anticoagulants: Secondary | ICD-10-CM | POA: Diagnosis not present

## 2021-08-13 DIAGNOSIS — E1165 Type 2 diabetes mellitus with hyperglycemia: Secondary | ICD-10-CM | POA: Diagnosis not present

## 2021-08-13 DIAGNOSIS — I480 Paroxysmal atrial fibrillation: Secondary | ICD-10-CM | POA: Diagnosis not present

## 2021-08-13 DIAGNOSIS — D649 Anemia, unspecified: Secondary | ICD-10-CM | POA: Diagnosis not present

## 2021-08-13 LAB — GLUCOSE, CAPILLARY
Glucose-Capillary: 126 mg/dL — ABNORMAL HIGH (ref 70–99)
Glucose-Capillary: 157 mg/dL — ABNORMAL HIGH (ref 70–99)

## 2021-08-13 NOTE — Plan of Care (Signed)

## 2021-08-13 NOTE — TOC Transition Note (Signed)
Transition of Care [facility]) - CM/SW Discharge Note   Patient Details  Name: Ariel Cobb MRN: [id number] Date of Birth: 01-Oct-1945  Transition of Care [facility]) CM/SW Contact:  Joanne Chars, LCSW Phone Number: 08/13/2021, 10:02 AM   Clinical Narrative:   Pt discharging to [facility] room 502.  RN call [phone] for report.  Room not ready at [facility] until after 1200, [facility] called for 1130 pick up.     Final next level of care: Skilled Nursing Facility Barriers to Discharge: Barriers Resolved   Patient Goals and CMS Choice Patient states their goals for this hospitalization and ongoing recovery are:: back to driving, being indpdendant CMS Medicare.gov Compare Post Acute Care list provided to:: Patient Choice offered to / list presented to : Patient  Discharge Placement              Patient chooses bed at:  [facility]) Patient to be transferred to facility by: [place] Name of family member notified: unable to reach sister Patient and family notified of of transfer: 08/13/21 (pt only)  Discharge Plan and Services In-house Referral: Clinical Social Work   Post Acute Care Choice: [place]                               Social Determinants of Health ([place]) Interventions     Readmission Risk Interventions No flowsheet data found.

## 2021-08-13 NOTE — Discharge Summary (Signed)
[facility]  Physician Discharge Summary   Patient ID: Ariel Cobb MRN: [id number] DOB/AGE: [birth year] 76 y.o.  Admit date: 08/05/2021 Discharge date:   08/13/2021   PCP: Seward Carol, MD  DISCHARGE DIAGNOSES:  Principal Problem:   Closed right hip fracture, initial encounter [facility]) Active Problems:   Essential hypertension   Mixed hyperlipidemia due to type 2 diabetes mellitus ([place])   Type 2 diabetes mellitus with stage 3b chronic kidney disease, with long-term current use of insulin (HCC)   Hypothyroidism   Chronic kidney disease, stage 3b (HCC)   Paroxysmal atrial fibrillation (HCC)   COPD (chronic obstructive pulmonary disease) (HCC)   Nicotine dependence, cigarettes, uncomplicated   RECOMMENDATIONS FOR OUTPATIENT FOLLOW UP: Please check CBC and basic metabolic panel in 3 to 4 days.    Home Health: Going to SNF Equipment/Devices: None  CODE STATUS: Full code  DISCHARGE CONDITION: fair  Diet recommendation: Modified carbohydrate  INITIAL HISTORY: 76 year old female with past medical history of nicotine dependence, COPD, hypothyroidism, insulin dependent diabetes mellitus type 2, kidney disease stage IIIb, atrial fibrillation on Eliquis, hypertension, hyperlipidemia who presented to [facility] emergency department with getting a fall, and right hip and thigh pain, her work-up significant for right hip fracture.  She went for surgical repair by Dr. Doreatha Martin on 2/10.  Patient with significant anemia hemoglobin of 7 as of 2/12, transfusing 1 unit PRBC, CBC has been stable posttransfusion.  Consultations: Dr. Doreatha Martin with orthopedics  Procedures: Cephalomedullary nailing of right intertrochanteric femur fracture   HOSPITAL COURSE:   Closed right hip fracture, initial encounter ([place])- (present on admission) - Patient suffered a mechanical fall resulting in a right intertrochanteric hip fracture as identified on x-ray and CT imaging -Status post  surgical repair by Dr. Doreatha Martin on 2/10 -PT/OT consulted, recommendation for SNF placement -Vitamin D 25 hydroxy within normal limit   Paroxysmal atrial fibrillation ([place])- (present on admission) - Currently rate controlled. - Continue home regimen of rate controlling agent -Continue with Eliquis (dose adjusted to weight and renal function, currently on 2.5 mg p.o. twice daily)   Acute blood loss anemia -Postoperative, expected, she received 1 unit PRBC transfusion . Hemoglobin has been stable.   -Continue with Eliquis (her dose should be 2.5 mg p.o., twice daily given weight less than 60 kg and creatinine> 1.5.)   COPD (chronic obstructive pulmonary disease) ([place])- (present on admission) Stable.  Continue home medication   Type 2 diabetes mellitus with stage 3b chronic kidney disease, with long-term current use of insulin (HCC) -General insulin sliding scale and low-dose Semglee (on Lantus 8 units daily at home) -Continue with diabetic diet   Chronic kidney disease, stage 3b ([place])- (present on admission) -Creatinine near baseline, continue to monitor closely and avoid nephrotoxic medications. Holding ACE inhibitor and diuretic for now Monitor labs in the skilled nursing facility.   Essential hypertension- (present on admission) Stable on beta-blocker and amlodipine.  ACE inhibitor on hold as discussed above   Mixed hyperlipidemia due to type 2 diabetes mellitus ([place])- (present on admission) Continue home medications.    Hypothyroidism- (present on admission) - Continue home regimen of Synthroid   Nicotine dependence, cigarettes, uncomplicated- (present on admission) - Patient counseled daily on smoking cessation.  Patient is stable.  Okay for discharge to SNF when bed is available.   PERTINENT LABS:  The results of significant diagnostics from this hospitalization (including imaging, microbiology, ancillary and laboratory) are listed below for reference.     Microbiology: Recent Results (from  the past 240 hour(s))  Resp Panel by RT-PCR (Flu A&B, Covid) Nasopharyngeal Swab     Status: None   Collection Time: 08/05/21 10:49 PM   Specimen: Nasopharyngeal Swab; Nasopharyngeal(NP) swabs in vial transport medium  Result Value Ref Range Status   SARS Coronavirus 2 by RT PCR NEGATIVE NEGATIVE Final    Comment: (NOTE) SARS-CoV-2 target nucleic acids are NOT DETECTED.  The SARS-CoV-2 RNA is generally detectable in upper respiratory specimens during the acute phase of infection. The lowest concentration of SARS-CoV-2 viral copies this assay can detect is 138 copies/mL. A negative result does not preclude SARS-Cov-2 infection and should not be used as the sole basis for treatment or other patient management decisions. A negative result may occur with  improper specimen collection/handling, submission of specimen other than nasopharyngeal swab, presence of viral mutation(s) within the areas targeted by this assay, and inadequate number of viral copies(<138 copies/mL). A negative result must be combined with clinical observations, patient history, and epidemiological information. The expected result is Negative.  Fact Sheet for Patients:  EntrepreneurPulse.com.au  Fact Sheet for Healthcare Providers:  IncredibleEmployment.be  This test is no t yet approved or cleared by the [place] FDA and  has been authorized for detection and/or diagnosis of SARS-CoV-2 by FDA under an Emergency Use Authorization (EUA). This EUA will remain  in effect (meaning this test can be used) for the duration of the COVID-19 declaration under Section 564(b)(1) of the Act, 21 U.S.C.section 360bbb-3(b)(1), unless the authorization is terminated  or revoked sooner.       Influenza A by PCR NEGATIVE NEGATIVE Final   Influenza B by PCR NEGATIVE NEGATIVE Final    Comment: (NOTE) The Xpert Xpress SARS-CoV-2/FLU/RSV plus assay is  intended as an aid in the diagnosis of influenza from Nasopharyngeal swab specimens and should not be used as a sole basis for treatment. Nasal washings and aspirates are unacceptable for Xpert Xpress SARS-CoV-2/FLU/RSV testing.  Fact Sheet for Patients: EntrepreneurPulse.com.au  Fact Sheet for Healthcare Providers: IncredibleEmployment.be  This test is not yet approved or cleared by the [place] FDA and has been authorized for detection and/or diagnosis of SARS-CoV-2 by FDA under an Emergency Use Authorization (EUA). This EUA will remain in effect (meaning this test can be used) for the duration of the COVID-19 declaration under Section 564(b)(1) of the Act, 21 U.S.C. section 360bbb-3(b)(1), unless the authorization is terminated or revoked.  Performed at [facility], [place] [address] [place], [place] [address]   Surgical PCR Screen     Status: None   Collection Time: 08/06/21  3:51 PM   Specimen: Nasal Mucosa; Nasal Swab  Result Value Ref Range Status   MRSA, PCR NEGATIVE NEGATIVE Final   Staphylococcus aureus NEGATIVE NEGATIVE Final    Comment: (NOTE) The Xpert SA Assay (FDA approved for NASAL specimens in patients 36 years of age and older), is one component of a comprehensive surveillance program. It is not intended to diagnose infection nor to guide or monitor treatment. Performed at [facility], [place] [address], [place], [place] [address]      Labs:  COVID-19 Labs  Lab Results  Component Value Date   [place] 08/05/2021      Basic Metabolic Panel: Recent Labs  Lab 08/07/21 0108 08/08/21 0334 08/09/21 0043 08/10/21 0123  NA 135 132* 135 135  K 4.4 3.8 3.6 3.9  CL 99 97* 101 101  CO2 26 27 26 26   GLUCOSE 218* 183* 157*  158*  BUN 41* 56* 50* 44*  CREATININE 1.77* 1.81* 1.67* 1.56*  CALCIUM 8.6* 8.1* 8.2* 8.1*     CBC: Recent Labs  Lab 08/07/21 0108 08/08/21 0334 08/09/21 0043  08/10/21 0123 08/11/21 0402  WBC 11.2* 10.8* 9.5 8.9 8.7  HGB 8.9* 7.0* 8.9* 9.1* 10.6*  HCT 26.0* 21.2* 26.3* 27.4* 30.4*  MCV 96.3 96.8 95.3 96.5 95.0  PLT 232 204 207 234 299    CBG: Recent Labs  Lab 08/12/21 0737 08/12/21 1154 08/12/21 1615 08/12/21 2048 08/13/21 0642  GLUCAP 158* 155* 152* 124* 126*     IMAGING STUDIES DG Chest 1 View  Result Date: 08/05/2021 CLINICAL DATA:  Fall with hip fracture EXAM: CHEST  1 VIEW COMPARISON:  10/11/2015 FINDINGS: Hyperinflation. No pleural effusion or pneumothorax. Normal cardiac size. Possible partial atelectasis of left lower lobe. Aortic atherosclerosis. Old appearing bilateral rib fractures. IMPRESSION: Possible partial atelectasis at the left lower lobe, suggest two-view chest follow-up. Electronically Signed   By: Donavan Foil M.D.   On: 08/05/2021 19:41   CT Hip Right Wo Contrast  Result Date: 08/05/2021 CLINICAL DATA:  Right hip fracture, operative planning EXAM: CT OF THE RIGHT HIP WITHOUT CONTRAST TECHNIQUE: Multidetector CT imaging of the right hip was performed according to the standard protocol. Multiplanar CT image reconstructions were also generated. RADIATION DOSE REDUCTION: This exam was performed according to the departmental dose-optimization program which includes automated exposure control, adjustment of the mA and/or kV according to patient size and/or use of iterative reconstruction technique. COMPARISON:  Right hip radiographs dated 08/05/2021 FINDINGS: Impacted, mildly comminuted, intertrochanteric right hip fracture. Mild degenerative changes of the right hip. Visualized bony pelvis is intact. Mild intramuscular hemorrhage overlying the greater trochanter. Visualized soft tissues are notable for calcified uterine fibroids. IMPRESSION: Impacted, mildly comminuted, intertrochanteric right hip fracture. Electronically Signed   By: Julian Hy M.D.   On: 08/05/2021 22:36   DG C-Arm 1-60 Min-No Report  Result Date:  08/06/2021 Fluoroscopy was utilized by the requesting physician.  No radiographic interpretation.   DG HIP PORT UNILAT W OR W/O PELVIS 1V RIGHT  Result Date: 08/06/2021 CLINICAL DATA:  Fall.  Post of radiograph. EXAM: DG HIP (WITH OR WITHOUT PELVIS) 1V PORT RIGHT COMPARISON:  Right hip CT dated 08/05/2021. FINDINGS: Status post ORIF of right femoral neck fracture with intramedullary rod and dynamic screw. The hardware is intact. There is a left hip arthroplasty. The bones are osteopenic. No acute fracture or dislocation. Arthritic changes of the right hip. Postsurgical changes of the soft tissues of the right pelvis. Calcification within the pelvis, likely related to fibroid. IMPRESSION: 1. No acute fracture or dislocation. 2. Status post ORIF of right femoral neck fracture. Electronically Signed   By: Anner Crete M.D.   On: 08/06/2021 20:37   DG Hip Unilat With Pelvis 2-3 Views Right  Result Date: 08/05/2021 CLINICAL DATA:  Right hip fracture EXAM: DG HIP (WITH OR WITHOUT PELVIS) 2-3V RIGHT COMPARISON:  None. FINDINGS: There is an acute intratrochanteric, impacted right femoral neck fracture with mild anterior angulation of the distal fracture fragment and probable avulsion of the greater trochanter. The femoral head is still seated within the right acetabulum. There is severe superimposed right hip degenerative arthritis. Left hip bipolar hemiarthroplasty has been performed. Multiple involuted fibroids noted within the pelvis. Advanced vascular calcifications are seen. IMPRESSION: Acute intratrochanteric fracture of the right hip. Superimposed severe right hip degenerative arthritis. Electronically Signed   By: Linwood Dibbles.D.  On: 08/05/2021 21:13   DG FEMUR, MIN 2 VIEWS RIGHT  Result Date: 08/06/2021 CLINICAL DATA:  Right femur ORIF EXAM: RIGHT FEMUR 2 VIEWS; DG C-ARM 1-60 MIN-NO REPORT COMPARISON:  08/05/2021 FINDINGS: 5 C-arm fluoroscopic images were obtained intraoperatively and submitted  for post operative interpretation. Images demonstrate placement of ORIF hardware traversing intertrochanteric right hip fracture. Final image demonstrates near anatomic fracture alignment. 1 minute and 7 seconds of fluoroscopy time was utilized. Please see the performing provider's procedural report for further detail. IMPRESSION: As above. Electronically Signed   By: Davina Poke D.O.   On: 08/06/2021 18:52   DG FEMUR, MIN 2 VIEWS RIGHT  Result Date: 08/05/2021 CLINICAL DATA:  Fall EXAM: RIGHT FEMUR 2 VIEWS COMPARISON:  None. FINDINGS: Vascular calcifications. Acute comminuted and impacted appearing proximal femoral fracture, appears to involve the basicervical region and trochanter. No femoral head dislocation. IMPRESSION: Acute comminuted and impacted proximal femoral fracture appears to involve the intertrochanteric region and basicervical region. Electronically Signed   By: Donavan Foil M.D.   On: 08/05/2021 19:40    DISCHARGE EXAMINATION: Vitals:   08/12/21 1618 08/13/21 0459 08/13/21 0808 08/13/21 0823  BP: (!) 127/52 (!) 136/49  (!) 128/55  Pulse: 69 76  71  Resp: 17   17  Temp: 97.9 F (36.6 C)   98.2 F (36.8 C)  TempSrc: Oral   Oral  SpO2: 100% 97% 97% 100%  Weight:      Height:        General appearance: Awake alert.  In no distress Resp: Clear to auscultation bilaterally.  Normal effort Cardio: S1-S2 is normal regular.  No S3-S4.  No rubs murmurs or bruit GI: Abdomen is soft.  Nontender nondistended.  Bowel sounds are present normal.  No masses organomegaly    DISPOSITION: SNF  Discharge Instructions     Call MD for:  difficulty breathing, headache or visual disturbances   Complete by: As directed    Call MD for:  extreme fatigue   Complete by: As directed    Call MD for:  persistant dizziness or light-headedness   Complete by: As directed    Call MD for:  persistant nausea and vomiting   Complete by: As directed    Call MD for:  severe uncontrolled pain    Complete by: As directed    Call MD for:  temperature >100.4   Complete by: As directed    Diet - low sodium heart healthy   Complete by: As directed    Discharge instructions   Complete by: As directed    Please review instructions on the discharge summary.  You were cared for by a hospitalist during your hospital stay. If you have any questions about your discharge medications or the care you received while you were in the hospital after you are discharged, you can call the unit and asked to speak with the hospitalist on call if the hospitalist that took care of you is not available. Once you are discharged, your primary care physician will handle any further medical issues. Please note that NO REFILLS for any discharge medications will be authorized once you are discharged, as it is imperative that you return to your primary care physician (or establish a relationship with a primary care physician if you do not have one) for your aftercare needs so that they can reassess your need for medications and monitor your lab values. If you do not have a primary care physician, you can call [phone] for  a physician referral.   Discharge wound care:   Complete by: As directed    Wound care  Daily      Comments: Wound care to right elbow skin tear, partial avulsion:  Cleanse with NS, pat dry. Cover entire area with folded piece of xeroform gauze Kellie Simmering # [id number]), top with ABD pad and secure with a few turns of Kerlix roll gauze/paper tape. Change daily.   Increase activity slowly   Complete by: As directed         Allergies as of 08/13/2021       Reactions   Adhesive [tape] Itching   Surgical Tape - "makes me itch and break me out" -- HAS TO USE PAPER TAPE   Amoxicillin Hives, Itching, Swelling   Latex Itching   Other Other (See Comments)   Pioglitazone Other (See Comments)        Medication List     STOP taking these medications    hydrochlorothiazide 25 MG tablet Commonly known as:  HYDRODIURIL   ramipril 10 MG capsule Commonly known as: ALTACE       TAKE these medications    albuterol 108 (90 Base) MCG/ACT inhaler Commonly known as: VENTOLIN HFA Inhale into the lungs every 6 (six) hours as needed for wheezing or shortness of breath.   amLODipine 10 MG tablet Commonly known as: NORVASC Take 1 tablet (10 mg total) by mouth daily.   apixaban 2.5 MG Tabs tablet Commonly known as: ELIQUIS Take 1 tablet (2.5 mg total) by mouth 2 (two) times daily. What changed:  medication strength how much to take   atorvastatin 80 MG tablet Commonly known as: LIPITOR Take 1 tablet (80 mg total) by mouth every morning.   budesonide-formoterol 160-4.5 MCG/ACT inhaler Commonly known as: SYMBICORT Inhale 2 puffs into the lungs 2 (two) times daily.   Calcium Carb-Cholecalciferol 600-800 MG-UNIT Tabs Take 1 tablet by mouth daily.   clobetasol 0.05 % external solution Commonly known as: TEMOVATE 1 application 2 (two) times daily as needed (rash).   docusate sodium 100 MG capsule Commonly known as: COLACE Take 2 capsules (200 mg total) by mouth 2 (two) times daily.   ferrous sulfate 325 (65 FE) MG tablet Take 325 mg by mouth 3 (three) times a week. Mon, Wed, Friday   insulin glargine 100 UNIT/ML Solostar Pen Commonly known as: LANTUS Inject 8 Units into the skin daily at 2 PM.   levothyroxine 88 MCG tablet Commonly known as: SYNTHROID Take 88 mcg by mouth daily before breakfast.   metoprolol succinate 25 MG 24 hr tablet Commonly known as: TOPROL-XL TAKE 1 TABLET BY MOUTH EVERY DAY What changed: how to take this   NovoLOG FlexPen 100 UNIT/ML FlexPen Generic drug: insulin aspart Inject 4-6 Units into the skin 3 (three) times daily. Inject 4 units in the morning, 5 units at lunch.   6 units in the evening  (Sliding scale)   ONE TOUCH ULTRA TEST test strip Generic drug: glucose blood TEST THREE TIMES DAILY E11.22   oxyCODONE-acetaminophen 5-325 MG  tablet Commonly known as: PERCOCET/ROXICET Take 1 tablet by mouth every 4 (four) hours as needed for moderate pain or severe pain.   polyethylene glycol 17 g packet Commonly known as: MIRALAX / GLYCOLAX Take 17 g by mouth daily.   sodium chloride 0.65 % Soln nasal spray Commonly known as: OCEAN Place 1 spray into both nostrils as needed for congestion.  Discharge Care Instructions  (From admission, onward)           Start     Ordered   08/11/21 0000  Discharge wound care:       Comments: Wound care  Daily      Comments: Wound care to right elbow skin tear, partial avulsion:  Cleanse with NS, pat dry. Cover entire area with folded piece of xeroform gauze Kellie Simmering # 294), top with ABD pad and secure with a few turns of Kerlix roll gauze/paper tape. Change daily.   08/11/21 2330              Follow-up Information     Haddix, Thomasene Lot, MD. Schedule an appointment as soon as possible for a visit in 2 week(s).   Specialty: Orthopedic Surgery Why: for wound check and repeat x-rays Contact information: [place] [address] [phone]         Seward Carol, MD. Schedule an appointment as soon as possible for a visit in 1 week(s).   Specialty: Internal Medicine Contact information: [address] [place] [address] [phone]                 TOTAL DISCHARGE TIME: 66 minutes  [facility] Pager on [email or web address]  08/13/2021, 9:24 AM

## 2021-08-13 NOTE — Progress Notes (Signed)
Order to discharge pt to [facility].  Attempted to call report.  AVS placed in pt chart for transport.  Will continue to monitor.

## 2021-08-13 NOTE — Progress Notes (Signed)
Occupational Therapy Treatment Patient Details Name: Ariel Cobb MRN: [id number] DOB: 1946/02/06 Today's Date: 08/13/2021   History of present illness Pt is a 76 y/o F presenting to ED on 2/9 after mechanical fall in driveway, found to have R hip fx. s/p IMN on 2/10. PMH includes arthritis, HLD, HTN, PAD   OT comments  Pt. Seen for skilled OT treatment session.  Pt. Able to complete bed mobility without physical assistance.  Multiple sit/stand transfers and short distance in room mobility to/from b.room min guard a. Cues for hand placement and safe management of RW.  Plan for A/E use next session for LB ADLS.     Recommendations for follow up therapy are one component of a multi-disciplinary discharge planning process, led by the attending physician.  Recommendations may be updated based on patient status, additional functional criteria and insurance authorization.    Follow Up Recommendations  Skilled nursing-short term rehab (<3 hours/day)    Assistance Recommended at Discharge Intermittent Supervision/Assistance  Patient can return home with the following  A little help with walking and/or transfers;A little help with bathing/dressing/bathroom;Assistance with cooking/housework   Equipment Recommendations  Other (comment);BSC/3in1    Recommendations for Other Services      Precautions / Restrictions Precautions Precautions: Fall Restrictions Weight Bearing Restrictions: Yes RLE Weight Bearing: Weight bearing as tolerated       Mobility Bed Mobility Overal bed mobility: Needs Assistance Bed Mobility: Supine to Sit     Supine to sit: Min guard     General bed mobility comments: cues to use b ues to guide R LE out of bed without physical assistance required    Transfers Overall transfer level: Needs assistance Equipment used: Rolling walker (2 wheels) Transfers: Sit to/from Stand, Bed to chair/wheelchair/BSC Sit to Stand: Min guard, Min assist Stand pivot  transfers: Min guard, Min assist   Step pivot transfers: Min guard     General transfer comment: light cues for hand placement     Balance                                           ADL either performed or assessed with clinical judgement   ADL Overall ADL's : Needs assistance/impaired                       Lower Body Dressing Details (indicate cue type and reason): able to simulate donning socks to LLE, does not have mobility to don RLE sock-reviewed benefits of using A/E will attempt use at next session Toilet Transfer: Min guard;Rolling walker (2 wheels);Ambulation;Comfort height toilet;BSC/3in1 Armed forces technical officer Details (indicate cue type and reason): 3n1 over the commode Toileting- Clothing Manipulation and Hygiene: Set up;Sitting/lateral lean Toileting - Clothing Manipulation Details (indicate cue type and reason): completes sitting/laterally leaning on commode     Functional mobility during ADLs: Minimal assistance;Rolling walker (2 wheels) General ADL Comments: educated on use of A/E for LB ADLS.  assisted with meal concerns and helped her coordinate with the menu and phone number    Extremity/Trunk Assessment              Vision       Perception     Praxis      Cognition Arousal/Alertness: Awake/alert Behavior During Therapy: [facility] for tasks assessed/performed Overall Cognitive Status: Within Functional Limits for tasks assessed  Exercises      Shoulder Instructions       General Comments      Pertinent Vitals/ Pain       Pain Assessment Pain Assessment: No/denies pain  Home Living                                          Prior Functioning/Environment              Frequency  Min 2X/week        Progress Toward Goals  OT Goals(current goals can now be found in the care plan section)  Progress towards OT goals: Progressing toward  goals     Plan Discharge plan remains appropriate;Frequency remains appropriate    Co-evaluation                 AM-PAC OT "6 Clicks" Daily Activity     Outcome Measure   Help from another person eating meals?: None Help from another person taking care of personal grooming?: A Little Help from another person toileting, which includes using toliet, bedpan, or urinal?: A Lot Help from another person bathing (including washing, rinsing, drying)?: A Lot Help from another person to put on and taking off regular upper body clothing?: A Little Help from another person to put on and taking off regular lower body clothing?: A Lot 6 Click Score: 16    End of Session Equipment Utilized During Treatment: Gait belt;Rolling walker (2 wheels)  OT Visit Diagnosis: Unsteadiness on feet (R26.81);Other abnormalities of gait and mobility (R26.89);Muscle weakness (generalized) (M62.81)   Activity Tolerance Patient tolerated treatment well   Patient Left in chair;with call bell/phone within reach   Nurse Communication Other (comment) (reviewed with rn pt. concerns with taking stool softeners when not needed.  reviewed rec. for bsc and/or b.room use vs. bed pan. also pt. requesting taken off automatic meals and wants to order her own for each meal)        Time: 6553-7482 OT Time Calculation (min): 25 min  Charges: OT General Charges $OT Visit: 1 Visit OT Treatments $Self Care/Home Management : 23-37 mins  Sonia Baller, COTA/L Acute Rehabilitation [phone]   Tanya Nones 08/13/2021, 11:00 AM

## 2021-08-13 NOTE — Progress Notes (Signed)
Physical Therapy Treatment Patient Details Name: Ariel Cobb MRN: [id number] DOB: [birth year] Today's Date: 08/13/2021   History of Present Illness Pt is a 76 y/o F presenting to ED on 2/9 after mechanical fall in driveway, found to have R hip fx. s/p IMN on 2/10. PMH includes arthritis, HLD, HTN, PAD    PT Comments    Pt OOB in recliner upon arrival and agreeable to session with fair tolerance with gross min guard throughout. Pt agreeable to room level gait training with standing TE following, without a seated rest break. Extended standing rest breaks needed between standing exercises, but pt with good tolerance and motivation to continue. Pt continues to benefit from skilled PT services to progress toward functional mobility goals.    Recommendations for follow up therapy are one component of a multi-disciplinary discharge planning process, led by the attending physician.  Recommendations may be updated based on patient status, additional functional criteria and insurance authorization.  Follow Up Recommendations  Skilled nursing-short term rehab (<3 hours/day)     Assistance Recommended at Discharge Frequent or constant Supervision/Assistance  Patient can return home with the following Two people to help with walking and/or transfers;Assist for transportation;Help with stairs or ramp for entrance   Equipment Recommendations  Rolling walker (2 wheels)    Recommendations for Other Services       Precautions / Restrictions Precautions Precautions: Fall Restrictions Weight Bearing Restrictions: Yes RLE Weight Bearing: Weight bearing as tolerated     Mobility  Bed Mobility       General bed mobility comments: pt OOB in recliner upon arrival    Transfers Overall transfer level: Needs assistance Equipment used: Rolling walker (2 wheels) Transfers: Sit to/from Stand Sit to Stand: Min guard     General transfer comment: light cues for hand placement     Ambulation/Gait Ambulation/Gait assistance: Min guard Gait Distance (Feet): 25 Feet Assistive device: Rolling walker (2 wheels) Gait Pattern/deviations: Step-to pattern, Decreased step length - right, Decreased weight shift to right, Antalgic, Trunk flexed Gait velocity: Decreased     General Gait Details: slow, steady gait with distance being limited by fatigue after 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    Modified Rankin (Stroke Patients Only)       Balance Overall balance assessment: Needs assistance Sitting-balance support: Feet supported Sitting balance-Leahy Scale: Good Sitting balance - Comments: sits unsupported at edge of recliner without LOB   Standing balance support: Reliant on assistive device for balance, During functional activity Standing balance-Leahy Scale: Poor            Cognition Arousal/Alertness: Awake/alert Behavior During Therapy: WFL for tasks assessed/performed Overall Cognitive Status: Within Functional Limits for tasks assessed               General Comments: decreased awareness of deficits        Exercises General Exercises - Lower Extremity Ankle Circles/Pumps: AROM, Both, 20 reps, Seated Hip ABduction/ADduction: AROM, Right, 10 reps, Standing Hip Flexion/Marching: AROM, Right, 10 reps, Standing    General Comments        Pertinent Vitals/Pain Pain Assessment Pain Assessment: Faces Faces Pain Scale: Hurts a little bit Pain Location: R hip when standing Pain Descriptors / Indicators: Discomfort, Aching Pain Intervention(s): Monitored during session, Limited activity within patient's tolerance, Repositioned    Home Living  Prior Function            PT Goals (current goals can now be found in the care plan section) Acute Rehab PT Goals PT Goal Formulation: With patient Time For Goal Achievement: 08/21/21 Potential to Achieve Goals:  Good    Frequency    Min 3X/week      PT Plan Current plan remains appropriate    Co-evaluation              AM-PAC PT "6 Clicks" Mobility   Outcome Measure  Help needed turning from your back to your side while in a flat bed without using bedrails?: A Little Help needed moving from lying on your back to sitting on the side of a flat bed without using bedrails?: A Little Help needed moving to and from a bed to a chair (including a wheelchair)?: A Little Help needed standing up from a chair using your arms (e.g., wheelchair or bedside chair)?: A Little Help needed to walk in hospital room?: A Little Help needed climbing 3-5 steps with a railing? : Total 6 Click Score: 16    End of Session Equipment Utilized During Treatment: Gait belt Activity Tolerance: Patient tolerated treatment well;Patient limited by fatigue Patient left: in chair;with call bell/phone within reach Nurse Communication: Mobility status PT Visit Diagnosis: Unsteadiness on feet (R26.81);Pain Pain - Right/Left: Right Pain - part of body: Hip     Time: 2072-1828 PT Time Calculation (min) (ACUTE ONLY): 26 min  Charges:  $Gait Training: 8-22 mins $Therapeutic Exercise: 8-22 mins                Kylon Philbrook R. PTA Acute Rehabilitation Services Office: [place] 08/13/2021, 10:24 AM

## 2021-08-18 DIAGNOSIS — E1165 Type 2 diabetes mellitus with hyperglycemia: Secondary | ICD-10-CM | POA: Diagnosis not present

## 2021-08-18 DIAGNOSIS — D75839 Thrombocytosis, unspecified: Secondary | ICD-10-CM | POA: Diagnosis not present

## 2021-08-18 DIAGNOSIS — S41111A Laceration without foreign body of right upper arm, initial encounter: Secondary | ICD-10-CM | POA: Diagnosis not present

## 2021-08-18 DIAGNOSIS — D649 Anemia, unspecified: Secondary | ICD-10-CM | POA: Diagnosis not present

## 2021-08-23 DIAGNOSIS — E1165 Type 2 diabetes mellitus with hyperglycemia: Secondary | ICD-10-CM | POA: Diagnosis not present

## 2021-08-23 DIAGNOSIS — L853 Xerosis cutis: Secondary | ICD-10-CM | POA: Diagnosis not present

## 2021-08-23 DIAGNOSIS — R2243 Localized swelling, mass and lump, lower limb, bilateral: Secondary | ICD-10-CM | POA: Diagnosis not present

## 2021-08-25 DIAGNOSIS — E1165 Type 2 diabetes mellitus with hyperglycemia: Secondary | ICD-10-CM | POA: Diagnosis not present

## 2021-08-25 DIAGNOSIS — R5383 Other fatigue: Secondary | ICD-10-CM | POA: Diagnosis not present

## 2021-08-26 DIAGNOSIS — M6281 Muscle weakness (generalized): Secondary | ICD-10-CM | POA: Diagnosis not present

## 2021-08-26 DIAGNOSIS — E0821 Diabetes mellitus due to underlying condition with diabetic nephropathy: Secondary | ICD-10-CM | POA: Diagnosis not present

## 2021-08-26 DIAGNOSIS — Z7901 Long term (current) use of anticoagulants: Secondary | ICD-10-CM | POA: Diagnosis not present

## 2021-08-26 DIAGNOSIS — R2689 Other abnormalities of gait and mobility: Secondary | ICD-10-CM | POA: Diagnosis not present

## 2021-08-26 DIAGNOSIS — J449 Chronic obstructive pulmonary disease, unspecified: Secondary | ICD-10-CM | POA: Diagnosis not present

## 2021-08-26 DIAGNOSIS — Z9181 History of falling: Secondary | ICD-10-CM | POA: Diagnosis not present

## 2021-08-26 DIAGNOSIS — E1165 Type 2 diabetes mellitus with hyperglycemia: Secondary | ICD-10-CM | POA: Diagnosis not present

## 2021-08-26 DIAGNOSIS — N390 Urinary tract infection, site not specified: Secondary | ICD-10-CM | POA: Diagnosis not present

## 2021-08-31 DIAGNOSIS — I4891 Unspecified atrial fibrillation: Secondary | ICD-10-CM | POA: Diagnosis not present

## 2021-08-31 DIAGNOSIS — Z7901 Long term (current) use of anticoagulants: Secondary | ICD-10-CM | POA: Diagnosis not present

## 2021-08-31 DIAGNOSIS — M1990 Unspecified osteoarthritis, unspecified site: Secondary | ICD-10-CM | POA: Diagnosis not present

## 2021-08-31 DIAGNOSIS — E1151 Type 2 diabetes mellitus with diabetic peripheral angiopathy without gangrene: Secondary | ICD-10-CM | POA: Diagnosis not present

## 2021-08-31 DIAGNOSIS — J449 Chronic obstructive pulmonary disease, unspecified: Secondary | ICD-10-CM | POA: Diagnosis not present

## 2021-08-31 DIAGNOSIS — Z9181 History of falling: Secondary | ICD-10-CM | POA: Diagnosis not present

## 2021-08-31 DIAGNOSIS — I10 Essential (primary) hypertension: Secondary | ICD-10-CM | POA: Diagnosis not present

## 2021-08-31 DIAGNOSIS — Z794 Long term (current) use of insulin: Secondary | ICD-10-CM | POA: Diagnosis not present

## 2021-08-31 DIAGNOSIS — R69 Illness, unspecified: Secondary | ICD-10-CM | POA: Diagnosis not present

## 2021-08-31 DIAGNOSIS — E039 Hypothyroidism, unspecified: Secondary | ICD-10-CM | POA: Diagnosis not present

## 2021-08-31 DIAGNOSIS — S72001D Fracture of unspecified part of neck of right femur, subsequent encounter for closed fracture with routine healing: Secondary | ICD-10-CM | POA: Diagnosis not present

## 2021-09-01 ENCOUNTER — Encounter (HOSPITAL_COMMUNITY): Payer: Self-pay | Admitting: Radiology

## 2021-09-01 DIAGNOSIS — E1151 Type 2 diabetes mellitus with diabetic peripheral angiopathy without gangrene: Secondary | ICD-10-CM | POA: Diagnosis not present

## 2021-09-01 DIAGNOSIS — I10 Essential (primary) hypertension: Secondary | ICD-10-CM | POA: Diagnosis not present

## 2021-09-01 DIAGNOSIS — E039 Hypothyroidism, unspecified: Secondary | ICD-10-CM | POA: Diagnosis not present

## 2021-09-01 DIAGNOSIS — Z7901 Long term (current) use of anticoagulants: Secondary | ICD-10-CM | POA: Diagnosis not present

## 2021-09-01 DIAGNOSIS — M1990 Unspecified osteoarthritis, unspecified site: Secondary | ICD-10-CM | POA: Diagnosis not present

## 2021-09-01 DIAGNOSIS — Z9181 History of falling: Secondary | ICD-10-CM | POA: Diagnosis not present

## 2021-09-01 DIAGNOSIS — R69 Illness, unspecified: Secondary | ICD-10-CM | POA: Diagnosis not present

## 2021-09-01 DIAGNOSIS — J449 Chronic obstructive pulmonary disease, unspecified: Secondary | ICD-10-CM | POA: Diagnosis not present

## 2021-09-01 DIAGNOSIS — Z794 Long term (current) use of insulin: Secondary | ICD-10-CM | POA: Diagnosis not present

## 2021-09-01 DIAGNOSIS — I4891 Unspecified atrial fibrillation: Secondary | ICD-10-CM | POA: Diagnosis not present

## 2021-09-01 DIAGNOSIS — S72001D Fracture of unspecified part of neck of right femur, subsequent encounter for closed fracture with routine healing: Secondary | ICD-10-CM | POA: Diagnosis not present

## 2021-09-03 DIAGNOSIS — R69 Illness, unspecified: Secondary | ICD-10-CM | POA: Diagnosis not present

## 2021-09-03 DIAGNOSIS — I10 Essential (primary) hypertension: Secondary | ICD-10-CM | POA: Diagnosis not present

## 2021-09-03 DIAGNOSIS — I4891 Unspecified atrial fibrillation: Secondary | ICD-10-CM | POA: Diagnosis not present

## 2021-09-03 DIAGNOSIS — E1151 Type 2 diabetes mellitus with diabetic peripheral angiopathy without gangrene: Secondary | ICD-10-CM | POA: Diagnosis not present

## 2021-09-03 DIAGNOSIS — Z9181 History of falling: Secondary | ICD-10-CM | POA: Diagnosis not present

## 2021-09-03 DIAGNOSIS — Z7901 Long term (current) use of anticoagulants: Secondary | ICD-10-CM | POA: Diagnosis not present

## 2021-09-03 DIAGNOSIS — J449 Chronic obstructive pulmonary disease, unspecified: Secondary | ICD-10-CM | POA: Diagnosis not present

## 2021-09-03 DIAGNOSIS — Z794 Long term (current) use of insulin: Secondary | ICD-10-CM | POA: Diagnosis not present

## 2021-09-03 DIAGNOSIS — E039 Hypothyroidism, unspecified: Secondary | ICD-10-CM | POA: Diagnosis not present

## 2021-09-03 DIAGNOSIS — M1990 Unspecified osteoarthritis, unspecified site: Secondary | ICD-10-CM | POA: Diagnosis not present

## 2021-09-03 DIAGNOSIS — S72001D Fracture of unspecified part of neck of right femur, subsequent encounter for closed fracture with routine healing: Secondary | ICD-10-CM | POA: Diagnosis not present

## 2021-09-06 DIAGNOSIS — I10 Essential (primary) hypertension: Secondary | ICD-10-CM | POA: Diagnosis not present

## 2021-09-06 DIAGNOSIS — Z7901 Long term (current) use of anticoagulants: Secondary | ICD-10-CM | POA: Diagnosis not present

## 2021-09-06 DIAGNOSIS — Z9181 History of falling: Secondary | ICD-10-CM | POA: Diagnosis not present

## 2021-09-06 DIAGNOSIS — I4891 Unspecified atrial fibrillation: Secondary | ICD-10-CM | POA: Diagnosis not present

## 2021-09-06 DIAGNOSIS — E039 Hypothyroidism, unspecified: Secondary | ICD-10-CM | POA: Diagnosis not present

## 2021-09-06 DIAGNOSIS — J449 Chronic obstructive pulmonary disease, unspecified: Secondary | ICD-10-CM | POA: Diagnosis not present

## 2021-09-06 DIAGNOSIS — S72001D Fracture of unspecified part of neck of right femur, subsequent encounter for closed fracture with routine healing: Secondary | ICD-10-CM | POA: Diagnosis not present

## 2021-09-06 DIAGNOSIS — Z794 Long term (current) use of insulin: Secondary | ICD-10-CM | POA: Diagnosis not present

## 2021-09-06 DIAGNOSIS — R69 Illness, unspecified: Secondary | ICD-10-CM | POA: Diagnosis not present

## 2021-09-06 DIAGNOSIS — E1151 Type 2 diabetes mellitus with diabetic peripheral angiopathy without gangrene: Secondary | ICD-10-CM | POA: Diagnosis not present

## 2021-09-06 DIAGNOSIS — M1990 Unspecified osteoarthritis, unspecified site: Secondary | ICD-10-CM | POA: Diagnosis not present

## 2021-09-07 ENCOUNTER — Other Ambulatory Visit: Payer: Self-pay

## 2021-09-07 ENCOUNTER — Other Ambulatory Visit: Payer: Self-pay | Admitting: Internal Medicine

## 2021-09-07 ENCOUNTER — Ambulatory Visit (HOSPITAL_COMMUNITY)
Admission: RE | Admit: 2021-09-07 | Discharge: 2021-09-07 | Disposition: A | Discharge status: Home / Self Care | Payer: Medicare HMO | Source: Ambulatory Visit | Attending: Internal Medicine | Admitting: Internal Medicine

## 2021-09-07 DIAGNOSIS — M7989 Other specified soft tissue disorders: Secondary | ICD-10-CM

## 2021-09-07 DIAGNOSIS — S72001A Fracture of unspecified part of neck of right femur, initial encounter for closed fracture: Secondary | ICD-10-CM | POA: Diagnosis not present

## 2021-09-07 DIAGNOSIS — E039 Hypothyroidism, unspecified: Secondary | ICD-10-CM | POA: Diagnosis not present

## 2021-09-07 DIAGNOSIS — I10 Essential (primary) hypertension: Secondary | ICD-10-CM | POA: Diagnosis not present

## 2021-09-07 DIAGNOSIS — M810 Age-related osteoporosis without current pathological fracture: Secondary | ICD-10-CM | POA: Diagnosis not present

## 2021-09-07 DIAGNOSIS — N1832 Chronic kidney disease, stage 3b: Secondary | ICD-10-CM | POA: Diagnosis not present

## 2021-09-07 DIAGNOSIS — E113299 Type 2 diabetes mellitus with mild nonproliferative diabetic retinopathy without macular edema, unspecified eye: Secondary | ICD-10-CM | POA: Diagnosis not present

## 2021-09-07 DIAGNOSIS — E11319 Type 2 diabetes mellitus with unspecified diabetic retinopathy without macular edema: Secondary | ICD-10-CM | POA: Diagnosis not present

## 2021-09-07 DIAGNOSIS — D500 Iron deficiency anemia secondary to blood loss (chronic): Secondary | ICD-10-CM | POA: Diagnosis not present

## 2021-09-07 DIAGNOSIS — I480 Paroxysmal atrial fibrillation: Secondary | ICD-10-CM | POA: Diagnosis not present

## 2021-09-07 DIAGNOSIS — E1122 Type 2 diabetes mellitus with diabetic chronic kidney disease: Secondary | ICD-10-CM | POA: Diagnosis not present

## 2021-09-07 NOTE — Progress Notes (Signed)
Right lower extremity venous duplex completed. ?Refer to "CV Proc" under chart review to view preliminary results. ? ?09/07/2021 4:37 PM ?Kelby Aline., MHA, RVT, RDCS, RDMS   ?

## 2021-09-08 ENCOUNTER — Other Ambulatory Visit: Payer: Medicare HMO

## 2021-09-09 DIAGNOSIS — R69 Illness, unspecified: Secondary | ICD-10-CM | POA: Diagnosis not present

## 2021-09-09 DIAGNOSIS — Z794 Long term (current) use of insulin: Secondary | ICD-10-CM | POA: Diagnosis not present

## 2021-09-09 DIAGNOSIS — J449 Chronic obstructive pulmonary disease, unspecified: Secondary | ICD-10-CM | POA: Diagnosis not present

## 2021-09-09 DIAGNOSIS — S72001D Fracture of unspecified part of neck of right femur, subsequent encounter for closed fracture with routine healing: Secondary | ICD-10-CM | POA: Diagnosis not present

## 2021-09-09 DIAGNOSIS — M1990 Unspecified osteoarthritis, unspecified site: Secondary | ICD-10-CM | POA: Diagnosis not present

## 2021-09-09 DIAGNOSIS — Z9181 History of falling: Secondary | ICD-10-CM | POA: Diagnosis not present

## 2021-09-09 DIAGNOSIS — I10 Essential (primary) hypertension: Secondary | ICD-10-CM | POA: Diagnosis not present

## 2021-09-09 DIAGNOSIS — E039 Hypothyroidism, unspecified: Secondary | ICD-10-CM | POA: Diagnosis not present

## 2021-09-09 DIAGNOSIS — E1151 Type 2 diabetes mellitus with diabetic peripheral angiopathy without gangrene: Secondary | ICD-10-CM | POA: Diagnosis not present

## 2021-09-09 DIAGNOSIS — I4891 Unspecified atrial fibrillation: Secondary | ICD-10-CM | POA: Diagnosis not present

## 2021-09-09 DIAGNOSIS — Z7901 Long term (current) use of anticoagulants: Secondary | ICD-10-CM | POA: Diagnosis not present

## 2021-09-10 DIAGNOSIS — Z7901 Long term (current) use of anticoagulants: Secondary | ICD-10-CM | POA: Diagnosis not present

## 2021-09-10 DIAGNOSIS — I4891 Unspecified atrial fibrillation: Secondary | ICD-10-CM | POA: Diagnosis not present

## 2021-09-10 DIAGNOSIS — J449 Chronic obstructive pulmonary disease, unspecified: Secondary | ICD-10-CM | POA: Diagnosis not present

## 2021-09-10 DIAGNOSIS — S72001D Fracture of unspecified part of neck of right femur, subsequent encounter for closed fracture with routine healing: Secondary | ICD-10-CM | POA: Diagnosis not present

## 2021-09-10 DIAGNOSIS — E039 Hypothyroidism, unspecified: Secondary | ICD-10-CM | POA: Diagnosis not present

## 2021-09-10 DIAGNOSIS — M1990 Unspecified osteoarthritis, unspecified site: Secondary | ICD-10-CM | POA: Diagnosis not present

## 2021-09-10 DIAGNOSIS — E1151 Type 2 diabetes mellitus with diabetic peripheral angiopathy without gangrene: Secondary | ICD-10-CM | POA: Diagnosis not present

## 2021-09-10 DIAGNOSIS — Z794 Long term (current) use of insulin: Secondary | ICD-10-CM | POA: Diagnosis not present

## 2021-09-10 DIAGNOSIS — Z9181 History of falling: Secondary | ICD-10-CM | POA: Diagnosis not present

## 2021-09-10 DIAGNOSIS — I10 Essential (primary) hypertension: Secondary | ICD-10-CM | POA: Diagnosis not present

## 2021-09-10 DIAGNOSIS — R69 Illness, unspecified: Secondary | ICD-10-CM | POA: Diagnosis not present

## 2021-09-11 DIAGNOSIS — E1151 Type 2 diabetes mellitus with diabetic peripheral angiopathy without gangrene: Secondary | ICD-10-CM | POA: Diagnosis not present

## 2021-09-11 DIAGNOSIS — Z794 Long term (current) use of insulin: Secondary | ICD-10-CM | POA: Diagnosis not present

## 2021-09-11 DIAGNOSIS — I10 Essential (primary) hypertension: Secondary | ICD-10-CM | POA: Diagnosis not present

## 2021-09-11 DIAGNOSIS — Z7901 Long term (current) use of anticoagulants: Secondary | ICD-10-CM | POA: Diagnosis not present

## 2021-09-11 DIAGNOSIS — R69 Illness, unspecified: Secondary | ICD-10-CM | POA: Diagnosis not present

## 2021-09-11 DIAGNOSIS — E039 Hypothyroidism, unspecified: Secondary | ICD-10-CM | POA: Diagnosis not present

## 2021-09-11 DIAGNOSIS — S72001D Fracture of unspecified part of neck of right femur, subsequent encounter for closed fracture with routine healing: Secondary | ICD-10-CM | POA: Diagnosis not present

## 2021-09-11 DIAGNOSIS — J449 Chronic obstructive pulmonary disease, unspecified: Secondary | ICD-10-CM | POA: Diagnosis not present

## 2021-09-11 DIAGNOSIS — M1990 Unspecified osteoarthritis, unspecified site: Secondary | ICD-10-CM | POA: Diagnosis not present

## 2021-09-11 DIAGNOSIS — I4891 Unspecified atrial fibrillation: Secondary | ICD-10-CM | POA: Diagnosis not present

## 2021-09-11 DIAGNOSIS — Z9181 History of falling: Secondary | ICD-10-CM | POA: Diagnosis not present

## 2021-09-13 DIAGNOSIS — S72141D Displaced intertrochanteric fracture of right femur, subsequent encounter for closed fracture with routine healing: Secondary | ICD-10-CM | POA: Diagnosis not present

## 2021-09-14 DIAGNOSIS — E1151 Type 2 diabetes mellitus with diabetic peripheral angiopathy without gangrene: Secondary | ICD-10-CM | POA: Diagnosis not present

## 2021-09-14 DIAGNOSIS — S72001D Fracture of unspecified part of neck of right femur, subsequent encounter for closed fracture with routine healing: Secondary | ICD-10-CM | POA: Diagnosis not present

## 2021-09-14 DIAGNOSIS — I10 Essential (primary) hypertension: Secondary | ICD-10-CM | POA: Diagnosis not present

## 2021-09-14 DIAGNOSIS — J449 Chronic obstructive pulmonary disease, unspecified: Secondary | ICD-10-CM | POA: Diagnosis not present

## 2021-09-14 DIAGNOSIS — R69 Illness, unspecified: Secondary | ICD-10-CM | POA: Diagnosis not present

## 2021-09-14 DIAGNOSIS — E039 Hypothyroidism, unspecified: Secondary | ICD-10-CM | POA: Diagnosis not present

## 2021-09-14 DIAGNOSIS — I4891 Unspecified atrial fibrillation: Secondary | ICD-10-CM | POA: Diagnosis not present

## 2021-09-14 DIAGNOSIS — Z794 Long term (current) use of insulin: Secondary | ICD-10-CM | POA: Diagnosis not present

## 2021-09-14 DIAGNOSIS — Z7901 Long term (current) use of anticoagulants: Secondary | ICD-10-CM | POA: Diagnosis not present

## 2021-09-14 DIAGNOSIS — M1990 Unspecified osteoarthritis, unspecified site: Secondary | ICD-10-CM | POA: Diagnosis not present

## 2021-09-14 DIAGNOSIS — Z9181 History of falling: Secondary | ICD-10-CM | POA: Diagnosis not present

## 2021-09-15 DIAGNOSIS — I10 Essential (primary) hypertension: Secondary | ICD-10-CM | POA: Diagnosis not present

## 2021-09-15 DIAGNOSIS — Z7901 Long term (current) use of anticoagulants: Secondary | ICD-10-CM | POA: Diagnosis not present

## 2021-09-15 DIAGNOSIS — Z794 Long term (current) use of insulin: Secondary | ICD-10-CM | POA: Diagnosis not present

## 2021-09-15 DIAGNOSIS — R69 Illness, unspecified: Secondary | ICD-10-CM | POA: Diagnosis not present

## 2021-09-15 DIAGNOSIS — M1990 Unspecified osteoarthritis, unspecified site: Secondary | ICD-10-CM | POA: Diagnosis not present

## 2021-09-15 DIAGNOSIS — E039 Hypothyroidism, unspecified: Secondary | ICD-10-CM | POA: Diagnosis not present

## 2021-09-15 DIAGNOSIS — Z9181 History of falling: Secondary | ICD-10-CM | POA: Diagnosis not present

## 2021-09-15 DIAGNOSIS — E1151 Type 2 diabetes mellitus with diabetic peripheral angiopathy without gangrene: Secondary | ICD-10-CM | POA: Diagnosis not present

## 2021-09-15 DIAGNOSIS — J449 Chronic obstructive pulmonary disease, unspecified: Secondary | ICD-10-CM | POA: Diagnosis not present

## 2021-09-15 DIAGNOSIS — I4891 Unspecified atrial fibrillation: Secondary | ICD-10-CM | POA: Diagnosis not present

## 2021-09-15 DIAGNOSIS — S72001D Fracture of unspecified part of neck of right femur, subsequent encounter for closed fracture with routine healing: Secondary | ICD-10-CM | POA: Diagnosis not present

## 2021-09-16 DIAGNOSIS — M1990 Unspecified osteoarthritis, unspecified site: Secondary | ICD-10-CM | POA: Diagnosis not present

## 2021-09-16 DIAGNOSIS — Z9181 History of falling: Secondary | ICD-10-CM | POA: Diagnosis not present

## 2021-09-16 DIAGNOSIS — E039 Hypothyroidism, unspecified: Secondary | ICD-10-CM | POA: Diagnosis not present

## 2021-09-16 DIAGNOSIS — Z7901 Long term (current) use of anticoagulants: Secondary | ICD-10-CM | POA: Diagnosis not present

## 2021-09-16 DIAGNOSIS — J449 Chronic obstructive pulmonary disease, unspecified: Secondary | ICD-10-CM | POA: Diagnosis not present

## 2021-09-16 DIAGNOSIS — I10 Essential (primary) hypertension: Secondary | ICD-10-CM | POA: Diagnosis not present

## 2021-09-16 DIAGNOSIS — I4891 Unspecified atrial fibrillation: Secondary | ICD-10-CM | POA: Diagnosis not present

## 2021-09-16 DIAGNOSIS — R69 Illness, unspecified: Secondary | ICD-10-CM | POA: Diagnosis not present

## 2021-09-16 DIAGNOSIS — Z794 Long term (current) use of insulin: Secondary | ICD-10-CM | POA: Diagnosis not present

## 2021-09-16 DIAGNOSIS — S72001D Fracture of unspecified part of neck of right femur, subsequent encounter for closed fracture with routine healing: Secondary | ICD-10-CM | POA: Diagnosis not present

## 2021-09-16 DIAGNOSIS — E1151 Type 2 diabetes mellitus with diabetic peripheral angiopathy without gangrene: Secondary | ICD-10-CM | POA: Diagnosis not present

## 2021-09-20 DIAGNOSIS — S72001D Fracture of unspecified part of neck of right femur, subsequent encounter for closed fracture with routine healing: Secondary | ICD-10-CM | POA: Diagnosis not present

## 2021-09-20 DIAGNOSIS — I4891 Unspecified atrial fibrillation: Secondary | ICD-10-CM | POA: Diagnosis not present

## 2021-09-20 DIAGNOSIS — Z7901 Long term (current) use of anticoagulants: Secondary | ICD-10-CM | POA: Diagnosis not present

## 2021-09-20 DIAGNOSIS — E1151 Type 2 diabetes mellitus with diabetic peripheral angiopathy without gangrene: Secondary | ICD-10-CM | POA: Diagnosis not present

## 2021-09-20 DIAGNOSIS — J449 Chronic obstructive pulmonary disease, unspecified: Secondary | ICD-10-CM | POA: Diagnosis not present

## 2021-09-20 DIAGNOSIS — Z794 Long term (current) use of insulin: Secondary | ICD-10-CM | POA: Diagnosis not present

## 2021-09-20 DIAGNOSIS — M1990 Unspecified osteoarthritis, unspecified site: Secondary | ICD-10-CM | POA: Diagnosis not present

## 2021-09-20 DIAGNOSIS — I10 Essential (primary) hypertension: Secondary | ICD-10-CM | POA: Diagnosis not present

## 2021-09-20 DIAGNOSIS — E039 Hypothyroidism, unspecified: Secondary | ICD-10-CM | POA: Diagnosis not present

## 2021-09-20 DIAGNOSIS — R69 Illness, unspecified: Secondary | ICD-10-CM | POA: Diagnosis not present

## 2021-09-20 DIAGNOSIS — Z9181 History of falling: Secondary | ICD-10-CM | POA: Diagnosis not present

## 2021-09-22 DIAGNOSIS — E1151 Type 2 diabetes mellitus with diabetic peripheral angiopathy without gangrene: Secondary | ICD-10-CM | POA: Diagnosis not present

## 2021-09-22 DIAGNOSIS — R69 Illness, unspecified: Secondary | ICD-10-CM | POA: Diagnosis not present

## 2021-09-22 DIAGNOSIS — S72001D Fracture of unspecified part of neck of right femur, subsequent encounter for closed fracture with routine healing: Secondary | ICD-10-CM | POA: Diagnosis not present

## 2021-09-22 DIAGNOSIS — I10 Essential (primary) hypertension: Secondary | ICD-10-CM | POA: Diagnosis not present

## 2021-09-22 DIAGNOSIS — J449 Chronic obstructive pulmonary disease, unspecified: Secondary | ICD-10-CM | POA: Diagnosis not present

## 2021-09-22 DIAGNOSIS — Z794 Long term (current) use of insulin: Secondary | ICD-10-CM | POA: Diagnosis not present

## 2021-09-22 DIAGNOSIS — E039 Hypothyroidism, unspecified: Secondary | ICD-10-CM | POA: Diagnosis not present

## 2021-09-22 DIAGNOSIS — M1990 Unspecified osteoarthritis, unspecified site: Secondary | ICD-10-CM | POA: Diagnosis not present

## 2021-09-22 DIAGNOSIS — Z9181 History of falling: Secondary | ICD-10-CM | POA: Diagnosis not present

## 2021-09-22 DIAGNOSIS — Z7901 Long term (current) use of anticoagulants: Secondary | ICD-10-CM | POA: Diagnosis not present

## 2021-09-22 DIAGNOSIS — I4891 Unspecified atrial fibrillation: Secondary | ICD-10-CM | POA: Diagnosis not present

## 2021-09-23 DIAGNOSIS — I10 Essential (primary) hypertension: Secondary | ICD-10-CM | POA: Diagnosis not present

## 2021-09-23 DIAGNOSIS — Z794 Long term (current) use of insulin: Secondary | ICD-10-CM | POA: Diagnosis not present

## 2021-09-23 DIAGNOSIS — S72001D Fracture of unspecified part of neck of right femur, subsequent encounter for closed fracture with routine healing: Secondary | ICD-10-CM | POA: Diagnosis not present

## 2021-09-23 DIAGNOSIS — Z9181 History of falling: Secondary | ICD-10-CM | POA: Diagnosis not present

## 2021-09-23 DIAGNOSIS — I4891 Unspecified atrial fibrillation: Secondary | ICD-10-CM | POA: Diagnosis not present

## 2021-09-23 DIAGNOSIS — E1151 Type 2 diabetes mellitus with diabetic peripheral angiopathy without gangrene: Secondary | ICD-10-CM | POA: Diagnosis not present

## 2021-09-23 DIAGNOSIS — R69 Illness, unspecified: Secondary | ICD-10-CM | POA: Diagnosis not present

## 2021-09-23 DIAGNOSIS — Z7901 Long term (current) use of anticoagulants: Secondary | ICD-10-CM | POA: Diagnosis not present

## 2021-09-23 DIAGNOSIS — M1990 Unspecified osteoarthritis, unspecified site: Secondary | ICD-10-CM | POA: Diagnosis not present

## 2021-09-23 DIAGNOSIS — J449 Chronic obstructive pulmonary disease, unspecified: Secondary | ICD-10-CM | POA: Diagnosis not present

## 2021-09-23 DIAGNOSIS — E039 Hypothyroidism, unspecified: Secondary | ICD-10-CM | POA: Diagnosis not present

## 2021-09-24 DIAGNOSIS — I4891 Unspecified atrial fibrillation: Secondary | ICD-10-CM | POA: Diagnosis not present

## 2021-09-24 DIAGNOSIS — S72001D Fracture of unspecified part of neck of right femur, subsequent encounter for closed fracture with routine healing: Secondary | ICD-10-CM | POA: Diagnosis not present

## 2021-09-24 DIAGNOSIS — E039 Hypothyroidism, unspecified: Secondary | ICD-10-CM | POA: Diagnosis not present

## 2021-09-24 DIAGNOSIS — M1990 Unspecified osteoarthritis, unspecified site: Secondary | ICD-10-CM | POA: Diagnosis not present

## 2021-09-24 DIAGNOSIS — Z7901 Long term (current) use of anticoagulants: Secondary | ICD-10-CM | POA: Diagnosis not present

## 2021-09-24 DIAGNOSIS — R69 Illness, unspecified: Secondary | ICD-10-CM | POA: Diagnosis not present

## 2021-09-24 DIAGNOSIS — Z794 Long term (current) use of insulin: Secondary | ICD-10-CM | POA: Diagnosis not present

## 2021-09-24 DIAGNOSIS — E1151 Type 2 diabetes mellitus with diabetic peripheral angiopathy without gangrene: Secondary | ICD-10-CM | POA: Diagnosis not present

## 2021-09-24 DIAGNOSIS — Z9181 History of falling: Secondary | ICD-10-CM | POA: Diagnosis not present

## 2021-09-24 DIAGNOSIS — J449 Chronic obstructive pulmonary disease, unspecified: Secondary | ICD-10-CM | POA: Diagnosis not present

## 2021-09-24 DIAGNOSIS — I10 Essential (primary) hypertension: Secondary | ICD-10-CM | POA: Diagnosis not present

## 2021-09-27 DIAGNOSIS — M1990 Unspecified osteoarthritis, unspecified site: Secondary | ICD-10-CM | POA: Diagnosis not present

## 2021-09-27 DIAGNOSIS — Z9181 History of falling: Secondary | ICD-10-CM | POA: Diagnosis not present

## 2021-09-27 DIAGNOSIS — E039 Hypothyroidism, unspecified: Secondary | ICD-10-CM | POA: Diagnosis not present

## 2021-09-27 DIAGNOSIS — I4891 Unspecified atrial fibrillation: Secondary | ICD-10-CM | POA: Diagnosis not present

## 2021-09-27 DIAGNOSIS — Z7901 Long term (current) use of anticoagulants: Secondary | ICD-10-CM | POA: Diagnosis not present

## 2021-09-27 DIAGNOSIS — Z794 Long term (current) use of insulin: Secondary | ICD-10-CM | POA: Diagnosis not present

## 2021-09-27 DIAGNOSIS — E1151 Type 2 diabetes mellitus with diabetic peripheral angiopathy without gangrene: Secondary | ICD-10-CM | POA: Diagnosis not present

## 2021-09-27 DIAGNOSIS — J449 Chronic obstructive pulmonary disease, unspecified: Secondary | ICD-10-CM | POA: Diagnosis not present

## 2021-09-27 DIAGNOSIS — R69 Illness, unspecified: Secondary | ICD-10-CM | POA: Diagnosis not present

## 2021-09-27 DIAGNOSIS — S72001D Fracture of unspecified part of neck of right femur, subsequent encounter for closed fracture with routine healing: Secondary | ICD-10-CM | POA: Diagnosis not present

## 2021-09-27 DIAGNOSIS — I10 Essential (primary) hypertension: Secondary | ICD-10-CM | POA: Diagnosis not present

## 2021-09-28 ENCOUNTER — Other Ambulatory Visit: Payer: Self-pay

## 2021-09-28 DIAGNOSIS — S72141D Displaced intertrochanteric fracture of right femur, subsequent encounter for closed fracture with routine healing: Secondary | ICD-10-CM | POA: Diagnosis not present

## 2021-09-28 MED ORDER — ATORVASTATIN CALCIUM 80 MG PO TABS: 80.0000 mg | ORAL_TABLET | ORAL | 3 refills | Status: DC

## 2021-09-29 ENCOUNTER — Other Ambulatory Visit: Payer: Self-pay | Admitting: Internal Medicine

## 2021-09-29 ENCOUNTER — Ambulatory Visit
Admission: RE | Admit: 2021-09-29 | Discharge: 2021-09-29 | Disposition: A | Discharge status: Home / Self Care | Payer: Medicare HMO | Source: Ambulatory Visit | Attending: Internal Medicine | Admitting: Internal Medicine

## 2021-09-29 DIAGNOSIS — E039 Hypothyroidism, unspecified: Secondary | ICD-10-CM | POA: Diagnosis not present

## 2021-09-29 DIAGNOSIS — R69 Illness, unspecified: Secondary | ICD-10-CM | POA: Diagnosis not present

## 2021-09-29 DIAGNOSIS — I4891 Unspecified atrial fibrillation: Secondary | ICD-10-CM | POA: Diagnosis not present

## 2021-09-29 DIAGNOSIS — Z9181 History of falling: Secondary | ICD-10-CM | POA: Diagnosis not present

## 2021-09-29 DIAGNOSIS — Z7901 Long term (current) use of anticoagulants: Secondary | ICD-10-CM | POA: Diagnosis not present

## 2021-09-29 DIAGNOSIS — R9389 Abnormal findings on diagnostic imaging of other specified body structures: Secondary | ICD-10-CM

## 2021-09-29 DIAGNOSIS — M1990 Unspecified osteoarthritis, unspecified site: Secondary | ICD-10-CM | POA: Diagnosis not present

## 2021-09-29 DIAGNOSIS — I10 Essential (primary) hypertension: Secondary | ICD-10-CM | POA: Diagnosis not present

## 2021-09-29 DIAGNOSIS — E1151 Type 2 diabetes mellitus with diabetic peripheral angiopathy without gangrene: Secondary | ICD-10-CM | POA: Diagnosis not present

## 2021-09-29 DIAGNOSIS — S72001D Fracture of unspecified part of neck of right femur, subsequent encounter for closed fracture with routine healing: Secondary | ICD-10-CM | POA: Diagnosis not present

## 2021-09-29 DIAGNOSIS — Z794 Long term (current) use of insulin: Secondary | ICD-10-CM | POA: Diagnosis not present

## 2021-09-29 DIAGNOSIS — J449 Chronic obstructive pulmonary disease, unspecified: Secondary | ICD-10-CM | POA: Diagnosis not present

## 2021-09-30 DIAGNOSIS — J449 Chronic obstructive pulmonary disease, unspecified: Secondary | ICD-10-CM | POA: Diagnosis not present

## 2021-09-30 DIAGNOSIS — Z794 Long term (current) use of insulin: Secondary | ICD-10-CM | POA: Diagnosis not present

## 2021-09-30 DIAGNOSIS — I4891 Unspecified atrial fibrillation: Secondary | ICD-10-CM | POA: Diagnosis not present

## 2021-09-30 DIAGNOSIS — M1990 Unspecified osteoarthritis, unspecified site: Secondary | ICD-10-CM | POA: Diagnosis not present

## 2021-09-30 DIAGNOSIS — Z9181 History of falling: Secondary | ICD-10-CM | POA: Diagnosis not present

## 2021-09-30 DIAGNOSIS — E039 Hypothyroidism, unspecified: Secondary | ICD-10-CM | POA: Diagnosis not present

## 2021-09-30 DIAGNOSIS — I10 Essential (primary) hypertension: Secondary | ICD-10-CM | POA: Diagnosis not present

## 2021-09-30 DIAGNOSIS — R69 Illness, unspecified: Secondary | ICD-10-CM | POA: Diagnosis not present

## 2021-09-30 DIAGNOSIS — Z7901 Long term (current) use of anticoagulants: Secondary | ICD-10-CM | POA: Diagnosis not present

## 2021-09-30 DIAGNOSIS — S72001D Fracture of unspecified part of neck of right femur, subsequent encounter for closed fracture with routine healing: Secondary | ICD-10-CM | POA: Diagnosis not present

## 2021-09-30 DIAGNOSIS — E1151 Type 2 diabetes mellitus with diabetic peripheral angiopathy without gangrene: Secondary | ICD-10-CM | POA: Diagnosis not present

## 2021-10-01 DIAGNOSIS — R69 Illness, unspecified: Secondary | ICD-10-CM | POA: Diagnosis not present

## 2021-10-01 DIAGNOSIS — I10 Essential (primary) hypertension: Secondary | ICD-10-CM | POA: Diagnosis not present

## 2021-10-01 DIAGNOSIS — M1990 Unspecified osteoarthritis, unspecified site: Secondary | ICD-10-CM | POA: Diagnosis not present

## 2021-10-01 DIAGNOSIS — I4891 Unspecified atrial fibrillation: Secondary | ICD-10-CM | POA: Diagnosis not present

## 2021-10-01 DIAGNOSIS — Z7901 Long term (current) use of anticoagulants: Secondary | ICD-10-CM | POA: Diagnosis not present

## 2021-10-01 DIAGNOSIS — E1151 Type 2 diabetes mellitus with diabetic peripheral angiopathy without gangrene: Secondary | ICD-10-CM | POA: Diagnosis not present

## 2021-10-01 DIAGNOSIS — Z794 Long term (current) use of insulin: Secondary | ICD-10-CM | POA: Diagnosis not present

## 2021-10-01 DIAGNOSIS — Z9181 History of falling: Secondary | ICD-10-CM | POA: Diagnosis not present

## 2021-10-01 DIAGNOSIS — E039 Hypothyroidism, unspecified: Secondary | ICD-10-CM | POA: Diagnosis not present

## 2021-10-01 DIAGNOSIS — S72001D Fracture of unspecified part of neck of right femur, subsequent encounter for closed fracture with routine healing: Secondary | ICD-10-CM | POA: Diagnosis not present

## 2021-10-01 DIAGNOSIS — J449 Chronic obstructive pulmonary disease, unspecified: Secondary | ICD-10-CM | POA: Diagnosis not present

## 2021-10-05 DIAGNOSIS — M1990 Unspecified osteoarthritis, unspecified site: Secondary | ICD-10-CM | POA: Diagnosis not present

## 2021-10-05 DIAGNOSIS — Z7901 Long term (current) use of anticoagulants: Secondary | ICD-10-CM | POA: Diagnosis not present

## 2021-10-05 DIAGNOSIS — J449 Chronic obstructive pulmonary disease, unspecified: Secondary | ICD-10-CM | POA: Diagnosis not present

## 2021-10-05 DIAGNOSIS — I10 Essential (primary) hypertension: Secondary | ICD-10-CM | POA: Diagnosis not present

## 2021-10-05 DIAGNOSIS — Z794 Long term (current) use of insulin: Secondary | ICD-10-CM | POA: Diagnosis not present

## 2021-10-05 DIAGNOSIS — Z9181 History of falling: Secondary | ICD-10-CM | POA: Diagnosis not present

## 2021-10-05 DIAGNOSIS — E039 Hypothyroidism, unspecified: Secondary | ICD-10-CM | POA: Diagnosis not present

## 2021-10-05 DIAGNOSIS — R69 Illness, unspecified: Secondary | ICD-10-CM | POA: Diagnosis not present

## 2021-10-05 DIAGNOSIS — I4891 Unspecified atrial fibrillation: Secondary | ICD-10-CM | POA: Diagnosis not present

## 2021-10-05 DIAGNOSIS — S72001D Fracture of unspecified part of neck of right femur, subsequent encounter for closed fracture with routine healing: Secondary | ICD-10-CM | POA: Diagnosis not present

## 2021-10-05 DIAGNOSIS — E1151 Type 2 diabetes mellitus with diabetic peripheral angiopathy without gangrene: Secondary | ICD-10-CM | POA: Diagnosis not present

## 2021-10-06 DIAGNOSIS — I4891 Unspecified atrial fibrillation: Secondary | ICD-10-CM | POA: Diagnosis not present

## 2021-10-06 DIAGNOSIS — Z9181 History of falling: Secondary | ICD-10-CM | POA: Diagnosis not present

## 2021-10-06 DIAGNOSIS — Z7901 Long term (current) use of anticoagulants: Secondary | ICD-10-CM | POA: Diagnosis not present

## 2021-10-06 DIAGNOSIS — M1990 Unspecified osteoarthritis, unspecified site: Secondary | ICD-10-CM | POA: Diagnosis not present

## 2021-10-06 DIAGNOSIS — Z794 Long term (current) use of insulin: Secondary | ICD-10-CM | POA: Diagnosis not present

## 2021-10-06 DIAGNOSIS — R69 Illness, unspecified: Secondary | ICD-10-CM | POA: Diagnosis not present

## 2021-10-06 DIAGNOSIS — I10 Essential (primary) hypertension: Secondary | ICD-10-CM | POA: Diagnosis not present

## 2021-10-06 DIAGNOSIS — E039 Hypothyroidism, unspecified: Secondary | ICD-10-CM | POA: Diagnosis not present

## 2021-10-06 DIAGNOSIS — S72001D Fracture of unspecified part of neck of right femur, subsequent encounter for closed fracture with routine healing: Secondary | ICD-10-CM | POA: Diagnosis not present

## 2021-10-06 DIAGNOSIS — E1151 Type 2 diabetes mellitus with diabetic peripheral angiopathy without gangrene: Secondary | ICD-10-CM | POA: Diagnosis not present

## 2021-10-06 DIAGNOSIS — J449 Chronic obstructive pulmonary disease, unspecified: Secondary | ICD-10-CM | POA: Diagnosis not present

## 2021-10-08 DIAGNOSIS — Z0000 Encounter for general adult medical examination without abnormal findings: Secondary | ICD-10-CM | POA: Diagnosis not present

## 2021-10-08 DIAGNOSIS — J449 Chronic obstructive pulmonary disease, unspecified: Secondary | ICD-10-CM | POA: Diagnosis not present

## 2021-10-08 DIAGNOSIS — I480 Paroxysmal atrial fibrillation: Secondary | ICD-10-CM | POA: Diagnosis not present

## 2021-10-08 DIAGNOSIS — I10 Essential (primary) hypertension: Secondary | ICD-10-CM | POA: Diagnosis not present

## 2021-10-08 DIAGNOSIS — I700 Atherosclerosis of aorta: Secondary | ICD-10-CM | POA: Diagnosis not present

## 2021-10-08 DIAGNOSIS — D500 Iron deficiency anemia secondary to blood loss (chronic): Secondary | ICD-10-CM | POA: Diagnosis not present

## 2021-10-08 DIAGNOSIS — E039 Hypothyroidism, unspecified: Secondary | ICD-10-CM | POA: Diagnosis not present

## 2021-10-08 DIAGNOSIS — M810 Age-related osteoporosis without current pathological fracture: Secondary | ICD-10-CM | POA: Diagnosis not present

## 2021-10-08 DIAGNOSIS — Z794 Long term (current) use of insulin: Secondary | ICD-10-CM | POA: Diagnosis not present

## 2021-10-08 DIAGNOSIS — R69 Illness, unspecified: Secondary | ICD-10-CM | POA: Diagnosis not present

## 2021-10-08 DIAGNOSIS — N1832 Chronic kidney disease, stage 3b: Secondary | ICD-10-CM | POA: Diagnosis not present

## 2021-10-09 DIAGNOSIS — M1990 Unspecified osteoarthritis, unspecified site: Secondary | ICD-10-CM | POA: Diagnosis not present

## 2021-10-09 DIAGNOSIS — Z794 Long term (current) use of insulin: Secondary | ICD-10-CM | POA: Diagnosis not present

## 2021-10-09 DIAGNOSIS — Z9181 History of falling: Secondary | ICD-10-CM | POA: Diagnosis not present

## 2021-10-09 DIAGNOSIS — E039 Hypothyroidism, unspecified: Secondary | ICD-10-CM | POA: Diagnosis not present

## 2021-10-09 DIAGNOSIS — R69 Illness, unspecified: Secondary | ICD-10-CM | POA: Diagnosis not present

## 2021-10-09 DIAGNOSIS — I10 Essential (primary) hypertension: Secondary | ICD-10-CM | POA: Diagnosis not present

## 2021-10-09 DIAGNOSIS — Z7901 Long term (current) use of anticoagulants: Secondary | ICD-10-CM | POA: Diagnosis not present

## 2021-10-09 DIAGNOSIS — I4891 Unspecified atrial fibrillation: Secondary | ICD-10-CM | POA: Diagnosis not present

## 2021-10-09 DIAGNOSIS — S72001D Fracture of unspecified part of neck of right femur, subsequent encounter for closed fracture with routine healing: Secondary | ICD-10-CM | POA: Diagnosis not present

## 2021-10-09 DIAGNOSIS — E1151 Type 2 diabetes mellitus with diabetic peripheral angiopathy without gangrene: Secondary | ICD-10-CM | POA: Diagnosis not present

## 2021-10-09 DIAGNOSIS — J449 Chronic obstructive pulmonary disease, unspecified: Secondary | ICD-10-CM | POA: Diagnosis not present

## 2021-10-11 DIAGNOSIS — M1990 Unspecified osteoarthritis, unspecified site: Secondary | ICD-10-CM | POA: Diagnosis not present

## 2021-10-11 DIAGNOSIS — S72001D Fracture of unspecified part of neck of right femur, subsequent encounter for closed fracture with routine healing: Secondary | ICD-10-CM | POA: Diagnosis not present

## 2021-10-11 DIAGNOSIS — J449 Chronic obstructive pulmonary disease, unspecified: Secondary | ICD-10-CM | POA: Diagnosis not present

## 2021-10-11 DIAGNOSIS — R69 Illness, unspecified: Secondary | ICD-10-CM | POA: Diagnosis not present

## 2021-10-11 DIAGNOSIS — Z7901 Long term (current) use of anticoagulants: Secondary | ICD-10-CM | POA: Diagnosis not present

## 2021-10-11 DIAGNOSIS — E039 Hypothyroidism, unspecified: Secondary | ICD-10-CM | POA: Diagnosis not present

## 2021-10-11 DIAGNOSIS — I4891 Unspecified atrial fibrillation: Secondary | ICD-10-CM | POA: Diagnosis not present

## 2021-10-11 DIAGNOSIS — I10 Essential (primary) hypertension: Secondary | ICD-10-CM | POA: Diagnosis not present

## 2021-10-11 DIAGNOSIS — Z9181 History of falling: Secondary | ICD-10-CM | POA: Diagnosis not present

## 2021-10-11 DIAGNOSIS — Z794 Long term (current) use of insulin: Secondary | ICD-10-CM | POA: Diagnosis not present

## 2021-10-11 DIAGNOSIS — E1151 Type 2 diabetes mellitus with diabetic peripheral angiopathy without gangrene: Secondary | ICD-10-CM | POA: Diagnosis not present

## 2021-10-13 ENCOUNTER — Other Ambulatory Visit: Payer: Self-pay | Admitting: Cardiovascular Disease

## 2021-10-13 DIAGNOSIS — I10 Essential (primary) hypertension: Secondary | ICD-10-CM | POA: Diagnosis not present

## 2021-10-13 DIAGNOSIS — M1990 Unspecified osteoarthritis, unspecified site: Secondary | ICD-10-CM | POA: Diagnosis not present

## 2021-10-13 DIAGNOSIS — I480 Paroxysmal atrial fibrillation: Secondary | ICD-10-CM

## 2021-10-13 DIAGNOSIS — E039 Hypothyroidism, unspecified: Secondary | ICD-10-CM | POA: Diagnosis not present

## 2021-10-13 DIAGNOSIS — J449 Chronic obstructive pulmonary disease, unspecified: Secondary | ICD-10-CM | POA: Diagnosis not present

## 2021-10-13 DIAGNOSIS — E1151 Type 2 diabetes mellitus with diabetic peripheral angiopathy without gangrene: Secondary | ICD-10-CM | POA: Diagnosis not present

## 2021-10-13 DIAGNOSIS — R69 Illness, unspecified: Secondary | ICD-10-CM | POA: Diagnosis not present

## 2021-10-13 DIAGNOSIS — Z9181 History of falling: Secondary | ICD-10-CM | POA: Diagnosis not present

## 2021-10-13 DIAGNOSIS — I4891 Unspecified atrial fibrillation: Secondary | ICD-10-CM | POA: Diagnosis not present

## 2021-10-13 DIAGNOSIS — Z7901 Long term (current) use of anticoagulants: Secondary | ICD-10-CM | POA: Diagnosis not present

## 2021-10-13 DIAGNOSIS — S72001D Fracture of unspecified part of neck of right femur, subsequent encounter for closed fracture with routine healing: Secondary | ICD-10-CM | POA: Diagnosis not present

## 2021-10-13 DIAGNOSIS — Z794 Long term (current) use of insulin: Secondary | ICD-10-CM | POA: Diagnosis not present

## 2021-10-18 DIAGNOSIS — Z9181 History of falling: Secondary | ICD-10-CM | POA: Diagnosis not present

## 2021-10-18 DIAGNOSIS — E1151 Type 2 diabetes mellitus with diabetic peripheral angiopathy without gangrene: Secondary | ICD-10-CM | POA: Diagnosis not present

## 2021-10-18 DIAGNOSIS — E039 Hypothyroidism, unspecified: Secondary | ICD-10-CM | POA: Diagnosis not present

## 2021-10-18 DIAGNOSIS — I4891 Unspecified atrial fibrillation: Secondary | ICD-10-CM | POA: Diagnosis not present

## 2021-10-18 DIAGNOSIS — S72001D Fracture of unspecified part of neck of right femur, subsequent encounter for closed fracture with routine healing: Secondary | ICD-10-CM | POA: Diagnosis not present

## 2021-10-18 DIAGNOSIS — R69 Illness, unspecified: Secondary | ICD-10-CM | POA: Diagnosis not present

## 2021-10-18 DIAGNOSIS — Z7901 Long term (current) use of anticoagulants: Secondary | ICD-10-CM | POA: Diagnosis not present

## 2021-10-18 DIAGNOSIS — M1990 Unspecified osteoarthritis, unspecified site: Secondary | ICD-10-CM | POA: Diagnosis not present

## 2021-10-18 DIAGNOSIS — Z794 Long term (current) use of insulin: Secondary | ICD-10-CM | POA: Diagnosis not present

## 2021-10-18 DIAGNOSIS — I10 Essential (primary) hypertension: Secondary | ICD-10-CM | POA: Diagnosis not present

## 2021-10-18 DIAGNOSIS — J449 Chronic obstructive pulmonary disease, unspecified: Secondary | ICD-10-CM | POA: Diagnosis not present

## 2021-10-20 DIAGNOSIS — R69 Illness, unspecified: Secondary | ICD-10-CM | POA: Diagnosis not present

## 2021-10-20 DIAGNOSIS — I10 Essential (primary) hypertension: Secondary | ICD-10-CM | POA: Diagnosis not present

## 2021-10-20 DIAGNOSIS — Z7901 Long term (current) use of anticoagulants: Secondary | ICD-10-CM | POA: Diagnosis not present

## 2021-10-20 DIAGNOSIS — S72001D Fracture of unspecified part of neck of right femur, subsequent encounter for closed fracture with routine healing: Secondary | ICD-10-CM | POA: Diagnosis not present

## 2021-10-20 DIAGNOSIS — E1151 Type 2 diabetes mellitus with diabetic peripheral angiopathy without gangrene: Secondary | ICD-10-CM | POA: Diagnosis not present

## 2021-10-20 DIAGNOSIS — I4891 Unspecified atrial fibrillation: Secondary | ICD-10-CM | POA: Diagnosis not present

## 2021-10-20 DIAGNOSIS — J449 Chronic obstructive pulmonary disease, unspecified: Secondary | ICD-10-CM | POA: Diagnosis not present

## 2021-10-20 DIAGNOSIS — Z9181 History of falling: Secondary | ICD-10-CM | POA: Diagnosis not present

## 2021-10-20 DIAGNOSIS — E039 Hypothyroidism, unspecified: Secondary | ICD-10-CM | POA: Diagnosis not present

## 2021-10-20 DIAGNOSIS — M1990 Unspecified osteoarthritis, unspecified site: Secondary | ICD-10-CM | POA: Diagnosis not present

## 2021-10-20 DIAGNOSIS — Z794 Long term (current) use of insulin: Secondary | ICD-10-CM | POA: Diagnosis not present

## 2021-10-21 DIAGNOSIS — I10 Essential (primary) hypertension: Secondary | ICD-10-CM | POA: Diagnosis not present

## 2021-10-21 DIAGNOSIS — E039 Hypothyroidism, unspecified: Secondary | ICD-10-CM | POA: Diagnosis not present

## 2021-10-21 DIAGNOSIS — Z9181 History of falling: Secondary | ICD-10-CM | POA: Diagnosis not present

## 2021-10-21 DIAGNOSIS — S72001D Fracture of unspecified part of neck of right femur, subsequent encounter for closed fracture with routine healing: Secondary | ICD-10-CM | POA: Diagnosis not present

## 2021-10-21 DIAGNOSIS — M1990 Unspecified osteoarthritis, unspecified site: Secondary | ICD-10-CM | POA: Diagnosis not present

## 2021-10-21 DIAGNOSIS — I4891 Unspecified atrial fibrillation: Secondary | ICD-10-CM | POA: Diagnosis not present

## 2021-10-21 DIAGNOSIS — R69 Illness, unspecified: Secondary | ICD-10-CM | POA: Diagnosis not present

## 2021-10-21 DIAGNOSIS — E1151 Type 2 diabetes mellitus with diabetic peripheral angiopathy without gangrene: Secondary | ICD-10-CM | POA: Diagnosis not present

## 2021-10-21 DIAGNOSIS — Z794 Long term (current) use of insulin: Secondary | ICD-10-CM | POA: Diagnosis not present

## 2021-10-21 DIAGNOSIS — J449 Chronic obstructive pulmonary disease, unspecified: Secondary | ICD-10-CM | POA: Diagnosis not present

## 2021-10-21 DIAGNOSIS — Z7901 Long term (current) use of anticoagulants: Secondary | ICD-10-CM | POA: Diagnosis not present

## 2021-10-25 DIAGNOSIS — Z9181 History of falling: Secondary | ICD-10-CM | POA: Diagnosis not present

## 2021-10-25 DIAGNOSIS — J449 Chronic obstructive pulmonary disease, unspecified: Secondary | ICD-10-CM | POA: Diagnosis not present

## 2021-10-25 DIAGNOSIS — M1990 Unspecified osteoarthritis, unspecified site: Secondary | ICD-10-CM | POA: Diagnosis not present

## 2021-10-25 DIAGNOSIS — S72001D Fracture of unspecified part of neck of right femur, subsequent encounter for closed fracture with routine healing: Secondary | ICD-10-CM | POA: Diagnosis not present

## 2021-10-25 DIAGNOSIS — I10 Essential (primary) hypertension: Secondary | ICD-10-CM | POA: Diagnosis not present

## 2021-10-25 DIAGNOSIS — Z794 Long term (current) use of insulin: Secondary | ICD-10-CM | POA: Diagnosis not present

## 2021-10-25 DIAGNOSIS — E1151 Type 2 diabetes mellitus with diabetic peripheral angiopathy without gangrene: Secondary | ICD-10-CM | POA: Diagnosis not present

## 2021-10-25 DIAGNOSIS — R69 Illness, unspecified: Secondary | ICD-10-CM | POA: Diagnosis not present

## 2021-10-25 DIAGNOSIS — Z7901 Long term (current) use of anticoagulants: Secondary | ICD-10-CM | POA: Diagnosis not present

## 2021-10-25 DIAGNOSIS — E039 Hypothyroidism, unspecified: Secondary | ICD-10-CM | POA: Diagnosis not present

## 2021-10-25 DIAGNOSIS — I4891 Unspecified atrial fibrillation: Secondary | ICD-10-CM | POA: Diagnosis not present

## 2021-10-26 DIAGNOSIS — S72141D Displaced intertrochanteric fracture of right femur, subsequent encounter for closed fracture with routine healing: Secondary | ICD-10-CM | POA: Diagnosis not present

## 2021-10-27 DIAGNOSIS — Z7901 Long term (current) use of anticoagulants: Secondary | ICD-10-CM | POA: Diagnosis not present

## 2021-10-27 DIAGNOSIS — Z794 Long term (current) use of insulin: Secondary | ICD-10-CM | POA: Diagnosis not present

## 2021-10-27 DIAGNOSIS — Z9181 History of falling: Secondary | ICD-10-CM | POA: Diagnosis not present

## 2021-10-27 DIAGNOSIS — J449 Chronic obstructive pulmonary disease, unspecified: Secondary | ICD-10-CM | POA: Diagnosis not present

## 2021-10-27 DIAGNOSIS — I10 Essential (primary) hypertension: Secondary | ICD-10-CM | POA: Diagnosis not present

## 2021-10-27 DIAGNOSIS — S72001D Fracture of unspecified part of neck of right femur, subsequent encounter for closed fracture with routine healing: Secondary | ICD-10-CM | POA: Diagnosis not present

## 2021-10-27 DIAGNOSIS — R69 Illness, unspecified: Secondary | ICD-10-CM | POA: Diagnosis not present

## 2021-10-27 DIAGNOSIS — E039 Hypothyroidism, unspecified: Secondary | ICD-10-CM | POA: Diagnosis not present

## 2021-10-27 DIAGNOSIS — E1151 Type 2 diabetes mellitus with diabetic peripheral angiopathy without gangrene: Secondary | ICD-10-CM | POA: Diagnosis not present

## 2021-10-27 DIAGNOSIS — M1990 Unspecified osteoarthritis, unspecified site: Secondary | ICD-10-CM | POA: Diagnosis not present

## 2021-10-27 DIAGNOSIS — I4891 Unspecified atrial fibrillation: Secondary | ICD-10-CM | POA: Diagnosis not present

## 2021-11-15 ENCOUNTER — Ambulatory Visit: Payer: Medicare HMO | Admitting: Podiatrist

## 2021-11-15 ENCOUNTER — Encounter: Payer: Self-pay | Admitting: Podiatrist

## 2021-11-15 DIAGNOSIS — L608 Other nail disorders: Secondary | ICD-10-CM

## 2021-11-15 DIAGNOSIS — L89606 Pressure-induced deep tissue damage of unspecified heel: Secondary | ICD-10-CM | POA: Diagnosis not present

## 2021-11-15 DIAGNOSIS — B351 Tinea unguium: Secondary | ICD-10-CM | POA: Diagnosis not present

## 2021-11-15 DIAGNOSIS — M79674 Pain in right toe(s): Secondary | ICD-10-CM

## 2021-11-15 DIAGNOSIS — M79675 Pain in left toe(s): Secondary | ICD-10-CM

## 2021-11-15 NOTE — Patient Instructions (Signed)
Pressure Injury  A pressure injury is damage to the skin and underlying tissue that results from pressure being applied to an area of the body. It often affects people who must spend a long time in a bed or chair because of a medical condition. Pressure injuries usually occur: Over bony parts of the body, such as the tailbone, shoulders, elbows, hips, heels, spine, ankles, and back of the head. Under medical devices that make contact with the body, such as respiratory equipment, stockings, tubes, and splints. Pressure injuries start as reddened areas on the skin and can lead to pain and an open wound. What are the causes? This condition is caused by frequent or constant pressure to an area of the body. Decreased blood flow to the skin can eventually cause the skin tissue to die and break down, causing a wound. What increases the risk? You are more likely to develop this condition if you: Are in the hospital or an extended care facility. Are bedridden or in a wheelchair. Have an injury or disease that keeps you from: Moving normally. Feeling pain or pressure. Have a condition that: Makes you sleepy or less alert. Causes poor blood flow. Need to wear a medical device. Have poor control of your bladder or bowel functions (incontinence). Have poor nutrition (malnutrition). If you are at risk for pressure injuries, your health care provider may recommend certain types of mattresses, mattress covers, pillows, cushions, or boots to help prevent them. These may include products filled with air, foam, gel, or sand. What are the signs or symptoms? Symptoms of this condition depend on the severity of the injury. Symptoms may include: Red or dark areas of the skin. Pain, warmth, or a change of skin texture. Blisters. An open wound. How is this diagnosed? This condition is diagnosed with a medical history and physical exam. You may also have tests, such as: Blood tests. Imaging tests. Blood flow  tests. Your pressure injury will be staged based on its severity. Staging is based on: The depth of the tissue injury, including whether there is exposure of muscle, bone, or tendon. The cause of the pressure injury. How is this treated? This condition may be treated by: Relieving or redistributing pressure on your skin. This includes: Frequently changing your position. Avoiding positions that caused the wound or that can make the wound worse. Using specific bed mattresses, chair cushions, or protective boots. Moving medical devices from an area of pressure, or placing padding between the skin and the device. Using foams, creams, or powders to prevent rubbing (friction) on the skin. Keeping your skin clean and dry. This may include using a skin cleanser or skin barrier as told by your health care provider. Cleaning your injury and removing any dead tissue from the wound (debridement). Placing a bandage (dressing) over your injury. Using medicines for pain or to prevent or treat infection. Surgery may be needed if other treatments are not working or if your injury is very deep. Follow these instructions at home: Wound care Follow instructions from your health care provider about how to take care of your wound. Make sure you: Wash your hands with soap and water before and after you change your bandage (dressing). If soap and water are not available, use hand sanitizer. Change your dressing as told by your health care provider.  Check your wound every day for signs of infection. Have a caregiver do this for you if you are not able. Check for: Redness, swelling, or increased pain. More   fluid or blood. Warmth. Pus or a bad smell. Skin care Keep your skin clean and dry. Gently pat your skin dry. Do not rub or massage your skin. You or a caregiver should check your skin every day for any changes in color or any new blisters or sores (ulcers). Medicines Take over-the-counter and prescription  medicines only as told by your health care provider. If you were prescribed an antibiotic medicine, take or apply it as told by your health care provider. Do not stop using the antibiotic even if your condition improves. Reducing and redistributing pressure Do not lie or sit in one position for a long time. Move or change position every 1-2 hours, or as told by your health care provider. Use pillows or cushions to reduce pressure. Ask your health care provider to recommend cushions or pads for you. General instructions  Eat a healthy diet that includes lots of protein. Drink enough fluid to keep your urine pale yellow. Be as active as you can every day. Ask your health care provider to suggest safe exercises or activities. Do not abuse drugs or alcohol. Do not use any products that contain nicotine or tobacco, such as cigarettes, e-cigarettes, and chewing tobacco. If you need help quitting, ask your health care provider. Keep all follow-up visits as told by your health care provider. This is important. Contact a health care provider if: You have: A fever or chills. Pain that is not helped by medicine. Any changes in skin color. New blisters or sores. Pus or a bad smell coming from your wound. Redness, swelling, or pain around your wound. More fluid or blood coming from your wound. Your wound does not improve after 1-2 weeks of treatment. Summary A pressure injury is damage to the skin and underlying tissue that results from pressure being applied to an area of the body. Do not lie or sit in one position for a long time. Your health care provider may advise you to move or change position every 1-2 hours. Follow instructions from your health care provider about how to take care of your wound. Keep all follow-up visits as told by your health care provider. This is important. This information is not intended to replace advice given to you by your health care provider. Make sure you discuss  any questions you have with your health care provider. Document Revised: 04/08/2021 Document Reviewed: 04/08/2021 Elsevier Patient Education  2023 [employer] Inc.  

## 2021-11-15 NOTE — Progress Notes (Signed)
Chief Complaint  Ariel Cobb presents with   Nail Problem     HPI: Ariel Cobb is 76 y.o. female who presents today for painful heels and for trimming of toenails.  She relates she had a broken hip and went to rehab where she developed pain on the back of the heels while at rehab.  She relates she has a scab that formed and the pain is getting better slowly now that she is at home and caring for herself.  She is on a blood thinner and states her nails bleed easily.   Ariel Cobb Active Problem List   Diagnosis Date Noted   Nicotine dependence, cigarettes, uncomplicated 19/62/2297   Closed right hip fracture, initial encounter ([place]) 08/05/2021   Anticoagulated 01/28/2019   COPD (chronic obstructive pulmonary disease) ([place]) 01/28/2019   Paroxysmal atrial fibrillation ([place]) 01/10/2019   Hypothyroidism    Type II diabetes mellitus ([place])    Chronic kidney disease, stage 3b ([place])    PAD (peripheral artery disease) ([place])    Essential hypertension 05/06/2013   Mixed hyperlipidemia due to type 2 diabetes mellitus ([place]) 05/06/2013   Type 2 diabetes mellitus with stage 3b chronic kidney disease, with long-term current use of insulin ([place]) 05/06/2013   Tobacco abuse 05/06/2013   Claudication ([place]) 05/06/2013    Current Outpatient Medications on File Prior to Visit  Medication Sig Dispense Refill   albuterol (PROVENTIL HFA;VENTOLIN HFA) 108 (90 BASE) MCG/ACT inhaler Inhale into the lungs every 6 (six) hours as needed for wheezing or shortness of breath.     amLODipine (NORVASC) 10 MG tablet Take 1 tablet (10 mg total) by mouth daily.     apixaban (ELIQUIS) 2.5 MG TABS tablet Take 1 tablet (2.5 mg total) by mouth 2 (two) times daily. 60 tablet    atorvastatin (LIPITOR) 80 MG tablet Take 1 tablet (80 mg total) by mouth every morning. 100 tablet 3   budesonide-formoterol (SYMBICORT) 160-4.5 MCG/ACT inhaler Inhale 2 puffs into the lungs 2 (two) times daily.     Calcium Carb-Cholecalciferol 600-800 MG-UNIT TABS  Take 1 tablet by mouth daily.      clobetasol (TEMOVATE) 0.05 % external solution 1 application 2 (two) times daily as needed (rash).     docusate sodium (COLACE) 100 MG capsule Take 2 capsules (200 mg total) by mouth 2 (two) times daily. 10 capsule 0   ferrous sulfate 325 (65 FE) MG tablet Take 325 mg by mouth 3 (three) times a week. Mon, Wed, Friday     insulin glargine (LANTUS) 100 UNIT/ML Solostar Pen Inject 8 Units into the skin daily at 2 PM.      levothyroxine (SYNTHROID) 88 MCG tablet Take 88 mcg by mouth daily before breakfast.     metoprolol succinate (TOPROL-XL) 25 MG 24 hr tablet TAKE 1 TABLET BY MOUTH EVERY DAY 90 tablet 0   NOVOLOG FLEXPEN 100 UNIT/ML FlexPen Inject 4-6 Units into the skin 3 (three) times daily. Inject 4 units in the morning, 5 units at lunch.   6 units in the evening  (Sliding scale)  11   ONE TOUCH ULTRA TEST test strip TEST THREE TIMES DAILY E11.22  4   oxyCODONE-acetaminophen (PERCOCET/ROXICET) 5-325 MG tablet Take 1 tablet by mouth every 4 (four) hours as needed for moderate pain or severe pain. 42 tablet 0   polyethylene glycol (MIRALAX / GLYCOLAX) 17 g packet Take 17 g by mouth daily. 14 each 0   sodium chloride (OCEAN) 0.65 % SOLN nasal spray Place 1 spray into  both nostrils as needed for congestion.     No current facility-administered medications on file prior to visit.    Allergies  Allergen Reactions   Adhesive [Tape] Itching    Surgical Tape - "makes me itch and break me out" -- HAS TO USE PAPER TAPE   Amoxicillin Hives, Itching and Swelling   Latex Itching   Other Other (See Comments)   Pioglitazone Other (See Comments)    Review of Systems No fevers, chills, nausea, muscle aches, no difficulty breathing, no calf pain, no chest pain or shortness of breath.   Physical Exam  GENERAL APPEARANCE: Alert, conversant. Appropriately groomed. No acute distress.   VASCULAR: Pedal pulses fainlyt palpable 1/4 DP and PT bilateral.  Capillary refill  time is immediate to all digits,  Proximal to distal cooling it warm to warm.  Digital perfusion adequate.   NEUROLOGIC: sensation is intact to 5.07 monofilament at 5/5 sites bilateral.  Light touch is intact bilateral, vibratory sensation intact bilateral  MUSCULOSKELETAL: acceptable muscle strength, tone and stability bilateral.  No gross boney pedal deformities noted.    DERMATOLOGIC: skin is warm, supple, and dry.  Digital nails are thick, discolored, dystrophic, brittle with pincer nails noted x 10.    Posterior aspect of the right heel has a small stable scab present.  The left heel posterior aspect has a small amount of dried skin present.  Both signs of a deep pressure injury to the heels that are now improving with pressure relief efforts of the Ariel Cobb. No redness, no swelling, no bogginess of the tissues currently noted. Mild pain with direct pressure reported.     Assessment     ICD-10-CM   1. Pressure injury of deep tissue of heel, unspecified laterality  L89.606     2. Pain due to onychomycosis of toenails of both feet  B35.1    M79.675    M79.674     3. Pincer nail deformity  L60.8        Plan  Discussed exam findings with the Ariel Cobb and discussed continuing pressure relief of the heels by floating the heels on a pillow or getting a heel relieving foam bootie from [facility].   I debrided her nails carefully via nail nipper and power burr without iatrogenic incident.  Ariel Cobb was instructed on signs and symptoms of infection and was told to call immediately should any of these arise.   Recommended follow up in 3 months or instructed to call sooner if any pedal concerns arise.

## 2021-12-07 DIAGNOSIS — S72141D Displaced intertrochanteric fracture of right femur, subsequent encounter for closed fracture with routine healing: Secondary | ICD-10-CM | POA: Diagnosis not present

## 2021-12-21 ENCOUNTER — Other Ambulatory Visit: Payer: Self-pay | Admitting: Internal Medicine

## 2021-12-21 DIAGNOSIS — Z794 Long term (current) use of insulin: Secondary | ICD-10-CM | POA: Diagnosis not present

## 2021-12-21 DIAGNOSIS — S72009A Fracture of unspecified part of neck of unspecified femur, initial encounter for closed fracture: Secondary | ICD-10-CM | POA: Diagnosis not present

## 2021-12-21 DIAGNOSIS — N1832 Chronic kidney disease, stage 3b: Secondary | ICD-10-CM | POA: Diagnosis not present

## 2021-12-21 DIAGNOSIS — M810 Age-related osteoporosis without current pathological fracture: Secondary | ICD-10-CM | POA: Diagnosis not present

## 2021-12-21 DIAGNOSIS — E1022 Type 1 diabetes mellitus with diabetic chronic kidney disease: Secondary | ICD-10-CM | POA: Diagnosis not present

## 2021-12-21 DIAGNOSIS — Z720 Tobacco use: Secondary | ICD-10-CM | POA: Diagnosis not present

## 2021-12-21 DIAGNOSIS — S72001A Fracture of unspecified part of neck of right femur, initial encounter for closed fracture: Secondary | ICD-10-CM

## 2022-01-04 ENCOUNTER — Telehealth: Payer: Self-pay

## 2022-01-04 NOTE — Telephone Encounter (Signed)
While pt's sister was here for an office visit she asks if we can provide samples for the pt.   Medication samples have been provided to the patient.  Drug name: Eliquis 2.'5mg'$  Qty: 3 boxes LOT: [id number] Exp.Date: 07/2022

## 2022-01-12 ENCOUNTER — Other Ambulatory Visit: Payer: Self-pay | Admitting: Cardiovascular Disease

## 2022-01-12 DIAGNOSIS — I480 Paroxysmal atrial fibrillation: Secondary | ICD-10-CM

## 2022-02-15 ENCOUNTER — Ambulatory Visit (HOSPITAL_COMMUNITY)
Admission: RE | Admit: 2022-02-15 | Discharge: 2022-02-15 | Disposition: A | Discharge status: Home / Self Care | Payer: Medicare HMO | Source: Ambulatory Visit | Attending: Cardiovascular Disease | Admitting: Cardiovascular Disease

## 2022-02-15 ENCOUNTER — Other Ambulatory Visit: Payer: Self-pay | Admitting: Pharmacist Clinician (PhC)/ Clinical Pharmacy Specialist

## 2022-02-15 DIAGNOSIS — I739 Peripheral vascular disease, unspecified: Secondary | ICD-10-CM | POA: Diagnosis not present

## 2022-02-15 MED ORDER — APIXABAN 2.5 MG PO TABS: 2.5000 mg | ORAL_TABLET | Freq: Two times a day (BID) | ORAL | 1 refills | Status: DC

## 2022-02-24 ENCOUNTER — Telehealth: Payer: Self-pay | Admitting: Cardiovascular Disease

## 2022-02-24 MED ORDER — APIXABAN 2.5 MG PO TABS: 2.5000 mg | ORAL_TABLET | Freq: Two times a day (BID) | ORAL | 1 refills | Status: DC

## 2022-02-24 NOTE — Telephone Encounter (Signed)
Patietn stated she is supposed to take eliquis 2.'5mg'$  tablets twice daily, but her pharmacy filled 5 mg tablets. Patient has been taking the 5 mg tablets. Verified dose with PharmD K. Alvstad. Patient is to be taking eliquis 2.'5mg'$  tablet twice daily. She denies any signs of bleeding. Patient is not to cut the entire bottle of pills in half. For consistency of blood levels, patient was instructed to cut a pill in half each day and to take the first half of in the morning and the other half of it 12 hours later.  She verbalized understanding. Prescription or eliquis 2.'5mg'$  sent to her preferred pharmacy.

## 2022-02-24 NOTE — Telephone Encounter (Signed)
Pt c/o medication issue:  1. Name of Medication:  apixaban (ELIQUIS) 2.5 MG TABS tablet  2. How are you currently taking this medication (dosage and times per day)? 5 mg 2x daily   3. Are you having a reaction (difficulty breathing--STAT)?   4. What is your medication issue? Pt states at last visit the nurse told her she was to take 2.5 mg tabs 2x daily, but the prescription she has currently is 5 mg 2x daily. She would like a call back to get clarification on this and also to get refill. Please advise.

## 2022-03-08 ENCOUNTER — Telehealth: Payer: Self-pay | Admitting: Cardiovascular Disease

## 2022-03-08 NOTE — Telephone Encounter (Signed)
I spoke with sister who states she called because patient is "hard headed". Sister describes dizziness since early March. She uses walker to ambulate after falling and breaking hip in March.  Over the past two weeks sister notes patient appears SOB after walking and intermittent sensation of heart racing, but doesn't complain. No weight gain, LE swelling,current weight 115 lbs. BP this am 169/71. Patient is diabetic, checks glucose 3 times a day, usually runs over 100 mg/dl.   Patient has appointment tomorrow,sister plans to join patient.

## 2022-03-08 NOTE — Telephone Encounter (Addendum)
STAT if patient feels like he/she is going to faint   Are you dizzy now? When she stands up she gets dizzy.   Do you feel faint or have you passed out? no  Do you have any other symptoms? SOB, Heart racing that started about the past two weeks.   Have you checked your HR and BP (record if available)? 161/71    Pt c/o Shortness Of Breath: STAT if SOB developed within the last 24 hours or pt is noticeably SOB on the phone  1. Are you currently SOB (can you hear that pt is SOB on the phone)? Spoke with sister  2. How long have you been experiencing SOB? Off and on for about two weeks.   3. Are you SOB when sitting or when up moving around? both  4. Are you currently experiencing any other symptoms? dizziness  STAT if HR is under 50 or over 120 (normal HR is 60-100 beats per minute)  What is your heart rate? 71  Do you have a log of your heart rate readings (document readings)? no  Do you have any other symptoms? Dizziness and SOB. States she can feel her heart racing.    I did schedule patient for tomorrow with Dr. Gwenlyn Found at [facility].

## 2022-03-09 ENCOUNTER — Ambulatory Visit: Payer: Medicare HMO | Attending: Cardiovascular Disease | Admitting: Cardiovascular Disease

## 2022-03-09 ENCOUNTER — Encounter: Payer: Self-pay | Admitting: Cardiovascular Disease

## 2022-03-09 DIAGNOSIS — Z720 Tobacco use: Secondary | ICD-10-CM | POA: Diagnosis not present

## 2022-03-09 DIAGNOSIS — E1169 Type 2 diabetes mellitus with other specified complication: Secondary | ICD-10-CM | POA: Diagnosis not present

## 2022-03-09 DIAGNOSIS — I480 Paroxysmal atrial fibrillation: Secondary | ICD-10-CM

## 2022-03-09 DIAGNOSIS — I739 Peripheral vascular disease, unspecified: Secondary | ICD-10-CM | POA: Diagnosis not present

## 2022-03-09 DIAGNOSIS — I10 Essential (primary) hypertension: Secondary | ICD-10-CM | POA: Diagnosis not present

## 2022-03-09 DIAGNOSIS — E782 Mixed hyperlipidemia: Secondary | ICD-10-CM | POA: Diagnosis not present

## 2022-03-09 NOTE — Patient Instructions (Addendum)
Medication Instructions:  Your physician recommends that you continue on your current medications as directed. Please refer to the Current Medication list given to you today.  *If you need a refill on your cardiac medications before your next appointment, please call your pharmacy*   Follow-Up: At [facility], you and your health needs are our priority.  As part of our continuing mission to provide you with exceptional heart care, we have created designated Provider Care Teams.  These Care Teams include your primary Cardiologist (physician) and Advanced Practice Providers (APPs -  Physician Assistants and Nurse Practitioners) who all work together to provide you with the care you need, when you need it.  We recommend signing up for the patient portal called "MyChart".  Sign up information is provided on this After Visit Summary.  MyChart is used to connect with patients for Virtual Visits (Telemedicine).  Patients are able to view lab/test results, encounter notes, upcoming appointments, etc.  Non-urgent messages can be sent to your provider as well.   To learn more about what you can do with MyChart, go to [email or web address].    Your next appointment:   6 month(s)  The format for your next appointment:   In Person  Provider:   Fabian Sharp, PA-C, Sande Rives, PA-C, Jory Sims, DNP, ANP, Almyra Deforest, PA-C, or Diona Browner, NP       Then, Quay Burow, MD will plan to see you again in 12 month(s).    Other Instructions Dr. Gwenlyn Found has requested that you schedule an appointment with one of our clinical pharmacists for a blood pressure check appointment within the next 3-4 weeks.  If you monitor your blood pressure (BP) at home, please bring your BP cuff and your BP readings with you to this appointment  HOW TO TAKE YOUR BLOOD PRESSURE: Rest 5 minutes before taking your blood pressure. Don't smoke or drink caffeinated beverages for at least 30 minutes before. Take your  blood pressure before (not after) you eat. Sit comfortably with your back supported and both feet on the floor (don't cross your legs). Elevate your arm to heart level on a table or a desk. Use the proper sized cuff. It should fit smoothly and snugly around your bare upper arm. There should be enough room to slip a fingertip under the cuff. The bottom edge of the cuff should be 1 inch above the crease of the elbow. Ideally, take 3 measurements at one sitting and record the average.

## 2022-03-09 NOTE — Progress Notes (Signed)
03/09/2022 [name]   Nov 23, 1945  [id number]  Primary Physician Seward Carol, MD Primary Cardiologist: Lorretta Harp MD Garret Reddish, [place], [place]  HPI:  Ariel Cobb is a 76 y.o.    thin appearing widowed Caucasian female no children and who is accompanied by her Sister Darien Ramus who is also a patient of mine.  She now lives with her sister and nephew.  I last saw her in the office  01/12/2021. Marland KitchenShe was referred by Dr. Ila Mcgill at [facility] for evaluation and treatment of claudication. Her primary care physician is Dr. Alta Corning at [facility]. Her cardiovascular risk factors include a 50-pack-year history tobacco abuse currently smoking one pack per day, 2 hypertension, diabetes and hyperlipidemia. Her father died suddenly of microinfarction at age 51. She has never had a heart attack or stroke patient is compared to sleep denies chest pain. She complains of right calf claudication with recent arterial Dopplers performed in our office on 04/25/13 revealing a right ABI 0.93 with a high-frequency signal in the right popliteal artery. She underwent angiography on 12/9 revealing a high-grade right popliteal artery stenosis with three-vessel runoff. Because of moderate renal deficiency her intervention was staged until today.I performed angiography and intervention on her 2 /2/15. She had a 99% calcified lesion in the right popliteal artery but I performed diamondback orbital rotational atherectomy on. Her symptoms of claudication resolved after her procedure and her Dopplers normalized. Her most recent Dopplers performed 02/05/14 revealed ABIs of 1 bilaterally with a widely patent right popliteal artery.   Since I saw her in the office almost 2 years ago she is done well.  She still denies claudication.  Her recent lower extremity arterial Doppler studies performed in July of last year revealed normal ABIs with moderate increased velocities in the distal right SFA.  She was  complaining of some tachypalpitations and had a monitor performed in our office 09/20/2018 that showed episodes of PSVT, nonsustained ventricular tachycardia and PAF.  She did have blood work performed by her PCP that showed a TSH 2.03 suggesting that she was hyperthyroid.  Her thyroid replacement therapy was down titrated.  Since that time she no longer is experiencing palpitations.   When I saw her 3 months ago she was in A. fib with a ventricular spots of 78.  We talked about starting apixaban.  It turns out that she was hyperthyroid and her Synthroid dose was adjusted.  She was also complaining of jaw and chest pain and has since had a negative Myoview and 2D echo.   She wore  a monitor which resulted on 04/15/2019 which showed episodes of PAF with RVR, SVT and nonsustained ventricular tachycardia.  Based on this, I decided  to put her back on her Eliquis oral anticoagulation, I discontinue her aspirin..  She is on Synthroid replacement and is followed by Dr. Buddy Duty, her endocrinologist.   Since I saw her in the office a year ago she continues to do well.  She still smoking close to a pack a day recalcitrant to factor modification.  She denies chest pain but does complain of some shortness of breath.  She apparently broke her hip this past February and is slowly recuperating from surgical ORIF.     Current Meds  Medication Sig   albuterol (PROVENTIL HFA;VENTOLIN HFA) 108 (90 BASE) MCG/ACT inhaler Inhale into the lungs every 6 (six) hours as needed for wheezing or shortness of breath.   amLODipine (NORVASC)  10 MG tablet Take 1 tablet (10 mg total) by mouth daily.   apixaban (ELIQUIS) 2.5 MG TABS tablet Take 1 tablet (2.5 mg total) by mouth 2 (two) times daily.   atorvastatin (LIPITOR) 80 MG tablet Take 1 tablet (80 mg total) by mouth every morning.   budesonide-formoterol (SYMBICORT) 160-4.5 MCG/ACT inhaler Inhale 2 puffs into the lungs 2 (two) times daily.   Calcium Carb-Cholecalciferol 600-800  MG-UNIT TABS Take 1 tablet by mouth daily.    clobetasol (TEMOVATE) 0.05 % external solution 1 application 2 (two) times daily as needed (rash).   docusate sodium (COLACE) 100 MG capsule Take 2 capsules (200 mg total) by mouth 2 (two) times daily.   ferrous sulfate 325 (65 FE) MG tablet Take 325 mg by mouth 3 (three) times a week. Mon, Wed, Friday   insulin glargine (LANTUS) 100 UNIT/ML Solostar Pen Inject 8 Units into the skin daily at 2 PM.    levothyroxine (SYNTHROID) 88 MCG tablet Take 88 mcg by mouth daily before breakfast.   metoprolol succinate (TOPROL-XL) 25 MG 24 hr tablet TAKE 1 TABLET BY MOUTH EVERY DAY   NOVOLOG FLEXPEN 100 UNIT/ML FlexPen Inject 4-6 Units into the skin 3 (three) times daily. Inject 4 units in the morning, 5 units at lunch.   6 units in the evening  (Sliding scale)   ONE TOUCH ULTRA TEST test strip TEST THREE TIMES DAILY E11.22   oxyCODONE-acetaminophen (PERCOCET/ROXICET) 5-325 MG tablet Take 1 tablet by mouth every 4 (four) hours as needed for moderate pain or severe pain.   polyethylene glycol (MIRALAX / GLYCOLAX) 17 g packet Take 17 g by mouth daily.   sodium chloride (OCEAN) 0.65 % SOLN nasal spray Place 1 spray into both nostrils as needed for congestion.     Allergies  Allergen Reactions   Adhesive [Tape] Itching    Surgical Tape - "makes me itch and break me out" -- HAS TO USE PAPER TAPE   Amoxicillin Hives, Itching and Swelling   Latex Itching   Other Other (See Comments)   Pioglitazone Other (See Comments)    Social History   Socioeconomic History   Marital status: Widowed    Spouse name: Not on file   Number of children: Not on file   Years of education: Not on file   Highest education level: Not on file  Occupational History   Not on file  Tobacco Use   Smoking status: Every Day    Packs/day: 1.00    Years: 50.00    Total pack years: 50.00    Types: Cigarettes   Smokeless tobacco: Never  Substance and Sexual Activity   Alcohol use: No    Drug use: No   Sexual activity: Never  Other Topics Concern   Not on file  Social History Narrative   Not on file   Social Determinants of Health   Financial Resource Strain: Not on file  Food Insecurity: Not on file  Transportation Needs: Not on file  Physical Activity: Not on file  Stress: Not on file  Social Connections: Not on file  Intimate Partner Violence: Not on file     Review of Systems: General: negative for chills, fever, night sweats or weight changes.  Cardiovascular: negative for chest pain, dyspnea on exertion, edema, orthopnea, palpitations, paroxysmal nocturnal dyspnea or shortness of breath Dermatological: negative for rash Respiratory: negative for cough or wheezing Urologic: negative for hematuria Abdominal: negative for nausea, vomiting, diarrhea, bright red blood per rectum, melena, or hematemesis  Neurologic: negative for visual changes, syncope, or dizziness All other systems reviewed and are otherwise negative except as noted above.    Blood pressure (!) 172/58, pulse 60, height '5\' 5"'$  (1.651 m), weight 114 lb 6.4 oz (51.9 kg), SpO2 98 %.  General appearance: alert and no distress Neck: no adenopathy, no carotid bruit, no JVD, supple, symmetrical, trachea midline, and thyroid not enlarged, symmetric, no tenderness/mass/nodules Lungs: clear to auscultation bilaterally Heart: regular rate and rhythm, S1, S2 normal, no murmur, click, rub or gallop Extremities: extremities normal, atraumatic, no cyanosis or edema Pulses: 2+ and symmetric Skin: Skin color, texture, turgor normal. No rashes or lesions Neurologic: Grossly normal  EKG sinus rhythm at 60 with RSR prime in lead V1 and V2 consistent with RV conduction delay, PACs, small inferior Q waves.  I personally reviewed this EKG.  ASSESSMENT AND PLAN:   PAD (peripheral artery disease) (HCC) History of PAD status post right popliteal orbital atherectomy by myself 07/29/2013.  Her most recent Dopplers  performed 02/15/2022 revealed this to be widely patent.  Essential hypertension History of essential hypertension a blood pressure measured today at 172/58.  She is on amlodipine and metoprolol.  She has moderate renal insufficiency.  I have asked her to keep a blood pressure log for 3 weeks and will see a Pharm.D. back after that to review and make appropriate medication changes.  Mixed hyperlipidemia due to type 2 diabetes mellitus ([place]) History of hyperlipidemia on statin therapy with lipid profile performed 09/07/2021 revealing total cholesterol 154, LDL 77 and HDL 58.  Tobacco abuse History of ongoing tobacco abuse 1 pack/day having smoked for the last 60 years recalcitrant to resector modification.  Paroxysmal atrial fibrillation (HCC) History of PAF maintaining sinus rhythm on Eliquis oral anticoagulation.  She does complain of palpitations on occasion.     Lorretta Harp MD FACP,FACC,FAHA, [facility] 03/09/2022 11:42 AM

## 2022-03-09 NOTE — Assessment & Plan Note (Signed)
History of hyperlipidemia on statin therapy with lipid profile performed 09/07/2021 revealing total cholesterol 154, LDL 77 and HDL 58.

## 2022-03-09 NOTE — Assessment & Plan Note (Signed)
History of ongoing tobacco abuse 1 pack/day having smoked for the last 60 years recalcitrant to resector modification.

## 2022-03-09 NOTE — Assessment & Plan Note (Signed)
History of PAF maintaining sinus rhythm on Eliquis oral anticoagulation.  She does complain of palpitations on occasion.

## 2022-03-09 NOTE — Assessment & Plan Note (Signed)
History of PAD status post right popliteal orbital atherectomy by myself 07/29/2013.  Her most recent Dopplers performed 02/15/2022 revealed this to be widely patent.

## 2022-03-09 NOTE — Assessment & Plan Note (Signed)
History of essential hypertension a blood pressure measured today at 172/58.  She is on amlodipine and metoprolol.  She has moderate renal insufficiency.  I have asked her to keep a blood pressure log for 3 weeks and will see a Pharm.D. back after that to review and make appropriate medication changes.

## 2022-03-24 DIAGNOSIS — E1042 Type 1 diabetes mellitus with diabetic polyneuropathy: Secondary | ICD-10-CM | POA: Diagnosis not present

## 2022-03-24 DIAGNOSIS — N1832 Chronic kidney disease, stage 3b: Secondary | ICD-10-CM | POA: Diagnosis not present

## 2022-03-24 DIAGNOSIS — E669 Obesity, unspecified: Secondary | ICD-10-CM | POA: Diagnosis not present

## 2022-03-24 DIAGNOSIS — E1022 Type 1 diabetes mellitus with diabetic chronic kidney disease: Secondary | ICD-10-CM | POA: Diagnosis not present

## 2022-03-24 DIAGNOSIS — Z794 Long term (current) use of insulin: Secondary | ICD-10-CM | POA: Diagnosis not present

## 2022-03-24 DIAGNOSIS — Z720 Tobacco use: Secondary | ICD-10-CM | POA: Diagnosis not present

## 2022-04-05 ENCOUNTER — Ambulatory Visit
Payer: Medicare HMO | Attending: Cardiovascular Disease | Admitting: Pharmacist Clinician (PhC)/ Clinical Pharmacy Specialist

## 2022-04-05 ENCOUNTER — Encounter: Payer: Self-pay | Admitting: Pharmacist Clinician (PhC)/ Clinical Pharmacy Specialist

## 2022-04-05 DIAGNOSIS — I10 Essential (primary) hypertension: Secondary | ICD-10-CM

## 2022-04-05 MED ORDER — AMLODIPINE BESYLATE 5 MG PO TABS: 5.0000 mg | ORAL_TABLET | Freq: Every day | ORAL | 6 refills | Status: DC

## 2022-04-05 NOTE — Assessment & Plan Note (Signed)
Patient with essential hypertension not at goal.  Not sure when amlodipine was stopped, but will re-start it today at 5 mg daily.  She should continue with her other medications and regular home monitoring.  We will see her back in the office in 6 weeks for follow up.

## 2022-04-05 NOTE — Progress Notes (Unsigned)
05/17/2022 Ariel Cobb 1945/08/06 [id number]   HPI:  Ariel Cobb is a 76 y.o. female patient of Dr Gwenlyn Found, with a [place] below who presents today for hypertension clinic evaluation.   She has been followed by Dr. Gwenlyn Found for PAD, but at her last visit was noted to have a BP of 172/58.   He asked that she monitor home readings and return in one month for follow up.    Today she is in the office with her sister.  In reviewing her medication list I find that she is not taking the amlodipine, but is taking ramipril and hydrochlorothiazide, neither of which are on her med list.  She also recently had a dose increase of levothyroxine and started alendronate weekly.  She is unsure about the amlodipine, and by pill count (she takes 6 medications each am), it doesn't look as though she has been taking it.    Past Medical History: hyperlipidemia 3/23 LDL 77 on atorvastatin 80  PAD R ABI 0.93, diamondback atherectomy  resolved sx, most recent ABI at 1, with widely patent popliteal artery  DM2 9/23 A1c 6.5 on Lantus 8 u, novolog tid  Tobacco abuse 50 pack year hx, still smoking 1 ppd  PAF CHADS2-VASc = 6, on Eliquis    Blood Pressure Goal:  130/80  Current Medications: metoprolol succ 25 mg qd, hctz 25 mg qd, ramipril 20 mg qd  Social Hx: 1 ppd, no alcohol; coffee in am (about 1/2 cup, takes hours to drink); diet coke 6oz bid    Diet:  admits to being a very picky eater.  Most days has toast with PB for breakfast, ltoast with pimento cheese and peanuts for lunch; last night had; grapes; banana and potato chips (6)for dinner; peanut butter and occasional pinto beans are her only source of protein; very few vegetables  Exercise: none  Home BP readings:  home cuff reads just over 10 points lower systolic, diastolic w/in 5 points.     24 readings average 153/64 HR 65  (ranage 120-179/57-80)  Intolerances:  no cardiac medications  Labs: 2/23:  Na 135, K 3.9, Glu 158, BUN 44, SCr 1.56, GFR  34   Wt Readings from Last 3 Encounters:  03/09/22 114 lb 6.4 oz (51.9 kg)  08/06/21 128 lb 15.5 oz (58.5 kg)  01/12/21 118 lb (53.5 kg)   BP Readings from Last 3 Encounters:  04/05/22 (!) 157/69  03/09/22 (!) 172/58  08/13/21 (!) 128/55   Pulse Readings from Last 3 Encounters:  04/05/22 65  03/09/22 60  08/13/21 71    Current Outpatient Medications  Medication Sig Dispense Refill   albuterol (PROVENTIL HFA;VENTOLIN HFA) 108 (90 BASE) MCG/ACT inhaler Inhale into the lungs every 6 (six) hours as needed for wheezing or shortness of breath.     alendronate (FOSAMAX) 70 MG tablet Take 70 mg by mouth once a week. Take with a full glass of water on an empty stomach.     amLODipine (NORVASC) 5 MG tablet Take 1 tablet (5 mg total) by mouth daily. 30 tablet 6   apixaban (ELIQUIS) 2.5 MG TABS tablet Take 1 tablet (2.5 mg total) by mouth 2 (two) times daily. 180 tablet 1   atorvastatin (LIPITOR) 80 MG tablet Take 1 tablet (80 mg total) by mouth every morning. 100 tablet 3   budesonide-formoterol (SYMBICORT) 160-4.5 MCG/ACT inhaler Inhale 2 puffs into the lungs 2 (two) times daily.     Calcium Carb-Cholecalciferol 600-800 MG-UNIT  TABS Take 1 tablet by mouth daily.      ferrous sulfate 325 (65 FE) MG tablet Take 325 mg by mouth 3 (three) times a week. Mon, Wed, Friday     hydrochlorothiazide (HYDRODIURIL) 25 MG tablet Take 25 mg by mouth daily.     insulin glargine (LANTUS) 100 UNIT/ML Solostar Pen Inject 8 Units into the skin daily at 2 PM.      levothyroxine (SYNTHROID) 100 MCG tablet Take 100 mcg by mouth daily before breakfast.     metoprolol succinate (TOPROL-XL) 25 MG 24 hr tablet TAKE 1 TABLET BY MOUTH EVERY DAY 90 tablet 3   NOVOLOG FLEXPEN 100 UNIT/ML FlexPen Inject 4-6 Units into the skin 3 (three) times daily. Inject 4 units in the morning, 5 units at lunch.   6 units in the evening  (Sliding scale)  11   ONE TOUCH ULTRA TEST test strip TEST THREE TIMES DAILY E11.22  4   ramipril  (ALTACE) 10 MG capsule Take 20 mg by mouth daily.     No current facility-administered medications for this visit.    Allergies  Allergen Reactions   Adhesive [Tape] Itching    Surgical Tape - "makes me itch and break me out" -- HAS TO USE PAPER TAPE   Amoxicillin Hives, Itching and Swelling   Latex Itching   Other Other (See Comments)   Pioglitazone Other (See Comments)    Past Medical History:  Diagnosis Date   Anemia    slight told feb dr polite   Arthritis    Asthma    Chronic bronchitis ([place])    Chronic kidney disease    sees dr sanford yearly stage 3 stable   Claudication ([place])    Complication of anesthesia 2000   slow to awaken after hip surgery   Hyperlipidemia    Hypertension    Hypothyroidism    Osteoporosis    PAD (peripheral artery disease) ([place])    a. 07/29/13: s/p PV angiogram with successful diamondback orbital rotational atherectomy of the high-grade calcified R popliteal artery stenosis   Shortness of breath    on exertion   Thyroid disease    Type II diabetes mellitus ([place])     Blood pressure (!) 157/69, pulse 65.   Essential hypertension Patient with essential hypertension not at goal.  Not sure when amlodipine was stopped, but will re-start it today at 5 mg daily.  She should continue with her other medications and regular home monitoring.  We will see her back in the office in 6 weeks for follow up.     Tommy Medal PharmD CPP [place] [address] [place], [place] [address] [phone]

## 2022-04-05 NOTE — Patient Instructions (Signed)
Return for a a follow up appointment Tuesday November 21 at 10 am  Check your blood pressure at home daily (at least 30 minutes after a cigarette) and keep record of the readings.  Take your BP meds as follows:  Call Erasmo Downer at [phone] when you get home to let me know if you have been taking the amlodipine.   We can then decide what medication changes need to be made.  Bring all of your meds, your BP cuff and your record of home blood pressures to your next appointment.  Exercise as you're able, try to walk approximately 30 minutes per day.  Keep salt intake to a minimum, especially watch canned and prepared boxed foods.  Eat more fresh fruits and vegetables and fewer canned items.  Avoid eating in fast food restaurants.    HOW TO TAKE YOUR BLOOD PRESSURE: Rest 5 minutes before taking your blood pressure.  Don't smoke or drink caffeinated beverages for at least 30 minutes before. Take your blood pressure before (not after) you eat. Sit comfortably with your back supported and both feet on the floor (don't cross your legs). Elevate your arm to heart level on a table or a desk. Use the proper sized cuff. It should fit smoothly and snugly around your bare upper arm. There should be enough room to slip a fingertip under the cuff. The bottom edge of the cuff should be 1 inch above the crease of the elbow. Ideally, take 3 measurements at one sitting and record the average.

## 2022-04-11 ENCOUNTER — Other Ambulatory Visit: Payer: Self-pay | Admitting: Cardiovascular Disease

## 2022-04-11 DIAGNOSIS — I480 Paroxysmal atrial fibrillation: Secondary | ICD-10-CM

## 2022-04-13 DIAGNOSIS — D6869 Other thrombophilia: Secondary | ICD-10-CM | POA: Diagnosis not present

## 2022-04-13 DIAGNOSIS — N1832 Chronic kidney disease, stage 3b: Secondary | ICD-10-CM | POA: Diagnosis not present

## 2022-04-13 DIAGNOSIS — J449 Chronic obstructive pulmonary disease, unspecified: Secondary | ICD-10-CM | POA: Diagnosis not present

## 2022-04-13 DIAGNOSIS — M810 Age-related osteoporosis without current pathological fracture: Secondary | ICD-10-CM | POA: Diagnosis not present

## 2022-04-13 DIAGNOSIS — I700 Atherosclerosis of aorta: Secondary | ICD-10-CM | POA: Diagnosis not present

## 2022-04-13 DIAGNOSIS — Z794 Long term (current) use of insulin: Secondary | ICD-10-CM | POA: Diagnosis not present

## 2022-04-13 DIAGNOSIS — Z23 Encounter for immunization: Secondary | ICD-10-CM | POA: Diagnosis not present

## 2022-04-13 DIAGNOSIS — I10 Essential (primary) hypertension: Secondary | ICD-10-CM | POA: Diagnosis not present

## 2022-04-13 DIAGNOSIS — I480 Paroxysmal atrial fibrillation: Secondary | ICD-10-CM | POA: Diagnosis not present

## 2022-04-13 DIAGNOSIS — I739 Peripheral vascular disease, unspecified: Secondary | ICD-10-CM | POA: Diagnosis not present

## 2022-04-13 DIAGNOSIS — E039 Hypothyroidism, unspecified: Secondary | ICD-10-CM | POA: Diagnosis not present

## 2022-04-13 DIAGNOSIS — E1022 Type 1 diabetes mellitus with diabetic chronic kidney disease: Secondary | ICD-10-CM | POA: Diagnosis not present

## 2022-04-13 DIAGNOSIS — E7800 Pure hypercholesterolemia, unspecified: Secondary | ICD-10-CM | POA: Diagnosis not present

## 2022-05-17 ENCOUNTER — Ambulatory Visit: Payer: Medicare HMO

## 2022-05-18 ENCOUNTER — Ambulatory Visit: Payer: Medicare HMO | Attending: Cardiology | Admitting: Pharmacist Clinician (PhC)/ Clinical Pharmacy Specialist

## 2022-05-18 ENCOUNTER — Encounter: Payer: Self-pay | Admitting: Pharmacist Clinician (PhC)/ Clinical Pharmacy Specialist

## 2022-05-18 VITALS — BP 135/62 | HR 61

## 2022-05-18 DIAGNOSIS — I10 Essential (primary) hypertension: Secondary | ICD-10-CM | POA: Diagnosis not present

## 2022-05-18 NOTE — Progress Notes (Signed)
05/18/2022 Ariel Cobb [birth year] [id number]   HPI:  Ariel Cobb is a 76 y.o. female patient of Dr Gwenlyn Found, with a [place] below who presents today for hypertension clinic evaluation.   She has been followed by Dr. Gwenlyn Found for PAD, but at her last visit was noted to have a BP of 172/58.   He asked that she monitor home readings and return in one month for follow up.  I saw her last month, and it was determined that she had somehow stopped the amlodipine prescription.  We had her resume at 5 mg daily.  Today she is back for follow up.  Since re-starting the amlodipine her home BP readings have dropped considerably (see below).  She has not had any issues with compliance or side effects.    Past Medical History: hyperlipidemia 3/23 LDL 77 on atorvastatin 80  PAD R ABI 0.93, diamondback atherectomy  resolved sx, most recent ABI at 1, with widely patent popliteal artery  DM2 9/23 A1c 6.5 on Lantus 8 u, novolog tid  Tobacco abuse 50 pack year hx, still smoking 1 ppd  PAF CHADS2-VASc = 6, on Eliquis    Blood Pressure Goal:  130/80  Current Medications: metoprolol succ 25 mg qd, hctz 25 mg qd, ramipril 20 mg qd, amlodipine 5 mg qd  Social Hx: 1 ppd, no alcohol; coffee in am (about 1/2 cup, takes hours to drink); diet coke 6oz bid    Diet:  admits to being a very picky eater.  Most days has toast with PB for breakfast, ltoast with pimento cheese and peanuts for lunch; last night had; grapes; banana and potato chips (6)for dinner; peanut butter and occasional pinto beans are her only source of protein; very few vegetables  Exercise: none  Home BP readings:  home cuff reads just over 10 points lower systolic, diastolic w/in 5 points.    33 readings average 132/56 HR 63 (range 122-143/49-62)   24 readings average 153/64 HR 65  (range 120-179/57-80)  Intolerances:  no cardiac medications  Labs: 2/23:  Na 135, K 3.9, Glu 158, BUN 44, SCr 1.56, GFR 34   Wt Readings from Last 3  Encounters:  03/09/22 114 lb 6.4 oz (51.9 kg)  08/06/21 128 lb 15.5 oz (58.5 kg)  01/12/21 118 lb (53.5 kg)   BP Readings from Last 3 Encounters:  05/18/22 135/62  04/05/22 (!) 157/69  03/09/22 (!) 172/58   Pulse Readings from Last 3 Encounters:  05/18/22 61  04/05/22 65  03/09/22 60    Current Outpatient Medications  Medication Sig Dispense Refill   metoprolol succinate (TOPROL-XL) 25 MG 24 hr tablet TAKE 1 TABLET BY MOUTH EVERY DAY 90 tablet 3   albuterol (PROVENTIL HFA;VENTOLIN HFA) 108 (90 BASE) MCG/ACT inhaler Inhale into the lungs every 6 (six) hours as needed for wheezing or shortness of breath.     alendronate (FOSAMAX) 70 MG tablet Take 70 mg by mouth once a week. Take with a full glass of water on an empty stomach.     amLODipine (NORVASC) 5 MG tablet Take 1 tablet (5 mg total) by mouth daily. 30 tablet 6   apixaban (ELIQUIS) 2.5 MG TABS tablet Take 1 tablet (2.5 mg total) by mouth 2 (two) times daily. 180 tablet 1   atorvastatin (LIPITOR) 80 MG tablet Take 1 tablet (80 mg total) by mouth every morning. 100 tablet 3   budesonide-formoterol (SYMBICORT) 160-4.5 MCG/ACT inhaler Inhale 2 puffs into the lungs 2 (  two) times daily.     Calcium Carb-Cholecalciferol 600-800 MG-UNIT TABS Take 1 tablet by mouth daily.      ferrous sulfate 325 (65 FE) MG tablet Take 325 mg by mouth 3 (three) times a week. Mon, Wed, Friday     hydrochlorothiazide (HYDRODIURIL) 25 MG tablet Take 25 mg by mouth daily.     insulin glargine (LANTUS) 100 UNIT/ML Solostar Pen Inject 8 Units into the skin daily at 2 PM.      levothyroxine (SYNTHROID) 100 MCG tablet Take 100 mcg by mouth daily before breakfast.     NOVOLOG FLEXPEN 100 UNIT/ML FlexPen Inject 4-6 Units into the skin 3 (three) times daily. Inject 4 units in the morning, 5 units at lunch.   6 units in the evening  (Sliding scale)  11   ONE TOUCH ULTRA TEST test strip TEST THREE TIMES DAILY E11.22  4   ramipril (ALTACE) 10 MG capsule Take 20 mg by  mouth daily.     No current facility-administered medications for this visit.    Allergies  Allergen Reactions   Adhesive [Tape] Itching    Surgical Tape - "makes me itch and break me out" -- HAS TO USE PAPER TAPE   Amoxicillin Hives, Itching and Swelling   Latex Itching   Other Other (See Comments)   Pioglitazone Other (See Comments)    Past Medical History:  Diagnosis Date   Anemia    slight told feb dr polite   Arthritis    Asthma    Chronic bronchitis ([place])    Chronic kidney disease    sees dr sanford yearly stage 3 stable   Claudication ([place])    Complication of anesthesia 2000   slow to awaken after hip surgery   Hyperlipidemia    Hypertension    Hypothyroidism    Osteoporosis    PAD (peripheral artery disease) ([place])    a. 07/29/13: s/p PV angiogram with successful diamondback orbital rotational atherectomy of the high-grade calcified R popliteal artery stenosis   Shortness of breath    on exertion   Thyroid disease    Type II diabetes mellitus (HCC)     Blood pressure 135/62, pulse 61.   Essential hypertension Patient with essential hypertension, currently doing better on combination of ramipril, amlodipine, hctz and metoprolol.  Home reading average dropped 21 points systolic with addition of amlodipine.  Will have her continue with current medications and make no changes at this time.   She should continue to monitor home readings every few days and reach out to the office should they start trending upward again.     Tommy Medal PharmD CPP [facility] [address] [place], [place] [address] [phone]

## 2022-05-18 NOTE — Patient Instructions (Signed)
  Check your blood pressure at home 2-3 times each week and keep record of the readings.  Call the office should you have any concerns about readings going up.  Take your BP meds as follows:  Continue with your current medications.  Bring all of your meds, your BP cuff and your record of home blood pressures to your next appointment.  Exercise as you're able, try to walk approximately 30 minutes per day.  Keep salt intake to a minimum, especially watch canned and prepared boxed foods.  Eat more fresh fruits and vegetables and fewer canned items.  Avoid eating in fast food restaurants.    HOW TO TAKE YOUR BLOOD PRESSURE: Rest 5 minutes before taking your blood pressure.  Don't smoke or drink caffeinated beverages for at least 30 minutes before. Take your blood pressure before (not after) you eat. Sit comfortably with your back supported and both feet on the floor (don't cross your legs). Elevate your arm to heart level on a table or a desk. Use the proper sized cuff. It should fit smoothly and snugly around your bare upper arm. There should be enough room to slip a fingertip under the cuff. The bottom edge of the cuff should be 1 inch above the crease of the elbow. Ideally, take 3 measurements at one sitting and record the average.

## 2022-05-18 NOTE — Assessment & Plan Note (Signed)
Patient with essential hypertension, currently doing better on combination of ramipril, amlodipine, hctz and metoprolol.  Home reading average dropped 21 points systolic with addition of amlodipine.  Will have her continue with current medications and make no changes at this time.   She should continue to monitor home readings every few days and reach out to the office should they start trending upward again.

## 2022-05-31 ENCOUNTER — Other Ambulatory Visit: Payer: Self-pay | Admitting: Internal Medicine

## 2022-05-31 DIAGNOSIS — R600 Localized edema: Secondary | ICD-10-CM | POA: Diagnosis not present

## 2022-05-31 DIAGNOSIS — S72001A Fracture of unspecified part of neck of right femur, initial encounter for closed fracture: Secondary | ICD-10-CM

## 2022-05-31 DIAGNOSIS — E8770 Fluid overload, unspecified: Secondary | ICD-10-CM | POA: Diagnosis not present

## 2022-06-03 ENCOUNTER — Inpatient Hospital Stay: Admission: RE | Admit: 2022-06-03 | Payer: Medicare HMO | Source: Ambulatory Visit

## 2022-06-07 DIAGNOSIS — R600 Localized edema: Secondary | ICD-10-CM | POA: Diagnosis not present

## 2022-06-07 DIAGNOSIS — R269 Unspecified abnormalities of gait and mobility: Secondary | ICD-10-CM | POA: Diagnosis not present

## 2022-07-28 DIAGNOSIS — N183 Chronic kidney disease, stage 3 unspecified: Secondary | ICD-10-CM | POA: Diagnosis not present

## 2022-07-28 DIAGNOSIS — I129 Hypertensive chronic kidney disease with stage 1 through stage 4 chronic kidney disease, or unspecified chronic kidney disease: Secondary | ICD-10-CM | POA: Diagnosis not present

## 2022-07-28 DIAGNOSIS — E1122 Type 2 diabetes mellitus with diabetic chronic kidney disease: Secondary | ICD-10-CM | POA: Diagnosis not present

## 2022-08-15 DIAGNOSIS — R197 Diarrhea, unspecified: Secondary | ICD-10-CM | POA: Diagnosis not present

## 2022-08-16 DIAGNOSIS — R197 Diarrhea, unspecified: Secondary | ICD-10-CM | POA: Diagnosis not present

## 2022-09-06 DIAGNOSIS — M87851 Other osteonecrosis, right femur: Secondary | ICD-10-CM | POA: Diagnosis not present

## 2022-09-06 DIAGNOSIS — M25551 Pain in right hip: Secondary | ICD-10-CM | POA: Diagnosis not present

## 2022-09-06 DIAGNOSIS — S72141S Displaced intertrochanteric fracture of right femur, sequela: Secondary | ICD-10-CM | POA: Diagnosis not present

## 2022-09-07 ENCOUNTER — Telehealth: Payer: Self-pay | Admitting: *Deleted

## 2022-09-07 NOTE — Telephone Encounter (Signed)
   Pre-operative Risk Assessment    Patient Name: Ariel Cobb  DOB: Nov 24, 1945 MRN: [id number]      Request for Surgical Clearance    Procedure:   HARDWARE REMOVAL RIGHT HIP  Date of Surgery:  Clearance TBD                                 Surgeon:  DR. Rod Can Surgeon's Group or Practice Name:  [facility] Phone number:  [phone] ATTN: Onset Fax number:  [phone]   Type of Clearance Requested:   - Medical  - Pharmacy:  Hold Apixaban (Eliquis)     Type of Anesthesia:  Spinal   Additional requests/questions:    Jiles Prows   09/07/2022, 4:27 PM

## 2022-09-07 NOTE — Telephone Encounter (Signed)
Pharmacy please advise on holding Eliquis prior to hardware removal right hip scheduled for TBD. Thank you.

## 2022-09-08 ENCOUNTER — Telehealth: Payer: Self-pay | Admitting: *Deleted

## 2022-09-08 NOTE — Telephone Encounter (Signed)
Pt has been scheduled for tele pre op appt 09/12/22 @ 2:40. Med rec and consent are done.    Patient Consent for Virtual Visit        Ariel Cobb has provided verbal consent on 09/08/2022 for a virtual visit (video or telephone).   CONSENT FOR VIRTUAL VISIT FOR:  Ariel Cobb  By participating in this virtual visit I agree to the following:  I hereby voluntarily request, consent and authorize [facility] and its employed or contracted physicians, physician assistants, nurse practitioners or other licensed health care professionals (the Practitioner), to provide me with telemedicine health care services (the "Services") as deemed necessary by the treating Practitioner. I acknowledge and consent to receive the Services by the Practitioner via telemedicine. I understand that the telemedicine visit will involve communicating with the Practitioner through live audiovisual communication technology and the disclosure of certain medical information by electronic transmission. I acknowledge that I have been given the opportunity to request an in-person assessment or other available alternative prior to the telemedicine visit and am voluntarily participating in the telemedicine visit.  I understand that I have the right to withhold or withdraw my consent to the use of telemedicine in the course of my care at any time, without affecting my right to future care or treatment, and that the Practitioner or I may terminate the telemedicine visit at any time. I understand that I have the right to inspect all information obtained and/or recorded in the course of the telemedicine visit and may receive copies of available information for a reasonable fee.  I understand that some of the potential risks of receiving the Services via telemedicine include:  Delay or interruption in medical evaluation due to technological equipment failure or disruption; Information transmitted may not be sufficient (e.g.  poor resolution of images) to allow for appropriate medical decision making by the Practitioner; and/or  In rare instances, security protocols could fail, causing a breach of personal health information.  Furthermore, I acknowledge that it is my responsibility to provide information about my medical history, conditions and care that is complete and accurate to the best of my ability. I acknowledge that Practitioner's advice, recommendations, and/or decision may be based on factors not within their control, such as incomplete or inaccurate data provided by me or distortions of diagnostic images or specimens that may result from electronic transmissions. I understand that the practice of medicine is not an exact science and that Practitioner makes no warranties or guarantees regarding treatment outcomes. I acknowledge that a copy of this consent can be made available to me via my patient portal ([facility]), or I can request a printed copy by calling the office of [facility].    I understand that my insurance will be billed for this visit.   I have read or had this consent read to me. I understand the contents of this consent, which adequately explains the benefits and risks of the Services being provided via telemedicine.  I have been provided ample opportunity to ask questions regarding this consent and the Services and have had my questions answered to my satisfaction. I give my informed consent for the services to be provided through the use of telemedicine in my medical care

## 2022-09-08 NOTE — Telephone Encounter (Signed)
   Name: Ariel Cobb  DOB: 17-Feb-1946  MRN: [id number]  Primary Cardiologist: Quay Burow, MD   Preoperative team, please contact this patient and set up a phone call appointment for further preoperative risk assessment. Please obtain consent and complete medication review. Thank you for your help.  I confirm that guidance regarding antiplatelet and oral anticoagulation therapy has been completed and, if necessary, noted below.   Per office protocol, patient can hold Eliquis for 3 days prior to procedure   [name], NP 09/08/2022, 1:06 PM [place]

## 2022-09-08 NOTE — Telephone Encounter (Signed)
Patient with diagnosis of afib on Eliquis for anticoagulation.    Procedure: HARDWARE REMOVAL RIGHT HIP  Date of procedure: TBD   CHA2DS2-VASc Score = 6   This indicates a 9.7% annual risk of stroke. The patient's score is based upon: CHF History: 0 HTN History: 1 Diabetes History: 1 Stroke History: 0 Vascular Disease History: 1 Age Score: 2 Gender Score: 1      CrCl 33 ml/min  Per office protocol, patient can hold Eliquis for 3 days prior to procedure.    **This guidance is not considered finalized until pre-operative APP has relayed final recommendations.**

## 2022-09-08 NOTE — Telephone Encounter (Signed)
Pt has been scheduled for tele pre op appt 09/12/22 @ 2:40. Med rec and consent are done.

## 2022-09-12 ENCOUNTER — Ambulatory Visit: Payer: Medicare HMO | Attending: Internal Medicine | Admitting: Student

## 2022-09-12 DIAGNOSIS — Z01810 Encounter for preprocedural cardiovascular examination: Secondary | ICD-10-CM

## 2022-09-12 NOTE — Progress Notes (Signed)
Virtual Visit via Telephone Note   Because of Ariel Cobb's co-morbid illnesses, she is at least at moderate risk for complications without adequate follow up.  This format is felt to be most appropriate for this patient at this time.  The patient did not have access to video technology/had technical difficulties with video requiring transitioning to audio format only (telephone).  All issues noted in this document were discussed and addressed.  No physical exam could be performed with this format.  Please refer to the patient's chart for her consent to telehealth for [facility].  Evaluation Performed:  Preoperative cardiovascular risk assessment _____________   Date:  09/12/2022   Patient ID:  Ariel Cobb, DOB [birth year], MRN [id number] Patient Location:  Home Provider location:   Office  Primary Care Provider:  Seward Carol, MD Primary Cardiologist:  Quay Burow, MD  Chief Complaint / Patient Profile   77 y.o. y/o female with a h/o PAF, PAD/claudication, hypertension, hyperlipidemia, T2DM, tobacco abuse, COPD, hypothyroidism who is pending hardware removal right hip by Dr. Lyla Glassing and presents today for telephonic preoperative cardiovascular risk assessment.  History of Present Illness    Ariel Cobb is a 77 y.o. female who presents via audio/video conferencing for a telehealth visit today.  Pt was last seen in cardiology clinic on 03/09/2022 by Dr. Gwenlyn Found.  At that time VERONIA PIOTROWICZ was doing well.  The patient is now pending procedure as outlined above. Since her last visit, she is doing well from a cardiac standpoint. Patient denies shortness of breath or dyspnea on exertion. No chest pain, pressure, or tightness. Denies lower extremity edema, orthopnea, or PND. No palpitations. She tries to stay active despite her hip pain. She is able to walk outside on flat ground a short distance. She navigates up a ramp that has a shallow incline every Sunday, she  performs light and moderate household duties, and she is independent with hygiene and ADLs.   Past Medical History    Past Medical History:  Diagnosis Date   Anemia    slight told feb dr polite   Arthritis    Asthma    Chronic bronchitis ([place])    Chronic kidney disease    sees dr sanford yearly stage 3 stable   Claudication ([place])    Complication of anesthesia 2000   slow to awaken after hip surgery   Hyperlipidemia    Hypertension    Hypothyroidism    Osteoporosis    PAD (peripheral artery disease) ([place])    a. 07/29/13: s/p PV angiogram with successful diamondback orbital rotational atherectomy of the high-grade calcified R popliteal artery stenosis   Shortness of breath    on exertion   Thyroid disease    Type II diabetes mellitus ([place])    Past Surgical History:  Procedure Laterality Date   ABDOMINAL AORTAGRAM  06/04/2013   Procedure: ABDOMINAL [name];  Surgeon: Lorretta Harp, MD;  Location: [facility] CATH LAB;  Service: Cardiovascular;;   ATHERECTOMY Right 07/29/2013   popliteal/notes 07/29/2013   CHOLECYSTECTOMY  1980's?   COLONOSCOPY WITH PROPOFOL N/A 09/06/2016   Procedure: COLONOSCOPY WITH PROPOFOL;  Surgeon: Garlan Fair, MD;  Location: [facility] ENDOSCOPY;  Service: Endoscopy;  Laterality: N/A;   cyst from right hand     ESOPHAGOGASTRODUODENOSCOPY (EGD) WITH PROPOFOL N/A 09/06/2016   Procedure: ESOPHAGOGASTRODUODENOSCOPY (EGD) WITH PROPOFOL;  Surgeon: Garlan Fair, MD;  Location: [facility] ENDOSCOPY;  Service: Endoscopy;  Laterality: N/A;   HIP FRACTURE SURGERY Left  2000's   "I broke the ball; dr broke femur during OR; got a rod in there" 92/07/2013)   INTRAMEDULLARY (IM) NAIL INTERTROCHANTERIC Right 08/06/2021   Procedure: INTRAMEDULLARY (IM) NAIL INTERTROCHANTRIC;  Surgeon: Shona Needles, MD;  Location: [facility];  Service: Orthopedics;  Laterality: Right;   LOWER EXTREMITY ANGIOGRAM N/A 06/04/2013   Procedure: LOWER EXTREMITY ANGIOGRAM;  Surgeon: Lorretta Harp, MD;  Location: [facility]  CATH LAB;  Service: Cardiovascular;  Laterality: N/A;    Allergies  Allergies  Allergen Reactions   Adhesive [Tape] Itching    Surgical Tape - "makes me itch and break me out" -- HAS TO USE PAPER TAPE   Amoxicillin Hives, Itching and Swelling   Latex Itching   Other Other (See Comments)   Pioglitazone Other (See Comments)    Home Medications    Prior to Admission medications   Medication Sig Start Date End Date Taking? Authorizing Provider  albuterol (PROVENTIL HFA;VENTOLIN HFA) 108 (90 BASE) MCG/ACT inhaler Inhale into the lungs every 6 (six) hours as needed for wheezing or shortness of breath.    [provider]  alendronate (FOSAMAX) 70 MG tablet Take 70 mg by mouth once a week. Take with a full glass of water on an empty stomach.    [provider]  amLODipine (NORVASC) 5 MG tablet Take 1 tablet (5 mg total) by mouth daily. 04/05/22   Lorretta Harp, MD  apixaban (ELIQUIS) 2.5 MG TABS tablet Take 1 tablet (2.5 mg total) by mouth 2 (two) times daily. 02/24/22   Lorretta Harp, MD  atorvastatin (LIPITOR) 80 MG tablet Take 1 tablet (80 mg total) by mouth every morning. 09/28/21   Lorretta Harp, MD  budesonide-formoterol [facility]) 160-4.5 MCG/ACT inhaler Inhale 2 puffs into the lungs 2 (two) times daily.    [provider]  Calcium Carb-Cholecalciferol 600-800 MG-UNIT TABS Take 1 tablet by mouth daily.     [provider]  ferrous sulfate 325 (65 FE) MG tablet Take 325 mg by mouth 3 (three) times a week. Mon, Wed, Friday    [provider]  hydrochlorothiazide (HYDRODIURIL) 25 MG tablet Take 25 mg by mouth daily.    [provider]  insulin glargine (LANTUS) 100 UNIT/ML Solostar Pen Inject 8 Units into the skin daily at 2 PM.     [provider]  levothyroxine (SYNTHROID) 100 MCG tablet Take 100 mcg by mouth daily before breakfast.    [provider]  metoprolol succinate (TOPROL-XL) 25 MG 24 hr tablet TAKE  1 TABLET BY MOUTH EVERY DAY 04/11/22   Lorretta Harp, MD  NOVOLOG FLEXPEN 100 UNIT/ML FlexPen Inject 4-6 Units into the skin 3 (three) times daily. Inject 4 units in the morning, 5 units at lunch.   6 units in the evening  (Sliding scale) 12/23/14   [provider]  ONE TOUCH ULTRA TEST test strip TEST THREE TIMES DAILY E11.22 10/06/17   [provider]  ramipril (ALTACE) 10 MG capsule Take 20 mg by mouth daily.    [provider]    Physical Exam    Vital Signs:  NAFISA LEMMER does not have vital signs available for review today.  Given telephonic nature of communication, physical exam is limited. AAOx3. NAD. Normal affect.  Speech and respirations are unlabored.  Accessory Clinical Findings    None  Assessment & Plan    Primary Cardiologist: Quay Burow, MD  Preoperative cardiovascular risk assessment.  Hardware removal right hip by  Dr. Lyla Glassing.  Chart reviewed as part of pre-operative protocol coverage. According to the RCRI, patient has a 0.9% risk of MACE. Patient reports activity equivalent to 5.07 METS (per DASI).   Given past medical history and time since last visit, based on ACC/AHA guidelines, AMALYA FRANCKE would be at acceptable risk for the planned procedure without further cardiovascular testing.   Patient was advised that if she develops new symptoms prior to surgery to contact our office to arrange a follow-up appointment.  she verbalized understanding.  2. Anti-coag.  Per Pharm.D.: Patient with diagnosis of afib on Eliquis for anticoagulation.     Procedure: HARDWARE REMOVAL RIGHT HIP  Date of procedure: TBD     CHA2DS2-VASc Score = 6   This indicates a 9.7% annual risk of stroke. The patient's score is based upon: CHF History: 0 HTN History: 1 Diabetes History: 1 Stroke History: 0 Vascular Disease History: 1 Age Score: 2 Gender Score: 1     CrCl 33 ml/min   Per office protocol, patient can hold Eliquis for 3  days prior to procedure.  I will route this recommendation to the requesting party via Epic fax function.  Please call with questions.  Time:   Today, I have spent 8 minutes with the patient with telehealth technology discussing medical history, symptoms, and management plan.     Mayra Reel, NP  09/12/2022, 7:59 AM

## 2022-09-13 ENCOUNTER — Ambulatory Visit: Payer: Medicare HMO | Admitting: Podiatry

## 2022-09-13 ENCOUNTER — Encounter: Payer: Self-pay | Admitting: Podiatry

## 2022-09-13 DIAGNOSIS — Z794 Long term (current) use of insulin: Secondary | ICD-10-CM | POA: Diagnosis not present

## 2022-09-13 DIAGNOSIS — M79675 Pain in left toe(s): Secondary | ICD-10-CM | POA: Diagnosis not present

## 2022-09-13 DIAGNOSIS — D689 Coagulation defect, unspecified: Secondary | ICD-10-CM

## 2022-09-13 DIAGNOSIS — I739 Peripheral vascular disease, unspecified: Secondary | ICD-10-CM

## 2022-09-13 DIAGNOSIS — E039 Hypothyroidism, unspecified: Secondary | ICD-10-CM | POA: Diagnosis not present

## 2022-09-13 DIAGNOSIS — N1832 Chronic kidney disease, stage 3b: Secondary | ICD-10-CM | POA: Diagnosis not present

## 2022-09-13 DIAGNOSIS — I480 Paroxysmal atrial fibrillation: Secondary | ICD-10-CM | POA: Diagnosis not present

## 2022-09-13 DIAGNOSIS — B351 Tinea unguium: Secondary | ICD-10-CM

## 2022-09-13 DIAGNOSIS — L608 Other nail disorders: Secondary | ICD-10-CM

## 2022-09-13 DIAGNOSIS — M79674 Pain in right toe(s): Secondary | ICD-10-CM

## 2022-09-13 DIAGNOSIS — I10 Essential (primary) hypertension: Secondary | ICD-10-CM | POA: Diagnosis not present

## 2022-09-13 DIAGNOSIS — E7800 Pure hypercholesterolemia, unspecified: Secondary | ICD-10-CM | POA: Diagnosis not present

## 2022-09-13 NOTE — Progress Notes (Signed)
  Subjective:  Patient ID: Ariel Cobb, female    DOB: July 29, 1945,   MRN: [id number]  No chief complaint on file.   77 y.o. female presents for concern of thickened elongated and painful nails that are difficult to trim. Requesting to have them trimmed today. She is on a blood thinner and at risk for rfc.   PCP:  Seward Carol, MD    . Denies any other pedal complaints. Denies n/v/f/c.   Past Medical History:  Diagnosis Date   Anemia    slight told feb dr polite   Arthritis    Asthma    Chronic bronchitis ([place])    Chronic kidney disease    sees dr sanford yearly stage 3 stable   Claudication ([place])    Complication of anesthesia 2000   slow to awaken after hip surgery   Hyperlipidemia    Hypertension    Hypothyroidism    Osteoporosis    PAD (peripheral artery disease) ([place])    a. 07/29/13: s/p PV angiogram with successful diamondback orbital rotational atherectomy of the high-grade calcified R popliteal artery stenosis   Shortness of breath    on exertion   Thyroid disease    Type II diabetes mellitus (HCC)     Objective:  Physical Exam: Vascular: DP/PT pulses 2/4 bilateral. CFT <3 seconds. Absent hair growth on digits. Edema noted to bilateral lower extremities. Xerosis noted bilaterally.  Skin. No lacerations or abrasions bilateral feet. Nails 1-5 bilateral  are thickened discolored and elongated with subungual debris.  Musculoskeletal: MMT 5/5 bilateral lower extremities in DF, PF, Inversion and Eversion. Deceased ROM in DF of ankle joint.  Neurological: Sensation intact to light touch. Protective sensation intact bilateral.    Assessment:   1. Pain due to onychomycosis of toenails of both feet   2. Blood clotting disorder ([place])   3. Pincer nail deformity   4. PAD (peripheral artery disease) ([place])      Plan:  Patient was evaluated and treated and all questions answered. -Discussed supportive shoes at all times and checking feet regularly.  -Mechanically  debrided all nails 1-5 bilateral using sterile nail nipper and filed with dremel without incident  -Answered all patient questions -Patient to return  in 3 months for at risk foot care -Patient advised to call the office if any problems or questions arise in the meantime.   Lorenda Peck, DPM

## 2022-09-14 ENCOUNTER — Ambulatory Visit
Admission: RE | Admit: 2022-09-14 | Discharge: 2022-09-14 | Disposition: A | Discharge status: Home / Self Care | Payer: Medicare HMO | Source: Ambulatory Visit | Attending: Internal Medicine | Admitting: Internal Medicine

## 2022-09-14 DIAGNOSIS — Z780 Asymptomatic menopausal state: Secondary | ICD-10-CM | POA: Diagnosis not present

## 2022-09-14 DIAGNOSIS — S72001A Fracture of unspecified part of neck of right femur, initial encounter for closed fracture: Secondary | ICD-10-CM

## 2022-09-14 DIAGNOSIS — M810 Age-related osteoporosis without current pathological fracture: Secondary | ICD-10-CM | POA: Diagnosis not present

## 2022-09-21 DIAGNOSIS — N1832 Chronic kidney disease, stage 3b: Secondary | ICD-10-CM | POA: Diagnosis not present

## 2022-09-21 DIAGNOSIS — Z720 Tobacco use: Secondary | ICD-10-CM | POA: Diagnosis not present

## 2022-09-21 DIAGNOSIS — E1022 Type 1 diabetes mellitus with diabetic chronic kidney disease: Secondary | ICD-10-CM | POA: Diagnosis not present

## 2022-09-21 DIAGNOSIS — E1042 Type 1 diabetes mellitus with diabetic polyneuropathy: Secondary | ICD-10-CM | POA: Diagnosis not present

## 2022-09-21 DIAGNOSIS — E669 Obesity, unspecified: Secondary | ICD-10-CM | POA: Diagnosis not present

## 2022-09-27 ENCOUNTER — Other Ambulatory Visit: Payer: Self-pay

## 2022-09-27 MED ORDER — ATORVASTATIN CALCIUM 80 MG PO TABS: 80.0000 mg | ORAL_TABLET | ORAL | 0 refills | Status: DC

## 2022-10-12 DIAGNOSIS — L218 Other seborrheic dermatitis: Secondary | ICD-10-CM | POA: Diagnosis not present

## 2022-10-13 ENCOUNTER — Other Ambulatory Visit: Payer: Self-pay | Admitting: Cardiovascular Disease

## 2022-10-13 NOTE — Telephone Encounter (Signed)
Prior SCr from Dec 2023 was > 1.5 which is historically where her SCr is. Would leave her dose as is for now, if on her next lab work her SCr remains < 1.5, would change her dose then.

## 2022-10-13 NOTE — Telephone Encounter (Signed)
Prescription refill request for Eliquis received. Indication: afib  Last office visit: 03/09/2022, [name] Scr: 1.3, 08/05/2022 Age: 78 yo   Weight: 51.9 kg   Per dosing criteria pt qualifies for a dose increase. Please advise.

## 2022-10-13 NOTE — Telephone Encounter (Signed)
Refill sent.

## 2022-10-14 ENCOUNTER — Other Ambulatory Visit: Payer: Self-pay | Admitting: Cardiovascular Disease

## 2022-10-17 DIAGNOSIS — R195 Other fecal abnormalities: Secondary | ICD-10-CM | POA: Diagnosis not present

## 2022-10-17 DIAGNOSIS — Z8719 Personal history of other diseases of the digestive system: Secondary | ICD-10-CM | POA: Diagnosis not present

## 2022-10-17 DIAGNOSIS — Z8679 Personal history of other diseases of the circulatory system: Secondary | ICD-10-CM | POA: Diagnosis not present

## 2022-10-17 DIAGNOSIS — R197 Diarrhea, unspecified: Secondary | ICD-10-CM | POA: Diagnosis not present

## 2022-10-25 NOTE — Progress Notes (Signed)
Surgery orders requested via Epic inbox. °

## 2022-10-26 ENCOUNTER — Ambulatory Visit: Payer: Self-pay | Admitting: Student

## 2022-10-27 ENCOUNTER — Ambulatory Visit: Payer: Self-pay | Admitting: Student

## 2022-10-27 DIAGNOSIS — N1832 Chronic kidney disease, stage 3b: Secondary | ICD-10-CM

## 2022-10-27 DIAGNOSIS — E1122 Type 2 diabetes mellitus with diabetic chronic kidney disease: Secondary | ICD-10-CM

## 2022-10-28 NOTE — Patient Instructions (Addendum)
SURGICAL WAITING ROOM VISITATION  Patients having surgery or a procedure may have no more than 2 support people in the waiting area - these visitors may rotate.    Children under the age of 56 must have an adult with them who is not the patient.  Due to an increase in RSV and influenza rates and associated hospitalizations, children ages 42 and under may not visit patients in [facility] hospitals.  If the patient needs to stay at the hospital during part of their recovery, the visitor guidelines for inpatient rooms apply. Pre-op nurse will coordinate an appropriate time for 1 support person to accompany patient in pre-op.  This support person may not rotate.    Please refer to the [facility] website for the visitor guidelines for Inpatients (after your surgery is over and you are in a regular room).       Your procedure is scheduled on:   11/10/2022    Report to [facility] Main Entrance    Report to admitting at  0515 AM   Call this number if you have problems the morning of surgery [phone]   Do not eat food :After Midnight.   After Midnight you may have the following liquids until __0430____ AM  DAY OF SURGERY  Water Non-Citrus Juices (without pulp, NO RED-Apple, White grape, White cranberry) Black Coffee (NO MILK/CREAM OR CREAMERS, sugar ok)  Clear Tea (NO MILK/CREAM OR CREAMERS, sugar ok) regular and decaf                             Plain Jell-O (NO RED)                                           Fruit ices (not with fruit pulp, NO RED)                                     Popsicles (NO RED)                                                               Sports drinks like Gatorade (NO RED)                      The day of surgery:  Drink ONE (1) Pre-Surgery Clear Ensure or G2 at 0430 AM ( have completed by )  the morning of surgery. Drink in one sitting. Do not sip.  This drink was given to you during your hospital      pre-op appointment visit. Nothing else  to drink after completing the  Pre-Surgery Clear Ensure or G2.          If you have questions, please contact your surgeon's office.   FOLLOW BOWEL PREP AND ANY ADDITIONAL PRE OP INSTRUCTIONS YOU RECEIVED FROM YOUR SURGEON'S OFFICE!!!     Oral Hygiene is also important to reduce your risk of infection.  Remember - BRUSH YOUR TEETH THE MORNING OF SURGERY WITH YOUR REGULAR TOOTHPASTE  DENTURES WILL BE REMOVED PRIOR TO SURGERY PLEASE DO NOT APPLY "Poly grip" OR ADHESIVES!!!   Do NOT smoke after Midnight   Take these medicines the morning of surgery with A SIP OF WATER:  inhalers as usual and bring, amlodipine, synthroid, toprol           Lantus-          Novolog-   DO NOT TAKE ANY ORAL DIABETIC MEDICATIONS DAY OF YOUR SURGERY  Bring CPAP mask and tubing day of surgery.                              You may not have any metal on your body including hair pins, jewelry, and body piercing             Do not wear make-up, lotions, powders, perfumes/cologne, or deodorant  Do not wear nail polish including gel and S&S, artificial/acrylic nails, or any other type of covering on natural nails including finger and toenails. If you have artificial nails, gel coating, etc. that needs to be removed by a nail salon please have this removed prior to surgery or surgery may need to be canceled/ delayed if the surgeon/ anesthesia feels like they are unable to be safely monitored.   Do not shave  48 hours prior to surgery.               Men may shave face and neck.   Do not bring valuables to the hospital. [facility] IS NOT             RESPONSIBLE   FOR VALUABLES.   Contacts, glasses, dentures or bridgework may not be worn into surgery.   Bring small overnight bag day of surgery.   DO NOT BRING YOUR HOME MEDICATIONS TO THE HOSPITAL. PHARMACY WILL DISPENSE MEDICATIONS LISTED ON YOUR MEDICATION LIST TO YOU DURING YOUR ADMISSION IN THE HOSPITAL!    Patients  discharged on the day of surgery will not be allowed to drive home.  Someone NEEDS to stay with you for the first 24 hours after anesthesia.   Special Instructions: Bring a copy of your healthcare power of attorney and living will documents the day of surgery if you haven't scanned them before.              Please read over the following fact sheets you were given: IF YOU HAVE QUESTIONS ABOUT YOUR PRE-OP INSTRUCTIONS PLEASE CALL [phone]   If you received a COVID test during your pre-op visit  it is requested that you wear a mask when out in public, stay away from anyone that may not be feeling well and notify your surgeon if you develop symptoms. If you test positive for Covid or have been in contact with anyone that has tested positive in the last 10 days please notify you surgeon.

## 2022-10-28 NOTE — Progress Notes (Addendum)
Anesthesia Review:  PCP:  DR Renford Dills 09/13/22 OV on chart - preop eval  PT to see PCP on 11/03/22 per sister.   Cardiologist : DR Jeri Cos  LOV  03/09/22  [name] 09/12/22 - on chart telephone appt   Endocrinologist- DR Sharl Ma-  Chest x-ray : EKG : 03/09/22  Echo :2020 Stress test:2020 Cardiac Cath :  Activity level: can do a flight of stairs without difficutly  Sleep Study/ CPAP : none  Fasting Blood Sugar :      / Checks Blood Sugar -- times a day:   Blood Thinner/ Instructions /Last Dose: ASA / Instructions/ Last Dose :  Eliquis - Stop 3 days prior per pt    DM- type 1- checks glucose 4x daily ( pt states at preop she is type 1 other MD state type 2- have requested OV note from DR Sharl Ma of 08/2022.  They are to fax .  To confirm she is type 2 or tpe 1.  REceived and placed on chart LOV note from DR Sharl Ma of 09/21/22 stating she is a type 1 diabetic.   Called pt back along with sister who is also on phone and informed them that per DR Sharl Ma note pt is a type 1 diabetic.   Hgba1c- 11/01/22- 6.0  Basaglar Insulin- Take 80% of dose since type 1 - which is 6 units pm before surgery. Informed sister and pt on 11/01/22 with phone call after received clarificaiton from ov note of DR Sharl Ma that pt is a type 1 diabetic.   Novolog 3 x daily with meals- take usual doses day before surgery and nond day of surgery.    Sister with pt at preop appt.  PT able to answer all questions.   CBC done 11/01/22 routed to Dr Linna Caprice- White count- 12.4.  BMP done 11/01/22 routed to Dr Linna Caprice.     Sister - [name] called on 11/04/2022 to state that pt had been to dermatologist on 11/02/2022.  PT diagnosed with impetigo per sister and placed on doxycycline 100mg  x 14 days and mupriocin ointment x 14 days.  Sister was instructed by preop nurse to notify Dr Linna Caprice office today.  Sister voiced understanding.

## 2022-11-01 ENCOUNTER — Encounter (HOSPITAL_COMMUNITY)
Admission: RE | Admit: 2022-11-01 | Discharge: 2022-11-01 | Disposition: A | Discharge status: Home / Self Care | Payer: Medicare HMO | Source: Ambulatory Visit | Attending: Orthopedic Surgery | Admitting: Orthopedic Surgery

## 2022-11-01 ENCOUNTER — Other Ambulatory Visit: Payer: Self-pay

## 2022-11-01 ENCOUNTER — Encounter (HOSPITAL_COMMUNITY): Payer: Self-pay

## 2022-11-01 VITALS — BP 140/52 | HR 65 | Temp 98.2°F | Resp 16 | Ht 65.0 in | Wt 108.0 lb

## 2022-11-01 DIAGNOSIS — N1832 Chronic kidney disease, stage 3b: Secondary | ICD-10-CM | POA: Insufficient documentation

## 2022-11-01 DIAGNOSIS — J449 Chronic obstructive pulmonary disease, unspecified: Secondary | ICD-10-CM | POA: Insufficient documentation

## 2022-11-01 DIAGNOSIS — Z96698 Presence of other orthopedic joint implants: Secondary | ICD-10-CM | POA: Diagnosis not present

## 2022-11-01 DIAGNOSIS — M87051 Idiopathic aseptic necrosis of right femur: Secondary | ICD-10-CM | POA: Diagnosis not present

## 2022-11-01 DIAGNOSIS — E1122 Type 2 diabetes mellitus with diabetic chronic kidney disease: Secondary | ICD-10-CM | POA: Insufficient documentation

## 2022-11-01 DIAGNOSIS — I480 Paroxysmal atrial fibrillation: Secondary | ICD-10-CM | POA: Insufficient documentation

## 2022-11-01 DIAGNOSIS — I129 Hypertensive chronic kidney disease with stage 1 through stage 4 chronic kidney disease, or unspecified chronic kidney disease: Secondary | ICD-10-CM | POA: Insufficient documentation

## 2022-11-01 DIAGNOSIS — Z01818 Encounter for other preprocedural examination: Secondary | ICD-10-CM

## 2022-11-01 DIAGNOSIS — Z01812 Encounter for preprocedural laboratory examination: Secondary | ICD-10-CM | POA: Diagnosis not present

## 2022-11-01 DIAGNOSIS — Z794 Long term (current) use of insulin: Secondary | ICD-10-CM | POA: Insufficient documentation

## 2022-11-01 DIAGNOSIS — F17210 Nicotine dependence, cigarettes, uncomplicated: Secondary | ICD-10-CM | POA: Insufficient documentation

## 2022-11-01 HISTORY — DX: Chronic obstructive pulmonary disease, unspecified: J44.9

## 2022-11-01 HISTORY — DX: Unspecified atrial fibrillation: I48.91

## 2022-11-01 LAB — BASIC METABOLIC PANEL
Anion gap: 9 (ref 5–15)
BUN: 37 mg/dL — ABNORMAL HIGH (ref 8–23)
CO2: 27 mmol/L (ref 22–32)
Calcium: 9 mg/dL (ref 8.9–10.3)
Chloride: 103 mmol/L (ref 98–111)
Creatinine, Ser: 1.61 mg/dL — ABNORMAL HIGH (ref 0.44–1.00)
GFR, Estimated: 33 mL/min — ABNORMAL LOW (ref >60)
Glucose, Bld: 149 mg/dL — ABNORMAL HIGH (ref 70–99)
Potassium: 3.8 mmol/L (ref 3.5–5.1)
Sodium: 139 mmol/L (ref 135–145)

## 2022-11-01 LAB — CBC
HCT: 36.1 % (ref 36.0–46.0)
Hemoglobin: 11.7 g/dL — ABNORMAL LOW (ref 12.0–15.0)
MCH: 32 pg (ref 26.0–34.0)
MCHC: 32.4 g/dL (ref 30.0–36.0)
MCV: 98.6 fL (ref 80.0–100.0)
Platelets: 380 10*3/uL (ref 150–400)
RBC: 3.66 MIL/uL — ABNORMAL LOW (ref 3.87–5.11)
RDW: 13.5 % (ref 11.5–15.5)
WBC: 12.1 10*3/uL — ABNORMAL HIGH (ref 4.0–10.5)
nRBC: 0 % (ref 0.0–0.2)

## 2022-11-01 LAB — HEMOGLOBIN A1C
Hgb A1c MFr Bld: 6 % — ABNORMAL HIGH (ref 4.8–5.6)
Mean Plasma Glucose: 125.5 mg/dL

## 2022-11-01 LAB — GLUCOSE, CAPILLARY: Glucose-Capillary: 132 mg/dL — ABNORMAL HIGH (ref 70–99)

## 2022-11-02 ENCOUNTER — Encounter (HOSPITAL_COMMUNITY): Payer: Self-pay | Admitting: Medical

## 2022-11-02 DIAGNOSIS — L0100 Impetigo, unspecified: Secondary | ICD-10-CM | POA: Diagnosis not present

## 2022-11-02 NOTE — Progress Notes (Signed)
Case: [id number] Date/Time: 11/10/22 0715   Procedure: HARDWARE REMOVAL HIP (Right) - 120   Anesthesia type: Spinal   Pre-op diagnosis: Avascular necrosis right hip, retained hardare   [facility] ROOM 08 / [facility]   Surgeons: Samson Frederic, MD       DISCUSSION: Ariel Cobb is a 77 year old female who presents to PAT clinic prior to surgery listed above.  Patient was admitted in February 2023 due to a right hip fracture. 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 She will now undergo a staged procedure, first with removal of hardware, followed by THA in 6 weeks.  Other PMH includes current tobacco abuse, COPD, PAD, A fib on Eliquis, Type 1 DM, CKD, thyroid disease  Patient last saw her cardiologist on 03/09/2022.  Was noted to be grossly stable other than elevated blood pressure at that visit.  Her blood pressure medicines were adjusted and on follow-up blood pressure was under much better control. Preoperative CV risk assessment provided on 09/12/22:  "Chart reviewed as part of pre-operative protocol coverage. According to the RCRI, patient has a 0.9% risk of MACE. Patient reports activity equivalent to 5.07 METS (per DASI).    Given past medical history and time since last visit, based on ACC/AHA guidelines, Ariel Cobb would be at acceptable risk for the planned procedure without further cardiovascular testing."  She will hold her Eliquis for 72 hours per Pharmacy.  Patient also follows closely with Endocrine for her Diabetes. Her HgA1c was 6.0 on 11/01/22  Kidney function noted to be abnormal but at baseline.  PCP clearance provided with physical copy in chart:   "Patient has significant right hip pain, requiring surgery to have the prosthesis removed.  She has been cleared from a cardiovascular standpoint.  There are no medical contraindications to proceeding with surgery.  Most recently had labs which have been  stable."   VS: BP (!) 140/52   Pulse 65   Temp 36.8 C (Oral)   Resp 16   Ht 5\' 5"  (1.651 m)   Wt 49 kg   SpO2 100%   BMI 17.97 kg/m   PROVIDERS: Renford Dills, MD. 09/13/22 OV on chart - preop eval  PT to see PCP on 11/03/22 per sister.   Cardiologist : Jeri Cos, MD Endocrinologist- Talmage Coin, MD   LABS: Labs reviewed: Acceptable for surgery. (all labs ordered are listed, but only abnormal results are displayed)  Labs Reviewed  BASIC METABOLIC PANEL - Abnormal; Notable for the following components:      Result Value   Glucose, Bld 149 (*)    BUN 37 (*)    Creatinine, Ser 1.61 (*)    GFR, Estimated 33 (*)    All other components within normal limits  CBC - Abnormal; Notable for the following components:   WBC 12.1 (*)    RBC 3.66 (*)    Hemoglobin 11.7 (*)    All other components within normal limits  HEMOGLOBIN A1C - Abnormal; Notable for the following components:   Hgb A1c MFr Bld 6.0 (*)    All other components within normal limits  GLUCOSE, CAPILLARY - Abnormal; Notable for the following components:   Glucose-Capillary 132 (*)    All other components within normal limits     IMAGES: CXR 09/29/21:  IMPRESSION: Resolved left basilar segmental atelectasis.   COPD.   Small bilateral pleural effusions.  EKG 03/09/22:  Sinus rhythm with PACs Possible inferior infarct, age undetermined  CV:  Telemetry 05/09/19:  1: Sinus rhythm/sinus bradycardia/sinus tachycardia 2: Episodes of PAF with RVR 3: Episodes of SVT 4: Episodes of nonsustained ventricular tachycardia 5: These return office visit to discuss  Echo 01/23/19:  IMPRESSIONS     1. The left ventricle has normal systolic function with an ejection  fraction of 60-65%. The cavity size was normal. There is mildly increased  left ventricular wall thickness. Left ventricular diastolic Doppler  parameters are consistent with impaired  relaxation. Elevated left atrial and left ventricular  end-diastolic  pressures The E/e' is >15. No evidence of left ventricular regional wall  motion abnormalities.   2. The right ventricle has normal systolic function. The cavity was  normal. There is no increase in right ventricular wall thickness.   3. The mitral valve is grossly normal.   4. The tricuspid valve is grossly normal.   5. The aortic valve is tricuspid. Mild sclerosis of the aortic valve. No  stenosis of the aortic valve.   6. The aorta is normal in size and structure.   Nuclear ST 01/24/2019:  The left ventricular ejection fraction is normal (55-65%). Nuclear stress EF: 62%. There was no ST segment deviation noted during stress. The study is normal. This is a low risk study. No change compared to prior study.     Past Medical History:  Diagnosis Date   A-fib [facility])    Arthritis    Chronic bronchitis (HCC)    Chronic kidney disease    sees dr sanford yearly stage 3 stable   Claudication (HCC)    Complication of anesthesia 2000   slow to awaken after hip surgery   COPD (chronic obstructive pulmonary disease) (HCC)    Diabetes mellitus without complication (HCC)    type 1   Hyperlipidemia    Hypertension    Hypothyroidism    Osteoporosis    PAD (peripheral artery disease) (HCC)    a. 07/29/13: s/p PV angiogram with successful diamondback orbital rotational atherectomy of the high-grade calcified R popliteal artery stenosis   Shortness of breath    on exertion   Thyroid disease     Past Surgical History:  Procedure Laterality Date   ABDOMINAL AORTAGRAM  06/04/2013   Procedure: ABDOMINAL AORTAGRAM;  Surgeon: Runell Gess, MD;  Location: [facility] CATH LAB;  Service: Cardiovascular;;   ATHERECTOMY Right 07/29/2013   popliteal/notes 07/29/2013   CHOLECYSTECTOMY  1980's?   COLONOSCOPY WITH PROPOFOL N/A 09/06/2016   Procedure: COLONOSCOPY WITH PROPOFOL;  Surgeon: Charolett Bumpers, MD;  [facility];  Service: Endoscopy;  Laterality: N/A;   cyst from right hand      ESOPHAGOGASTRODUODENOSCOPY (EGD) WITH PROPOFOL N/A 09/06/2016   Procedure: ESOPHAGOGASTRODUODENOSCOPY (EGD) WITH PROPOFOL;  Surgeon: Charolett Bumpers, MD;  Location[facility];  Service: Endoscopy;  Laterality: N/A;   HIP FRACTURE SURGERY Left 2000's   "I broke the ball; dr broke femur during OR; got a rod in there" 92/07/2013)   INTRAMEDULLARY (IM) NAIL INTERTROCHANTERIC Right 08/06/2021   Procedure: INTRAMEDULLARY (IM) NAIL INTERTROCHANTRIC;  Surgeon: Roby Lofts, MD;  Location: [facility] OR;  Service: Orthopedics;  Laterality: Right;   LOWER EXTREMITY ANGIOGRAM N/A 06/04/2013   Procedure: LOWER EXTREMITY ANGIOGRAM;  Surgeon: Runell Gess, MD;  Location: [facility] CATH LAB;  Service: Cardiovascular;  Laterality: N/A;    MEDICATIONS:  albuterol (PROVENTIL HFA;VENTOLIN HFA) 108 (90 BASE) MCG/ACT inhaler   amLODipine (NORVASC) 5 MG tablet   atorvastatin (LIPITOR) 80 MG tablet   budesonide-formoterol (SYMBICORT) 160-4.5  MCG/ACT inhaler   ELIQUIS 2.5 MG TABS tablet   hydrochlorothiazide (HYDRODIURIL) 25 MG tablet   Insulin Glargine (BASAGLAR KWIKPEN) 100 UNIT/ML   levothyroxine (SYNTHROID) 100 MCG tablet   metoprolol succinate (TOPROL-XL) 25 MG 24 hr tablet   NOVOLOG FLEXPEN 100 UNIT/ML FlexPen   ONE TOUCH ULTRA TEST test strip   ramipril (ALTACE) 5 MG capsule   No current facility-administered medications for this encounter.   Marcille Blanco [facility] Surgical Short Stay/Anesthesiology [facility] Phone [phone] 11/02/2022 11:12 AM

## 2022-11-02 NOTE — Anesthesia Preprocedure Evaluation (Deleted)
Anesthesia Evaluation    Airway        Dental   Pulmonary Current Smoker          Cardiovascular hypertension,      Neuro/Psych    GI/Hepatic   Endo/Other  diabetes    Renal/GU      Musculoskeletal   Abdominal   Peds  Hematology   Anesthesia Other Findings   Reproductive/Obstetrics                              Anesthesia Physical Anesthesia Plan  ASA:   Anesthesia Plan:    Post-op Pain Management:    Induction:   PONV Risk Score and Plan:   Airway Management Planned:   Additional Equipment:   Intra-op Plan:   Post-operative Plan:   Informed Consent:   Plan Discussed with:   Anesthesia Plan Comments: (See PAT note from 5/7 by Sherlie Ban, PA-C)         Anesthesia Quick Evaluation

## 2022-11-03 DIAGNOSIS — I480 Paroxysmal atrial fibrillation: Secondary | ICD-10-CM | POA: Diagnosis not present

## 2022-11-03 DIAGNOSIS — I700 Atherosclerosis of aorta: Secondary | ICD-10-CM | POA: Diagnosis not present

## 2022-11-03 DIAGNOSIS — E1022 Type 1 diabetes mellitus with diabetic chronic kidney disease: Secondary | ICD-10-CM | POA: Diagnosis not present

## 2022-11-03 DIAGNOSIS — E1051 Type 1 diabetes mellitus with diabetic peripheral angiopathy without gangrene: Secondary | ICD-10-CM | POA: Diagnosis not present

## 2022-11-03 DIAGNOSIS — N1832 Chronic kidney disease, stage 3b: Secondary | ICD-10-CM | POA: Diagnosis not present

## 2022-11-03 DIAGNOSIS — F325 Major depressive disorder, single episode, in full remission: Secondary | ICD-10-CM | POA: Diagnosis not present

## 2022-11-03 DIAGNOSIS — E7800 Pure hypercholesterolemia, unspecified: Secondary | ICD-10-CM | POA: Diagnosis not present

## 2022-11-03 DIAGNOSIS — E039 Hypothyroidism, unspecified: Secondary | ICD-10-CM | POA: Diagnosis not present

## 2022-11-03 DIAGNOSIS — J449 Chronic obstructive pulmonary disease, unspecified: Secondary | ICD-10-CM | POA: Diagnosis not present

## 2022-11-03 DIAGNOSIS — D6869 Other thrombophilia: Secondary | ICD-10-CM | POA: Diagnosis not present

## 2022-11-03 DIAGNOSIS — I10 Essential (primary) hypertension: Secondary | ICD-10-CM | POA: Diagnosis not present

## 2022-11-16 DIAGNOSIS — L0100 Impetigo, unspecified: Secondary | ICD-10-CM | POA: Diagnosis not present

## 2022-11-18 ENCOUNTER — Emergency Department (HOSPITAL_BASED_OUTPATIENT_CLINIC_OR_DEPARTMENT_OTHER): Payer: Medicare HMO | Admitting: Radiology

## 2022-11-18 ENCOUNTER — Emergency Department (HOSPITAL_BASED_OUTPATIENT_CLINIC_OR_DEPARTMENT_OTHER): Payer: Medicare HMO

## 2022-11-18 ENCOUNTER — Encounter (HOSPITAL_BASED_OUTPATIENT_CLINIC_OR_DEPARTMENT_OTHER): Payer: Self-pay | Admitting: Emergency Medicine

## 2022-11-18 ENCOUNTER — Other Ambulatory Visit: Payer: Self-pay

## 2022-11-18 ENCOUNTER — Other Ambulatory Visit (HOSPITAL_BASED_OUTPATIENT_CLINIC_OR_DEPARTMENT_OTHER): Payer: Self-pay

## 2022-11-18 ENCOUNTER — Emergency Department (HOSPITAL_BASED_OUTPATIENT_CLINIC_OR_DEPARTMENT_OTHER)
Admission: EM | Admit: 2022-11-18 | Discharge: 2022-11-18 | Disposition: A | Discharge status: Home / Self Care | Payer: Medicare HMO | Attending: Emergency Medicine | Admitting: Emergency Medicine

## 2022-11-18 DIAGNOSIS — Z79899 Other long term (current) drug therapy: Secondary | ICD-10-CM | POA: Diagnosis not present

## 2022-11-18 DIAGNOSIS — J449 Chronic obstructive pulmonary disease, unspecified: Secondary | ICD-10-CM | POA: Insufficient documentation

## 2022-11-18 DIAGNOSIS — N183 Chronic kidney disease, stage 3 unspecified: Secondary | ICD-10-CM | POA: Diagnosis not present

## 2022-11-18 DIAGNOSIS — E039 Hypothyroidism, unspecified: Secondary | ICD-10-CM | POA: Diagnosis not present

## 2022-11-18 DIAGNOSIS — W01198A Fall on same level from slipping, tripping and stumbling with subsequent striking against other object, initial encounter: Secondary | ICD-10-CM | POA: Diagnosis not present

## 2022-11-18 DIAGNOSIS — S0240FA Zygomatic fracture, left side, initial encounter for closed fracture: Secondary | ICD-10-CM | POA: Diagnosis not present

## 2022-11-18 DIAGNOSIS — Z7901 Long term (current) use of anticoagulants: Secondary | ICD-10-CM | POA: Insufficient documentation

## 2022-11-18 DIAGNOSIS — Z794 Long term (current) use of insulin: Secondary | ICD-10-CM | POA: Insufficient documentation

## 2022-11-18 DIAGNOSIS — I129 Hypertensive chronic kidney disease with stage 1 through stage 4 chronic kidney disease, or unspecified chronic kidney disease: Secondary | ICD-10-CM | POA: Diagnosis not present

## 2022-11-18 DIAGNOSIS — S51812A Laceration without foreign body of left forearm, initial encounter: Secondary | ICD-10-CM | POA: Diagnosis not present

## 2022-11-18 DIAGNOSIS — E1022 Type 1 diabetes mellitus with diabetic chronic kidney disease: Secondary | ICD-10-CM | POA: Insufficient documentation

## 2022-11-18 DIAGNOSIS — S51012A Laceration without foreign body of left elbow, initial encounter: Secondary | ICD-10-CM | POA: Insufficient documentation

## 2022-11-18 DIAGNOSIS — S02401A Maxillary fracture, unspecified, initial encounter for closed fracture: Secondary | ICD-10-CM | POA: Insufficient documentation

## 2022-11-18 DIAGNOSIS — R519 Headache, unspecified: Secondary | ICD-10-CM | POA: Diagnosis not present

## 2022-11-18 DIAGNOSIS — Z91040 Latex allergy status: Secondary | ICD-10-CM | POA: Insufficient documentation

## 2022-11-18 DIAGNOSIS — S199XXA Unspecified injury of neck, initial encounter: Secondary | ICD-10-CM | POA: Diagnosis not present

## 2022-11-18 DIAGNOSIS — S0240DA Maxillary fracture, left side, initial encounter for closed fracture: Secondary | ICD-10-CM | POA: Diagnosis not present

## 2022-11-18 MED ORDER — ACETAMINOPHEN 325 MG PO TABS
650.0000 mg | ORAL_TABLET | Freq: Once | ORAL | Status: AC
  Administered 2022-11-18: 650 mg via ORAL
  Filled 2022-11-18: qty 2

## 2022-11-18 NOTE — ED Notes (Signed)
Wound cleaned... Left forearm skin tear bandaged and left wound as is... Provider asked if need to fix the skin flap and they stated no... Provider requested a non stick pad and flex wrap... Provider requested no antibiotic cream or anything besides what's noted.Marland KitchenMarland Kitchen

## 2022-11-18 NOTE — ED Notes (Signed)
Discharge paperwork given and verbally understood. 

## 2022-11-18 NOTE — Patient Instructions (Addendum)
DUE TO COVID-19 ONLY TWO VISITORS  (aged 80 and older)  ARE ALLOWED TO COME WITH YOU AND STAY IN THE WAITING ROOM ONLY DURING PRE OP AND PROCEDURE.   **NO VISITORS ARE ALLOWED IN THE SHORT STAY AREA OR RECOVERY ROOM!!**  IF YOU WILL BE ADMITTED INTO THE HOSPITAL YOU ARE ALLOWED ONLY FOUR SUPPORT PEOPLE DURING VISITATION HOURS ONLY (7 AM -8PM)   The support person(s) must pass our screening, gel in and out, and wear a mask at all times, including in the patient's room. Patients must also wear a mask when staff or their support person are in the room. Visitors GUEST BADGE MUST BE WORN VISIBLY  One adult visitor may remain with you overnight and MUST be in the room by 8 P.M.     Your procedure is scheduled on: 12/15/22   Report to [facility] Main Entrance    Report to admitting at  5:15 AM   Call this number if you have problems the morning of surgery [phone]   Do not eat food :After Midnight.   After Midnight you may have the following liquids until _4:30_____ AM/  DAY OF SURGERY  Water Black Coffee (sugar ok, NO MILK/CREAM OR CREAMERS)  Tea (sugar ok, NO MILK/CREAM OR CREAMERS) regular and decaf                             Plain Jell-O (NO RED)                                           Fruit ices (not with fruit pulp, NO RED)                                     Popsicles (NO RED)                                                                  Juice: apple, WHITE grape, WHITE cranberry Sports drinks like Gatorade (NO RED)                   The day of surgery:  Drink ONE (1) G2 at  4:15 AM the morning of surgery. Drink in one sitting. Do not sip.  This drink was given to you during your hospital  pre-op appointment visit. Nothing else to drink after completing the  G2.at 4:30 AM          If you have questions, please contact your surgeon's office.      Oral Hygiene is also important to reduce your risk of infection.                                    Remember  - BRUSH YOUR TEETH THE MORNING OF SURGERY WITH YOUR REGULAR TOOTHPASTE  Do not smoke after midnight    Take these medicines the morning of surgery with A SIP OF WATER: Use inhalers and bring then with you  Atorvastatin                                                                                                                            Metoprolol                                                                                                                            Levothyroxine   Before surgery.Stop taking __Eliquis 72 hours (3 days) prior to day of surgery. Last dose 6/16 24___as instructed by _____________.  Stop taking ____________as directed by your Surgeon/Cardiologist.  Contact your Surgeon/Cardiologist for instructions on Anticoagulant Therapy prior to surgery.   How to Manage Your Diabetes Before and After Surgery  Why is it important to control my blood sugar before and after surgery? Improving blood sugar levels before and after surgery helps healing and can limit problems. A way of improving blood sugar control is eating a healthy diet by:  Eating less sugar and carbohydrates  Increasing activity/exercise  Talking with your doctor about reaching your blood sugar goals High blood sugars (greater than 180 mg/dL) can raise your risk of infections and slow your recovery, so you will need to focus on controlling your diabetes during the weeks before surgery. Make sure that the doctor who takes care of your diabetes knows about your planned surgery including the date and location.  How do I manage my blood sugar before surgery? Check your blood sugar at least 4 times a day, starting 2 days before surgery, to make sure that the level is not too high or low. Check your blood sugar the morning of your surgery when you wake up and every 2 hours until you get  to the Short Stay unit. If your blood sugar is less than 70 mg/dL, you will need to treat for low blood sugar: Do not take insulin. Treat a low blood sugar (less than 70 mg/dL) with  cup of clear juice (cranberry or apple), 4 glucose tablets, OR glucose gel. Recheck blood sugar in 15 minutes after treatment (to make sure it is greater than 70 mg/dL). If your blood sugar is not greater than 70 mg/dL on recheck, call [phone] for further instructions. Report your blood sugar to the short stay nurse when you get to Short Stay.  If you are admitted to the hospital after surgery: Your blood sugar will be checked by the staff and you will probably be given insulin  after surgery (instead of oral diabetes medicines) to make sure you have good blood sugar levels. The goal for blood sugar control after surgery is 80-180 mg/dL.   WHAT DO I DO ABOUT MY DIABETES MEDICATION?  Do not take oral diabetes medicines (pills) the morning of surgery.  THE NIGHT BEFORE SURGERY, take 0    units of Novolog  insulin.       THE MORNING OF SURGERY, take 0  units of  Glargine  insulin.   If your CBG is greater than 220 mg/dL, you may take  of your sliding scale Novolog (correction) dose of insulin.                                   You may not have any metal on your body including hair pins, jewelry, and body piercing             Do not wear make-up, lotions, powders, perfumes or deodorant  Do not wear nail polish including gel and S&S, artificial/acrylic nails, or any other type of covering on natural nails including finger and toenails. If you have artificial nails, gel coating, etc. that needs to be removed by a nail salon please have this removed prior to surgery or surgery may need to be canceled/ delayed if the surgeon/ anesthesia feels like they are unable to be safely monitored.   Do not shave  48 hours prior to surgery.     Do not bring valuables to the hospital. [facility] IS NOT              RESPONSIBLE   FOR VALUABLES.   Contacts, glasses, or bridgework may not be worn into surgery.   Bring small overnight bag day of surgery.   DO NOT BRING YOUR HOME MEDICATIONS TO THE HOSPITAL. PHARMACY WILL DISPENSE MEDICATIONS LISTED ON YOUR MEDICATION LIST TO YOU DURING YOUR ADMISSION IN THE HOSPITAL!               Please read over the following fact sheets you were given: IF YOU HAVE QUESTIONS ABOUT YOUR PRE-OP INSTRUCTIONS PLEASE CALL [phone]    [facility] - Preparing for Surgery Before surgery, you can play an important role.  Because skin is not sterile, your skin needs to be as free of germs as possible.  You can reduce the number of germs on your skin by washing with CHG (chlorahexidine gluconate) soap before surgery.  CHG is an antiseptic cleaner which kills germs and bonds with the skin to continue killing germs even after washing. Please DO NOT use if you have an allergy to CHG or antibacterial soaps.  If your skin becomes reddened/irritated stop using the CHG and inform your nurse when you arrive at Short Stay. Do not shave (including legs and underarms) for at least 48 hours prior to the first CHG shower.   Please follow these instructions carefully:  1.  Shower with CHG Soap the night before surgery and the  morning of Surgery.  2.  If you choose to wash your hair, wash your hair first as usual with your  normal  shampoo.  3.  After you shampoo, rinse your hair and body thoroughly to remove the  shampoo.                            4.  Use CHG as you would any  other liquid soap.  You can apply chg directly  to the skin and wash                       Gently with a scrungie or clean washcloth.  5.  Apply the CHG Soap to your body ONLY FROM THE NECK DOWN.   Do not use on face/ open                           Wound or open sores. Avoid contact with eyes, ears mouth and genitals (private parts).                       Wash face,  Genitals (private parts) with your normal soap.              6.  Wash thoroughly, paying special attention to the area where your surgery  will be performed.  7.  Thoroughly rinse your body with warm water from the neck down.  8.  DO NOT shower/wash with your normal soap after using and rinsing off  the CHG Soap.             9.  Pat yourself dry with a clean towel.            10.  Wear clean pajamas.            11.  Place clean sheets on your bed the night of your first shower and do not  sleep with pets. Day of Surgery : Do not apply any lotions/deodorants the morning of surgery.  Please wear clean clothes to the hospital/surgery center.  FAILURE TO FOLLOW THESE INSTRUCTIONS MAY RESULT IN THE CANCELLATION OF YOUR SURGERY  PATIENT SIGNATURE_________________________________   ________________________________________________________________________  Rogelia Mire  An incentive spirometer is a tool that can help keep your lungs clear and active. This tool measures how well you are filling your lungs with each breath. Taking long deep breaths may help reverse or decrease the chance of developing breathing (pulmonary) problems (especially infection) following: A long period of time when you are unable to move or be active. BEFORE THE PROCEDURE  If the spirometer includes an indicator to show your best effort, your nurse or respiratory therapist will set it to a desired goal. If possible, sit up straight or lean slightly forward. Try not to slouch. Hold the incentive spirometer in an upright position. INSTRUCTIONS FOR USE  Sit on the edge of your bed if possible, or sit up as far as you can in bed or on a chair. Hold the incentive spirometer in an upright position. Breathe out normally. Place the mouthpiece in your mouth and seal your lips tightly around it. Breathe in slowly and as deeply as possible, raising the piston or the ball toward the top of the column. Hold your breath for 3-5 seconds or for as long as possible. Allow the  piston or ball to fall to the bottom of the column. Remove the mouthpiece from your mouth and breathe out normally. Rest for a few seconds and repeat Steps 1 through 7 at least 10 times every 1-2 hours when you are awake. Take your time and take a few normal breaths between deep breaths. The spirometer may include an indicator to show your best effort. Use the indicator as a goal to work toward during each repetition. After each set of 10 deep breaths, practice coughing to  be sure your lungs are clear. If you have an incision (the cut made at the time of surgery), support your incision when coughing by placing a pillow or rolled up towels firmly against it. Once you are able to get out of bed, walk around indoors and cough well. You may stop using the incentive spirometer when instructed by your caregiver.  RISKS AND COMPLICATIONS Take your time so you do not get dizzy or light-headed. If you are in pain, you may need to take or ask for pain medication before doing incentive spirometry. It is harder to take a deep breath if you are having pain. AFTER USE Rest and breathe slowly and easily. It can be helpful to keep track of a log of your progress. Your caregiver can provide you with a simple table to help with this. If you are using the spirometer at home, follow these instructions: SEEK MEDICAL CARE IF:  You are having difficultly using the spirometer. You have trouble using the spirometer as often as instructed. Your pain medication is not giving enough relief while using the spirometer. You develop fever of 100.5 F (38.1 C) or higher. SEEK IMMEDIATE MEDICAL CARE IF:  You cough up bloody sputum that had not been present before. You develop fever of 102 F (38.9 C) or greater. You develop worsening pain at or near the incision site. MAKE SURE YOU:  Understand these instructions. Will watch your condition. Will get help right away if you are not doing well or get worse. Document  Released: 10/24/2006 Document Revised: 09/05/2011 Document Reviewed: 12/25/2006 [facility] Patient Information 2014 [place], [place].   ________________________________________________________________________

## 2022-11-18 NOTE — ED Provider Notes (Signed)
[name] EMERGENCY DEPARTMENT AT [facility] Provider Note   CSN: [id number] Arrival date & time: 11/18/22  8295     History  Chief Complaint  Patient presents with   Ariel Cobb    Ariel Cobb is a 77 y.o. female with a past medical history significant for A-fib on Eliquis, CKD, COPD, diabetes, hypertension, hyperlipidemia, hypothyroidism, thyroid disease presents today for evaluation after a fall.  Patient states she was trying to get out of bed, reaching her walker but it tipped over so she fell forward down on the floor.  She reports hitting her face on the floor and had some nosebleeding but it has stopped.  She denies hitting her head, LOC, nausea, vomiting, vision changes.  She complains of pain on the left side of her face.  She denies any chest pain, shortness of breath, abdominal pain, back pain, hip pain, knee pain.   Fall      Past Medical History:  Diagnosis Date   A-fib [facility])    Arthritis    Chronic bronchitis (HCC)    Chronic kidney disease    sees dr sanford yearly stage 3 stable   Claudication (HCC)    Complication of anesthesia 2000   slow to awaken after hip surgery   COPD (chronic obstructive pulmonary disease) (HCC)    Diabetes mellitus without complication (HCC)    type 1   Hyperlipidemia    Hypertension    Hypothyroidism    Osteoporosis    PAD (peripheral artery disease) (HCC)    a. 07/29/13: s/p PV angiogram with successful diamondback orbital rotational atherectomy of the high-grade calcified R popliteal artery stenosis   Shortness of breath    on exertion   Thyroid disease    Past Surgical History:  Procedure Laterality Date   ABDOMINAL AORTAGRAM  06/04/2013   Procedure: ABDOMINAL AORTAGRAM;  Surgeon: Runell Gess, MD;  Location: [facility] CATH LAB;  Service: Cardiovascular;;   ATHERECTOMY Right 07/29/2013   popliteal/notes 07/29/2013   CHOLECYSTECTOMY  1980's?   COLONOSCOPY WITH PROPOFOL N/A 09/06/2016   Procedure: COLONOSCOPY WITH  PROPOFOL;  Surgeon: Charolett Bumpers, MD;  Location: [facility] ENDOSCOPY;  Service: Endoscopy;  Laterality: N/A;   cyst from right hand     ESOPHAGOGASTRODUODENOSCOPY (EGD) WITH PROPOFOL N/A 09/06/2016   Procedure: ESOPHAGOGASTRODUODENOSCOPY (EGD) WITH PROPOFOL;  Surgeon: Charolett Bumpers, MD;  Location: [facility] ENDOSCOPY;  Service: Endoscopy;  Laterality: N/A;   HIP FRACTURE SURGERY Left 2000's   "I broke the ball; dr broke femur during OR; got a rod in there" 92/07/2013)   INTRAMEDULLARY (IM) NAIL INTERTROCHANTERIC Right 08/06/2021   Procedure: INTRAMEDULLARY (IM) NAIL INTERTROCHANTRIC;  Surgeon: Roby Lofts, MD;  Location: [facility] OR;  Service: Orthopedics;  Laterality: Right;   LOWER EXTREMITY ANGIOGRAM N/A 06/04/2013   Procedure: LOWER EXTREMITY ANGIOGRAM;  Surgeon: Runell Gess, MD;  Location: [facility] CATH LAB;  Service: Cardiovascular;  Laterality: N/A;     Home Medications Prior to Admission medications   Medication Sig Start Date End Date Taking? Authorizing Provider  doxycycline (VIBRAMYCIN) 100 MG capsule Take 100 mg by mouth 2 (two) times daily. 11/16/22  Yes [provider]  albuterol (PROVENTIL HFA;VENTOLIN HFA) 108 (90 BASE) MCG/ACT inhaler Inhale into the lungs every 6 (six) hours as needed for wheezing or shortness of breath.    [provider]  amLODipine (NORVASC) 5 MG tablet Take 1 tablet (5 mg total) by mouth daily. Patient not taking: Reported on 10/28/2022 04/05/22   Nanetta Batty  J, MD  atorvastatin (LIPITOR) 80 MG tablet Take 1 tablet (80 mg total) by mouth every morning. 09/27/22   Runell Gess, MD  budesonide-formoterol [facility]) 160-4.5 MCG/ACT inhaler Inhale 2 puffs into the lungs 2 (two) times daily.    [provider]  ELIQUIS 2.5 MG TABS tablet TAKE 1 TABLET BY MOUTH TWICE A DAY 10/13/22   Runell Gess, MD  hydrochlorothiazide (HYDRODIURIL) 25 MG tablet Take 25 mg by mouth daily.    [provider]  Insulin Glargine (BASAGLAR KWIKPEN)  100 UNIT/ML Inject 8 Units into the skin daily.    [provider]  levothyroxine (SYNTHROID) 100 MCG tablet Take 100 mcg by mouth daily before breakfast.    [provider]  metoprolol succinate (TOPROL-XL) 25 MG 24 hr tablet TAKE 1 TABLET BY MOUTH EVERY DAY 04/11/22   Runell Gess, MD  mupirocin ointment (BACTROBAN) 2 % Apply 1 Application topically.    [provider]  NOVOLOG FLEXPEN 100 UNIT/ML FlexPen Inject 5-7 Units into the skin See admin instructions. Inject 5 units under the skin in the morning, 6 units at lunch and 7 units in the evening  (Sliding scale) 12/23/14   [provider]  ONE TOUCH ULTRA TEST test strip TEST THREE TIMES DAILY E11.22 10/06/17   [provider]  ramipril (ALTACE) 5 MG capsule Take 10 mg by mouth daily.    [provider]      Allergies    Adhesive [tape], Amoxicillin, Latex, Other, and Pioglitazone    Review of Systems   Review of Systems Negative except as per HPI.  Physical Exam Updated Vital Signs BP (!) 190/62 (BP Location: Right Arm)   Pulse 65   Temp 97.7 F (36.5 C) (Oral)   Resp 15   Ht 5\' 5"  (1.651 m)   Wt 49 kg   SpO2 100%   BMI 17.97 kg/m  Physical Exam Vitals and nursing note reviewed.  Constitutional:      Appearance: Normal appearance.  HENT:     Head: Normocephalic and atraumatic.     Nose:     Comments: Dried blood in the left nare.    Mouth/Throat:     Mouth: Mucous membranes are moist.  Eyes:     General: No scleral icterus. Cardiovascular:     Rate and Rhythm: Normal rate and regular rhythm.     Pulses: Normal pulses.     Heart sounds: Normal heart sounds.  Pulmonary:     Effort: Pulmonary effort is normal.     Breath sounds: Normal breath sounds.  Abdominal:     General: Abdomen is flat.     Palpations: Abdomen is soft.     Tenderness: There is no abdominal tenderness.  Musculoskeletal:        General: No deformity.  Skin:    General: Skin is warm.      Findings: No rash.     Comments: Big skin tear near the left elbow.  Bleeding is controlled.  Skin redness on the left side of face but no bruises.  Neurological:     General: No focal deficit present.     Mental Status: She is alert.  Psychiatric:        Mood and Affect: Mood normal.    ED Results / Procedures / Treatments   Labs (all labs ordered are listed, but only abnormal results are displayed) Labs Reviewed - No data to display  EKG None  Radiology No results found.  Procedures Procedures    Medications Ordered in ED Medications  acetaminophen (TYLENOL) tablet 650 mg (650 mg Oral Given 11/18/22 0943)    ED Course/ Medical Decision Making/ A&P                             Medical Decision Making Amount and/or Complexity of Data Reviewed Radiology: ordered.  Risk OTC drugs.   This patient presents to the ED for evaluation after fall, this involves an extensive number of treatment options, and is a complaint that carries with a high risk of complications and morbidity.  The differential diagnosis includes fracture, dislocation, head bleed, hematoma.  This is not an exhaustive list.  Lab tests: I ordered and personally interpreted labs.  The pertinent results include: WBC unremarkable. Hbg unremarkable. Platelets unremarkable. Electrolytes unremarkable. BUN, creatinine unremarkable.   Imaging studies: I ordered imaging studies. I personally reviewed, interpreted imaging and agree with the radiologist's interpretations. The results include: CT maxillofacial showed an acute comminuted displaced fracture in the anterior, posteriolateral wall of the maxillary sinus extending posteriorly to the left zygomatic bone and anterior aspect of the left zygomatic arch.   Problem list/ ED course/ Critical interventions/ Medical management: HPI: See above Vital signs within normal range and stable throughout visit. Laboratory/imaging studies significant for: See above. On  physical examination, patient is afebrile and appears in no acute distress.  There was a big skin tear near the left elbow.  Bleeding is controlled.  Patient had nosebleeding earlier but that has stopped.  There was mild skin redness on the left side of the face near the nose. CT maxillofacial showed an acute comminuted displaced fracture in the anterior, posteriolateral wall of the maxillary sinus extending posteriorly to the left zygomatic bone and anterior aspect of the left zygomatic arch.  I will talk to ENT for recommendations.  Patient is stable at this point.  Vital signs are normal.  Nosebleeding has stopped. I have reviewed the patient home medicines and have made adjustments as needed.  Cardiac monitoring/EKG: The patient was maintained on a cardiac monitor.  I personally reviewed and interpreted the cardiac monitor which showed an underlying rhythm of: sinus rhythm.  Additional history obtained: External records from outside source obtained and reviewed including: Chart review including previous notes, labs, imaging.  Consultations obtained: I requested consultation with Dr. Jenne Pane ENT, and discussed lab and imaging findings as well as pertinent plan.  He recommended outpatient follow-up with ENT in 5 days for further evaluation and management.  Disposition Continued outpatient therapy. Follow-up with NT recommended for reevaluation of symptoms. Treatment plan discussed with patient.  Pt acknowledged understanding was agreeable to the plan. Worrisome signs and symptoms were discussed with patient, and patient acknowledged understanding to return to the ED if they noticed these signs and symptoms. Patient was stable upon discharge.   This chart was dictated using voice recognition software.  Despite best efforts to proofread,  errors can occur which can change the documentation meaning.          Final Clinical Impression(s) / ED Diagnoses Final diagnoses:  Closed fracture of  maxillary sinus, initial encounter [facility])    Rx / DC Orders ED Discharge Orders     None         Jeanelle Malling, [place] 11/18/22 1622    Blane Ohara, MD 11/23/22 1025

## 2022-11-18 NOTE — Discharge Instructions (Addendum)
Please take your medications as prescribed. Take tylenol/ibuprofen for pain. I recommend close follow-up with Dr. Jenne Pane ENT in the next 5 days for reevaluation.  Please do not hesitate to return to emergency department if worrisome signs symptoms we discussed become apparent.

## 2022-11-18 NOTE — ED Triage Notes (Signed)
Pt arrives to ED with c/o fall. Pt notes she fell this morning on carpet when he walker fell over causing her to trip. Pt notes skin tear to left arm and injury to left side of face. She notes she also lost power this morning which contributed to the fall.

## 2022-11-23 DIAGNOSIS — R42 Dizziness and giddiness: Secondary | ICD-10-CM | POA: Diagnosis not present

## 2022-11-23 DIAGNOSIS — S51811A Laceration without foreign body of right forearm, initial encounter: Secondary | ICD-10-CM | POA: Diagnosis not present

## 2022-11-23 DIAGNOSIS — S0240DS Maxillary fracture, left side, sequela: Secondary | ICD-10-CM | POA: Diagnosis not present

## 2022-11-23 DIAGNOSIS — W19XXXA Unspecified fall, initial encounter: Secondary | ICD-10-CM | POA: Diagnosis not present

## 2022-11-24 ENCOUNTER — Other Ambulatory Visit: Payer: Medicare HMO

## 2022-11-24 DIAGNOSIS — S0240DA Maxillary fracture, left side, initial encounter for closed fracture: Secondary | ICD-10-CM | POA: Diagnosis not present

## 2022-11-24 DIAGNOSIS — H6123 Impacted cerumen, bilateral: Secondary | ICD-10-CM | POA: Diagnosis not present

## 2022-11-24 DIAGNOSIS — R42 Dizziness and giddiness: Secondary | ICD-10-CM | POA: Diagnosis not present

## 2022-11-30 DIAGNOSIS — N1832 Chronic kidney disease, stage 3b: Secondary | ICD-10-CM | POA: Diagnosis not present

## 2022-11-30 DIAGNOSIS — E669 Obesity, unspecified: Secondary | ICD-10-CM | POA: Diagnosis not present

## 2022-11-30 DIAGNOSIS — I959 Hypotension, unspecified: Secondary | ICD-10-CM | POA: Diagnosis not present

## 2022-11-30 DIAGNOSIS — Z794 Long term (current) use of insulin: Secondary | ICD-10-CM | POA: Diagnosis not present

## 2022-11-30 DIAGNOSIS — E1022 Type 1 diabetes mellitus with diabetic chronic kidney disease: Secondary | ICD-10-CM | POA: Diagnosis not present

## 2022-11-30 DIAGNOSIS — Z720 Tobacco use: Secondary | ICD-10-CM | POA: Diagnosis not present

## 2022-11-30 DIAGNOSIS — E1042 Type 1 diabetes mellitus with diabetic polyneuropathy: Secondary | ICD-10-CM | POA: Diagnosis not present

## 2022-12-06 ENCOUNTER — Other Ambulatory Visit: Payer: Self-pay

## 2022-12-06 ENCOUNTER — Encounter (HOSPITAL_COMMUNITY)
Admission: RE | Admit: 2022-12-06 | Discharge: 2022-12-06 | Disposition: A | Discharge status: Home / Self Care | Payer: Medicare HMO | Source: Ambulatory Visit | Attending: Orthopedic Surgery | Admitting: Orthopedic Surgery

## 2022-12-06 ENCOUNTER — Encounter (HOSPITAL_COMMUNITY): Payer: Self-pay

## 2022-12-06 VITALS — BP 145/54 | HR 68 | Temp 97.4°F | Resp 18 | Ht 65.0 in | Wt 105.0 lb

## 2022-12-06 DIAGNOSIS — N1832 Chronic kidney disease, stage 3b: Secondary | ICD-10-CM | POA: Insufficient documentation

## 2022-12-06 DIAGNOSIS — Z01818 Encounter for other preprocedural examination: Secondary | ICD-10-CM | POA: Diagnosis not present

## 2022-12-06 DIAGNOSIS — Z794 Long term (current) use of insulin: Secondary | ICD-10-CM | POA: Diagnosis not present

## 2022-12-06 DIAGNOSIS — E1122 Type 2 diabetes mellitus with diabetic chronic kidney disease: Secondary | ICD-10-CM | POA: Insufficient documentation

## 2022-12-06 HISTORY — DX: Impetigo, unspecified: L01.00

## 2022-12-06 HISTORY — DX: Myoneural disorder, unspecified: G70.9

## 2022-12-06 LAB — BASIC METABOLIC PANEL
Anion gap: 11 (ref 5–15)
BUN: 53 mg/dL — ABNORMAL HIGH (ref 8–23)
CO2: 21 mmol/L — ABNORMAL LOW (ref 22–32)
Calcium: 8.7 mg/dL — ABNORMAL LOW (ref 8.9–10.3)
Chloride: 101 mmol/L (ref 98–111)
Creatinine, Ser: 1.41 mg/dL — ABNORMAL HIGH (ref 0.44–1.00)
GFR, Estimated: 38 mL/min — ABNORMAL LOW (ref >60)
Glucose, Bld: 200 mg/dL — ABNORMAL HIGH (ref 70–99)
Potassium: 4.3 mmol/L (ref 3.5–5.1)
Sodium: 133 mmol/L — ABNORMAL LOW (ref 135–145)

## 2022-12-06 LAB — CBC
HCT: 34.2 % — ABNORMAL LOW (ref 36.0–46.0)
Hemoglobin: 11.5 g/dL — ABNORMAL LOW (ref 12.0–15.0)
MCH: 32.1 pg (ref 26.0–34.0)
MCHC: 33.6 g/dL (ref 30.0–36.0)
MCV: 95.5 fL (ref 80.0–100.0)
Platelets: 299 10*3/uL (ref 150–400)
RBC: 3.58 MIL/uL — ABNORMAL LOW (ref 3.87–5.11)
RDW: 13.5 % (ref 11.5–15.5)
WBC: 10 10*3/uL (ref 4.0–10.5)
nRBC: 0 % (ref 0.0–0.2)

## 2022-12-06 LAB — GLUCOSE, CAPILLARY: Glucose-Capillary: 184 mg/dL — ABNORMAL HIGH (ref 70–99)

## 2022-12-06 NOTE — Progress Notes (Signed)
Anesthesia note:   PCP - Dr. Darrol Jump Cardiologist -Dr. Erlene Quan Other-   Chest x-ray - 09/30/22-epic EKG - 03/09/22-epic Stress Test - 2020 ECHO - 2020 Cardiac Cath - no CABG-no Pacemaker/ICD device last checked:no  Sleep Study - no CPAP -    CBG at PAT visit-184 Fasting Blood Sugar at home-72-130 Checks Blood Sugar ___5/day because the Dr. Has been adjusting doses__  Blood Thinner:Eliquis for A-fib Blood Thinner Instructions:hold 3 days last dose 6/16 Aspirin Instructions: Last Dose:  Anesthesia review: Yes   reason:  Patient denies shortness of breath, fever, cough and chest pain at PAT appointment. Pt is a smoker and has COPD. She is weak and uses a walker at home. She can't tolerate the gatorade and she can't get into the shower because she is unable to lift her leg.The fentanyl that she got in the ED  made her very confused.She has been treated for impetigo   Patient verbalized understanding of instructions that were given to them at the PAT appointment. Patient was also instructed that they will need to review over the PAT instructions again at home before surgery.Yes her sister was with her.

## 2022-12-07 ENCOUNTER — Encounter: Payer: Self-pay | Admitting: Podiatry

## 2022-12-07 ENCOUNTER — Ambulatory Visit: Payer: Medicare HMO | Admitting: Podiatry

## 2022-12-07 DIAGNOSIS — M79674 Pain in right toe(s): Secondary | ICD-10-CM | POA: Diagnosis not present

## 2022-12-07 DIAGNOSIS — B351 Tinea unguium: Secondary | ICD-10-CM

## 2022-12-07 DIAGNOSIS — I739 Peripheral vascular disease, unspecified: Secondary | ICD-10-CM

## 2022-12-07 DIAGNOSIS — M79675 Pain in left toe(s): Secondary | ICD-10-CM | POA: Diagnosis not present

## 2022-12-07 DIAGNOSIS — D689 Coagulation defect, unspecified: Secondary | ICD-10-CM | POA: Diagnosis not present

## 2022-12-07 NOTE — Progress Notes (Signed)
  Subjective:  Patient ID: Ariel Cobb, female    DOB: 09/28/1945,   MRN: [id number]  Chief Complaint  Patient presents with   Diabetic Foot Care    RFC patient is diabetic     77 y.o. female presents for concern of thickened elongated and painful nails that are difficult to trim. Requesting to have them trimmed today. She is on a blood thinner and at risk for rfc.   PCP:  Renford Dills, MD    . Denies any other pedal complaints. Denies n/v/f/c.   Past Medical History:  Diagnosis Date   A-fib [facility])    Arthritis    Chronic bronchitis (HCC)    Chronic kidney disease    sees dr sanford yearly stage 3 stable   Claudication (HCC)    Complication of anesthesia 2000   slow to awaken after hip surgery   COPD (chronic obstructive pulmonary disease) (HCC)    Diabetes mellitus without complication (HCC)    type 1   Hyperlipidemia    Hypertension    Hypothyroidism    Impetigo    Neuromuscular disorder (HCC)    peripheral neuropathy   Osteoporosis    PAD (peripheral artery disease) (HCC)    a. 07/29/13: s/p PV angiogram with successful diamondback orbital rotational atherectomy of the high-grade calcified R popliteal artery stenosis   Shortness of breath    on exertion   Thyroid disease     Objective:  Physical Exam: Vascular: DP/PT pulses 2/4 bilateral. CFT <3 seconds. Absent hair growth on digits. Edema noted to bilateral lower extremities. Xerosis noted bilaterally.  Skin. No lacerations or abrasions bilateral feet. Nails 1-5 bilateral  are thickened discolored and elongated with subungual debris.  Musculoskeletal: MMT 5/5 bilateral lower extremities in DF, PF, Inversion and Eversion. Deceased ROM in DF of ankle joint.  Neurological: Sensation intact to light touch. Protective sensation intact bilateral.    Assessment:   1. Pain due to onychomycosis of toenails of both feet   2. Blood clotting disorder (HCC)   3. PAD (peripheral artery disease) (HCC)       Plan:   Patient was evaluated and treated and all questions answered. -Discussed supportive shoes at all times and checking feet regularly.  -Mechanically debrided all nails 1-5 bilateral using sterile nail nipper and filed with dremel without incident  -Answered all patient questions -Patient to return  in 3 months for at risk foot care -Patient advised to call the office if any problems or questions arise in the meantime.   Louann Sjogren, DPM

## 2022-12-09 DIAGNOSIS — I480 Paroxysmal atrial fibrillation: Secondary | ICD-10-CM | POA: Diagnosis not present

## 2022-12-09 DIAGNOSIS — E1022 Type 1 diabetes mellitus with diabetic chronic kidney disease: Secondary | ICD-10-CM | POA: Diagnosis not present

## 2022-12-09 DIAGNOSIS — I959 Hypotension, unspecified: Secondary | ICD-10-CM | POA: Diagnosis not present

## 2022-12-09 DIAGNOSIS — N1832 Chronic kidney disease, stage 3b: Secondary | ICD-10-CM | POA: Diagnosis not present

## 2022-12-09 DIAGNOSIS — D6869 Other thrombophilia: Secondary | ICD-10-CM | POA: Diagnosis not present

## 2022-12-15 ENCOUNTER — Ambulatory Visit (HOSPITAL_COMMUNITY): Admission: RE | Admit: 2022-12-15 | Payer: Medicare HMO | Source: Home / Self Care | Admitting: Orthopedic Surgery

## 2022-12-15 ENCOUNTER — Encounter (HOSPITAL_COMMUNITY): Admission: RE | Payer: Self-pay | Source: Home / Self Care

## 2022-12-15 SURGERY — REMOVAL, HARDWARE
Anesthesia: Spinal | Laterality: Right

## 2022-12-21 NOTE — Progress Notes (Unsigned)
Cardiology Clinic Note   Date: 12/22/2022 ID: Ariel Cobb, DOB 1945/06/29, MRN [id number]  Primary Cardiologist:  Nanetta Batty, MD  Patient Profile    Ariel Cobb is a 77 y.o. female who presents to the clinic today for preoperative risk assessment/BP concerns.    Past medical history significant for: PAF. Cardiac monitor 05/09/2019: SR/SB/ST.  Episodes of PAF with RVR.  Episodes of SVT.  Episodes of NSVT. PAD. Abdominal aortogram 06/04/2013: High-grade calcified stenosis right popliteal artery.  Recommend rotational atherectomy and PTA +/- stenting; will return for staged intervention. PV angiogram 07/29/2013: Successful rotational atherectomy right popliteal artery. Arterial ultrasound lower extremity 02/15/2022: Right: Atherosclerosis throughout.  Stable 50 to 74% stenosis in the distal SFA/AK popliteal artery.  Two-vessel runoff via PTA and peroneal artery; distal ATA occlusion noted.  Left: Atherosclerosis throughout.  30 to 49% stenosis distal SFA and below the knee popliteal artery.  Three-vessel runoff.  Mild progression on the left consistent with prior study.  Recommend repeating in 12 months.   Hypertension. Hyperlipidemia. Lipid panel 04/13/2022: LDL 75, HDL 48, TG, total 756. COPD. T2DM. Hypothyroidism. CKD stage IIIb. Tobacco abuse.     History of Present Illness    Ariel Cobb was first evaluated by Dr. [name] on 05/06/2013 for claudication at the request of Dr. Charlsie Merles at [facility].  Patient complained of right calf claudication with recent arterial Dopplers revealing right ABI 0.93 with a high-frequency signal in the right popliteal artery.  She ultimately underwent rotational atherectomy of the right popliteal artery in February 2015.  During routine follow-up 01/10/2019 patient complained of mild jaw pain and chest discomfort.  She had a normal low risk nuclear stress test. Echo showed normal LV/RV function, mild LVH and diastolic dysfunction.  Patient  was last evaluated in the office by Dr. Marina Goodell on 03/09/2022 for routine follow-up.  She was doing well at that time and recuperating from surgical fixation of fractured hip.  No changes were made.  Patient was evaluated in the ED on 11/18/2022 for mechanical fall in which she sustained skin tears to left arm and injury to left side of face.  No acute findings on CT head, cervical spine, maxillofacial.  Today, patient is accompanied by her sister.  They are very frustrated as patient has been pending hip surgery for several months.  Initially it was postponed secondary to patient developing impetigo.  Since then it has been postponed secondary to BP concerns.  Patient was found to be hypotensive (90/50) with orthostasis.  She has been working with PCP, Dr. Nehemiah Settle for her blood pressure.  Apparently orthopedics was concerned about patient's systolic blood pressure dropping >20 mmHg and diastolic blood pressure dropping  >10 mmHg when going from sit to stand.  Ultimately all antihypertensives were stopped except for low-dose ramipril.  She is now hypertensive with a slight drop in BP going from sit to stand.  Patient describes some positional dizziness that is more related to vertigo than blood pressure changes. Dizziness is described as a sensation of spinning and occurs occasionally with sit to stand and with laying her head back (like at the beauty salon).  Patient provides the following BP readings: 6/17 149/60 sitting and 128/61 standing 6/18 145/58 sitting and 135/60 standing 6/19 135/50 sitting and 117/56 standing 6/20 152/71 sitting and 134/54 standing 6/21 156/64 sitting and 124/60 standing 6/22 160/59 sitting and 136/67 standing Patient also gets readings in the evenings which are very similar. She admits to normally drinking very  little throughout the day. She has tried increasing fluid intake recently.  She reports developing some bilateral lower extremity edema since stopping medications.  She  wore compression stockings 1 day which improved edema but were very painful on her legs so she has not worn them since.  She admits to keeping her legs in a dependent position much of the day.  Edema is at its best in the morning and progresses throughout the day.  She reports generalized malaise and no energy that she attributes to anxiety and depression as she is nervous about surgery and frustrated that it has been postponed so many times.  She is able to perform most ADLs independently (needs help getting her shoes on) and is able to do light to moderate household activities.  She denies chest pain, increased shortness of breath, orthopnea, or PND.     ROS: All other systems reviewed and are otherwise negative except as noted in History of Present Illness.  Studies Reviewed       EKG is not performed today.  Risk Assessment/Calculations     CHA2DS2-VASc Score = 6   This indicates a 9.7% annual risk of stroke. The patient's score is based upon: CHF History: 0 HTN History: 1 Diabetes History: 1 Stroke History: 0 Vascular Disease History: 1 Age Score: 2 Gender Score: 1         Physical Exam    VS:  BP (!) 160/72   Pulse 71   Ht 5\' 5"  (1.651 m)   Wt 112 lb 9.6 oz (51.1 kg)   SpO2 98%   BMI 18.74 kg/m  , BMI Body mass index is 18.74 kg/m.  GEN: Well nourished, well developed, in no acute distress. Neck: No JVD or carotid bruits. Cardiac:  RRR. No murmurs. No rubs or gallops.   Respiratory:  Respirations regular and unlabored. Clear to auscultation without rales, wheezing or rhonchi. GI: Soft, nontender, nondistended. Extremities: Radials/DP/PT 2+ and equal bilaterally. No clubbing or cyanosis.  1+ edema bilateral feet and ankles L>R. Skin: Warm and dry, no rash. Neuro: Strength intact.  Assessment & Plan    PAF.  Cardiac monitor November 2020 showed episodes of PAF with RVR, episodes of SVT, episodes of NSVT. Patient reports she goes in and out of afib but it is  typically brief. Denies spontaneous bleeding concerns.  RRR on exam. Continue metoprolol, Eliquis. Appropriate Eliquis dose. Hypertension: BP today 160/72 sitting and 150/60 standing.  Patient denies headaches or vision changes. She reports occasional positional dizziness that she relates more to vertigo than BP changes.  Dizziness described as a spinning sensation and typically occurs when she lays her head back (like at the beauty salon) and occasionally with sit to stand.  Patient is now off all antihypertensives except for low-dose ramipril.  Home BP readings listed in HPI.  Patient is now hypertensive.  Her BP was as low as 90/50 prior to medications being stopped.  Given the importance of her undergoing surgery to repair her hip I feel hypertension is permissive at this point. She will undergo hardware removal and then hip replacement six weeks later. BP can be further addressed after hip surgeries. She is instructed to contact the office for SBP >160. Increase hydration. Continue  ramipril. Lower extremity edema. Patient reports increased lower extremity edema since stopping hydrochlorothiazide. She worse compression stockings one day with improvement of edema but they were painful particularly with getting them off so she has not worn them since. She reports edema is  at its best in the AM and progresses throughout the day. She admits to sitting in a dependent position for much of the day. She feels edema has gone down and is more at baseline. Denies orthopnea or PND. 1+ pitting edema bilateral feet and ankles L>R. Lung sounds diminished bilaterally without wheezing, rhonchi or rales. She is instructed to wear compression socks as tolerated and elevate her legs as much as possible. She can take a half of hydrochlorothiazide if she feels edema is increased and BP is >120/50.  Preoperative cardiovascular risk assessment. Hardware removal right hip with Dr. Linna Caprice. According to the RCRI, patient has a 0.9%  risk of MACE. Patient reports activity equivalent to 4.4 METS (per [employer]ASI). BP is now elevated with <20 mmHg decrease systolically and  <10 mmHg decrease diastolically the majority of home readings.  Based on ACC/AHA guidelines, MAIZEE REINHOLD would be at acceptable risk for the planned procedure without further cardiovascular testing.  Per Pharm.D., patient may hold Eliquis 3 days prior to procedure.  Disposition: Return in 3 months or sooner as needed.         Signed, Etta Grandchild. Matalynn Graff, DNP, NP-C

## 2022-12-22 ENCOUNTER — Encounter: Payer: Self-pay | Admitting: Student

## 2022-12-22 ENCOUNTER — Ambulatory Visit: Payer: Medicare HMO | Attending: Student | Admitting: Student

## 2022-12-22 VITALS — BP 160/72 | HR 71 | Ht 65.0 in | Wt 112.6 lb

## 2022-12-22 DIAGNOSIS — R600 Localized edema: Secondary | ICD-10-CM | POA: Diagnosis not present

## 2022-12-22 DIAGNOSIS — Z01810 Encounter for preprocedural cardiovascular examination: Secondary | ICD-10-CM | POA: Diagnosis not present

## 2022-12-22 DIAGNOSIS — I480 Paroxysmal atrial fibrillation: Secondary | ICD-10-CM

## 2022-12-22 DIAGNOSIS — I10 Essential (primary) hypertension: Secondary | ICD-10-CM

## 2022-12-22 NOTE — Patient Instructions (Signed)
Medication Instructions:   Your physician recommends that you continue on your current medications as directed. Please refer to the Current Medication list given to you today.  *If you need a refill on your cardiac medications before your next appointment, please call your pharmacy*  Lab Work: NONE ordered at this time of appointment   If you have labs (blood work) drawn today and your tests are completely normal, you will receive your results only by: MyChart Message (if you have MyChart) OR A paper copy in the mail If you have any lab test that is abnormal or we need to change your treatment, we will call you to review the results.  Testing/Procedures: NONE ordered at this time of appointment   Follow-Up: At [facility], you and your health needs are our priority.  As part of our continuing mission to provide you with exceptional heart care, we have created designated Provider Care Teams.  These Care Teams include your primary Cardiologist (physician) and Advanced Practice Providers (APPs -  Physician Assistants and Nurse Practitioners) who all work together to provide you with the care you need, when you need it.  We recommend signing up for the patient portal called "MyChart".  Sign up information is provided on this After Visit Summary.  MyChart is used to connect with patients for Virtual Visits (Telemedicine).  Patients are able to view lab/test results, encounter notes, upcoming appointments, etc.  Non-urgent messages can be sent to your provider as well.   To learn more about what you can do with MyChart, go to [email or web address].    Your next appointment:   3 month(s)  Provider:   Nanetta Batty, MD     Other Instructions

## 2022-12-28 NOTE — Patient Instructions (Addendum)
SURGICAL WAITING ROOM VISITATION  Patients having surgery or a procedure may have no more than 2 support people in the waiting area - these visitors may rotate.    Children under the age of 78 must have an adult with them who is not the patient.  If the patient needs to stay at the hospital during part of their recovery, the visitor guidelines for inpatient rooms apply. Pre-op nurse will coordinate an appropriate time for 1 support person to accompany patient in pre-op.  This support person may not rotate.    Please refer to the [facility] website for the visitor guidelines for Inpatients (after your surgery is over and you are in a regular room).       Your procedure is scheduled on: 01-18-23   Report to [facility] Main Entrance    Report to admitting at      1:15 PM   Call this number if you have problems the morning of surgery [phone]   Do not eat food :After Midnight.   After Midnight you may have the following liquids until __12:20____  PM DAY OF SURGERY  Water Non-Citrus Juices (without pulp, NO RED-Apple, White grape, White cranberry) Black Coffee (NO MILK/CREAM OR CREAMERS, sugar ok)  Clear Tea (NO MILK/CREAM OR CREAMERS, sugar ok) regular and decaf                             Plain Jell-O (NO RED)                                           Fruit ices (not with fruit pulp, NO RED)                                     Popsicles (NO RED)                                                               Sports drinks like Gatorade (NO RED)                  The day of surgery:  Drink ONE (1) Pre-Surgery  G2 at     12:20  PM the morning of surgery. Drink in one sitting. Do not sip.  This drink was given to you during your hospital  pre-op appointment visit. Nothing else to drink after completing the  Pre-Surgery G2.          If you have questions, please contact your surgeon's office.   FOLLOW  ANY ADDITIONAL PRE OP INSTRUCTIONS YOU RECEIVED FROM YOUR  SURGEON'S OFFICE!!!     Oral Hygiene is also important to reduce your risk of infection.                                    Remember - BRUSH YOUR TEETH THE MORNING OF SURGERY WITH YOUR REGULAR TOOTHPASTE  DENTURES WILL BE REMOVED PRIOR TO SURGERY PLEASE DO NOT APPLY "Poly grip" OR ADHESIVES!!!  Do NOT smoke after Midnight   Take these medicines the morning of surgery with A SIP OF WATER: inhalers and bring them with you, atorvastatin, levothyroxine How to Manage Your Diabetes Before and After Surgery  Why is it important to control my blood sugar before and after surgery? Improving blood sugar levels before and after surgery helps healing and can limit problems. A way of improving blood sugar control is eating a healthy diet by:  Eating less sugar and carbohydrates  Increasing activity/exercise  Talking with your doctor about reaching your blood sugar goals High blood sugars (greater than 180 mg/dL) can raise your risk of infections and slow your recovery, so you will need to focus on controlling your diabetes during the weeks before surgery. Make sure that the doctor who takes care of your diabetes knows about your planned surgery including the date and location.  How do I manage my blood sugar before surgery? Check your blood sugar at least 4 times a day, starting 2 days before surgery, to make sure that the level is not too high or low. Check your blood sugar the morning of your surgery when you wake up and every 2 hours until you get to the Short Stay unit. If your blood sugar is less than 70 mg/dL, you will need to treat for low blood sugar: Do not take insulin. Treat a low blood sugar (less than 70 mg/dL) with  cup of clear juice (cranberry or apple), 4 glucose tablets, OR glucose gel. Recheck blood sugar in 15 minutes after treatment (to make sure it is greater than 70 mg/dL). If your blood sugar is not greater than 70 mg/dL on recheck, call [phone] for further  instructions. Report your blood sugar to the short stay nurse when you get to Short Stay.  If you are admitted to the hospital after surgery: Your blood sugar will be checked by the staff and you will probably be given insulin after surgery (instead of oral diabetes medicines) to make sure you have good blood sugar levels. The goal for blood sugar control after surgery is 80-180 mg/dL.   WHAT DO I DO ABOUT MY DIABETES MEDICATION?  Do not take oral diabetes medicines (pills) the morning of surgery.  THE afternoon  BEFORE SURGERY, take 80% of normal insulin glargine dose    6.4 units is 80% of 8 units       DO NOT TAKE THE FOLLOWING 7 DAYS PRIOR TO SURGERY: Ozempic, Wegovy, Rybelsus (Semaglutide), Byetta (exenatide), Bydureon (exenatide ER), Victoza, Saxenda (liraglutide), or Trulicity (dulaglutide) Mounjaro (Tirzepatide) Adlyxin (Lixisenatide), Polyethylene Glycol Loxenatide.  If your CBG is greater than 220 mg/dL, you may take  of your sliding scale  (correction) dose of insulin.                                  You may not have any metal on your body including hair pins, jewelry, and body piercing             Do not wear make-up, lotions, powders, perfumes/cologne, or deodorant  Do not wear nail polish including gel and S&S, artificial/acrylic nails, or any other type of covering on natural nails including finger and toenails. If you have artificial nails, gel coating, etc. that needs to be removed by a nail salon please have this removed prior to surgery or surgery may need to be canceled/ delayed if the surgeon/ anesthesia feels like they  are unable to be safely monitored.   Do not shave  48 hours prior to surgery.               Do not bring valuables to the hospital. [facility] IS NOT             RESPONSIBLE   FOR VALUABLES.   Contacts, glasses, dentures or bridgework may not be worn into surgery.   Bring small overnight bag day of surgery.   DO NOT BRING YOUR HOME  MEDICATIONS TO THE HOSPITAL. PHARMACY WILL DISPENSE MEDICATIONS LISTED ON YOUR MEDICATION LIST TO YOU DURING YOUR ADMISSION IN THE HOSPITAL!    Patients discharged on the day of surgery will not be allowed to drive home.  Someone NEEDS to stay with you for the first 24 hours after anesthesia.   Special Instructions: Bring a copy of your healthcare power of attorney and living will documents the day of surgery if you haven't scanned them before.              Please read over the following fact sheets you were given: IF YOU HAVE QUESTIONS ABOUT YOUR PRE-OP INSTRUCTIONS PLEASE CALL [phone]   . If you test positive for Covid or have been in contact with anyone that has tested positive in the last 10 days please notify you surgeon.    [facility] - Preparing for Surgery Before surgery, you can play an important role.  Because skin is not sterile, your skin needs to be as free of germs as possible.  You can reduce the number of germs on your skin by washing with CHG (chlorahexidine gluconate) soap before surgery.  CHG is an antiseptic cleaner which kills germs and bonds with the skin to continue killing germs even after washing. Please DO NOT use if you have an allergy to CHG or antibacterial soaps.  If your skin becomes reddened/irritated stop using the CHG and inform your nurse when you arrive at Short Stay. Do not shave (including legs and underarms) for at least 48 hours prior to the first CHG shower.  You may shave your face/neck. Please follow these instructions carefully:  1.  Shower with CHG Soap the night before surgery and the  morning of Surgery.  2.  If you choose to wash your hair, wash your hair first as usual with your  normal  shampoo.  3.  After you shampoo, rinse your hair and body thoroughly to remove the  shampoo.                           4.  Use CHG as you would any other liquid soap.  You can apply chg directly  to the skin and wash                       Gently with a  scrungie or clean washcloth.  5.  Apply the CHG Soap to your body ONLY FROM THE NECK DOWN.   Do not use on face/ open                           Wound or open sores. Avoid contact with eyes, ears mouth and genitals (private parts).                       Wash face,  Genitals (private parts) with your normal soap.  6.  Wash thoroughly, paying special attention to the area where your surgery  will be performed.  7.  Thoroughly rinse your body with warm water from the neck down.  8.  DO NOT shower/wash with your normal soap after using and rinsing off  the CHG Soap.                9.  Pat yourself dry with a clean towel.            10.  Wear clean pajamas.            11.  Place clean sheets on your bed the night of your first shower and do not  sleep with pets. Day of Surgery : Do not apply any lotions/deodorants the morning of surgery.  Please wear clean clothes to the hospital/surgery center.  FAILURE TO FOLLOW THESE INSTRUCTIONS MAY RESULT IN THE CANCELLATION OF YOUR SURGERY PATIENT SIGNATURE_________________________________  NURSE SIGNATURE__________________________________  ________________________________________________________________________  Ariel Cobb  An incentive spirometer is a tool that can help keep your lungs clear and active. This tool measures how well you are filling your lungs with each breath. Taking long deep breaths may help reverse or decrease the chance of developing breathing (pulmonary) problems (especially infection) following: A long period of time when you are unable to move or be active. BEFORE THE PROCEDURE  If the spirometer includes an indicator to show your best effort, your nurse or respiratory therapist will set it to a desired goal. If possible, sit up straight or lean slightly forward. Try not to slouch. Hold the incentive spirometer in an upright position. INSTRUCTIONS FOR USE  Sit on the edge of your bed if possible, or sit up as  far as you can in bed or on a chair. Hold the incentive spirometer in an upright position. Breathe out normally. Place the mouthpiece in your mouth and seal your lips tightly around it. Breathe in slowly and as deeply as possible, raising the piston or the ball toward the top of the column. Hold your breath for 3-5 seconds or for as long as possible. Allow the piston or ball to fall to the bottom of the column. Remove the mouthpiece from your mouth and breathe out normally. Rest for a few seconds and repeat Steps 1 through 7 at least 10 times every 1-2 hours when you are awake. Take your time and take a few normal breaths between deep breaths. The spirometer may include an indicator to show your best effort. Use the indicator as a goal to work toward during each repetition. After each set of 10 deep breaths, practice coughing to be sure your lungs are clear. If you have an incision (the cut made at the time of surgery), support your incision when coughing by placing a pillow or rolled up towels firmly against it. Once you are able to get out of bed, walk around indoors and cough well. You may stop using the incentive spirometer when instructed by your caregiver.  RISKS AND COMPLICATIONS Take your time so you do not get dizzy or light-headed. If you are in pain, you may need to take or ask for pain medication before doing incentive spirometry. It is harder to take a deep breath if you are having pain. AFTER USE Rest and breathe slowly and easily. It can be helpful to keep track of a log of your progress. Your caregiver can provide you with a simple table to help with this. If you are using the  spirometer at home, follow these instructions: SEEK MEDICAL CARE IF:  You are having difficultly using the spirometer. You have trouble using the spirometer as often as instructed. Your pain medication is not giving enough relief while using the spirometer. You develop fever of 100.5 F (38.1 C) or  higher. SEEK IMMEDIATE MEDICAL CARE IF:  You cough up bloody sputum that had not been present before. You develop fever of 102 F (38.9 C) or greater. You develop worsening pain at or near the incision site. MAKE SURE YOU:  Understand these instructions. Will watch your condition. Will get help right away if you are not doing well or get worse. Document Released: 10/24/2006 Document Revised: 09/05/2011 Document Reviewed: 12/25/2006 [facility] Patient Information 2014 [place], [place].   ________________________________________________________________________

## 2022-12-28 NOTE — Progress Notes (Addendum)
PCP -Fulton Reek, MD LOV note 09-21-22 on chart, LOV Ronald polite /clearance 09-13-22 on chart  Cardiologist -  Jeri Cos clearance 12-22-22 Carlos Levering, NP   PPM/ICD -  Device Orders -  Rep Notified -   Chest x-ray -  EKG - 03-09-22 epic Stress Test - 2020 ECHO - 2020 Cardiac Cath -   Sleep Study -  CPAP -   Fasting Blood Sugar -  Checks Blood Sugar _____ times a day  Blood Thinner Instructions:Elequis hold 3 days Aspirin Instructions:  ERAS Protcol - PRE-SURGERY Ensure or G2-    COVID vaccine -  Activity-- Anesthesia review: type 1  DM, Afib, HTN, COPD, CKD, PVD  Patient denies shortness of breath, fever, cough and chest pain at PAT appointment   All instructions explained to the patient, with a verbal understanding of the material. Patient agrees to go over the instructions while at home for a better understanding. Patient also instructed to self quarantine after being tested for COVID-19. The opportunity to ask questions was provided.

## 2022-12-28 NOTE — Progress Notes (Signed)
Please place orders in epic pt. Is scheduled for preop 

## 2023-01-02 ENCOUNTER — Ambulatory Visit: Payer: Self-pay | Admitting: Student

## 2023-01-02 DIAGNOSIS — E1122 Type 2 diabetes mellitus with diabetic chronic kidney disease: Secondary | ICD-10-CM

## 2023-01-13 ENCOUNTER — Telehealth: Payer: Self-pay | Admitting: Cardiovascular Disease

## 2023-01-13 ENCOUNTER — Encounter (HOSPITAL_COMMUNITY)
Admission: RE | Admit: 2023-01-13 | Discharge: 2023-01-13 | Disposition: A | Discharge status: Home / Self Care | Payer: Medicare HMO | Source: Ambulatory Visit | Attending: Anesthesiology | Admitting: Anesthesiology

## 2023-01-13 DIAGNOSIS — I10 Essential (primary) hypertension: Secondary | ICD-10-CM

## 2023-01-13 DIAGNOSIS — M87859 Other osteonecrosis, unspecified femur: Secondary | ICD-10-CM | POA: Diagnosis not present

## 2023-01-13 DIAGNOSIS — S72141A Displaced intertrochanteric fracture of right femur, initial encounter for closed fracture: Secondary | ICD-10-CM | POA: Diagnosis not present

## 2023-01-13 DIAGNOSIS — Z79899 Other long term (current) drug therapy: Secondary | ICD-10-CM

## 2023-01-13 DIAGNOSIS — E119 Type 2 diabetes mellitus without complications: Secondary | ICD-10-CM

## 2023-01-13 NOTE — Telephone Encounter (Signed)
Patient's sister is returning call and requesting call back on cell # provided.

## 2023-01-13 NOTE — Telephone Encounter (Signed)
Pt c/o swelling: STAT is pt has developed SOB within 24 hours  How much weight have you gained and in what time span?  About 5 lbs in about 1 week  If swelling, where is the swelling located?  Feet and legs  Are you currently taking a fluid pill?  No   Are you currently SOB?   Do you have a log of your daily weights (if so, list)?   Have you gained 3 pounds in a day or 5 pounds in a week?   Have you traveled recently?  No

## 2023-01-13 NOTE — Telephone Encounter (Signed)
Spoke to the patient, gave verbal permission to speak with her sister Steward Drone. Pt has gained 5 lb in past 1-2 weeks. Patient currently has swelling in BLE and both hands. PCP discontinued most of her hypotension medication , except for half of ramipril. Pt does monitor bp , the bp machine can also tell if the patient is in A-fib. Steward Drone will like to know if pt can resume taken hydrochlorothiazide due to her edema. Pt does not monitor her weight. Pt  was scheduled to have right hip surgery however, it was canceled due to her edema. Advised pt to weight her self daily. Explained ED precautions, pt sister voiced understanding. Will forward to APP for advise.     Blood Pressure   7/17 0753 134/70          0744 118/57 standing          1921 152/59 A-fib          1923 127/59 Standing    7/18 0800 121/48 standing A-fib         0802   96/53 standing          1812 132/54 A-fib          1814 124/64 Standing A-fib

## 2023-01-13 NOTE — Telephone Encounter (Signed)
Attempt to call the patient unable to leave voicemail message.

## 2023-01-13 NOTE — Telephone Encounter (Signed)
Unable to leave voicemail   Phone just rings.

## 2023-01-16 NOTE — Telephone Encounter (Signed)
Patient's sister is calling back but asking that Ariel Cobb gives her a call back. Please advise

## 2023-01-16 NOTE — Telephone Encounter (Signed)
Spoke with Steward Drone and gave her Irving Burton recommendations. She verbalized understanding.

## 2023-01-17 ENCOUNTER — Inpatient Hospital Stay (HOSPITAL_COMMUNITY): Admission: RE | Admit: 2023-01-17 | Payer: Medicare HMO | Source: Ambulatory Visit

## 2023-01-18 ENCOUNTER — Ambulatory Visit (HOSPITAL_COMMUNITY): Admission: RE | Admit: 2023-01-18 | Payer: Medicare HMO | Source: Home / Self Care | Admitting: Orthopedic Surgery

## 2023-01-18 ENCOUNTER — Encounter (HOSPITAL_COMMUNITY): Admission: RE | Payer: Self-pay | Source: Home / Self Care

## 2023-01-18 SURGERY — REMOVAL, HARDWARE
Anesthesia: Spinal | Laterality: Right

## 2023-01-30 ENCOUNTER — Other Ambulatory Visit: Payer: Self-pay

## 2023-01-30 DIAGNOSIS — Z79899 Other long term (current) drug therapy: Secondary | ICD-10-CM

## 2023-02-10 ENCOUNTER — Telehealth: Payer: Self-pay | Admitting: Cardiovascular Disease

## 2023-02-10 DIAGNOSIS — M87851 Other osteonecrosis, right femur: Secondary | ICD-10-CM | POA: Diagnosis not present

## 2023-02-10 DIAGNOSIS — S72141A Displaced intertrochanteric fracture of right femur, initial encounter for closed fracture: Secondary | ICD-10-CM | POA: Diagnosis not present

## 2023-02-10 NOTE — Telephone Encounter (Signed)
Patient sister states meds were stopped to increase her BP prior to a surgery.  They started back on HCTZ due to swelling and her surgery was cancelled due to her swelling.  She states she would like her back on her Metoprolol Succinate.  She states ortho told her today that it would help with the Afib. She is asymptomatic but then daughter states patient is able to tell when in Afib. She is very adamant about noting when her BP machine states Afib.  No HR readings 8/15 125/45*   8/14 108/46 * 8/13 134/46 8/12  141/46* 8/11  134/52 * 8/10  PM  122/64 * 8/9   125/46*    She wants to give approx 5-6 readings per daysitting, and standing. She does not have My chart.  If needs appt can bring the list.

## 2023-02-10 NOTE — Telephone Encounter (Signed)
Called pt to advise her of Dr. Hazle Coca response.She is not on any bp medications right now. The only thing she is on is the ramipril 2.5mg . Her family physician is who took her off of everything but the ramipril and she is back on the hydrochlithorazide. Sister verbalized understanding.  Have her keep a BP log for 2 weeks then F/U with a Pharm D

## 2023-02-10 NOTE — Telephone Encounter (Signed)
Pt c/o medication issue:  1. Name of Medication:  Metoprolol  2. How are you currently taking this medication (dosage and times per day)?   3. Are you having a reaction (difficulty breathing--STAT)?   4. What is your medication issue?   Patient's sister PCP stopped BP medication, Metoprolol and hydrochlorothiazide. She would like to know if the patient can start back on Metoprolol. Please advise.

## 2023-02-14 IMAGING — CT CT HEAD W/O CM
4 series · 16 of 47 positions shown, 18 images · non-contrast
Comparison: None

CLINICAL DATA: Fall on Eliquis

EXAM:
CT HEAD WITHOUT CONTRAST
CT CERVICAL SPINE WITHOUT CONTRAST
TECHNIQUE: Multidetector CT imaging of the head and cervical spine was
performed following the standard protocol without intravenous
contrast. Multiplanar CT image reconstructions of the cervical spine
were also generated.

[Series 3: head without · axial · non-contrast · 0.42mm/px · z∈[+955,+1075]mm · 7 of 32 slices shown, 9 images]
[im 4/32  brain]
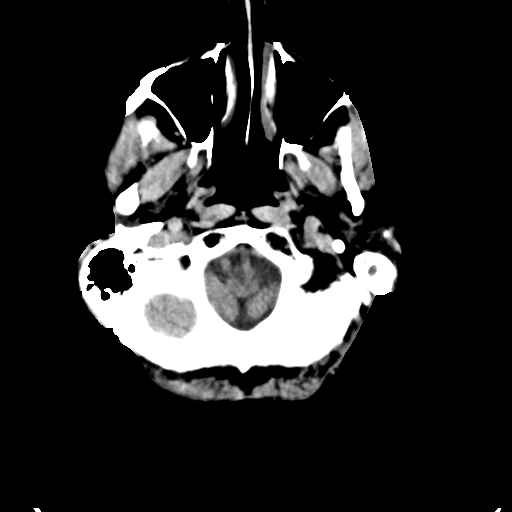
[im 4/32  bone]
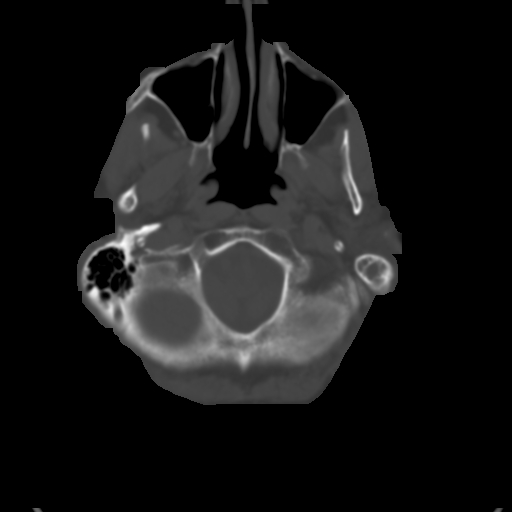
[im 8/32  brain]
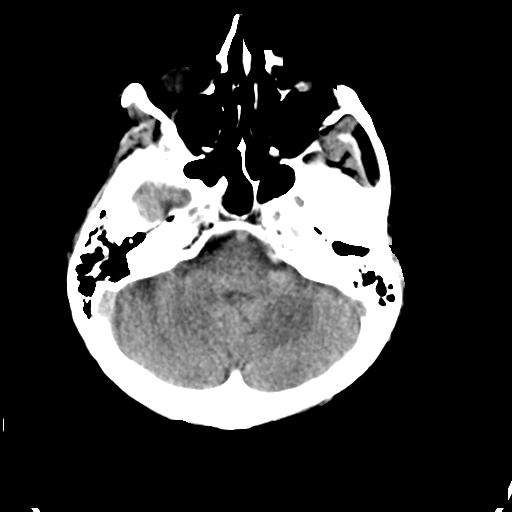
[im 12/32  brain]
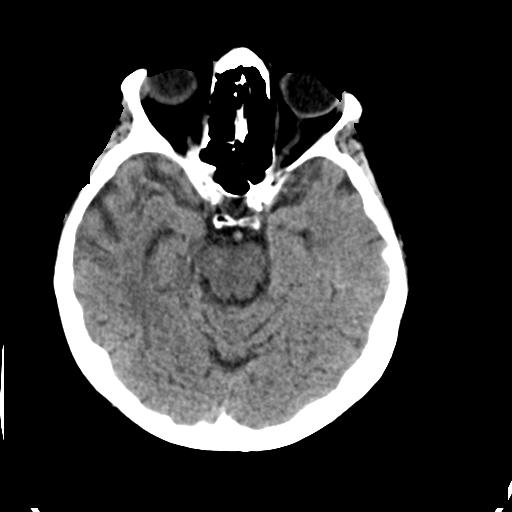
[im 16/32  brain]
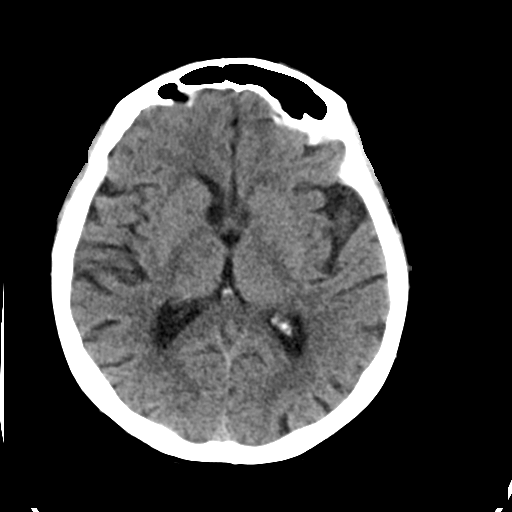
[im 20/32  brain]
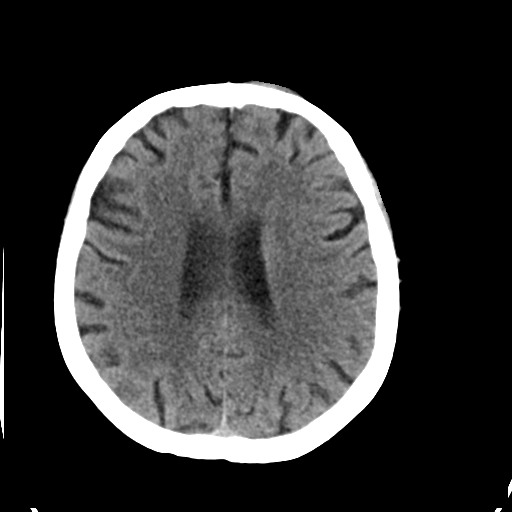
[im 20/32  bone]
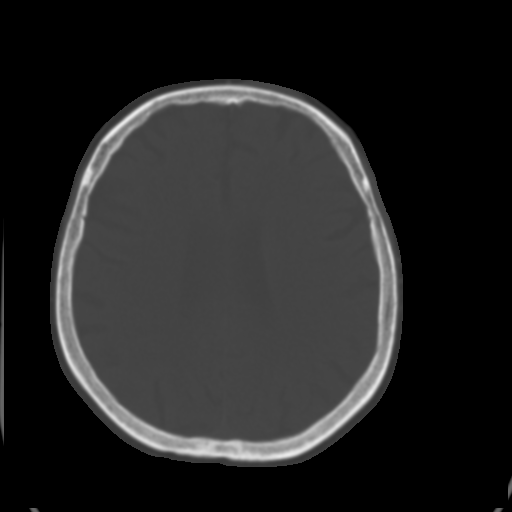
[im 24/32  brain]
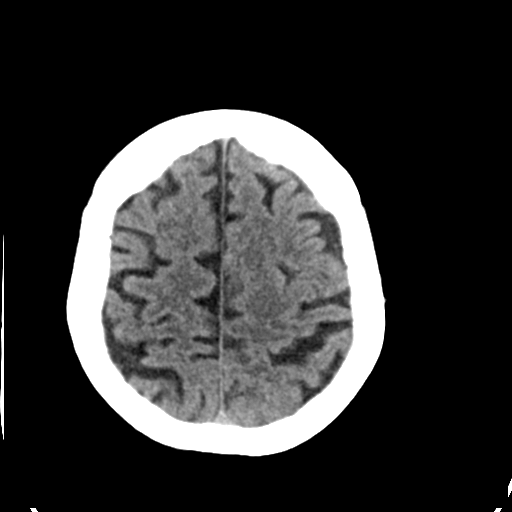
[im 28/32  brain]
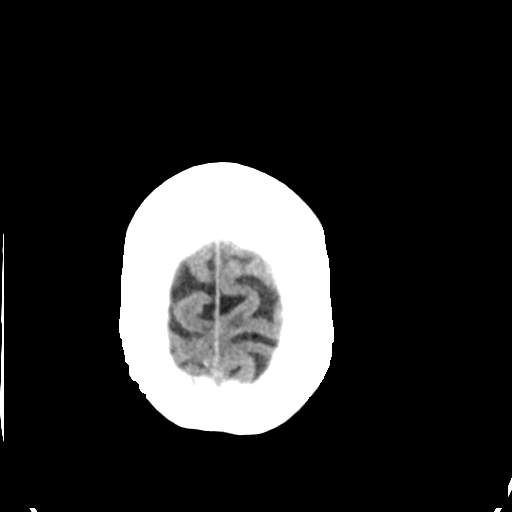

[Series 4: head bone · axial · 0.42mm/px · z∈[+954,+986]mm · 3 of 79 slices shown]
[im 8/79  bone]
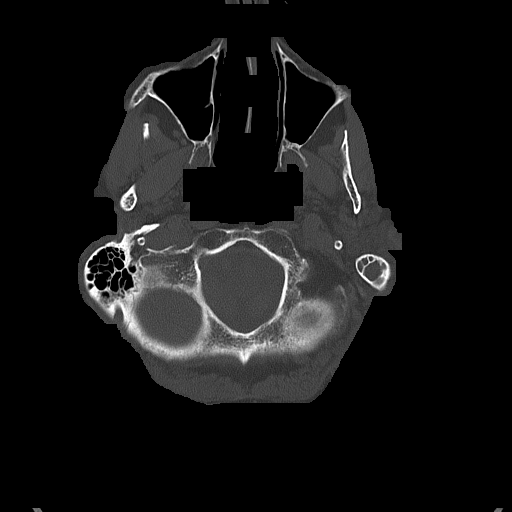
[im 16/79  bone]
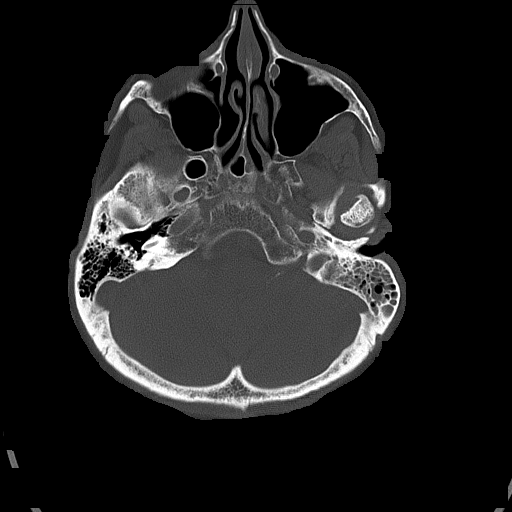
[im 24/79  bone]
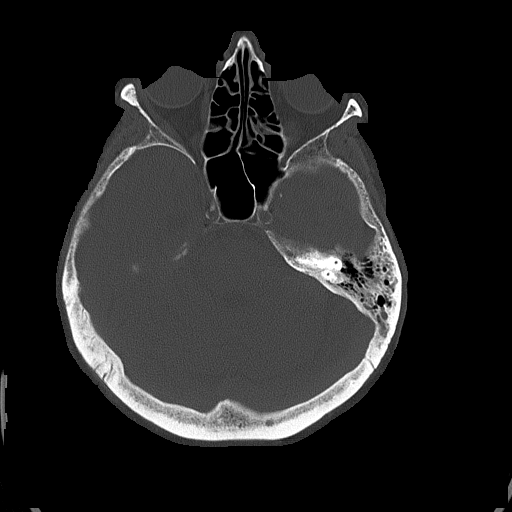

[Series 5: head without cor · coronal · non-contrast · 0.31mm/px · 3 of 67 slices shown]
[im 23/67  brain]
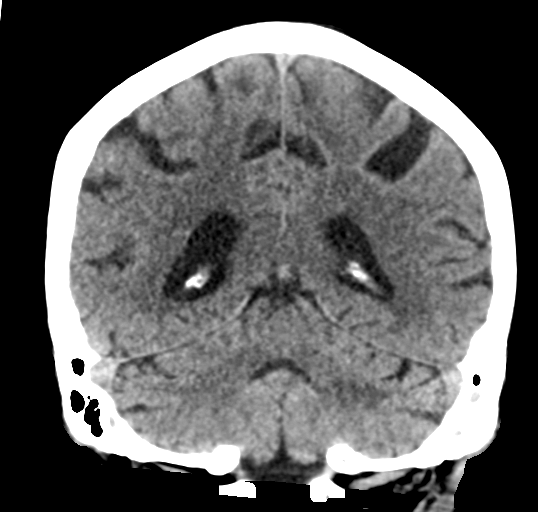
[im 30/67  brain]
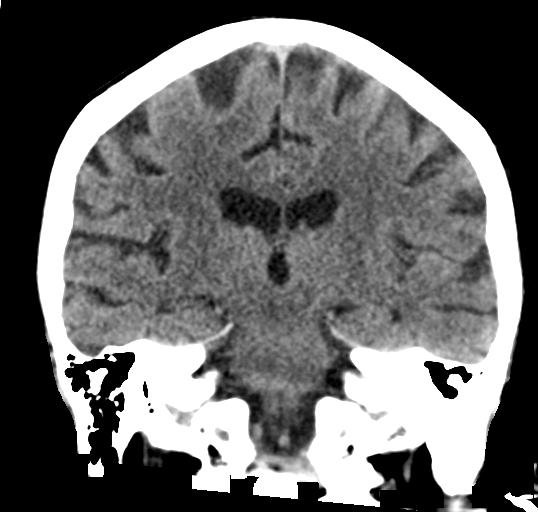
[im 37/67  brain]
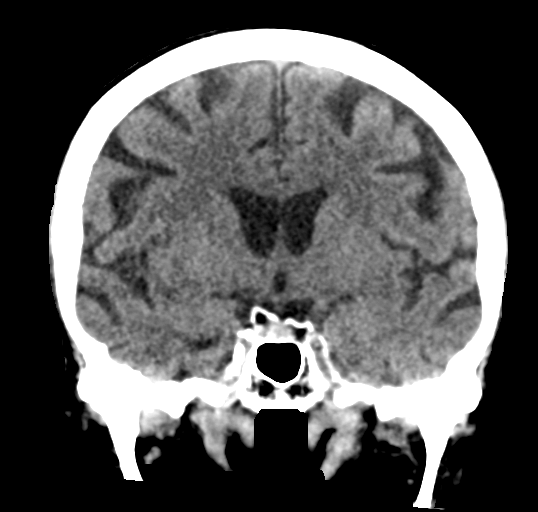

[Series 6: head without sag · sagittal · non-contrast · 0.32mm/px · 3 of 58 slices shown]
[im 20/58  brain]
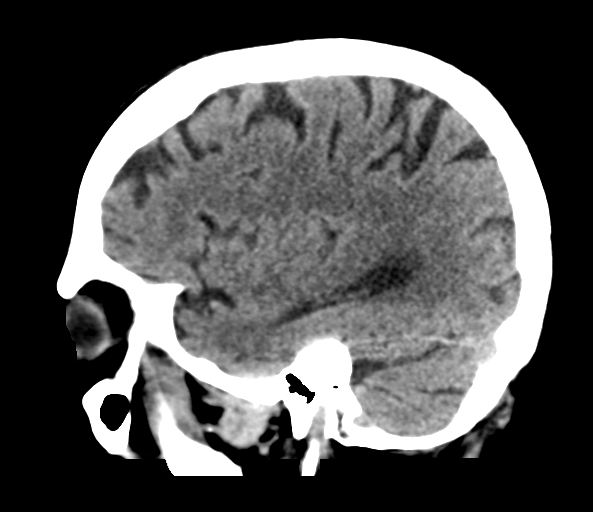
[im 29/58  brain]
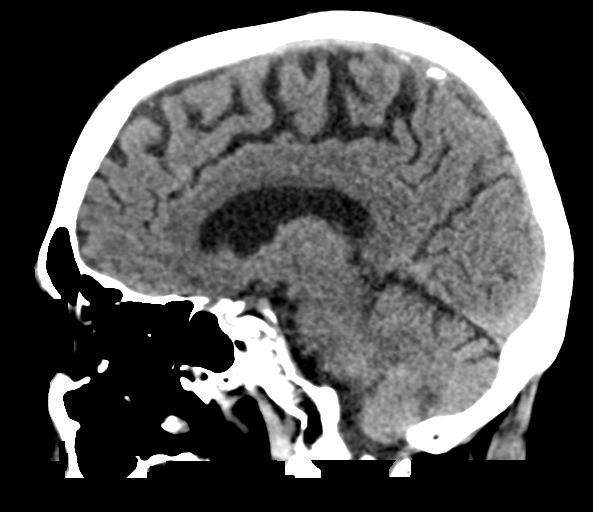
[im 39/58  brain]
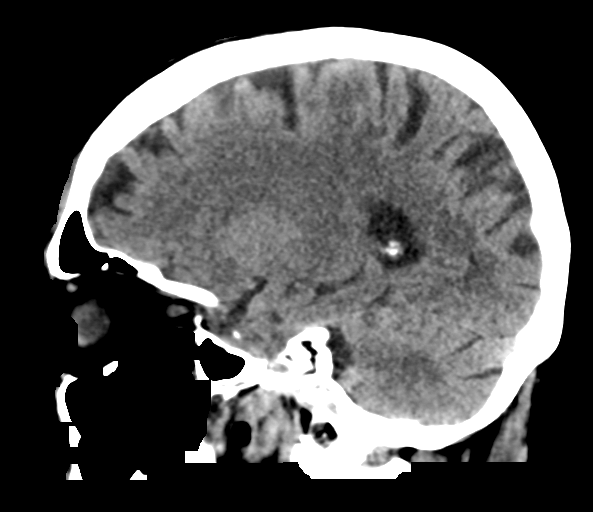

[16 of 47 positions shown; findings below may reference images not displayed]

FINDINGS: CT HEAD FINDINGS

Brain: No acute territorial infarction, hemorrhage or intracranial
mass. The ventricles are nonenlarged. Mild atrophy.

Vascular: No hyperdense vessels. Vertebral and carotid vascular
calcification.

Skull: No fracture

Sinuses/Orbits: No acute finding.  Left mastoid effusion

Other: Small left forehead scalp hematoma.

CT CERVICAL SPINE FINDINGS

Alignment: No subluxation.  Facet alignment within normal limits.

Skull base and vertebrae: No acute fracture. No primary bone lesion
or focal pathologic process.

Soft tissues and spinal canal: No prevertebral fluid or swelling. No
visible canal hematoma.

Disc levels: Moderate disc space narrowing and degenerative change
C4-C5, C5-C6 and C6-C7. Small central disc protrusion C6-C7 with
mild mass effect on the thecal sac. Facet degenerative changes at
multiple levels.

Upper chest: Apical emphysema and scarring

Other: None
IMPRESSION: 1. No CT evidence for acute intracranial abnormality.  Mild atrophy
2. Degenerative changes of the cervical spine. No acute osseous
abnormality.

## 2023-02-26 ENCOUNTER — Other Ambulatory Visit: Payer: Self-pay | Admitting: Cardiovascular Disease

## 2023-02-28 ENCOUNTER — Encounter: Payer: Self-pay | Admitting: Pharmacist Clinician (PhC)/ Clinical Pharmacy Specialist

## 2023-02-28 ENCOUNTER — Ambulatory Visit: Payer: Medicare HMO | Attending: Cardiology | Admitting: Pharmacist Clinician (PhC)/ Clinical Pharmacy Specialist

## 2023-02-28 VITALS — BP 132/52 | HR 70

## 2023-02-28 DIAGNOSIS — I10 Essential (primary) hypertension: Secondary | ICD-10-CM | POA: Diagnosis not present

## 2023-02-28 MED ORDER — RAMIPRIL 2.5 MG PO CAPS: 2.5000 mg | ORAL_CAPSULE | Freq: Every day | ORAL | 3 refills | Status: AC

## 2023-02-28 NOTE — Progress Notes (Signed)
02/28/2023 [name] 1945-07-23 [id number]   HPI:  Ariel Cobb is a 77 y.o. female patient of Dr [name], with a PMH below who presents today for hypertension clinic evaluation.   She has been followed by Dr. [name] for PAD, but at her last visit was noted to have a BP of 172/58.   He asked that she monitor home readings and return in one month for follow up.  I saw her last month, and it was determined that she had somehow stopped the amlodipine prescription.  We had her resume at 5 mg daily.  Since I saw her in November she was scheduled for hip surgery to remove hardware, which would then be followed up with hip replacement after 6 weeks.  The surgery was cancelled because of low blood pressure.  Her antihypertensive medications were all stopped with the exception of ramipril, which was cut from 20 to 5 mg daily.  She then developed some lower extremity edema, so HCTZ 25 mg daily was added back in .    Today she returns for follow up.  She has not had any significant swelling since starting back on the hydrochlorothiazide, and home BP readings have been stable, although diastolic readings have been dropping into the 40's.  At this point her sister says surgery has been permanently cancelled, as in addition to the low BP readings, patient has not quit smoking.    Past Medical History: hyperlipidemia 3/23 LDL 77 on atorvastatin 80  PAD R ABI 0.93, diamondback atherectomy  resolved sx, most recent ABI at 1, with widely patent popliteal artery  DM2 9/23 A1c 6.5 on Lantus 8 u, novolog tid  Tobacco abuse 50 pack year hx, still smoking 1 ppd  PAF CHADS2-VASc = 6, on Eliquis    Blood Pressure Goal:  130/80  Current Medications: ramipril 2.5 mg qd  Social Hx: 1 ppd, no alcohol; coffee in am (about 1/2 cup, takes hours to drink); diet coke 6oz bid    Diet:  admits to being a very picky eater.  Most days has toast with PB for breakfast, toast with pimento cheese and peanuts for lunch;  last night had; grapes; banana and potato chips for dinner; peanut butter and occasional pinto beans are her only source of protein; very few vegetables  Exercise: none  Home BP readings:  home cuff reads just over 10 points lower systolic, diastolic w/in 5 points.    Last 2 weeks (15 readings) average 129/49  (range 116-138/44-53)  Intolerances:  no cardiac medications  Labs: 2/23:  Na 135, K 3.9, Glu 158, BUN 44, SCr 1.56, GFR 34  Wt Readings from Last 3 Encounters:  12/22/22 112 lb 9.6 oz (51.1 kg)  12/06/22 105 lb (47.6 kg)  11/18/22 108 lb (49 kg)   BP Readings from Last 3 Encounters:  02/28/23 (!) 132/52  12/22/22 (!) 160/72  12/06/22 (!) 145/54   Pulse Readings from Last 3 Encounters:  02/28/23 70  12/22/22 71  12/06/22 68    Current Outpatient Medications  Medication Sig Dispense Refill   albuterol (VENTOLIN HFA) 108 (90 Base) MCG/ACT inhaler Inhale 2 puffs into the lungs every 6 (six) hours as needed for wheezing or shortness of breath.     atorvastatin (LIPITOR) 80 MG tablet Take 1 tablet (80 mg total) by mouth every morning. 100 tablet 0   budesonide-formoterol (SYMBICORT) 160-4.5 MCG/ACT inhaler Inhale 2 puffs into the lungs 2 (two) times daily.  clobetasol (TEMOVATE) 0.05 % external solution Apply 1 Application topically 2 (two) times daily as needed (psoriasis).     ELIQUIS 2.5 MG TABS tablet TAKE 1 TABLET BY MOUTH TWICE A DAY 180 tablet 1   hydrochlorothiazide (HYDRODIURIL) 25 MG tablet Take 25 mg by mouth daily.     Insulin Glargine (BASAGLAR KWIKPEN) 100 UNIT/ML Inject 8 Units into the skin daily. afternoon     levothyroxine (SYNTHROID) 100 MCG tablet Take 100 mcg by mouth daily before breakfast.     NOVOLOG FLEXPEN 100 UNIT/ML FlexPen Inject 5-7 Units into the skin See admin instructions. Inject 5 units under the skin in the morning, 6 units ,at lunch and 7 units with dinner  11   ONE TOUCH ULTRA TEST test strip TEST THREE TIMES DAILY E11.22  4   ramipril  (ALTACE) 2.5 MG capsule Take 1 capsule (2.5 mg total) by mouth daily. 90 capsule 3   No current facility-administered medications for this visit.    Allergies  Allergen Reactions   Adhesive [Tape] Itching    Surgical Tape - "makes me itch and break me out" -- HAS TO USE PAPER TAPE   Amoxicillin Hives, Itching and Swelling   Fentanyl Other (See Comments)   Latex Itching   Other Other (See Comments)   Pioglitazone Other (See Comments)    Past Medical History:  Diagnosis Date   A-fib (HCC)    Arthritis    Chronic bronchitis (HCC)    Chronic kidney disease    sees dr sanford yearly stage 3 stable   Claudication (HCC)    Complication of anesthesia 2000   slow to awaken after hip surgery   COPD (chronic obstructive pulmonary disease) (HCC)    Diabetes mellitus without complication (HCC)    type 1   Hyperlipidemia    Hypertension    Hypothyroidism    Impetigo    Neuromuscular disorder (HCC)    peripheral neuropathy   Osteoporosis    PAD (peripheral artery disease) (HCC)    a. 07/29/13: s/p PV angiogram with successful diamondback orbital rotational atherectomy of the high-grade calcified R popliteal artery stenosis   Shortness of breath    on exertion   Thyroid disease     Blood pressure (!) 132/52, pulse 70.   Essential hypertension Assessment: BP is controlled in office BP 132/52 mmHg;   Will need to allow for systolic pressure higher in order to keep diastolic > 50 Tolerates ramipril and hydrochlorothiazide well without any side effects Denies SOB, palpitation, chest pain, headaches,or swelling Reiterated the importance of regular exercise and low salt diet   Plan:  Decrease ramipril to 2.5 mg once daily Patient to keep record of BP readings with heart rate and report to [place] at the next visit Patient to follow up with Dr. [name] in October  Labs ordered today:  none    Phillips Hay PharmD CPP [facility] [address] [place], [place] [address] [phone]

## 2023-02-28 NOTE — Patient Instructions (Signed)
Follow up appointment: with Dr. [name] in October  Take your BP meds as follows:   Decrease ramipril to 2.5 mg once daily  Check your blood pressure at home daily (if able) and keep record of the readings.  Hypertension "High blood pressure"  Hypertension is often called "The Silent Killer." It rarely causes symptoms until it is extremely  high or has done damage to other organs in the body. For this reason, you should have your  blood pressure checked regularly by your physician. We will check your blood pressure  every time you see a provider at one of our offices.   Your blood pressure reading consists of two numbers. Ideally, blood pressure should be  below 120/80. The first ("top") number is called the systolic pressure. It measures the  pressure in your arteries as your heart beats. The second ("bottom") number is called the diastolic pressure. It measures the pressure in your arteries as the heart relaxes between beats.  The benefits of getting your blood pressure under control are enormous. A 10-point  reduction in systolic blood pressure can reduce your risk of stroke by 27% and heart failure by 28%  Your blood pressure goal is < 130/80  To check your pressure at home you will need to:  1. Sit up in a chair, with feet flat on the floor and back supported. Do not cross your ankles or legs. 2. Rest your left arm so that the cuff is about heart level. If the cuff goes on your upper arm,  then just relax the arm on the table, arm of the chair or your lap. If you have a wrist cuff, we  suggest relaxing your wrist against your chest (think of it as Pledging the Flag with the  wrong arm).  3. Place the cuff snugly around your arm, about 1 inch above the crook of your elbow. The  cords should be inside the groove of your elbow.  4. Sit quietly, with the cuff in place, for about 5 minutes. After that 5 minutes press the power  button to start a reading. 5. Do not talk or move  while the reading is taking place.  6. Record your readings on a sheet of paper. Although most cuffs have a memory, it is often  easier to see a pattern developing when the numbers are all in front of you.  7. You can repeat the reading after 1-3 minutes if it is recommended  Make sure your bladder is empty and you have not had caffeine or tobacco within the last 30 min  Always bring your blood pressure log with you to your appointments. If you have not brought your monitor in to be double checked for accuracy, please bring it to your next appointment.  You can find a list of quality blood pressure cuffs at validatebp.org

## 2023-02-28 NOTE — Assessment & Plan Note (Signed)
Assessment: BP is controlled in office BP 132/52 mmHg;   Will need to allow for systolic pressure higher in order to keep diastolic > 50 Tolerates ramipril and hydrochlorothiazide well without any side effects Denies SOB, palpitation, chest pain, headaches,or swelling Reiterated the importance of regular exercise and low salt diet   Plan:  Decrease ramipril to 2.5 mg once daily Patient to keep record of BP readings with heart rate and report to [place] at the next visit Patient to follow up with Dr. [name] in October  Labs ordered today:  none

## 2023-03-02 DIAGNOSIS — Z794 Long term (current) use of insulin: Secondary | ICD-10-CM | POA: Diagnosis not present

## 2023-03-02 DIAGNOSIS — N1832 Chronic kidney disease, stage 3b: Secondary | ICD-10-CM | POA: Diagnosis not present

## 2023-03-02 DIAGNOSIS — E1042 Type 1 diabetes mellitus with diabetic polyneuropathy: Secondary | ICD-10-CM | POA: Diagnosis not present

## 2023-03-02 DIAGNOSIS — E1022 Type 1 diabetes mellitus with diabetic chronic kidney disease: Secondary | ICD-10-CM | POA: Diagnosis not present

## 2023-03-02 DIAGNOSIS — Z720 Tobacco use: Secondary | ICD-10-CM | POA: Diagnosis not present

## 2023-03-02 DIAGNOSIS — R636 Underweight: Secondary | ICD-10-CM | POA: Diagnosis not present

## 2023-03-13 ENCOUNTER — Ambulatory Visit: Payer: Medicare HMO | Admitting: Podiatry

## 2023-03-13 ENCOUNTER — Telehealth: Payer: Self-pay | Admitting: *Deleted

## 2023-03-13 DIAGNOSIS — M87851 Other osteonecrosis, right femur: Secondary | ICD-10-CM | POA: Diagnosis not present

## 2023-03-13 DIAGNOSIS — S72141A Displaced intertrochanteric fracture of right femur, initial encounter for closed fracture: Secondary | ICD-10-CM | POA: Diagnosis not present

## 2023-03-13 NOTE — Telephone Encounter (Signed)
Pre-operative Risk Assessment    Patient Name: Ariel Cobb  DOB: 16-Oct-1945 MRN: [id number]    Last Ov: 09-12-22 [place] New Ov: 04-05-23 [name]   Request for Surgical Clearance    Procedure:   HARDWARE REMOVAL RIGHT HIP   Date of Surgery:  Clearance TBD                                 Surgeon:  DR Rosalita Levan  Surgeon's Group or Practice Name:  [facility] Phone number:  [phone] Fax number:  [phone]   Type of Clearance Requested:   - Pharmacy:  Hold Apixaban (Eliquis)     Type of Anesthesia:  General    Additional requests/questions:   N/A  [name]   03/13/2023, 4:21 PM

## 2023-03-14 ENCOUNTER — Encounter: Payer: Self-pay | Admitting: Podiatry

## 2023-03-14 ENCOUNTER — Ambulatory Visit (INDEPENDENT_AMBULATORY_CARE_PROVIDER_SITE_OTHER): Payer: Medicare HMO | Admitting: Podiatry

## 2023-03-14 DIAGNOSIS — B351 Tinea unguium: Secondary | ICD-10-CM

## 2023-03-14 DIAGNOSIS — E1122 Type 2 diabetes mellitus with diabetic chronic kidney disease: Secondary | ICD-10-CM | POA: Diagnosis not present

## 2023-03-14 DIAGNOSIS — M79675 Pain in left toe(s): Secondary | ICD-10-CM | POA: Diagnosis not present

## 2023-03-14 DIAGNOSIS — M79674 Pain in right toe(s): Secondary | ICD-10-CM | POA: Diagnosis not present

## 2023-03-14 DIAGNOSIS — Z794 Long term (current) use of insulin: Secondary | ICD-10-CM

## 2023-03-14 DIAGNOSIS — E119 Type 2 diabetes mellitus without complications: Secondary | ICD-10-CM

## 2023-03-14 DIAGNOSIS — I739 Peripheral vascular disease, unspecified: Secondary | ICD-10-CM

## 2023-03-14 NOTE — Progress Notes (Signed)
Subjective:  Patient ID: Ariel Cobb, female    DOB: 1946/04/13,   MRN: [id number]  No chief complaint on file.   77 y.o. female presents for concern of thickened elongated and painful nails that are difficult to trim. Requesting to have them trimmed today. She is on blood thinner. Relates burning and tingling in their feet. Patient is diabetic and last A1c was  Lab Results  Component Value Date   HGBA1C 6.0 (H) 11/01/2022   .   PCP:  Renford Dills, MD     PCP:  Renford Dills, MD    . Denies any other pedal complaints. Denies n/v/f/c.   Past Medical History:  Diagnosis Date   A-fib [facility])    Arthritis    Chronic bronchitis (HCC)    Chronic kidney disease    sees dr sanford yearly stage 3 stable   Claudication (HCC)    Complication of anesthesia 2000   slow to awaken after hip surgery   COPD (chronic obstructive pulmonary disease) (HCC)    Diabetes mellitus without complication (HCC)    type 1   Hyperlipidemia    Hypertension    Hypothyroidism    Impetigo    Neuromuscular disorder (HCC)    peripheral neuropathy   Osteoporosis    PAD (peripheral artery disease) (HCC)    a. 07/29/13: s/p PV angiogram with successful diamondback orbital rotational atherectomy of the high-grade calcified R popliteal artery stenosis   Shortness of breath    on exertion   Thyroid disease     Objective:  Physical Exam: Vascular: DP/PT pulses 2/4 bilateral. CFT <3 seconds. Absent hair growth on digits. Edema noted to bilateral lower extremities. Xerosis noted bilaterally.  Skin. No lacerations or abrasions bilateral feet. Nails 1-5 bilateral  are thickened discolored and elongated with subungual debris.  Musculoskeletal: MMT 5/5 bilateral lower extremities in DF, PF, Inversion and Eversion. Deceased ROM in DF of ankle joint.  Neurological: Sensation intact to light touch. Protective sensation intact bilateral.    Assessment:   1. Pain due to onychomycosis of toenails of both feet    2. PAD (peripheral artery disease) (HCC)   3. Type 2 diabetes mellitus with stage 3b chronic kidney disease, with long-term current use of insulin (HCC)        Plan:  Patient was evaluated and treated and all questions answered. -Discussed and educated patient on diabetic foot care, especially with  regards to the vascular, neurological and musculoskeletal systems.  -Stressed the importance of good glycemic control and the detriment of not  controlling glucose levels in relation to the foot. -Discussed supportive shoes at all times and checking feet regularly.  -Mechanically debrided all nails 1-5 bilateral using sterile nail nipper and filed with dremel without incident  -Answered all patient questions -Patient to return  in 3 months for at risk foot care -Patient advised to call the office if any problems or questions arise in the meantime.    Louann Sjogren, DPM

## 2023-03-28 ENCOUNTER — Other Ambulatory Visit: Payer: Self-pay | Admitting: Cardiovascular Disease

## 2023-03-28 DIAGNOSIS — I480 Paroxysmal atrial fibrillation: Secondary | ICD-10-CM

## 2023-04-05 ENCOUNTER — Ambulatory Visit: Payer: Medicare HMO | Attending: Cardiovascular Disease | Admitting: Cardiovascular Disease

## 2023-04-05 ENCOUNTER — Encounter: Payer: Self-pay | Admitting: Cardiovascular Disease

## 2023-04-05 VITALS — BP 112/52 | HR 68 | Ht 65.0 in | Wt 110.8 lb

## 2023-04-05 DIAGNOSIS — I480 Paroxysmal atrial fibrillation: Secondary | ICD-10-CM | POA: Diagnosis not present

## 2023-04-05 DIAGNOSIS — I739 Peripheral vascular disease, unspecified: Secondary | ICD-10-CM | POA: Diagnosis not present

## 2023-04-05 DIAGNOSIS — I10 Essential (primary) hypertension: Secondary | ICD-10-CM | POA: Diagnosis not present

## 2023-04-05 DIAGNOSIS — E1169 Type 2 diabetes mellitus with other specified complication: Secondary | ICD-10-CM

## 2023-04-05 DIAGNOSIS — E782 Mixed hyperlipidemia: Secondary | ICD-10-CM | POA: Diagnosis not present

## 2023-04-05 DIAGNOSIS — F17210 Nicotine dependence, cigarettes, uncomplicated: Secondary | ICD-10-CM

## 2023-04-05 DIAGNOSIS — Z01818 Encounter for other preprocedural examination: Secondary | ICD-10-CM

## 2023-04-05 NOTE — Progress Notes (Signed)
04/05/2023 Ariel Cobb   12/05/1945  [id number]  Primary Physician Renford Dills, MD Primary Cardiologist: Runell Gess MD Milagros Loll, [place], [place]  HPI:  Ariel Cobb is a 77 y.o.   thin appearing widowed Caucasian female no children and who is accompanied by her Sister [name] who is also a patient of mine.  She now lives with her sister and nephew.  I last saw her in the office 03/09/2022. Marland KitchenShe was referred by Dr. Cristie Hem at [facility] for evaluation and treatment of claudication. Her primary care physician is Dr. Rebecka Apley at [facility]. Her cardiovascular risk factors include a 50-pack-year history tobacco abuse currently smoking one pack per day, 2 hypertension, diabetes and hyperlipidemia. Her father died suddenly of microinfarction at age 53. She has never had a heart attack or stroke patient is compared to sleep denies chest pain. She complains of right calf claudication with recent arterial Dopplers performed in our office on 04/25/13 revealing a right ABI 0.93 with a high-frequency signal in the right popliteal artery. She underwent angiography on 12/9 revealing a high-grade right popliteal artery stenosis with three-vessel runoff. Because of moderate renal deficiency her intervention was staged until today.I performed angiography and intervention on her 2 /2/15. She had a 99% calcified lesion in the right popliteal artery but I performed diamondback orbital rotational atherectomy on. Her symptoms of claudication resolved after her procedure and her Dopplers normalized. Her most recent Dopplers performed 02/05/14 revealed ABIs of 1 bilaterally with a widely patent right popliteal artery.   Since I saw her in the office almost 2 years ago she is done well.  She still denies claudication.  Her recent lower extremity arterial Doppler studies performed in July of last year revealed normal ABIs with moderate increased velocities in the distal right SFA.  She was  complaining of some tachypalpitations and had a monitor performed in our office 09/20/2018 that showed episodes of PSVT, nonsustained ventricular tachycardia and PAF.  She did have blood work performed by her PCP that showed a TSH 2.03 suggesting that she was hyperthyroid.  Her thyroid replacement therapy was down titrated.  Since that time she no longer is experiencing palpitations.   When I saw her 3 months ago she was in A. fib with a ventricular spots of 78.  We talked about starting apixaban.  It turns out that she was hyperthyroid and her Synthroid dose was adjusted.  She was also complaining of jaw and chest pain and has since had a negative Myoview and 2D echo.   She wore  a monitor which resulted on 04/15/2019 which showed episodes of PAF with RVR, SVT and nonsustained ventricular tachycardia.  Based on this, I decided  to put her back on her Eliquis oral anticoagulation, I discontinue her aspirin..  She is on Synthroid replacement and is followed by Dr. Sharl Ma, her endocrinologist.   Since I saw her in the office a year ago she continues to do well.  She still smoking one half of a pack a day recalcitrant to factor modification.  She denies chest pain but does complain of some shortness of breath.  She apparently broke her hip this past February and is slowly recuperating from surgical ORIF.   Current Meds  Medication Sig   albuterol (VENTOLIN HFA) 108 (90 Base) MCG/ACT inhaler Inhale 2 puffs into the lungs every 6 (six) hours as needed for wheezing or shortness of breath.   atorvastatin (LIPITOR) 80 MG  tablet TAKE 1 TABLET BY MOUTH EVERY MORNING.   budesonide-formoterol (SYMBICORT) 160-4.5 MCG/ACT inhaler Inhale 2 puffs into the lungs 2 (two) times daily.   ELIQUIS 2.5 MG TABS tablet TAKE 1 TABLET BY MOUTH TWICE A DAY   hydrochlorothiazide (HYDRODIURIL) 25 MG tablet Take 25 mg by mouth daily.   levothyroxine (SYNTHROID) 100 MCG tablet Take 100 mcg by mouth daily before breakfast.   ramipril  (ALTACE) 2.5 MG capsule Take 1 capsule (2.5 mg total) by mouth daily.     Allergies  Allergen Reactions   Adhesive [Tape] Itching    Surgical Tape - "makes me itch and break me out" -- HAS TO USE PAPER TAPE   Amoxicillin Hives, Itching and Swelling   Fentanyl Other (See Comments)   Latex Itching   Other Other (See Comments)   Pioglitazone Other (See Comments)    Social History   Socioeconomic History   Marital status: Widowed    Spouse name: Not on file   Number of children: Not on file   Years of education: Not on file   Highest education level: Not on file  Occupational History   Not on file  Tobacco Use   Smoking status: Every Day    Current packs/day: 1.00    Average packs/day: 1 pack/day for 50.0 years (50.0 ttl pk-yrs)    Types: Cigarettes   Smokeless tobacco: Never  Vaping Use   Vaping status: Never Used  Substance and Sexual Activity   Alcohol use: No   Drug use: No   Sexual activity: Never  Other Topics Concern   Not on file  Social History Narrative   Not on file   Social Determinants of Health   Financial Resource Strain: Not on file  Food Insecurity: Not on file  Transportation Needs: Not on file  Physical Activity: Not on file  Stress: Not on file  Social Connections: Not on file  Intimate Partner Violence: Not on file     Review of Systems: General: negative for chills, fever, night sweats or weight changes.  Cardiovascular: negative for chest pain, dyspnea on exertion, edema, orthopnea, palpitations, paroxysmal nocturnal dyspnea or shortness of breath Dermatological: negative for rash Respiratory: negative for cough or wheezing Urologic: negative for hematuria Abdominal: negative for nausea, vomiting, diarrhea, bright red blood per rectum, melena, or hematemesis Neurologic: negative for visual changes, syncope, or dizziness All other systems reviewed and are otherwise negative except as noted above.    Blood pressure (!) 112/52, pulse 68,  height 5\' 5"  (1.651 m), weight 110 lb 12.8 oz (50.3 kg).  General appearance: alert and no distress Neck: no adenopathy, no carotid bruit, no JVD, supple, symmetrical, trachea midline, and thyroid not enlarged, symmetric, no tenderness/mass/nodules Lungs: clear to auscultation bilaterally Heart: regular rate and rhythm, S1, S2 normal, no murmur, click, rub or gallop Extremities: extremities normal, atraumatic, no cyanosis or edema Pulses: 2+ and symmetric Skin: Skin color, texture, turgor normal. No rashes or lesions Neurologic: Grossly normal  EKG EKG Interpretation Date/Time:  Wednesday April 05 2023 10:56:58 EDT Ventricular Rate:  68 PR Interval:  156 QRS Duration:  78 QT Interval:  394 QTC Calculation: 418 R Axis:   78  Text Interpretation: Sinus rhythm with Premature atrial complexes When compared with ECG of 05-Aug-2021 19:47, PREVIOUS ECG IS PRESENT Confirmed by Nanetta Batty [phone]) on 04/05/2023 11:10:23 AM    ASSESSMENT AND PLAN:   PAD (peripheral artery disease) (HCC) History of peripheral arterial disease status post peripheral angiography by myself  07/29/2013 treated with orbital atherectomy and PTA resulting in marked improvement of her symptoms and normalization of her Dopplers.  She gets Doppler studies performed annually most recently 02/15/2022 revealing a patent right popliteal artery.  She really denies claudication but does have difficulty ambulating because of right hip pain.  She uses a walker.  She needs a right total hip replacement by Dr. Linna Caprice.  We will get appropriate test to clear her.  Essential hypertension History of essential hypertension blood pressure measured today at 112/52.  She is on hydrochlorothiazide and low-dose ramipril.  She was on metoprolol in the past which was discontinued and she no longer needs.  Mixed hyperlipidemia due to type 2 diabetes mellitus (HCC) History of hyperlipidemia on high-dose statin therapy with lipid profile  performed 04/13/2022 revealing total cholesterol 138, LDL of 75 and HDL 48.  Paroxysmal atrial fibrillation (HCC) History of PAF maintaining sinus rhythm on Eliquis oral anticoagulation.  Nicotine dependence, cigarettes, uncomplicated Ongoing tobacco abuse of 1/2 pack/day recalcitrant to risk factor modification.     Runell Gess MD FACP,FACC,FAHA, [facility] 04/05/2023 11:25 AM

## 2023-04-05 NOTE — Assessment & Plan Note (Signed)
History of hyperlipidemia on high-dose statin therapy with lipid profile performed 04/13/2022 revealing total cholesterol 138, LDL of 75 and HDL 48.

## 2023-04-05 NOTE — Assessment & Plan Note (Signed)
History of PAF maintaining sinus rhythm on Eliquis oral anticoagulation. 

## 2023-04-05 NOTE — Assessment & Plan Note (Signed)
History of peripheral arterial disease status post peripheral angiography by myself 07/29/2013 treated with orbital atherectomy and PTA resulting in marked improvement of her symptoms and normalization of her Dopplers.  She gets Doppler studies performed annually most recently 02/15/2022 revealing a patent right popliteal artery.  She really denies claudication but does have difficulty ambulating because of right hip pain.  She uses a walker.  She needs a right total hip replacement by Dr. Linna Caprice.  We will get appropriate test to clear her.

## 2023-04-05 NOTE — Assessment & Plan Note (Signed)
History of essential hypertension blood pressure measured today at 112/52.  She is on hydrochlorothiazide and low-dose ramipril.  She was on metoprolol in the past which was discontinued and she no longer needs.

## 2023-04-05 NOTE — Patient Instructions (Signed)
Medication Instructions:  Your physician recommends that you continue on your current medications as directed. Please refer to the Current Medication list given to you today.  *If you need a refill on your cardiac medications before your next appointment, please call your pharmacy*   Testing/Procedures: Your physician has requested that you have an echocardiogram. Echocardiography is a painless test that uses sound waves to create images of your heart. It provides your doctor with information about the size and shape of your heart and how well your heart's chambers and valves are working. This procedure takes approximately one hour. There are no restrictions for this procedure. Please do NOT wear cologne, perfume, aftershave, or lotions (deodorant is allowed). Please arrive 15 minutes prior to your appointment time. This will take place at [address] [place]   Your physician has requested that you have a lexiscan myoview. For further information please visit [email or web address] Please follow instruction sheet, as given. This will take place at [address], [address]  How to prepare for your Myocardial Perfusion Test: Do not eat or drink 3 hours prior to your test, except you may have water. Do not consume products containing caffeine (regular or decaffeinated) 12 hours prior to your test. (ex: coffee, chocolate, sodas, tea). Do bring a list of your current medications with you.  If not listed below, you may take your medications as normal. Do wear comfortable clothes (no dresses or overalls) and walking shoes, tennis shoes preferred (No heels or open toe shoes are allowed). Do NOT wear cologne, perfume, aftershave, or lotions (deodorant is allowed). The test will take approximately 3 to 4 hours to complete If these instructions are not followed, your test will have to be rescheduled.    Follow-Up: At [facility], you and your health needs are our priority.  As part  of our continuing mission to provide you with exceptional heart care, we have created designated Provider Care Teams.  These Care Teams include your primary Cardiologist (physician) and Advanced Practice Providers (APPs -  Physician Assistants and Nurse Practitioners) who all work together to provide you with the care you need, when you need it.  We recommend signing up for the patient portal called "MyChart".  Sign up information is provided on this After Visit Summary.  MyChart is used to connect with patients for Virtual Visits (Telemedicine).  Patients are able to view lab/test results, encounter notes, upcoming appointments, etc.  Non-urgent messages can be sent to your provider as well.   To learn more about what you can do with MyChart, go to [email or web address].    Your next appointment:   12 month(s)  Provider:   Nanetta Batty, MD

## 2023-04-05 NOTE — Assessment & Plan Note (Signed)
Ongoing tobacco abuse of 1/2 pack/day recalcitrant to risk factor modification. 

## 2023-04-13 ENCOUNTER — Telehealth (HOSPITAL_COMMUNITY): Payer: Self-pay

## 2023-04-18 ENCOUNTER — Ambulatory Visit (HOSPITAL_COMMUNITY): Payer: Medicare HMO | Attending: Cardiovascular Disease

## 2023-04-18 DIAGNOSIS — I480 Paroxysmal atrial fibrillation: Secondary | ICD-10-CM | POA: Insufficient documentation

## 2023-04-18 DIAGNOSIS — I739 Peripheral vascular disease, unspecified: Secondary | ICD-10-CM | POA: Diagnosis not present

## 2023-04-18 DIAGNOSIS — F17210 Nicotine dependence, cigarettes, uncomplicated: Secondary | ICD-10-CM | POA: Diagnosis not present

## 2023-04-18 DIAGNOSIS — Z01810 Encounter for preprocedural cardiovascular examination: Secondary | ICD-10-CM | POA: Diagnosis not present

## 2023-04-18 DIAGNOSIS — E782 Mixed hyperlipidemia: Secondary | ICD-10-CM | POA: Diagnosis not present

## 2023-04-18 DIAGNOSIS — I10 Essential (primary) hypertension: Secondary | ICD-10-CM | POA: Insufficient documentation

## 2023-04-18 DIAGNOSIS — E1169 Type 2 diabetes mellitus with other specified complication: Secondary | ICD-10-CM | POA: Insufficient documentation

## 2023-04-18 DIAGNOSIS — Z01818 Encounter for other preprocedural examination: Secondary | ICD-10-CM | POA: Diagnosis not present

## 2023-04-18 MED ORDER — TECHNETIUM TC 99M TETROFOSMIN IV KIT
8.9000 | PACK | Freq: Once | INTRAVENOUS | Status: AC | PRN
  Administered 2023-04-18: 8.9 via INTRAVENOUS

## 2023-04-18 MED ORDER — REGADENOSON 0.4 MG/5ML IV SOLN
0.4000 mg | Freq: Once | INTRAVENOUS | Status: AC
Start: 2023-04-18 — End: 2023-04-18
  Administered 2023-04-18: 0.4 mg via INTRAVENOUS

## 2023-04-18 MED ORDER — TECHNETIUM TC 99M TETROFOSMIN IV KIT
26.4000 | PACK | Freq: Once | INTRAVENOUS | Status: AC | PRN
  Administered 2023-04-18: 26.4 via INTRAVENOUS

## 2023-04-19 LAB — MYOCARDIAL PERFUSION IMAGING
LV dias vol: 70 mL (ref 46–106)
LV sys vol: 18 mL
Nuc Stress EF: 74 %
Peak HR: 109 {beats}/min
Rest HR: 62 {beats}/min
Rest Nuclear Isotope Dose: 8.9 mCi
SDS: 2
SRS: 0
SSS: 2
ST Depression (mm): 0 mm
Stress Nuclear Isotope Dose: 26.4 mCi
TID: 0.9

## 2023-05-03 ENCOUNTER — Ambulatory Visit (HOSPITAL_COMMUNITY): Payer: Medicare HMO | Attending: Cardiovascular Disease

## 2023-05-03 DIAGNOSIS — E1169 Type 2 diabetes mellitus with other specified complication: Secondary | ICD-10-CM | POA: Insufficient documentation

## 2023-05-03 DIAGNOSIS — Z01818 Encounter for other preprocedural examination: Secondary | ICD-10-CM | POA: Diagnosis not present

## 2023-05-03 DIAGNOSIS — E782 Mixed hyperlipidemia: Secondary | ICD-10-CM | POA: Diagnosis not present

## 2023-05-03 DIAGNOSIS — I480 Paroxysmal atrial fibrillation: Secondary | ICD-10-CM | POA: Insufficient documentation

## 2023-05-03 DIAGNOSIS — I10 Essential (primary) hypertension: Secondary | ICD-10-CM | POA: Diagnosis not present

## 2023-05-03 DIAGNOSIS — I739 Peripheral vascular disease, unspecified: Secondary | ICD-10-CM | POA: Insufficient documentation

## 2023-05-03 DIAGNOSIS — F17210 Nicotine dependence, cigarettes, uncomplicated: Secondary | ICD-10-CM | POA: Diagnosis not present

## 2023-05-03 DIAGNOSIS — Z01810 Encounter for preprocedural cardiovascular examination: Secondary | ICD-10-CM | POA: Diagnosis not present

## 2023-05-03 LAB — ECHOCARDIOGRAM COMPLETE
Area-P 1/2: 3.03 cm2
S' Lateral: 2.8 cm

## 2023-05-08 ENCOUNTER — Telehealth: Payer: Self-pay | Admitting: Cardiovascular Disease

## 2023-05-08 NOTE — Telephone Encounter (Signed)
Received message from Dr. [name]:    Cardiac pumping function was normal.  The patient's heart is a little bit stiff probably based on her age.  No changes in her current treatment.   _________________________________________________________________ Patient identification verified by 2 forms. Marilynn Rail, RN   Called and spoke to patient  Relayed provider message  Patient states:   -needs recent results sent to Ortho for pre-op clearance  Advised patient to obtain fax number to have fax sent or have ortho office check care everywhere for results as well Patient verbalized understanding, no questions at this time

## 2023-05-08 NOTE — Telephone Encounter (Signed)
Dr. [name]   Patient inquiring about recent Echo result  RN aware result note available, are you able to further clarify results and possible treatment/plan as patient may inquire   Thank you

## 2023-05-08 NOTE — Telephone Encounter (Signed)
Pt returning call for echo results  

## 2023-05-09 DIAGNOSIS — N1832 Chronic kidney disease, stage 3b: Secondary | ICD-10-CM | POA: Diagnosis not present

## 2023-05-09 DIAGNOSIS — I10 Essential (primary) hypertension: Secondary | ICD-10-CM | POA: Diagnosis not present

## 2023-05-09 DIAGNOSIS — E1022 Type 1 diabetes mellitus with diabetic chronic kidney disease: Secondary | ICD-10-CM | POA: Diagnosis not present

## 2023-05-09 NOTE — Telephone Encounter (Addendum)
Patient with diagnosis of afib on Eliquis for anticoagulation.    Procedure: HARDWARE REMOVAL RIGHT HIP  Date of procedure: TBD   CHA2DS2-VASc Score = 6   This indicates a 9.7% annual risk of stroke. The patient's score is based upon: CHF History: 0 HTN History: 1 Diabetes History: 1 Stroke History: 0 Vascular Disease History: 1 Age Score: 2 Gender Score: 1      CrCl 27 ml/min Platelet count 299  Per office protocol, patient can hold Eliquis for 3.5 days prior to procedure.    **This guidance is not considered finalized until pre-operative APP has relayed final recommendations.**

## 2023-05-09 NOTE — Telephone Encounter (Signed)
Pharmacy please advise on holding Eliquis prior to hardware removal of right hip scheduled for TBD. Thank you.

## 2023-05-10 NOTE — Telephone Encounter (Signed)
   Patient Name: Ariel Cobb  DOB: Aug 12, 1945 MRN: [id number]  Primary Cardiologist: Nanetta Batty, MD  Chart reviewed as part of pre-operative protocol coverage. Given past medical history and time since last visit, based on ACC/AHA guidelines, Ariel Cobb is at acceptable risk for the planned procedure without further cardiovascular testing.   Patient with diagnosis of afib on Eliquis for anticoagulation.     Procedure: HARDWARE REMOVAL RIGHT HIP  Date of procedure: TBD     CHA2DS2-VASc Score = 6   This indicates a 9.7% annual risk of stroke. The patient's score is based upon: CHF History: 0 HTN History: 1 Diabetes History: 1 Stroke History: 0 Vascular Disease History: 1 Age Score: 2 Gender Score: 1       CrCl 27 ml/min Platelet count 299   Per office protocol, patient can hold Eliquis for 3.5 days prior to procedure.      Joni Reining, NP 05/10/2023, 7:05 AM

## 2023-05-11 ENCOUNTER — Encounter (HOSPITAL_BASED_OUTPATIENT_CLINIC_OR_DEPARTMENT_OTHER): Payer: Self-pay

## 2023-05-31 NOTE — Progress Notes (Signed)
Sent message, via epic in basket, requesting orders in epic from surgeon.  

## 2023-06-01 ENCOUNTER — Ambulatory Visit: Payer: Self-pay | Admitting: Student

## 2023-06-01 DIAGNOSIS — Z794 Long term (current) use of insulin: Secondary | ICD-10-CM

## 2023-06-02 ENCOUNTER — Other Ambulatory Visit: Payer: Self-pay | Admitting: Cardiovascular Disease

## 2023-06-02 NOTE — Telephone Encounter (Signed)
Prescription refill request for Eliquis received. Indication:afib Last office visit:10/24 Scr:1.29  8/24 Age: 77 Weight:49.9  kg  Prescription refilled

## 2023-06-05 DIAGNOSIS — Z720 Tobacco use: Secondary | ICD-10-CM | POA: Diagnosis not present

## 2023-06-05 DIAGNOSIS — E039 Hypothyroidism, unspecified: Secondary | ICD-10-CM | POA: Diagnosis not present

## 2023-06-05 DIAGNOSIS — E1042 Type 1 diabetes mellitus with diabetic polyneuropathy: Secondary | ICD-10-CM | POA: Diagnosis not present

## 2023-06-05 DIAGNOSIS — N1832 Chronic kidney disease, stage 3b: Secondary | ICD-10-CM | POA: Diagnosis not present

## 2023-06-05 DIAGNOSIS — E1022 Type 1 diabetes mellitus with diabetic chronic kidney disease: Secondary | ICD-10-CM | POA: Diagnosis not present

## 2023-06-05 DIAGNOSIS — Z794 Long term (current) use of insulin: Secondary | ICD-10-CM | POA: Diagnosis not present

## 2023-06-06 NOTE — Patient Instructions (Signed)
DUE TO COVID-19 ONLY TWO VISITORS  (aged 79 and older)  ARE ALLOWED TO COME WITH YOU AND STAY IN THE WAITING ROOM ONLY DURING PRE OP AND PROCEDURE.   **NO VISITORS ARE ALLOWED IN THE SHORT STAY AREA OR RECOVERY ROOM!!**  IF YOU WILL BE ADMITTED INTO THE HOSPITAL YOU ARE ALLOWED ONLY FOUR SUPPORT PEOPLE DURING VISITATION HOURS ONLY (7 AM -8PM)   The support person(s) must pass our screening, gel in and out, and wear a mask at all times, including in the patient's room. Patients must also wear a mask when staff or their support person are in the room. Visitors GUEST BADGE MUST BE WORN VISIBLY  One adult visitor may remain with you overnight and MUST be in the room by 8 P.M.     Your procedure is scheduled on: 06/04/23   Report to [facility] Main Entrance    Report to admitting at : 9:00 AM   Call this number if you have problems the morning of surgery [phone]   Do not eat food :After Midnight.   After Midnight you may have the following liquids until : 8:00 AM DAY OF SURGERY  Water Black Coffee (sugar ok, NO MILK/CREAM OR CREAMERS)  Tea (sugar ok, NO MILK/CREAM OR CREAMERS) regular and decaf                             Plain Jell-O (NO RED)                                           Fruit ices (not with fruit pulp, NO RED)                                     Popsicles (NO RED)                                                                  Juice: apple, WHITE grape, WHITE cranberry Sports drinks like Gatorade (NO RED)   The day of surgery:  Drink ONE (1) Pre-Surgery Clear G2 at : 8:00 AM the morning of surgery. Drink in one sitting. Do not sip.  This drink was given to you during your hospital  pre-op appointment visit. Nothing else to drink after completing the  Pre-Surgery Clear Ensure or G2.          If you have questions, please contact your surgeon's office.  FOLLOW ANY ADDITIONAL PRE OP INSTRUCTIONS YOU RECEIVED FROM YOUR SURGEON'S OFFICE!!!   Oral Hygiene  is also important to reduce your risk of infection.                                    Remember - BRUSH YOUR TEETH THE MORNING OF SURGERY WITH YOUR REGULAR TOOTHPASTE  DENTURES WILL BE REMOVED PRIOR TO SURGERY PLEASE DO NOT APPLY "Poly grip" OR ADHESIVES!!!   Do NOT smoke after Midnight   Take these medicines the morning of surgery  with A SIP OF WATER: levothyroxine.Use inhalers as usual. How to Manage Your Diabetes Before and After Surgery  Why is it important to control my blood sugar before and after surgery? Improving blood sugar levels before and after surgery helps healing and can limit problems. A way of improving blood sugar control is eating a healthy diet by:  Eating less sugar and carbohydrates  Increasing activity/exercise  Talking with your doctor about reaching your blood sugar goals High blood sugars (greater than 180 mg/dL) can raise your risk of infections and slow your recovery, so you will need to focus on controlling your diabetes during the weeks before surgery. Make sure that the doctor who takes care of your diabetes knows about your planned surgery including the date and location.  How do I manage my blood sugar before surgery? Check your blood sugar at least 4 times a day, starting 2 days before surgery, to make sure that the level is not too high or low. Check your blood sugar the morning of your surgery when you wake up and every 2 hours until you get to the Short Stay unit. If your blood sugar is less than 70 mg/dL, you will need to treat for low blood sugar: Do not take insulin. Treat a low blood sugar (less than 70 mg/dL) with  cup of clear juice (cranberry or apple), 4 glucose tablets, OR glucose gel. Recheck blood sugar in 15 minutes after treatment (to make sure it is greater than 70 mg/dL). If your blood sugar is not greater than 70 mg/dL on recheck, call [phone] for further instructions. Report your blood sugar to the short stay nurse when you get  to Short Stay.  If you are admitted to the hospital after surgery: Your blood sugar will be checked by the staff and you will probably be given insulin after surgery (instead of oral diabetes medicines) to make sure you have good blood sugar levels. The goal for blood sugar control after surgery is 80-180 mg/dL.   WHAT DO I DO ABOUT MY DIABETES MEDICATION?  Do not take oral diabetes medicines (pills) the morning of surgery.  THE NIGHT BEFORE SURGERY, DO NOT take novolog insulin at night time.     THE MORNING OF SURGERY, take ONLY half of the basaglar insulin dose ,  If your CBG is greater than 220 mg/dL, you may take  of your sliding scale  (correction) dose of insulin.    Bring CPAP mask and tubing day of surgery.                              You may not have any metal on your body including hair pins, jewelry, and body piercing             Do not wear make-up, lotions, powders, perfumes/cologne, or deodorant  Do not wear nail polish including gel and S&S, artificial/acrylic nails, or any other type of covering on natural nails including finger and toenails. If you have artificial nails, gel coating, etc. that needs to be removed by a nail salon please have this removed prior to surgery or surgery may need to be canceled/ delayed if the surgeon/ anesthesia feels like they are unable to be safely monitored.   Do not shave  48 hours prior to surgery.    Do not bring valuables to the hospital. [facility] IS NOT  RESPONSIBLE   FOR VALUABLES.   Contacts, glasses, or bridgework may not be worn into surgery.   Bring small overnight bag day of surgery.   DO NOT BRING YOUR HOME MEDICATIONS TO THE HOSPITAL. PHARMACY WILL DISPENSE MEDICATIONS LISTED ON YOUR MEDICATION LIST TO YOU DURING YOUR ADMISSION IN THE HOSPITAL!    Patients discharged on the day of surgery will not be allowed to drive home.  Someone NEEDS to stay with you for the first 24 hours after  anesthesia.   Special Instructions: Bring a copy of your healthcare power of attorney and living will documents         the day of surgery if you haven't scanned them before.              Please read over the following fact sheets you were given: IF YOU HAVE QUESTIONS ABOUT YOUR PRE-OP INSTRUCTIONS PLEASE CALL [phone]    [facility] - Preparing for Surgery Before surgery, you can play an important role.  Because skin is not sterile, your skin needs to be as free of germs as possible.  You can reduce the number of germs on your skin by washing with CHG (chlorahexidine gluconate) soap before surgery.  CHG is an antiseptic cleaner which kills germs and bonds with the skin to continue killing germs even after washing. Please DO NOT use if you have an allergy to CHG or antibacterial soaps.  If your skin becomes reddened/irritated stop using the CHG and inform your nurse when you arrive at Short Stay. Do not shave (including legs and underarms) for at least 48 hours prior to the first CHG shower.  You may shave your face/neck. Please follow these instructions carefully:  1.  Shower with CHG Soap the night before surgery and the  morning of Surgery.  2.  If you choose to wash your hair, wash your hair first as usual with your  normal  shampoo.  3.  After you shampoo, rinse your hair and body thoroughly to remove the  shampoo.                           4.  Use CHG as you would any other liquid soap.  You can apply chg directly  to the skin and wash                       Gently with a scrungie or clean washcloth.  5.  Apply the CHG Soap to your body ONLY FROM THE NECK DOWN.   Do not use on face/ open                           Wound or open sores. Avoid contact with eyes, ears mouth and genitals (private parts).                       Wash face,  Genitals (private parts) with your normal soap.             6.  Wash thoroughly, paying special attention to the area where your surgery  will be  performed.  7.  Thoroughly rinse your body with warm water from the neck down.  8.  DO NOT shower/wash with your normal soap after using and rinsing off  the CHG Soap.  9.  Pat yourself dry with a clean towel.            10.  Wear clean pajamas.            11.  Place clean sheets on your bed the night of your first shower and do not  sleep with pets. Day of Surgery : Do not apply any lotions/deodorants the morning of surgery.  Please wear clean clothes to the hospital/surgery center.  FAILURE TO FOLLOW THESE INSTRUCTIONS MAY RESULT IN THE CANCELLATION OF YOUR SURGERY PATIENT SIGNATURE_________________________________  NURSE SIGNATURE__________________________________  ________________________________________________________________________  Rogelia Mire  An incentive spirometer is a tool that can help keep your lungs clear and active. This tool measures how well you are filling your lungs with each breath. Taking long deep breaths may help reverse or decrease the chance of developing breathing (pulmonary) problems (especially infection) following: A long period of time when you are unable to move or be active. BEFORE THE PROCEDURE  If the spirometer includes an indicator to show your best effort, your nurse or respiratory therapist will set it to a desired goal. If possible, sit up straight or lean slightly forward. Try not to slouch. Hold the incentive spirometer in an upright position. INSTRUCTIONS FOR USE  Sit on the edge of your bed if possible, or sit up as far as you can in bed or on a chair. Hold the incentive spirometer in an upright position. Breathe out normally. Place the mouthpiece in your mouth and seal your lips tightly around it. Breathe in slowly and as deeply as possible, raising the piston or the ball toward the top of the column. Hold your breath for 3-5 seconds or for as long as possible. Allow the piston or ball to fall to the bottom of the  column. Remove the mouthpiece from your mouth and breathe out normally. Rest for a few seconds and repeat Steps 1 through 7 at least 10 times every 1-2 hours when you are awake. Take your time and take a few normal breaths between deep breaths. The spirometer may include an indicator to show your best effort. Use the indicator as a goal to work toward during each repetition. After each set of 10 deep breaths, practice coughing to be sure your lungs are clear. If you have an incision (the cut made at the time of surgery), support your incision when coughing by placing a pillow or rolled up towels firmly against it. Once you are able to get out of bed, walk around indoors and cough well. You may stop using the incentive spirometer when instructed by your caregiver.  RISKS AND COMPLICATIONS Take your time so you do not get dizzy or light-headed. If you are in pain, you may need to take or ask for pain medication before doing incentive spirometry. It is harder to take a deep breath if you are having pain. AFTER USE Rest and breathe slowly and easily. It can be helpful to keep track of a log of your progress. Your caregiver can provide you with a simple table to help with this. If you are using the spirometer at home, follow these instructions: SEEK MEDICAL CARE IF:  You are having difficultly using the spirometer. You have trouble using the spirometer as often as instructed. Your pain medication is not giving enough relief while using the spirometer. You develop fever of 100.5 F (38.1 C) or higher. SEEK IMMEDIATE MEDICAL CARE IF:  You cough up bloody sputum that had not been  present before. You develop fever of 102 F (38.9 C) or greater. You develop worsening pain at or near the incision site. MAKE SURE YOU:  Understand these instructions. Will watch your condition. Will get help right away if you are not doing well or get worse. Document Released: 10/24/2006 Document Revised: 09/05/2011  Document Reviewed: 12/25/2006 [facility] Patient Information 2014 [place], [place].   ________________________________________________________________________

## 2023-06-07 ENCOUNTER — Encounter (HOSPITAL_COMMUNITY)
Admission: RE | Admit: 2023-06-07 | Discharge: 2023-06-07 | Disposition: A | Discharge status: Home / Self Care | Payer: Medicare HMO | Source: Ambulatory Visit | Attending: Orthopedic Surgery | Admitting: Orthopedic Surgery

## 2023-06-07 ENCOUNTER — Encounter (HOSPITAL_COMMUNITY): Payer: Self-pay

## 2023-06-07 ENCOUNTER — Other Ambulatory Visit: Payer: Self-pay

## 2023-06-07 VITALS — BP 129/93 | HR 57 | Temp 97.7°F | Ht 65.0 in | Wt 109.0 lb

## 2023-06-07 DIAGNOSIS — E1051 Type 1 diabetes mellitus with diabetic peripheral angiopathy without gangrene: Secondary | ICD-10-CM | POA: Diagnosis not present

## 2023-06-07 DIAGNOSIS — E1122 Type 2 diabetes mellitus with diabetic chronic kidney disease: Secondary | ICD-10-CM

## 2023-06-07 DIAGNOSIS — N1832 Chronic kidney disease, stage 3b: Secondary | ICD-10-CM | POA: Diagnosis not present

## 2023-06-07 DIAGNOSIS — M87051 Idiopathic aseptic necrosis of right femur: Secondary | ICD-10-CM | POA: Diagnosis not present

## 2023-06-07 DIAGNOSIS — Z794 Long term (current) use of insulin: Secondary | ICD-10-CM | POA: Insufficient documentation

## 2023-06-07 DIAGNOSIS — J449 Chronic obstructive pulmonary disease, unspecified: Secondary | ICD-10-CM | POA: Diagnosis not present

## 2023-06-07 DIAGNOSIS — I4891 Unspecified atrial fibrillation: Secondary | ICD-10-CM | POA: Insufficient documentation

## 2023-06-07 DIAGNOSIS — I10 Essential (primary) hypertension: Secondary | ICD-10-CM

## 2023-06-07 DIAGNOSIS — E1022 Type 1 diabetes mellitus with diabetic chronic kidney disease: Secondary | ICD-10-CM | POA: Insufficient documentation

## 2023-06-07 DIAGNOSIS — F17210 Nicotine dependence, cigarettes, uncomplicated: Secondary | ICD-10-CM | POA: Diagnosis not present

## 2023-06-07 DIAGNOSIS — I129 Hypertensive chronic kidney disease with stage 1 through stage 4 chronic kidney disease, or unspecified chronic kidney disease: Secondary | ICD-10-CM | POA: Diagnosis not present

## 2023-06-07 DIAGNOSIS — Z01812 Encounter for preprocedural laboratory examination: Secondary | ICD-10-CM | POA: Insufficient documentation

## 2023-06-07 HISTORY — DX: Depression, unspecified: F32.A

## 2023-06-07 HISTORY — DX: Cardiac arrhythmia, unspecified: I49.9

## 2023-06-07 LAB — BASIC METABOLIC PANEL
Anion gap: 9 (ref 5–15)
BUN: 45 mg/dL — ABNORMAL HIGH (ref 8–23)
CO2: 25 mmol/L (ref 22–32)
Calcium: 9.2 mg/dL (ref 8.9–10.3)
Chloride: 105 mmol/L (ref 98–111)
Creatinine, Ser: 1.49 mg/dL — ABNORMAL HIGH (ref 0.44–1.00)
GFR, Estimated: 36 mL/min — ABNORMAL LOW (ref >60)
Glucose, Bld: 75 mg/dL (ref 70–99)
Potassium: 3.3 mmol/L — ABNORMAL LOW (ref 3.5–5.1)
Sodium: 139 mmol/L (ref 135–145)

## 2023-06-07 LAB — CBC
HCT: 33.7 % — ABNORMAL LOW (ref 36.0–46.0)
Hemoglobin: 11 g/dL — ABNORMAL LOW (ref 12.0–15.0)
MCH: 32.4 pg (ref 26.0–34.0)
MCHC: 32.6 g/dL (ref 30.0–36.0)
MCV: 99.4 fL (ref 80.0–100.0)
Platelets: 421 10*3/uL — ABNORMAL HIGH (ref 150–400)
RBC: 3.39 MIL/uL — ABNORMAL LOW (ref 3.87–5.11)
RDW: 14.6 % (ref 11.5–15.5)
WBC: 11 10*3/uL — ABNORMAL HIGH (ref 4.0–10.5)
nRBC: 0 % (ref 0.0–0.2)

## 2023-06-07 LAB — HEMOGLOBIN A1C
Hgb A1c MFr Bld: 5.9 % — ABNORMAL HIGH (ref 4.8–5.6)
Mean Plasma Glucose: 122.63 mg/dL

## 2023-06-07 LAB — GLUCOSE, CAPILLARY: Glucose-Capillary: 69 mg/dL — ABNORMAL LOW (ref 70–99)

## 2023-06-07 NOTE — Progress Notes (Addendum)
For Anesthesia: PCP - Renford Dills, MD  Cardiologist - Runell Gess, MD : Clearance: 04/05/23  Bowel Prep reminder:  Chest x-ray -  EKG - 04/05/23 Stress Test -  ECHO - 05/03/23 Cardiac Cath -  Pacemaker/ICD device last checked: Pacemaker orders received: Device Rep notified:  Spinal Cord Stimulator:  Sleep Study - N/A CPAP -   Fasting Blood Sugar - 140's Checks Blood Sugar __4___ times a day Date and result of last Hgb A1c- 6.0: 11/01/22  Last dose of GLP1 agonist- N/A GLP1 instructions:   Last dose of SGLT-2 inhibitors- N/A SGLT-2 instructions:   Blood Thinner Instructions: Eliquis will be hold after: 06/10/23 Aspirin Instructions: Last Dose:  Activity level: Can go up a flight of stairs and activities of daily living without stopping and without chest pain and/or shortness of breath      Unable to go up a flight of stairs  due to hip fracture.   Anesthesia review: Hx: DIA,HTN,Afib,Smoker,CKD III,COPD,PAD  Patient denies shortness of breath, fever, cough and chest pain at PAT appointment   Patient verbalized understanding of instructions that were given to them at the PAT appointment. Patient was also instructed that they will need to review over the PAT instructions again at home before surgery.

## 2023-06-09 NOTE — Anesthesia Preprocedure Evaluation (Signed)
Anesthesia Evaluation  Patient identified by MRN, date of birth, ID band Patient awake    Reviewed: Allergy & Precautions, Patient's Chart, lab work & pertinent test results  Airway Mallampati: II  TM Distance: >3 FB Neck ROM: Full    Dental no notable dental hx. (+) Teeth Intact, Dental Advisory Given   Pulmonary shortness of breath, COPD,  COPD inhaler, Current SmokerPatient did not abstain from smoking.   Pulmonary exam normal breath sounds clear to auscultation       Cardiovascular hypertension, Pt. on medications + Peripheral Vascular Disease  Normal cardiovascular exam+ dysrhythmias (on eliquis) Atrial Fibrillation  Rhythm:Regular Rate:Normal  Echo 05/03/2023 1. Left ventricular ejection fraction, by estimation, is 60 to 65%. The  left ventricle has normal function. The left ventricle has no regional  wall motion abnormalities. Left ventricular diastolic parameters are  consistent with Grade II diastolic  dysfunction (pseudonormalization). Elevated left ventricular end-diastolic  pressure.   2. Right ventricular systolic function is normal. The right ventricular  size is normal.   3. The mitral valve is abnormal. No evidence of mitral valve  regurgitation. No evidence of mitral stenosis.   4. The aortic valve is tricuspid. There is mild calcification of the  aortic valve. Aortic valve regurgitation is not visualized. Aortic valve  sclerosis is present, with no evidence of aortic valve stenosis.   5. The inferior vena cava is normal in size with greater than 50%  respiratory variability, suggesting right atrial pressure of 3 mmHg.       Neuro/Psych  PSYCHIATRIC DISORDERS  Depression     Neuromuscular disease    GI/Hepatic   Endo/Other  diabetesHypothyroidism    Renal/GU Renal InsufficiencyRenal disease     Musculoskeletal  (+) Arthritis , Osteoarthritis,    Abdominal   Peds  Hematology Lab Results       Component                Value               Date                      WBC                      11.0 (H)            06/07/2023                HGB                      11.0 (L)            06/07/2023                HCT                      33.7 (L)            06/07/2023                MCV                      99.4                06/07/2023                PLT  421 (H)             06/07/2023              Anesthesia Other Findings   Reproductive/Obstetrics                              Anesthesia Physical Anesthesia Plan  ASA: 3  Anesthesia Plan: Spinal   Post-op Pain Management: Precedex and Ofirmev IV (intra-op)*   Induction: Intravenous  PONV Risk Score and Plan: 3 and Treatment may vary due to age or medical condition and Ondansetron  Airway Management Planned: Mask, Natural Airway and Nasal Cannula  Additional Equipment: None  Intra-op Plan:   Post-operative Plan:   Informed Consent: I have reviewed the patients History and Physical, chart, labs and discussed the procedure including the risks, benefits and alternatives for the proposed anesthesia with the patient or authorized representative who has indicated his/her understanding and acceptance.     Dental advisory given  Plan Discussed with: Anesthesiologist and CRNA  Anesthesia Plan Comments: (See PAT note 06/07/23  Spinal w Propafol infusion )        Anesthesia Quick Evaluation

## 2023-06-09 NOTE — Progress Notes (Signed)
Anesthesia Chart Review   Case: [id number] Date/Time: 06/14/23 1332   Procedure: HARDWARE REMOVAL (Right)   Anesthesia type: Choice   Pre-op diagnosis: avascular necrosis right hip , retained hardware   Location: [facility] ROOM 07 / [facility]   Surgeons: Samson Frederic, MD       DISCUSSION:77 y.o. smoker with h/o HTN, COPD, Type 1 diabetes, CKD Stage III, atrial fibrillation, PAD, AVN right hip with retained hardware scheduled for above procedure 06/14/2023 with Dr. Samson Frederic.   Per cardiology preoperative evaluation 05/10/2023, "Chart reviewed as part of pre-operative protocol coverage. Given past medical history and time since last visit, based on ACC/AHA guidelines, Ariel Cobb is at acceptable risk for the planned procedure without further cardiovascular testing.    Patient with diagnosis of afib on Eliquis for anticoagulation.   Per office protocol, patient can hold Eliquis for 3.5 days prior to procedure. "  Clearance received from PCP which states pt is cleared for upcoming surgery.   VS: BP (!) 129/93   Pulse (!) 57   Temp 36.5 C (Oral)   Ht 5\' 5"  (1.651 m)   Wt 49.4 kg   SpO2 100%   BMI 18.14 kg/m   PROVIDERS: Renford Dills, MD is PCP   Cardiologist - [name] Delton See, MD  LABS: Labs reviewed: Acceptable for surgery. (all labs ordered are listed, but only abnormal results are displayed)  Labs Reviewed  BASIC METABOLIC PANEL - Abnormal; Notable for the following components:      Result Value   Potassium 3.3 (*)    BUN 45 (*)    Creatinine, Ser 1.49 (*)    GFR, Estimated 36 (*)    All other components within normal limits  CBC - Abnormal; Notable for the following components:   WBC 11.0 (*)    RBC 3.39 (*)    Hemoglobin 11.0 (*)    HCT 33.7 (*)    Platelets 421 (*)    All other components within normal limits  HEMOGLOBIN A1C - Abnormal; Notable for the following components:   Hgb A1c MFr Bld 5.9 (*)    All other components within normal limits   GLUCOSE, CAPILLARY - Abnormal; Notable for the following components:   Glucose-Capillary 69 (*)    All other components within normal limits     IMAGES:   EKG:   CV: Echo 05/03/2023 1. Left ventricular ejection fraction, by estimation, is 60 to 65%. The  left ventricle has normal function. The left ventricle has no regional  wall motion abnormalities. Left ventricular diastolic parameters are  consistent with Grade II diastolic  dysfunction (pseudonormalization). Elevated left ventricular end-diastolic  pressure.   2. Right ventricular systolic function is normal. The right ventricular  size is normal.   3. The mitral valve is abnormal. No evidence of mitral valve  regurgitation. No evidence of mitral stenosis.   4. The aortic valve is tricuspid. There is mild calcification of the  aortic valve. Aortic valve regurgitation is not visualized. Aortic valve  sclerosis is present, with no evidence of aortic valve stenosis.   5. The inferior vena cava is normal in size with greater than 50%  respiratory variability, suggesting right atrial pressure of 3 mmHg.   Myocardial Perfusion 04/18/23   LV perfusion is normal. There is no evidence of ischemia. There is no evidence of infarction. Apical thinning artifact.   Left ventricular function is normal. Nuclear stress EF: 74%. The left ventricular ejection fraction is hyperdynamic (>65%). End diastolic cavity  size is normal.   The study is normal. The study is low risk.   Past Medical History:  Diagnosis Date   A-fib (HCC)    Anemia    Arthritis    Chronic bronchitis (HCC)    Chronic kidney disease    sees dr sanford yearly stage 3 stable   Claudication (HCC)    Complication of anesthesia 2000   slow to awaken after hip surgery   COPD (chronic obstructive pulmonary disease) (HCC)    Depression    Diabetes mellitus without complication (HCC)    type 1   Dysrhythmia    Hyperlipidemia    Hypertension    Hypothyroidism     Impetigo    Neuromuscular disorder (HCC)    peripheral neuropathy   Osteoporosis    PAD (peripheral artery disease) (HCC)    a. 07/29/13: s/p PV angiogram with successful diamondback orbital rotational atherectomy of the high-grade calcified R popliteal artery stenosis   Shortness of breath    on exertion   Thyroid disease     Past Surgical History:  Procedure Laterality Date   ABDOMINAL AORTAGRAM  06/04/2013   Procedure: ABDOMINAL AORTAGRAM;  Surgeon: Runell Gess, MD;  Location: [facility] CATH LAB;  Service: Cardiovascular;;   ATHERECTOMY Right 07/29/2013   popliteal/notes 07/29/2013   CATARACT EXTRACTION, BILATERAL Bilateral    CHOLECYSTECTOMY  1980's?   COLONOSCOPY WITH PROPOFOL N/A 09/06/2016   Procedure: COLONOSCOPY WITH PROPOFOL;  Surgeon: Charolett Bumpers, MD;  Location: [facility];  Service: Endoscopy;  Laterality: N/A;   cyst from right hand     ESOPHAGOGASTRODUODENOSCOPY (EGD) WITH PROPOFOL N/A 09/06/2016   Procedure: ESOPHAGOGASTRODUODENOSCOPY (EGD) WITH PROPOFOL;  Surgeon: Charolett Bumpers, MD;  Location: [facility];  Service: Endoscopy;  Laterality: N/A;   HIP FRACTURE SURGERY Left 2000's   "I broke the ball; dr broke femur during OR; got a rod in there" 92/07/2013)   INTRAMEDULLARY (IM) NAIL INTERTROCHANTERIC Right 08/06/2021   Procedure: INTRAMEDULLARY (IM) NAIL INTERTROCHANTRIC;  Surgeon: Roby Lofts, MD;  Location: [facility];  Service: Orthopedics;  Laterality: Right;   LOWER EXTREMITY ANGIOGRAM N/A 06/04/2013   Procedure: LOWER EXTREMITY ANGIOGRAM;  Surgeon: Runell Gess, MD;  Location: [facility] CATH LAB;  Service: Cardiovascular;  Laterality: N/A;    MEDICATIONS:  acetaminophen (TYLENOL) 500 MG tablet   albuterol (VENTOLIN HFA) 108 (90 Base) MCG/ACT inhaler   atorvastatin (LIPITOR) 80 MG tablet   budesonide-formoterol (SYMBICORT) 160-4.5 MCG/ACT inhaler   clobetasol (TEMOVATE) 0.05 % external solution   ELIQUIS 2.5 MG TABS tablet   hydrochlorothiazide  (HYDRODIURIL) 25 MG tablet   Insulin Glargine (BASAGLAR KWIKPEN) 100 UNIT/ML   levothyroxine (SYNTHROID) 100 MCG tablet   NOVOLOG FLEXPEN 100 UNIT/ML FlexPen   ONE TOUCH ULTRA TEST test strip   ramipril (ALTACE) 2.5 MG capsule   No current facility-administered medications for this encounter.    Jodell Cipro Ward, PA-C [facility] [phone]

## 2023-06-13 ENCOUNTER — Ambulatory Visit: Payer: Self-pay | Admitting: Student

## 2023-06-13 ENCOUNTER — Encounter: Payer: Self-pay | Admitting: Podiatry

## 2023-06-13 ENCOUNTER — Ambulatory Visit: Payer: Medicare HMO | Admitting: Podiatry

## 2023-06-13 DIAGNOSIS — I739 Peripheral vascular disease, unspecified: Secondary | ICD-10-CM | POA: Diagnosis not present

## 2023-06-13 DIAGNOSIS — M79675 Pain in left toe(s): Secondary | ICD-10-CM

## 2023-06-13 DIAGNOSIS — Z794 Long term (current) use of insulin: Secondary | ICD-10-CM

## 2023-06-13 DIAGNOSIS — N1832 Chronic kidney disease, stage 3b: Secondary | ICD-10-CM

## 2023-06-13 DIAGNOSIS — M79674 Pain in right toe(s): Secondary | ICD-10-CM

## 2023-06-13 DIAGNOSIS — B351 Tinea unguium: Secondary | ICD-10-CM

## 2023-06-13 DIAGNOSIS — E1122 Type 2 diabetes mellitus with diabetic chronic kidney disease: Secondary | ICD-10-CM

## 2023-06-13 NOTE — H&P (Signed)
PREOPERATIVE H&P  Chief Complaint: right hip pain  HPI: Ariel Cobb is a 77 y.o. female who presents for preoperative history and physical with a diagnosis of retained hardware and avascular necrosis of her right hip. Symptoms are rated as moderate to severe, and have been worsening.  This is significantly impairing activities of daily living.  She has elected for surgical management.   Past Medical History:  Diagnosis Date   A-fib (HCC)    Anemia    Arthritis    Chronic bronchitis (HCC)    Chronic kidney disease    sees dr sanford yearly stage 3 stable   Claudication (HCC)    Complication of anesthesia 2000   slow to awaken after hip surgery   COPD (chronic obstructive pulmonary disease) (HCC)    Depression    Diabetes mellitus without complication (HCC)    type 1   Dysrhythmia    Hyperlipidemia    Hypertension    Hypothyroidism    Impetigo    Neuromuscular disorder (HCC)    peripheral neuropathy   Osteoporosis    PAD (peripheral artery disease) (HCC)    a. 07/29/13: s/p PV angiogram with successful diamondback orbital rotational atherectomy of the high-grade calcified R popliteal artery stenosis   Shortness of breath    on exertion   Thyroid disease    Past Surgical History:  Procedure Laterality Date   ABDOMINAL AORTAGRAM  06/04/2013   Procedure: ABDOMINAL AORTAGRAM;  Surgeon: Runell Gess, MD;  Location: [facility] CATH LAB;  Service: Cardiovascular;;   ATHERECTOMY Right 07/29/2013   popliteal/notes 07/29/2013   CATARACT EXTRACTION, BILATERAL Bilateral    CHOLECYSTECTOMY  1980's?   COLONOSCOPY WITH PROPOFOL N/A 09/06/2016   Procedure: COLONOSCOPY WITH PROPOFOL;  Surgeon: Charolett Bumpers, MD;  [facility];  Service: Endoscopy;  Laterality: N/A;   cyst from right hand     ESOPHAGOGASTRODUODENOSCOPY (EGD) WITH PROPOFOL N/A 09/06/2016   Procedure: ESOPHAGOGASTRODUODENOSCOPY (EGD) WITH PROPOFOL;  Surgeon: Charolett Bumpers, MD;  [facility];   Service: Endoscopy;  Laterality: N/A;   HIP FRACTURE SURGERY Left 2000's   "I broke the ball; dr broke femur during OR; got a rod in there" 92/07/2013)   INTRAMEDULLARY (IM) NAIL INTERTROCHANTERIC Right 08/06/2021   Procedure: INTRAMEDULLARY (IM) NAIL INTERTROCHANTRIC;  Surgeon: Roby Lofts, MD;  Location: [facility];  Service: Orthopedics;  Laterality: Right;   LOWER EXTREMITY ANGIOGRAM N/A 06/04/2013   Procedure: LOWER EXTREMITY ANGIOGRAM;  Surgeon: Runell Gess, MD;  Location: [facility] CATH LAB;  Service: Cardiovascular;  Laterality: N/A;   Social History   Socioeconomic History   Marital status: Widowed    Spouse name: Not on file   Number of children: Not on file   Years of education: Not on file   Highest education level: Not on file  Occupational History   Not on file  Tobacco Use   Smoking status: Every Day    Current packs/day: 1.00    Average packs/day: 1 pack/day for 50.0 years (50.0 ttl pk-yrs)    Types: Cigarettes   Smokeless tobacco: Never  Vaping Use   Vaping status: Never Used  Substance and Sexual Activity   Alcohol use: No   Drug use: No   Sexual activity: Never  Other Topics Concern   Not on file  Social History Narrative   Not on file   Social Drivers of Health   Financial Resource Strain: Not on file  Food Insecurity: Not on file  Transportation Needs: Not  on file  Physical Activity: Not on file  Stress: Not on file  Social Connections: Not on file   Family History  Problem Relation Age of Onset   Heart disease Father    Allergies  Allergen Reactions   Adhesive [Tape] Itching    Surgical Tape - "makes me itch and break me out" -- HAS TO USE PAPER TAPE   Amoxicillin Hives, Itching and Swelling   Fentanyl Other (See Comments)   Latex Itching   Other Other (See Comments)   Pioglitazone Other (See Comments)   Prior to Admission medications   Medication Sig Start Date End Date Taking? Authorizing Provider  acetaminophen (TYLENOL) 500 MG tablet  Take 500 mg by mouth every 6 (six) hours as needed for moderate pain (pain score 4-6).    [provider]  albuterol (VENTOLIN HFA) 108 (90 Base) MCG/ACT inhaler Inhale 2 puffs into the lungs every 6 (six) hours as needed for wheezing or shortness of breath.    [provider]  atorvastatin (LIPITOR) 80 MG tablet TAKE 1 TABLET BY MOUTH EVERY MORNING. 02/28/23   Runell Gess, MD  budesonide-formoterol [facility]) 160-4.5 MCG/ACT inhaler Inhale 2 puffs into the lungs at bedtime.    [provider]  clobetasol (TEMOVATE) 0.05 % external solution Apply 1 Application topically 2 (two) times daily as needed (psoriasis).    [provider]  ELIQUIS 2.5 MG TABS tablet TAKE 1 TABLET BY MOUTH TWICE A DAY 06/02/23   Runell Gess, MD  hydrochlorothiazide (HYDRODIURIL) 25 MG tablet Take 25 mg by mouth daily.    [provider]  Insulin Glargine (BASAGLAR KWIKPEN) 100 UNIT/ML Inject 8 Units into the skin daily.    [provider]  levothyroxine (SYNTHROID) 100 MCG tablet Take 88 mcg by mouth daily before breakfast.    [provider]  NOVOLOG FLEXPEN 100 UNIT/ML FlexPen Inject 5-7 Units into the skin See admin instructions. Inject 5 units under the skin in the morning, 6 units ,at lunch and 7 units with dinner 12/23/14   [provider]  ONE TOUCH ULTRA TEST test strip TEST THREE TIMES DAILY E11.22 Patient not taking: Reported on 04/05/2023 10/06/17   [provider]  ramipril (ALTACE) 2.5 MG capsule Take 1 capsule (2.5 mg total) by mouth daily. 02/28/23 05/31/23  Runell Gess, MD     Positive ROS: All other systems have been reviewed and were otherwise negative with the exception of those mentioned in the HPI and as above.  Physical Exam: General: Alert, no acute distress Respiratory: No cyanosis, no use of accessory musculature GI: No organomegaly, abdomen is soft and non-tender Skin: No lesions in the area of chief  complaint Neurologic: Sensation intact distally Psychiatric: Patient is competent for consent with normal mood and affect Lymphatic: No axillary or cervical lymphadenopathy  MUSCULOSKELETAL:   Examination of the right hip reveals well-healed incisions x 3. No erythema or swelling. She has severely restricted range of motion of the right hip. Pain with terminal flexion and rotation. Pain in the position of impingement. Positive Stinchfield.  Distally, she has 1+ pedal pulses. She reports intact sensation. No focal motor deficit.  Mild bilateral LE pitting edema much better controlled with compliance of compression stockings.   She ambulates with a severely antalgic gait using a standard rolling walker.  Assessment: Posttraumatic avascular necrosis right hip with collapse and intra-articular protrusion of hardware. History of intramedullary nailing right intertrochanteric femur fracture, fracture healed. Retained hardware right hip.  Plan: Plan for right hip hardware removal.   The risks benefits and alternatives were discussed with the patient including but not limited to the risks of nonoperative treatment, versus surgical intervention including infection, bleeding, nerve injury,  blood clots, cardiopulmonary complications, morbidity, mortality, among others, and they were willing to proceed.   Therapy Plans: HEP.  Disposition: Home with Steward Drone the sister Planned DVT Prophylaxis: Eliquis 2.5 BID DME needed: Has rolling walker.  PCP: Cleared.  Cardiology: Cleared.  TXA: IV Allergies:  - Adhesive tape - breakout and itching.  - Latex - redness, itching.  Anesthesia Concerns: None. Doesn't want fentanyl.  BMI: 18.0 Last HgbA1c: 6.5 Other: - Issues with orthostatic hypotension, cardiology cleared. Likely new baseline.  - CAD.  - Afib, Eliquis at baseline, hold 3 days prior to surgery.  - PAD - COPD - [place]** - Osteoporosis, not taking alendronate anymore.  - T2DM, insulin.   - Hydrocodone, zofran. - NO NSAIDs.  - 06/07/23: Hgb 11.0, K+3.3, Cr. 1.49 .    Clois Dupes, PA-C [phone]   06/13/2023 4:59 PM

## 2023-06-13 NOTE — Progress Notes (Signed)
  Subjective:  Patient ID: Ariel Cobb, female    DOB: 11/13/1945,   MRN: [id number]  No chief complaint on file.   77 y.o. female presents for concern of thickened elongated and painful nails that are difficult to trim. Requesting to have them trimmed today. She is on blood thinner. Relates burning and tingling in their feet. Patient is diabetic and last A1c was  Lab Results  Component Value Date   HGBA1C 5.9 (H) 06/07/2023   .   PCP:  Renford Dills, MD     PCP:  Renford Dills, MD    . Denies any other pedal complaints. Denies n/v/f/c.   Past Medical History:  Diagnosis Date   A-fib (HCC)    Anemia    Arthritis    Chronic bronchitis (HCC)    Chronic kidney disease    sees dr sanford yearly stage 3 stable   Claudication (HCC)    Complication of anesthesia 2000   slow to awaken after hip surgery   COPD (chronic obstructive pulmonary disease) (HCC)    Depression    Diabetes mellitus without complication (HCC)    type 1   Dysrhythmia    Hyperlipidemia    Hypertension    Hypothyroidism    Impetigo    Neuromuscular disorder (HCC)    peripheral neuropathy   Osteoporosis    PAD (peripheral artery disease) (HCC)    a. 07/29/13: s/p PV angiogram with successful diamondback orbital rotational atherectomy of the high-grade calcified R popliteal artery stenosis   Shortness of breath    on exertion   Thyroid disease     Objective:  Physical Exam: Vascular: DP/PT pulses 2/4 bilateral. CFT <3 seconds. Absent hair growth on digits. Edema noted to bilateral lower extremities. Xerosis noted bilaterally.  Skin. No lacerations or abrasions bilateral feet. Nails 1-5 bilateral  are thickened discolored and elongated with subungual debris.  Musculoskeletal: MMT 5/5 bilateral lower extremities in DF, PF, Inversion and Eversion. Deceased ROM in DF of ankle joint.  Neurological: Sensation intact to light touch. Protective sensation intact bilateral.    Assessment:   1. Pain due to  onychomycosis of toenails of both feet   2. Type 2 diabetes mellitus with stage 3b chronic kidney disease, with long-term current use of insulin (HCC)   3. PAD (peripheral artery disease) (HCC)        Plan:  Patient was evaluated and treated and all questions answered. -Discussed and educated patient on diabetic foot care, especially with  regards to the vascular, neurological and musculoskeletal systems.  -Stressed the importance of good glycemic control and the detriment of not  controlling glucose levels in relation to the foot. -Discussed supportive shoes at all times and checking feet regularly.  -Mechanically debrided all nails 1-5 bilateral using sterile nail nipper and filed with dremel without incident  -Answered all patient questions -Patient to return  in 3 months for at risk foot care -Patient advised to call the office if any problems or questions arise in the meantime.    Louann Sjogren, DPM

## 2023-06-13 NOTE — H&P (View-Only) (Signed)
PREOPERATIVE H&P  Chief Complaint: right hip pain  HPI: Ariel Cobb is a 77 y.o. female who presents for preoperative history and physical with a diagnosis of retained hardware and avascular necrosis of her right hip. Symptoms are rated as moderate to severe, and have been worsening.  This is significantly impairing activities of daily living.  She has elected for surgical management.   Past Medical History:  Diagnosis Date   A-fib (HCC)    Anemia    Arthritis    Chronic bronchitis (HCC)    Chronic kidney disease    sees dr sanford yearly stage 3 stable   Claudication (HCC)    Complication of anesthesia 2000   slow to awaken after hip surgery   COPD (chronic obstructive pulmonary disease) (HCC)    Depression    Diabetes mellitus without complication (HCC)    type 1   Dysrhythmia    Hyperlipidemia    Hypertension    Hypothyroidism    Impetigo    Neuromuscular disorder (HCC)    peripheral neuropathy   Osteoporosis    PAD (peripheral artery disease) (HCC)    a. 07/29/13: s/p PV angiogram with successful diamondback orbital rotational atherectomy of the high-grade calcified R popliteal artery stenosis   Shortness of breath    on exertion   Thyroid disease    Past Surgical History:  Procedure Laterality Date   ABDOMINAL AORTAGRAM  06/04/2013   Procedure: ABDOMINAL AORTAGRAM;  Surgeon: Runell Gess, MD;  Location: [facility] CATH LAB;  Service: Cardiovascular;;   ATHERECTOMY Right 07/29/2013   popliteal/notes 07/29/2013   CATARACT EXTRACTION, BILATERAL Bilateral    CHOLECYSTECTOMY  1980's?   COLONOSCOPY WITH PROPOFOL N/A 09/06/2016   Procedure: COLONOSCOPY WITH PROPOFOL;  Surgeon: Charolett Bumpers, MD;  Location: [facility] ENDOSCOPY;  Service: Endoscopy;  Laterality: N/A;   cyst from right hand     ESOPHAGOGASTRODUODENOSCOPY (EGD) WITH PROPOFOL N/A 09/06/2016   Procedure: ESOPHAGOGASTRODUODENOSCOPY (EGD) WITH PROPOFOL;  Surgeon: Charolett Bumpers, MD;  Location: [facility] ENDOSCOPY;   Service: Endoscopy;  Laterality: N/A;   HIP FRACTURE SURGERY Left 2000's   "I broke the ball; dr broke femur during OR; got a rod in there" 92/07/2013)   INTRAMEDULLARY (IM) NAIL INTERTROCHANTERIC Right 08/06/2021   Procedure: INTRAMEDULLARY (IM) NAIL INTERTROCHANTRIC;  Surgeon: Roby Lofts, MD;  Location: [facility] OR;  Service: Orthopedics;  Laterality: Right;   LOWER EXTREMITY ANGIOGRAM N/A 06/04/2013   Procedure: LOWER EXTREMITY ANGIOGRAM;  Surgeon: Runell Gess, MD;  Location: [facility] CATH LAB;  Service: Cardiovascular;  Laterality: N/A;   Social History   Socioeconomic History   Marital status: Widowed    Spouse name: Not on file   Number of children: Not on file   Years of education: Not on file   Highest education level: Not on file  Occupational History   Not on file  Tobacco Use   Smoking status: Every Day    Current packs/day: 1.00    Average packs/day: 1 pack/day for 50.0 years (50.0 ttl pk-yrs)    Types: Cigarettes   Smokeless tobacco: Never  Vaping Use   Vaping status: Never Used  Substance and Sexual Activity   Alcohol use: No   Drug use: No   Sexual activity: Never  Other Topics Concern   Not on file  Social History Narrative   Not on file   Social Drivers of Health   Financial Resource Strain: Not on file  Food Insecurity: Not on file  Transportation Needs: Not  on file  Physical Activity: Not on file  Stress: Not on file  Social Connections: Not on file   Family History  Problem Relation Age of Onset   Heart disease Father    Allergies  Allergen Reactions   Adhesive [Tape] Itching    Surgical Tape - "makes me itch and break me out" -- HAS TO USE PAPER TAPE   Amoxicillin Hives, Itching and Swelling   Fentanyl Other (See Comments)   Latex Itching   Other Other (See Comments)   Pioglitazone Other (See Comments)   Prior to Admission medications   Medication Sig Start Date End Date Taking? Authorizing Provider  acetaminophen (TYLENOL) 500 MG tablet  Take 500 mg by mouth every 6 (six) hours as needed for moderate pain (pain score 4-6).    [provider]  albuterol (VENTOLIN HFA) 108 (90 Base) MCG/ACT inhaler Inhale 2 puffs into the lungs every 6 (six) hours as needed for wheezing or shortness of breath.    [provider]  atorvastatin (LIPITOR) 80 MG tablet TAKE 1 TABLET BY MOUTH EVERY MORNING. 02/28/23   Runell Gess, MD  budesonide-formoterol [facility]) 160-4.5 MCG/ACT inhaler Inhale 2 puffs into the lungs at bedtime.    [provider]  clobetasol (TEMOVATE) 0.05 % external solution Apply 1 Application topically 2 (two) times daily as needed (psoriasis).    [provider]  ELIQUIS 2.5 MG TABS tablet TAKE 1 TABLET BY MOUTH TWICE A DAY 06/02/23   Runell Gess, MD  hydrochlorothiazide (HYDRODIURIL) 25 MG tablet Take 25 mg by mouth daily.    [provider]  Insulin Glargine (BASAGLAR KWIKPEN) 100 UNIT/ML Inject 8 Units into the skin daily.    [provider]  levothyroxine (SYNTHROID) 100 MCG tablet Take 88 mcg by mouth daily before breakfast.    [provider]  NOVOLOG FLEXPEN 100 UNIT/ML FlexPen Inject 5-7 Units into the skin See admin instructions. Inject 5 units under the skin in the morning, 6 units ,at lunch and 7 units with dinner 12/23/14   [provider]  ONE TOUCH ULTRA TEST test strip TEST THREE TIMES DAILY E11.22 Patient not taking: Reported on 04/05/2023 10/06/17   [provider]  ramipril (ALTACE) 2.5 MG capsule Take 1 capsule (2.5 mg total) by mouth daily. 02/28/23 05/31/23  Runell Gess, MD     Positive ROS: All other systems have been reviewed and were otherwise negative with the exception of those mentioned in the HPI and as above.  Physical Exam: General: Alert, no acute distress Respiratory: No cyanosis, no use of accessory musculature GI: No organomegaly, abdomen is soft and non-tender Skin: No lesions in the area of chief  complaint Neurologic: Sensation intact distally Psychiatric: Patient is competent for consent with normal mood and affect Lymphatic: No axillary or cervical lymphadenopathy  MUSCULOSKELETAL:   Examination of the right hip reveals well-healed incisions x 3. No erythema or swelling. She has severely restricted range of motion of the right hip. Pain with terminal flexion and rotation. Pain in the position of impingement. Positive Stinchfield.  Distally, she has 1+ pedal pulses. She reports intact sensation. No focal motor deficit.  Mild bilateral LE pitting edema much better controlled with compliance of compression stockings.   She ambulates with a severely antalgic gait using a standard rolling walker.  Assessment: Posttraumatic avascular necrosis right hip with collapse and intra-articular protrusion of hardware. History of intramedullary nailing right intertrochanteric femur fracture, fracture healed. Retained hardware right hip.  Plan: Plan for right hip hardware removal.   The risks benefits and alternatives were discussed with the patient including but not limited to the risks of nonoperative treatment, versus surgical intervention including infection, bleeding, nerve injury,  blood clots, cardiopulmonary complications, morbidity, mortality, among others, and they were willing to proceed.   Therapy Plans: HEP.  Disposition: Home with Steward Drone the sister Planned DVT Prophylaxis: Eliquis 2.5 BID DME needed: Has rolling walker.  PCP: Cleared.  Cardiology: Cleared.  TXA: IV Allergies:  - Adhesive tape - breakout and itching.  - Latex - redness, itching.  Anesthesia Concerns: None. Doesn't want fentanyl.  BMI: 18.0 Last HgbA1c: 6.5 Other: - Issues with orthostatic hypotension, cardiology cleared. Likely new baseline.  - CAD.  - Afib, Eliquis at baseline, hold 3 days prior to surgery.  - PAD - COPD - Staples** - Osteoporosis, not taking alendronate anymore.  - T2DM, insulin.   - Hydrocodone, zofran. - NO NSAIDs.  - 06/07/23: Hgb 11.0, K+3.3, Cr. 1.49 .    Clois Dupes, PA-C [phone]   06/13/2023 4:59 PM

## 2023-06-14 ENCOUNTER — Other Ambulatory Visit: Payer: Self-pay

## 2023-06-14 ENCOUNTER — Ambulatory Visit (HOSPITAL_COMMUNITY): Payer: Medicare HMO

## 2023-06-14 ENCOUNTER — Encounter (HOSPITAL_COMMUNITY): Payer: Self-pay | Admitting: Orthopedic Surgery

## 2023-06-14 ENCOUNTER — Ambulatory Visit (HOSPITAL_COMMUNITY)
Admission: RE | Admit: 2023-06-14 | Discharge: 2023-06-15 | Disposition: A | Discharge status: Home / Self Care | Payer: Medicare HMO | Attending: Orthopedic Surgery | Admitting: Orthopedic Surgery

## 2023-06-14 ENCOUNTER — Ambulatory Visit (HOSPITAL_COMMUNITY): Payer: Self-pay | Admitting: Physician Assistant

## 2023-06-14 ENCOUNTER — Ambulatory Visit (HOSPITAL_COMMUNITY): Payer: Self-pay | Admitting: Anesthesiology

## 2023-06-14 ENCOUNTER — Encounter (HOSPITAL_COMMUNITY)
Admission: RE | Disposition: A | Discharge status: Home / Self Care | Payer: Self-pay | Source: Home / Self Care | Attending: Orthopedic Surgery

## 2023-06-14 DIAGNOSIS — J449 Chronic obstructive pulmonary disease, unspecified: Secondary | ICD-10-CM

## 2023-06-14 DIAGNOSIS — Z472 Encounter for removal of internal fixation device: Secondary | ICD-10-CM | POA: Diagnosis not present

## 2023-06-14 DIAGNOSIS — N183 Chronic kidney disease, stage 3 unspecified: Secondary | ICD-10-CM | POA: Insufficient documentation

## 2023-06-14 DIAGNOSIS — X58XXXA Exposure to other specified factors, initial encounter: Secondary | ICD-10-CM | POA: Diagnosis not present

## 2023-06-14 DIAGNOSIS — Z7901 Long term (current) use of anticoagulants: Secondary | ICD-10-CM | POA: Insufficient documentation

## 2023-06-14 DIAGNOSIS — E039 Hypothyroidism, unspecified: Secondary | ICD-10-CM | POA: Diagnosis not present

## 2023-06-14 DIAGNOSIS — I129 Hypertensive chronic kidney disease with stage 1 through stage 4 chronic kidney disease, or unspecified chronic kidney disease: Secondary | ICD-10-CM | POA: Insufficient documentation

## 2023-06-14 DIAGNOSIS — E119 Type 2 diabetes mellitus without complications: Secondary | ICD-10-CM

## 2023-06-14 DIAGNOSIS — I4891 Unspecified atrial fibrillation: Secondary | ICD-10-CM | POA: Insufficient documentation

## 2023-06-14 DIAGNOSIS — E1122 Type 2 diabetes mellitus with diabetic chronic kidney disease: Secondary | ICD-10-CM | POA: Diagnosis not present

## 2023-06-14 DIAGNOSIS — E1022 Type 1 diabetes mellitus with diabetic chronic kidney disease: Secondary | ICD-10-CM | POA: Insufficient documentation

## 2023-06-14 DIAGNOSIS — M879 Osteonecrosis, unspecified: Secondary | ICD-10-CM | POA: Diagnosis not present

## 2023-06-14 DIAGNOSIS — Z8249 Family history of ischemic heart disease and other diseases of the circulatory system: Secondary | ICD-10-CM | POA: Insufficient documentation

## 2023-06-14 DIAGNOSIS — M87051 Idiopathic aseptic necrosis of right femur: Secondary | ICD-10-CM

## 2023-06-14 DIAGNOSIS — T8484XA Pain due to internal orthopedic prosthetic devices, implants and grafts, initial encounter: Secondary | ICD-10-CM | POA: Diagnosis not present

## 2023-06-14 DIAGNOSIS — N1832 Chronic kidney disease, stage 3b: Secondary | ICD-10-CM | POA: Diagnosis not present

## 2023-06-14 DIAGNOSIS — Z794 Long term (current) use of insulin: Secondary | ICD-10-CM | POA: Diagnosis not present

## 2023-06-14 DIAGNOSIS — E1051 Type 1 diabetes mellitus with diabetic peripheral angiopathy without gangrene: Secondary | ICD-10-CM | POA: Diagnosis not present

## 2023-06-14 DIAGNOSIS — M8588 Other specified disorders of bone density and structure, other site: Secondary | ICD-10-CM | POA: Diagnosis not present

## 2023-06-14 DIAGNOSIS — Z96642 Presence of left artificial hip joint: Secondary | ICD-10-CM | POA: Diagnosis not present

## 2023-06-14 HISTORY — PX: HARDWARE REMOVAL: SHX979

## 2023-06-14 LAB — TYPE AND SCREEN
ABO/RH(D): O NEG
Antibody Screen: NEGATIVE

## 2023-06-14 LAB — GLUCOSE, CAPILLARY
Glucose-Capillary: 172 mg/dL — ABNORMAL HIGH (ref 70–99)
Glucose-Capillary: 140 mg/dL — ABNORMAL HIGH (ref 70–99)
Glucose-Capillary: 126 mg/dL — ABNORMAL HIGH (ref 70–99)
Glucose-Capillary: 209 mg/dL — ABNORMAL HIGH (ref 70–99)

## 2023-06-14 SURGERY — REMOVAL, HARDWARE
Anesthesia: Spinal | Laterality: Right

## 2023-06-14 MED ORDER — METHOCARBAMOL 1000 MG/10ML IJ SOLN
500.0000 mg | Freq: Four times a day (QID) | INTRAMUSCULAR | Status: DC | PRN
Start: 2023-06-14 — End: 2023-06-15

## 2023-06-14 MED ORDER — LACTATED RINGERS IV SOLN
INTRAVENOUS | Status: DC
Start: 2023-06-14 — End: 2023-06-14

## 2023-06-14 MED ORDER — TRANEXAMIC ACID-NACL 1000-0.7 MG/100ML-% IV SOLN
1000.0000 mg | INTRAVENOUS | Status: AC
  Administered 2023-06-14: 1000 mg via INTRAVENOUS
  Filled 2023-06-14: qty 100

## 2023-06-14 MED ORDER — ALBUTEROL SULFATE HFA 108 (90 BASE) MCG/ACT IN AERS: 2.0000 | INHALATION_SPRAY | Freq: Four times a day (QID) | RESPIRATORY_TRACT | Status: DC | PRN

## 2023-06-14 MED ORDER — PROPOFOL 1000 MG/100ML IV EMUL
INTRAVENOUS | Status: AC
  Filled 2023-06-14: qty 100

## 2023-06-14 MED ORDER — SODIUM CHLORIDE 0.9 % IV SOLN: INTRAVENOUS | Status: DC

## 2023-06-14 MED ORDER — CHLORHEXIDINE GLUCONATE 0.12 % MT SOLN
15.0000 mL | Freq: Once | OROMUCOSAL | Status: AC
  Administered 2023-06-14: 15 mL via OROMUCOSAL

## 2023-06-14 MED ORDER — MENTHOL 3 MG MT LOZG: 1.0000 | LOZENGE | OROMUCOSAL | Status: DC | PRN

## 2023-06-14 MED ORDER — ATORVASTATIN CALCIUM 40 MG PO TABS
80.0000 mg | ORAL_TABLET | Freq: Every morning | ORAL | Status: DC
  Administered 2023-06-15: 80 mg via ORAL
  Filled 2023-06-14: qty 2

## 2023-06-14 MED ORDER — LEVOTHYROXINE SODIUM 88 MCG PO TABS
88.0000 ug | ORAL_TABLET | Freq: Every day | ORAL | Status: DC
  Administered 2023-06-15: 88 ug via ORAL
  Filled 2023-06-14: qty 1

## 2023-06-14 MED ORDER — APIXABAN 2.5 MG PO TABS
2.5000 mg | ORAL_TABLET | Freq: Two times a day (BID) | ORAL | Status: DC
  Administered 2023-06-15: 2.5 mg via ORAL
  Filled 2023-06-14: qty 1

## 2023-06-14 MED ORDER — FENTANYL CITRATE PF 50 MCG/ML IJ SOSY
25.0000 ug | PREFILLED_SYRINGE | INTRAMUSCULAR | Status: DC | PRN
  Administered 2023-06-14: 25 ug via INTRAVENOUS

## 2023-06-14 MED ORDER — SENNA 8.6 MG PO TABS
1.0000 | ORAL_TABLET | Freq: Two times a day (BID) | ORAL | Status: DC
  Administered 2023-06-14 – 2023-06-15 (×2): 8.6 mg via ORAL
  Filled 2023-06-14 (×2): qty 1

## 2023-06-14 MED ORDER — LIDOCAINE HCL (CARDIAC) PF 100 MG/5ML IV SOSY
PREFILLED_SYRINGE | INTRAVENOUS | Status: DC | PRN
  Administered 2023-06-14: 40 mg via INTRAVENOUS

## 2023-06-14 MED ORDER — ALUM & MAG HYDROXIDE-SIMETH 200-200-20 MG/5ML PO SUSP: 30.0000 mL | ORAL | Status: DC | PRN

## 2023-06-14 MED ORDER — PHENYLEPHRINE HCL-NACL 20-0.9 MG/250ML-% IV SOLN
INTRAVENOUS | Status: DC | PRN
  Administered 2023-06-14: 10 ug/min via INTRAVENOUS

## 2023-06-14 MED ORDER — ACETAMINOPHEN 325 MG PO TABS: 325.0000 mg | ORAL_TABLET | Freq: Four times a day (QID) | ORAL | Status: DC | PRN

## 2023-06-14 MED ORDER — PROPOFOL 10 MG/ML IV BOLUS
INTRAVENOUS | Status: AC
  Filled 2023-06-14: qty 20

## 2023-06-14 MED ORDER — PROPOFOL 10 MG/ML IV BOLUS
INTRAVENOUS | Status: DC | PRN
  Administered 2023-06-14 (×2): 20 mg via INTRAVENOUS

## 2023-06-14 MED ORDER — PROPOFOL 500 MG/50ML IV EMUL
INTRAVENOUS | Status: DC | PRN
  Administered 2023-06-14: 90 ug/kg/min via INTRAVENOUS

## 2023-06-14 MED ORDER — METHOCARBAMOL 500 MG PO TABS: 500.0000 mg | ORAL_TABLET | Freq: Four times a day (QID) | ORAL | Status: DC | PRN

## 2023-06-14 MED ORDER — ONDANSETRON HCL 4 MG/2ML IJ SOLN
INTRAMUSCULAR | Status: DC | PRN
  Administered 2023-06-14: 4 mg via INTRAVENOUS

## 2023-06-14 MED ORDER — INSULIN ASPART 100 UNIT/ML IJ SOLN
0.0000 [IU] | Freq: Three times a day (TID) | INTRAMUSCULAR | Status: DC
  Administered 2023-06-15: 2 [IU] via SUBCUTANEOUS
  Administered 2023-06-15: 1 [IU] via SUBCUTANEOUS

## 2023-06-14 MED ORDER — POVIDONE-IODINE 10 % EX SWAB: 2.0000 | Freq: Once | CUTANEOUS | Status: DC

## 2023-06-14 MED ORDER — DIPHENHYDRAMINE HCL 12.5 MG/5ML PO ELIX: 12.5000 mg | ORAL_SOLUTION | ORAL | Status: DC | PRN

## 2023-06-14 MED ORDER — ROCURONIUM BROMIDE 10 MG/ML (PF) SYRINGE
PREFILLED_SYRINGE | INTRAVENOUS | Status: AC
  Filled 2023-06-14: qty 10

## 2023-06-14 MED ORDER — PHENOL 1.4 % MT LIQD: 1.0000 | OROMUCOSAL | Status: DC | PRN

## 2023-06-14 MED ORDER — ONDANSETRON HCL 4 MG/2ML IJ SOLN: 4.0000 mg | Freq: Once | INTRAMUSCULAR | Status: DC | PRN

## 2023-06-14 MED ORDER — HYDROCODONE-ACETAMINOPHEN 7.5-325 MG PO TABS
1.0000 | ORAL_TABLET | ORAL | Status: DC | PRN
Start: 2023-06-14 — End: 2023-06-15

## 2023-06-14 MED ORDER — EPHEDRINE SULFATE (PRESSORS) 50 MG/ML IJ SOLN: INTRAMUSCULAR | Status: DC | PRN

## 2023-06-14 MED ORDER — INSULIN ASPART 100 UNIT/ML IJ SOLN
0.0000 [IU] | Freq: Every day | INTRAMUSCULAR | Status: DC
  Administered 2023-06-14: 2 [IU] via SUBCUTANEOUS

## 2023-06-14 MED ORDER — HYDROCODONE-ACETAMINOPHEN 5-325 MG PO TABS
1.0000 | ORAL_TABLET | ORAL | Status: DC | PRN
Start: 2023-06-14 — End: 2023-06-15
  Administered 2023-06-14 – 2023-06-15 (×2): 1 via ORAL
  Filled 2023-06-14 (×2): qty 1

## 2023-06-14 MED ORDER — POLYETHYLENE GLYCOL 3350 17 G PO PACK: 17.0000 g | PACK | Freq: Every day | ORAL | Status: DC | PRN

## 2023-06-14 MED ORDER — PANTOPRAZOLE SODIUM 40 MG PO TBEC
40.0000 mg | DELAYED_RELEASE_TABLET | Freq: Every day | ORAL | Status: DC
  Administered 2023-06-14 – 2023-06-15 (×2): 40 mg via ORAL
  Filled 2023-06-14 (×2): qty 1

## 2023-06-14 MED ORDER — BUPIVACAINE HCL (PF) 0.5 % IJ SOLN
INTRAMUSCULAR | Status: DC | PRN
  Administered 2023-06-14: 3 mL via INTRATHECAL

## 2023-06-14 MED ORDER — METOCLOPRAMIDE HCL 5 MG/ML IJ SOLN: 5.0000 mg | Freq: Three times a day (TID) | INTRAMUSCULAR | Status: DC | PRN

## 2023-06-14 MED ORDER — MOMETASONE FURO-FORMOTEROL FUM 200-5 MCG/ACT IN AERO
2.0000 | INHALATION_SPRAY | Freq: Two times a day (BID) | RESPIRATORY_TRACT | Status: DC
  Administered 2023-06-15: 2 via RESPIRATORY_TRACT
  Filled 2023-06-14 (×2): qty 8.8

## 2023-06-14 MED ORDER — LIDOCAINE HCL (PF) 2 % IJ SOLN
INTRAMUSCULAR | Status: AC
Start: 2023-06-14 — End: ?
  Filled 2023-06-14: qty 5

## 2023-06-14 MED ORDER — HYDROCHLOROTHIAZIDE 25 MG PO TABS
25.0000 mg | ORAL_TABLET | Freq: Every day | ORAL | Status: DC
Start: 2023-06-15 — End: 2023-06-15
  Administered 2023-06-15: 25 mg via ORAL
  Filled 2023-06-14: qty 1

## 2023-06-14 MED ORDER — ACETAMINOPHEN 500 MG PO TABS
1000.0000 mg | ORAL_TABLET | Freq: Once | ORAL | Status: AC
  Administered 2023-06-14: 1000 mg via ORAL
  Filled 2023-06-14: qty 2

## 2023-06-14 MED ORDER — ONDANSETRON HCL 4 MG/2ML IJ SOLN
INTRAMUSCULAR | Status: AC
Start: 2023-06-14 — End: ?
  Filled 2023-06-14: qty 2

## 2023-06-14 MED ORDER — FENTANYL CITRATE PF 50 MCG/ML IJ SOSY
PREFILLED_SYRINGE | INTRAMUSCULAR | Status: AC
  Filled 2023-06-14: qty 1

## 2023-06-14 MED ORDER — INSULIN ASPART 100 UNIT/ML IJ SOLN: 0.0000 [IU] | INTRAMUSCULAR | Status: DC | PRN

## 2023-06-14 MED ORDER — ACETAMINOPHEN 10 MG/ML IV SOLN: 650.0000 mg | Freq: Once | INTRAVENOUS | Status: DC | PRN

## 2023-06-14 MED ORDER — ORAL CARE MOUTH RINSE: 15.0000 mL | Freq: Once | OROMUCOSAL | Status: AC

## 2023-06-14 MED ORDER — ALBUTEROL SULFATE (2.5 MG/3ML) 0.083% IN NEBU: 2.5000 mg | INHALATION_SOLUTION | Freq: Four times a day (QID) | RESPIRATORY_TRACT | Status: DC | PRN

## 2023-06-14 MED ORDER — EPHEDRINE 5 MG/ML INJ
INTRAVENOUS | Status: AC
  Filled 2023-06-14: qty 5

## 2023-06-14 MED ORDER — CEFAZOLIN SODIUM-DEXTROSE 2-4 GM/100ML-% IV SOLN
2.0000 g | INTRAVENOUS | Status: AC
Start: 2023-06-14 — End: 2023-06-14
  Administered 2023-06-14: 2 g via INTRAVENOUS
  Filled 2023-06-14: qty 100

## 2023-06-14 MED ORDER — FENTANYL CITRATE (PF) 100 MCG/2ML IJ SOLN
INTRAMUSCULAR | Status: AC
  Filled 2023-06-14: qty 2

## 2023-06-14 MED ORDER — CEFAZOLIN SODIUM-DEXTROSE 2-4 GM/100ML-% IV SOLN
2.0000 g | Freq: Four times a day (QID) | INTRAVENOUS | Status: AC
  Administered 2023-06-14 – 2023-06-15 (×2): 2 g via INTRAVENOUS
  Filled 2023-06-14 (×2): qty 100

## 2023-06-14 MED ORDER — PHENYLEPHRINE HCL-NACL 20-0.9 MG/250ML-% IV SOLN
INTRAVENOUS | Status: AC
  Filled 2023-06-14: qty 250

## 2023-06-14 MED ORDER — MORPHINE SULFATE (PF) 2 MG/ML IV SOLN: 0.5000 mg | INTRAVENOUS | Status: DC | PRN

## 2023-06-14 MED ORDER — DEXMEDETOMIDINE HCL IN NACL 80 MCG/20ML IV SOLN
INTRAVENOUS | Status: AC
  Filled 2023-06-14: qty 20

## 2023-06-14 MED ORDER — LACTATED RINGERS IV SOLN: INTRAVENOUS | Status: DC

## 2023-06-14 MED ORDER — BISACODYL 10 MG RE SUPP: 10.0000 mg | Freq: Every day | RECTAL | Status: DC | PRN

## 2023-06-14 MED ORDER — ONDANSETRON HCL 4 MG/2ML IJ SOLN
4.0000 mg | Freq: Four times a day (QID) | INTRAMUSCULAR | Status: DC | PRN
Start: 2023-06-14 — End: 2023-06-15

## 2023-06-14 MED ORDER — CLOBETASOL PROPIONATE 0.05 % EX CREA: 1.0000 | TOPICAL_CREAM | Freq: Two times a day (BID) | CUTANEOUS | Status: DC | PRN

## 2023-06-14 MED ORDER — DOCUSATE SODIUM 100 MG PO CAPS
100.0000 mg | ORAL_CAPSULE | Freq: Two times a day (BID) | ORAL | Status: DC
  Administered 2023-06-14 – 2023-06-15 (×2): 100 mg via ORAL
  Filled 2023-06-14 (×2): qty 1

## 2023-06-14 MED ORDER — 0.9 % SODIUM CHLORIDE (POUR BTL) OPTIME
TOPICAL | Status: DC | PRN
  Administered 2023-06-14: 1000 mL

## 2023-06-14 MED ORDER — LACTATED RINGERS IV SOLN: INTRAVENOUS | Status: DC | PRN

## 2023-06-14 MED ORDER — EPHEDRINE SULFATE (PRESSORS) 50 MG/ML IJ SOLN
INTRAMUSCULAR | Status: DC | PRN
  Administered 2023-06-14: 10 mg via INTRAVENOUS

## 2023-06-14 MED ORDER — METOCLOPRAMIDE HCL 5 MG PO TABS: 5.0000 mg | ORAL_TABLET | Freq: Three times a day (TID) | ORAL | Status: DC | PRN

## 2023-06-14 MED ORDER — SODIUM CHLORIDE 0.9 % IR SOLN
Status: DC | PRN
Start: 2023-06-14 — End: 2023-06-14
  Administered 2023-06-14: 2000 mL

## 2023-06-14 MED ORDER — ONDANSETRON HCL 4 MG PO TABS: 4.0000 mg | ORAL_TABLET | Freq: Four times a day (QID) | ORAL | Status: DC | PRN

## 2023-06-14 SURGICAL SUPPLY — 52 items
BAG COUNTER SPONGE SURGICOUNT (BAG) IMPLANT
BAG ZIPLOCK 12X15 (MISCELLANEOUS) ×1 IMPLANT
BANDAGE ESMARK 6X9 LF (GAUZE/BANDAGES/DRESSINGS) ×1 IMPLANT
BLADE HEX COATED 2.75 (ELECTRODE) ×1 IMPLANT
BNDG ELASTIC 6INX 5YD STR LF (GAUZE/BANDAGES/DRESSINGS) ×1 IMPLANT
BNDG ESMARK 6X9 LF (GAUZE/BANDAGES/DRESSINGS) ×1 IMPLANT
CHLORAPREP W/TINT 26 (MISCELLANEOUS) ×1 IMPLANT
COVER MAYO STAND STRL (DRAPES) IMPLANT
COVER SURGICAL LIGHT HANDLE (MISCELLANEOUS) ×1 IMPLANT
CUFF TRNQT CYL 34X4.125X (TOURNIQUET CUFF) ×1 IMPLANT
DERMABOND ADVANCED .7 DNX12 (GAUZE/BANDAGES/DRESSINGS) ×2 IMPLANT
DRAPE C-ARM 42X120 X-RAY (DRAPES) IMPLANT
DRAPE C-ARMOR (DRAPES) ×1 IMPLANT
DRAPE HIP W/POCKET STRL (MISCELLANEOUS) ×1 IMPLANT
DRAPE INCISE IOBAN 66X45 STRL (DRAPES) ×1 IMPLANT
DRAPE INCISE IOBAN 85X60 (DRAPES) ×1 IMPLANT
DRAPE POUCH INSTRU U-SHP 10X18 (DRAPES) ×1 IMPLANT
DRAPE SHEET LG 3/4 BI-LAMINATE (DRAPES) ×3 IMPLANT
DRAPE SURG 17X11 SM STRL (DRAPES) ×1 IMPLANT
DRAPE SURG ORHT 6 SPLT 77X108 (DRAPES) ×2 IMPLANT
DRAPE U-SHAPE 47X51 STRL (DRAPES) ×1 IMPLANT
DRSG AQUACEL AG ADV 3.5X 4 (GAUZE/BANDAGES/DRESSINGS) ×1 IMPLANT
DRSG AQUACEL AG ADV 3.5X10 (GAUZE/BANDAGES/DRESSINGS) ×1 IMPLANT
DRSG EMULSION OIL 3X16 NADH (GAUZE/BANDAGES/DRESSINGS) ×1 IMPLANT
ELECT REM PT RETURN 15FT ADLT (MISCELLANEOUS) ×1 IMPLANT
FACESHIELD WRAPAROUND (MASK) ×3 IMPLANT
FACESHIELD WRAPAROUND OR TEAM (MASK) ×3 IMPLANT
GAUZE SPONGE 4X4 12PLY STRL (GAUZE/BANDAGES/DRESSINGS) ×1 IMPLANT
GLOVE BIO SURGEON STRL SZ8.5 (GLOVE) ×2 IMPLANT
GLOVE BIOGEL M 7.0 STRL (GLOVE) ×1 IMPLANT
GLOVE BIOGEL PI IND STRL 7.5 (GLOVE) ×1 IMPLANT
GLOVE BIOGEL PI IND STRL 8.5 (GLOVE) ×1 IMPLANT
GOWN SPEC L3 XXLG W/TWL (GOWN DISPOSABLE) ×2 IMPLANT
JET LAVAGE IRRISEPT WOUND (IRRIGATION / IRRIGATOR) ×1 IMPLANT
KIT BASIN OR (CUSTOM PROCEDURE TRAY) ×1 IMPLANT
KIT TURNOVER KIT A (KITS) IMPLANT
LAVAGE JET IRRISEPT WOUND (IRRIGATION / IRRIGATOR) IMPLANT
MANIFOLD NEPTUNE II (INSTRUMENTS) ×1 IMPLANT
MARKER SKIN DUAL TIP RULER LAB (MISCELLANEOUS) ×1 IMPLANT
NAIL EXTRACTOR DISP (INSTRUMENTS) IMPLANT
NS IRRIG 1000ML POUR BTL (IV SOLUTION) ×1 IMPLANT
PACK TOTAL JOINT (CUSTOM PROCEDURE TRAY) ×1 IMPLANT
PROTECTOR NERVE ULNAR (MISCELLANEOUS) ×1 IMPLANT
STAPLER SKIN PROX WIDE 3.9 (STAPLE) IMPLANT
SUT MNCRL AB 3-0 PS2 18 (SUTURE) ×1 IMPLANT
SUT MON AB 2-0 CT1 36 (SUTURE) ×1 IMPLANT
SUT VIC AB 1 CT1 36 (SUTURE) ×3 IMPLANT
SUT VIC AB 1 CTX36XBRD ANBCTR (SUTURE) ×1 IMPLANT
SUT VIC AB 2-0 CT1 TAPERPNT 27 (SUTURE) ×1 IMPLANT
TOWEL GREEN STERILE FF (TOWEL DISPOSABLE) ×1 IMPLANT
TOWEL OR 17X26 10 PK STRL BLUE (TOWEL DISPOSABLE) ×2 IMPLANT
WATER STERILE IRR 1000ML POUR (IV SOLUTION) ×1 IMPLANT

## 2023-06-14 NOTE — Op Note (Signed)
OPERATIVE REPORT   06/14/2023  3:55 PM  PATIENT:  Ariel Cobb   SURGEON:  Jonette Pesa, MD  ASSISTANT:  Clint Bolder, PA-C.   PREOPERATIVE DIAGNOSIS:  1. Avascular necrosis right hip, with collapse. 2. Retained hardware right proximal femur.  POSTOPERATIVE DIAGNOSIS:  Same.  PROCEDURE:  Removal of deep implant, right femur.  ANESTHESIA:   Spinal. MAC.  ANTIBIOTICS: 2 g Ancef.  IMPLANTS: [employer] InterTAN hip fracture nail, lag screw, compression screw, and distal interlocking screw.  SPECIMENS: None.  COMPLICATIONS: None.  DISPOSITION: Stable to PACU.  SURGICAL INDICATIONS:  Ariel Cobb is a 77 y.o. female who underwent previous placement of right hip fracture nail by Dr. Jena Gauss.  Her fracture healed, and she subsequently went on to develop avascular necrosis of the femoral head with collapse.  Due to pain and difficulty functioning, she was indicated for staged hardware removal, followed by right total hip arthroplasty in the future.  We discussed the risks, benefits, and alternatives, and she elected to proceed.  The risks, benefits, and alternatives were discussed with the patient preoperatively including but not limited to the risks of infection, bleeding, nerve / blood vessel injury, malunion, nonunion, cardiopulmonary complications, the need for repeat surgery, among others, and the patient was willing to proceed.  PROCEDURE IN DETAIL: The patient was frequently defy the holding area using 2 identifiers.  The surgical site was marked by myself.  She was taken to the operating room, where spinal anesthesia was obtained.  A Foley catheter was placed.  She was flipped to the left lateral decubitus position.  All bony prominences were well-padded.  An axillary roll was placed.  She was secured to the bed with the mark II hip positioner.  The right hip was prepped and draped in the normal sterile surgical fashion.  Patient did receive IV antibiotics within  60 minutes of beginning the procedure.  Fluoroscopy was used to identify the patient's anatomy and hardware positioning.  #10 blade was used to excise the previous 3 incisions.  Full-thickness skin flaps were created using blunt digital dissection.  The fascia was longitudinally incised with Bovie electrocautery.  Cobb was used to mobilize scar tissue.  The lag screw and compression screw were identified and the screwdrivers were placed.  Proximally, I identified the end of the nail.  Screwdriver was used to completely remove the setscrew.  I then threaded the nail extractor.  At this point, I returned my attention to the compression screw and lag screw which were easily removed.  I identified the distal interlocking screw which I removed with a screwdriver.  I then removed the nail proximally by hand.  The wounds were copiously irrigated with Irrisept solution followed by normal saline using pulsatile lavage.  Meticulous hemostasis was achieved with Bovie electrocautery.  The fascia was closed with #1 Vicryl.  Deep fatty tissue was closed with 2-0 Vicryl.  Deep dermal layer was closed with 2-0 Monocryl.  Skin was reapproximated with staples.  Dermabond was applied to the skin.  Once the glue was fully dried, Aquacel Ag dressing was applied.  Patient was then flipped supine, awakened from anesthesia, and taken to the PACU in stable condition.  Sponge, needle, and instrument counts were correct at the end of the case x 2.  There were no known complications.

## 2023-06-14 NOTE — Interval H&P Note (Signed)
History and Physical Interval Note:  06/14/2023 1:52 PM  Ariel Cobb  has presented today for surgery, with the diagnosis of avascular necrosis right hip , retained hardware.  The various methods of treatment have been discussed with the patient and family. After consideration of risks, benefits and other options for treatment, the patient has consented to  Procedure(s): HARDWARE REMOVAL (Right) as a surgical intervention.  The patient's history has been reviewed, patient examined, no change in status, stable for surgery.  I have reviewed the patient's chart and labs.  Questions were answered to the patient's satisfaction.     Iline Oven Antuan Limes

## 2023-06-14 NOTE — Anesthesia Procedure Notes (Signed)
Spinal  Patient location during procedure: OR Start time: 06/14/2023 2:06 PM End time: 06/14/2023 2:09 PM Reason for block: surgical anesthesia Staffing Performed: anesthesiologist  Anesthesiologist: Kaylyn Layer, MD Performed by: Kaylyn Layer, MD Authorized by: Kaylyn Layer, MD   Preanesthetic Checklist Completed: patient identified, IV checked, risks and benefits discussed, surgical consent, monitors and equipment checked, pre-op evaluation and timeout performed Spinal Block Patient position: sitting Prep: DuraPrep and site prepped and draped Patient monitoring: continuous pulse ox, blood pressure and heart rate Approach: right paramedian Location: L3-4 Injection technique: single-shot Needle Needle type: Pencan  Needle gauge: 24 G Needle length: 9 cm Assessment Events: CSF return Additional Notes Risks, benefits, and alternative discussed. Patient gave consent to procedure. Prepped and draped in sitting position. Patient sedated but responsive to voice. Clear CSF obtained after one needle redirection. Positive terminal aspiration. No pain or paraesthesias with injection. Patient tolerated procedure well. Vital signs stable. Amalia Greenhouse, MD

## 2023-06-14 NOTE — Discharge Instructions (Signed)
Dr. Samson Frederic Joint Replacement Specialist [facility] [address] [place], [place] [address] [phone]     WEIGHT BEARING Partial weight bearing with assist device as directed.  50% weightbearing RLE.   The results of a hip operation are greatly improved after range of motion and muscle strengthening exercises. Follow all safety measures which are given to protect your hip. If any of these exercises cause increased pain or swelling in your joint, decrease the amount until you are comfortable again. Then slowly increase the exercises. Call your caregiver if you have problems or questions.   HOME CARE INSTRUCTIONS  Most of the following instructions are designed to prevent the dislocation of your new hip.  Remove items at home which could result in a fall. This includes throw rugs or furniture in walking pathways.  Continue medications as instructed at time of discharge. You may have some home medications which will be placed on hold until you complete the course of blood thinner medication. You may start showering once you are discharged home. Do not remove your dressing. Do not put on socks or shoes without following the instructions of your caregivers.   Sit on chairs with arms. Use the chair arms to help push yourself up when arising.  Arrange for the use of a toilet seat elevator so you are not sitting low.  Walk with walker as instructed.  You may resume a sexual relationship in one month or when given the OK by your caregiver.  Use walker as long as suggested by your caregivers.  You may put full weight on your legs and walk as much as is comfortable. Avoid periods of inactivity such as sitting longer than an hour when not asleep. This helps prevent blood clots.  You may return to work once you are cleared by Designer, industrial/product.  Do not drive a car for 6 weeks or until released by your surgeon.  Do not drive while taking narcotics.  Wear elastic  stockings for two weeks following surgery during the day but you may remove then at night.  Make sure you keep all of your appointments after your operation with all of your doctors and caregivers. You should call the office at the above phone number and make an appointment for approximately two weeks after the date of your surgery. Please pick up a stool softener and laxative for home use as long as you are requiring pain medications. ICE to the affected hip every three hours for 30 minutes at a time and then as needed for pain and swelling. Continue to use ice on the hip for pain and swelling from surgery. You may notice swelling that will progress down to the foot and ankle.  This is normal after surgery.  Elevate the leg when you are not up walking on it.   It is important for you to complete the blood thinner medication as prescribed by your doctor. Continue to use the breathing machine which will help keep your temperature down.  It is common for your temperature to cycle up and down following surgery, especially at night when you are not up moving around and exerting yourself.  The breathing machine keeps your lungs expanded and your temperature down.  RANGE OF MOTION AND STRENGTHENING EXERCISES  These exercises are designed to help you keep full movement of your hip joint. Follow your caregiver's or physical therapist's instructions. Perform all exercises about fifteen times, three times per day or as directed. Exercise  both hips, even if you have had only one joint replacement. These exercises can be done on a training (exercise) mat, on the floor, on a table or on a bed. Use whatever works the best and is most comfortable for you. Use music or television while you are exercising so that the exercises are a pleasant break in your day. This will make your life better with the exercises acting as a break in routine you can look forward to.  Lying on your back, slowly slide your foot toward your  buttocks, raising your knee up off the floor. Then slowly slide your foot back down until your leg is straight again.  Lying on your back spread your legs as far apart as you can without causing discomfort.  Lying on your side, raise your upper leg and foot straight up from the floor as far as is comfortable. Slowly lower the leg and repeat.  Lying on your back, tighten up the muscle in the front of your thigh (quadriceps muscles). You can do this by keeping your leg straight and trying to raise your heel off the floor. This helps strengthen the largest muscle supporting your knee.  Lying on your back, tighten up the muscles of your buttocks both with the legs straight and with the knee bent at a comfortable angle while keeping your heel on the floor.     POST-OPERATIVE OPIOID TAPER INSTRUCTIONS: It is important to wean off of your opioid medication as soon as possible. If you do not need pain medication after your surgery it is ok to stop day one. Opioids include: Codeine, Hydrocodone(Norco, Vicodin), Oxycodone(Percocet, oxycontin) and hydromorphone amongst others.  Long term and even short term use of opiods can cause: Increased pain response Dependence Constipation Depression Respiratory depression And more.  Withdrawal symptoms can include Flu like symptoms Nausea, vomiting And more Techniques to manage these symptoms Hydrate well Eat regular healthy meals Stay active Use relaxation techniques(deep breathing, meditating, yoga) Do Not substitute Alcohol to help with tapering If you have been on opioids for less than two weeks and do not have pain than it is ok to stop all together.  Plan to wean off of opioids This plan should start within one week post op of your joint replacement. Maintain the same interval or time between taking each dose and first decrease the dose.  Cut the total daily intake of opioids by one tablet each day Next start to increase the time between  doses. The last dose that should be eliminated is the evening dose.    MAKE SURE YOU:  Understand these instructions.  Will watch your condition.  Will get help right away if you are not doing well or get worse.  Pick up stool softner and laxative for home use following surgery while on pain medications. Do not remove your dressing. The dressing is waterproof--it is OK to take showers. Continue to use ice for pain and swelling after surgery. Do not use any lotions or creams on the incision until instructed by your surgeon.

## 2023-06-14 NOTE — Plan of Care (Signed)
  Problem: Education: Goal: Knowledge of General Education information will improve Description: Including pain rating scale, medication(s)/side effects and non-pharmacologic comfort measures Outcome: Progressing   Problem: Activity: Goal: Risk for activity intolerance will decrease Outcome: Progressing   Problem: Pain Management: Goal: General experience of comfort will improve Outcome: Progressing

## 2023-06-14 NOTE — Transfer of Care (Signed)
Immediate Anesthesia Transfer of Care Note  Patient: Ariel Cobb  Procedure(s) Performed: HARDWARE REMOVAL (Right)  Patient Location: PACU  Anesthesia Type:MAC and Spinal  Level of Consciousness: drowsy  Airway & Oxygen Therapy: Patient Spontanous Breathing and Patient connected to face mask oxygen  Post-op Assessment: Report given to RN and Post -op Vital signs reviewed and stable  Post vital signs: Reviewed and stable  Last Vitals:  Vitals Value Taken Time  BP 137/66 06/14/23 1621  Temp    Pulse 51 06/14/23 1623  Resp 13 06/14/23 1623  SpO2 100 % 06/14/23 1623  Vitals shown include unfiled device data.  Last Pain:  Vitals:   06/14/23 1235  TempSrc:   PainSc: 0-No pain         Complications: No notable events documented.

## 2023-06-15 ENCOUNTER — Encounter (HOSPITAL_COMMUNITY): Payer: Self-pay | Admitting: Orthopedic Surgery

## 2023-06-15 DIAGNOSIS — T8484XA Pain due to internal orthopedic prosthetic devices, implants and grafts, initial encounter: Secondary | ICD-10-CM | POA: Diagnosis not present

## 2023-06-15 LAB — BASIC METABOLIC PANEL
Anion gap: 11 (ref 5–15)
BUN: 37 mg/dL — ABNORMAL HIGH (ref 8–23)
CO2: 22 mmol/L (ref 22–32)
Calcium: 8.9 mg/dL (ref 8.9–10.3)
Chloride: 103 mmol/L (ref 98–111)
Creatinine, Ser: 1.43 mg/dL — ABNORMAL HIGH (ref 0.44–1.00)
GFR, Estimated: 37 mL/min — ABNORMAL LOW (ref >60)
Glucose, Bld: 136 mg/dL — ABNORMAL HIGH (ref 70–99)
Potassium: 3.8 mmol/L (ref 3.5–5.1)
Sodium: 136 mmol/L (ref 135–145)

## 2023-06-15 LAB — GLUCOSE, CAPILLARY
Glucose-Capillary: 139 mg/dL — ABNORMAL HIGH (ref 70–99)
Glucose-Capillary: 173 mg/dL — ABNORMAL HIGH (ref 70–99)

## 2023-06-15 LAB — CBC
HCT: 29.2 % — ABNORMAL LOW (ref 36.0–46.0)
Hemoglobin: 9.3 g/dL — ABNORMAL LOW (ref 12.0–15.0)
MCH: 32.3 pg (ref 26.0–34.0)
MCHC: 31.8 g/dL (ref 30.0–36.0)
MCV: 101.4 fL — ABNORMAL HIGH (ref 80.0–100.0)
Platelets: 322 10*3/uL (ref 150–400)
RBC: 2.88 MIL/uL — ABNORMAL LOW (ref 3.87–5.11)
RDW: 14.6 % (ref 11.5–15.5)
WBC: 10.2 10*3/uL (ref 4.0–10.5)
nRBC: 0 % (ref 0.0–0.2)

## 2023-06-15 MED ORDER — DOCUSATE SODIUM 100 MG PO CAPS: 100.0000 mg | ORAL_CAPSULE | Freq: Two times a day (BID) | ORAL | 0 refills | Status: DC

## 2023-06-15 MED ORDER — ONDANSETRON HCL 4 MG PO TABS: 4.0000 mg | ORAL_TABLET | Freq: Three times a day (TID) | ORAL | 0 refills | Status: DC | PRN

## 2023-06-15 MED ORDER — HYDROCODONE-ACETAMINOPHEN 5-325 MG PO TABS: 1.0000 | ORAL_TABLET | ORAL | 0 refills | Status: AC | PRN

## 2023-06-15 MED ORDER — POLYETHYLENE GLYCOL 3350 17 G PO PACK: 17.0000 g | PACK | Freq: Every day | ORAL | 0 refills | Status: DC | PRN

## 2023-06-15 MED ORDER — SENNA 8.6 MG PO TABS
2.0000 | ORAL_TABLET | Freq: Every day | ORAL | 0 refills | Status: AC
Start: 2023-06-15 — End: 2023-06-30

## 2023-06-15 NOTE — Progress Notes (Addendum)
Physical Therapy Treatment Patient Details Name: Ariel Cobb MRN: [id number] DOB: 02-15-1946 Today's Date: 06/15/2023   History of Present Illness 77 yo female s/p R posterior THA on 06/14/23. PMH: arthritis, HTN, HLD, PAD, jx R hip fx-s/p IM nail 2023.    PT Comments  Pt progressing well this session, meeting goals. Incr gait distance without incr pain,  reviewed THA HEP. Pt reports her sister and nephew assist prn at home. Pt states she is going to use the ramp to enter home, discussed that we would review steps here if pt plans to go to entry with 2 steps, one hand rail (reviewed the need for support on each side d/t PWB), pt prefers ramp.    If plan is discharge home, recommend the following: A little help with walking and/or transfers;A little help with bathing/dressing/bathroom;Help with stairs or ramp for entrance;Assistance with cooking/housework   Can travel by private vehicle        Equipment Recommendations  None recommended by PT    Recommendations for Other Services       Precautions / Restrictions Precautions Precautions: Fall Restrictions Weight Bearing Restrictions Per Provider Order: Yes RLE Weight Bearing Per Provider Order: Partial weight bearing RLE Partial Weight Bearing Percentage or Pounds: 50%     Mobility  Bed Mobility Overal bed mobility: Needs Assistance Bed Mobility: Supine to Sit     Supine to sit: Min assist     General bed mobility comments: incr time and effort, cues for sequence, and use of gait belt as leg lifter    Transfers Overall transfer level: Needs assistance Equipment used: Rolling walker (2 wheels) Transfers: Sit to/from Stand Sit to Stand: Contact guard assist, Supervision           General transfer comment: cues for hand placement, CGA-close supervision for safety with transition to RW    Ambulation/Gait Ambulation/Gait assistance: Supervision, Contact guard assist Gait Distance (Feet): 60 Feet Assistive  device: Rolling walker (2 wheels) Gait Pattern/deviations: Step-to pattern       General Gait Details: cues for sequence and PWB, pt adheres to   [facility]   Stairs             Wheelchair Mobility     Tilt Bed    Modified Rankin (Stroke Patients Only)       Balance Overall balance assessment: History of Falls, Needs assistance Sitting-balance support: Feet supported, No upper extremity supported Sitting balance-Leahy Scale: Fair     Standing balance support: Reliant on assistive device for balance, During functional activity Standing balance-Leahy Scale: Poor                              Cognition Arousal: Alert Behavior During Therapy: WFL for tasks assessed/performed Overall Cognitive Status: Within Functional Limits for tasks assessed                                 General Comments: ? some baseline cognitive deficits; denies falls then tells PT she fell getting OOB to answer phone recently        Exercises Total Joint Exercises Ankle Circles/Pumps: AROM, Both, 10 reps Quad Sets: AROM, Strengthening, Both Heel Slides: AAROM, Right, 5 reps    General Comments        Pertinent Vitals/Pain Pain Assessment Pain Assessment: Faces Faces Pain Scale: Hurts little more Pain Location: right hip Pain Descriptors /  Indicators: Grimacing, Guarding, Discomfort Pain Intervention(s): Limited activity within patient's tolerance, Monitored during session, Premedicated before session, Repositioned    Home Living                          Prior Function            PT Goals (current goals can now be found in the care plan section) Acute Rehab PT Goals Patient Stated Goal: less pain PT Goal Formulation: With patient Time For Goal Achievement: 06/22/23 Potential to Achieve Goals: Good Progress towards PT goals: Progressing toward goals    Frequency    7X/week      PT Plan      Co-evaluation              AM-PAC  PT "6 Clicks" Mobility   Outcome Measure  Help needed turning from your back to your side while in a flat bed without using bedrails?: A Little Help needed moving from lying on your back to sitting on the side of a flat bed without using bedrails?: A Little Help needed moving to and from a bed to a chair (including a wheelchair)?: A Little Help needed standing up from a chair using your arms (e.g., wheelchair or bedside chair)?: A Little Help needed to walk in hospital room?: A Little Help needed climbing 3-5 steps with a railing? : A Little 6 Click Score: 18    End of Session Equipment Utilized During Treatment: Gait belt Activity Tolerance: Patient tolerated treatment well Patient left: in chair;with call bell/phone within reach Nurse Communication: Mobility status PT Visit Diagnosis: Other abnormalities of gait and mobility (R26.89);Difficulty in walking, not elsewhere classified (R26.2)     Time: 4742-5956 PT Time Calculation (min) (ACUTE ONLY): 16 min  Charges:    $Gait Training: 8-22 mins PT General Charges $$ ACUTE PT VISIT: 1 Visit                     Nicholos Aloisi, PT  Acute Rehab Dept [facility]) [phone]  06/15/2023    [facility] 06/15/2023, 3:48 PM

## 2023-06-15 NOTE — Evaluation (Addendum)
Physical Therapy Evaluation Patient Details Name: Ariel Cobb MRN: [id number] DOB: [birth year] Today's Date: 06/15/2023  History of Present Illness  77 yo female s/p R posterior THA on 06/14/23. PMH: arthritis, HTN, HLD, PAD, jx R hip fx-s/p IM nail 2023.  Clinical Impression  Pt is s/p THA resulting in the deficits listed below (see PT Problem List).  Pt reports mod I at baseline. Amb ~ 22' with min assist, reported dizziness initially and this subsided. Will see again this pm, pt will likely be able to d/c home later today from PT standpoint  Pt will benefit from acute skilled PT to increase their independence and safety with mobility to facilitate discharge.          If plan is discharge home, recommend the following: A little help with walking and/or transfers;A little help with bathing/dressing/bathroom;Help with stairs or ramp for entrance;Assistance with cooking/housework   Can travel by private vehicle        Equipment Recommendations None recommended by PT  Recommendations for Other Services       Functional Status Assessment Patient has had a recent decline in their functional status and demonstrates the ability to make significant improvements in function in a reasonable and predictable amount of time.     Precautions / Restrictions Precautions Precautions: Fall Restrictions Weight Bearing Restrictions Per Provider Order: Yes RLE Weight Bearing Per Provider Order: Partial weight bearing RLE Partial Weight Bearing Percentage or Pounds: 50%  Per PA progress note pt with  "limited hip ROM" at baseline, no formal precautions ordered     Mobility  Bed Mobility Overal bed mobility: Needs Assistance Bed Mobility: Supine to Sit     Supine to sit: Min assist     General bed mobility comments: incr time and effort, cues for sequence, assist with RL E    Transfers Overall transfer level: Needs assistance Equipment used: Rolling walker (2 wheels) Transfers:  Sit to/from Stand Sit to Stand: Min assist           General transfer comment: cues for hand placement, assist to rise and transition to RW    Ambulation/Gait Ambulation/Gait assistance: Contact guard assist Gait Distance (Feet): 50 Feet Assistive device: Rolling walker (2 wheels) Gait Pattern/deviations: Step-to pattern       General Gait Details: cues for sequence and PWB  Stairs            Wheelchair Mobility     Tilt Bed    Modified Rankin (Stroke Patients Only)       Balance Overall balance assessment: History of Falls, Needs assistance Sitting-balance support: Feet supported, No upper extremity supported Sitting balance-Leahy Scale: Fair     Standing balance support: Reliant on assistive device for balance, During functional activity Standing balance-Leahy Scale: Poor                               Pertinent Vitals/Pain      Home Living Family/patient expects to be discharged to:: Private residence Living Arrangements: Other relatives (sister and nephew) Available Help at Discharge: Family Type of Home: House Home Access: Stairs to enter   Secretary/administrator of Steps: 2   Home Layout: One level Home Equipment: Cane - single Information systems manager (2 wheels)      Prior Function Prior Level of Function : Independent/Modified Independent             Mobility Comments: amb with RW for 2 years  per pt       Extremity/Trunk Assessment   Upper Extremity Assessment Upper Extremity Assessment: Generalized weakness    Lower Extremity Assessment Lower Extremity Assessment: Generalized weakness       Communication   Communication Communication: No apparent difficulties  Cognition Arousal: Alert Behavior During Therapy: WFL for tasks assessed/performed                                   General Comments: ? some baseline cognitive deficits; denies falls then tells PT she fell getting OOB to answer  phone recently        General Comments      Exercises     Assessment/Plan    PT Assessment Patient needs continued PT services  PT Problem List Decreased strength;Decreased activity tolerance;Decreased balance;Decreased mobility;Decreased knowledge of precautions;Decreased safety awareness;Decreased range of motion       PT Treatment Interventions DME instruction;Gait training;Functional mobility training;Therapeutic activities;Therapeutic exercise;Patient/family education    PT Goals (Current goals can be found in the Care Plan section)  Acute Rehab PT Goals Patient Stated Goal: less pain PT Goal Formulation: With patient Time For Goal Achievement: 06/22/23 Potential to Achieve Goals: Good    Frequency 7X/week     Co-evaluation               AM-PAC PT "6 Clicks" Mobility  Outcome Measure Help needed turning from your back to your side while in a flat bed without using bedrails?: A Little Help needed moving from lying on your back to sitting on the side of a flat bed without using bedrails?: A Little Help needed moving to and from a bed to a chair (including a wheelchair)?: A Little Help needed standing up from a chair using your arms (e.g., wheelchair or bedside chair)?: A Little Help needed to walk in hospital room?: A Little Help needed climbing 3-5 steps with a railing? : A Little 6 Click Score: 18    End of Session Equipment Utilized During Treatment: Gait belt Activity Tolerance: Patient tolerated treatment well Patient left: with call bell/phone within reach;in chair (chair set up without alarm)   PT Visit Diagnosis: Other abnormalities of gait and mobility (R26.89);Difficulty in walking, not elsewhere classified (R26.2)    Time: 6237-6283 PT Time Calculation (min) (ACUTE ONLY): 24 min   Charges:     PT Treatments $Gait Training: 8-22 mins PT General Charges $$ ACUTE PT VISIT: 1 Visit         Kalieb Freeland, PT  Acute Rehab Dept [facility])  [phone]  06/15/2023   [facility] 06/15/2023, 11:02 AM

## 2023-06-15 NOTE — Plan of Care (Signed)
  Problem: Education: Goal: Knowledge of General Education information will improve Description: Including pain rating scale, medication(s)/side effects and non-pharmacologic comfort measures Outcome: Progressing   Problem: Health Behavior/Discharge Planning: Goal: Ability to manage health-related needs will improve Outcome: Progressing   Problem: Clinical Measurements: Goal: Ability to maintain clinical measurements within normal limits will improve Outcome: Progressing Goal: Will remain free from infection Outcome: Progressing Goal: Diagnostic test results will improve Outcome: Progressing Goal: Respiratory complications will improve Outcome: Progressing Goal: Cardiovascular complication will be avoided Outcome: Progressing   Problem: Activity: Goal: Risk for activity intolerance will decrease Outcome: Progressing   Problem: Nutrition: Goal: Adequate nutrition will be maintained Outcome: Completed/Met   Problem: Coping: Goal: Level of anxiety will decrease Outcome: Progressing   Problem: Elimination: Goal: Will not experience complications related to bowel motility Outcome: Progressing Goal: Will not experience complications related to urinary retention Outcome: Progressing   Problem: Pain Management: Goal: General experience of comfort will improve Outcome: Progressing   Problem: Safety: Goal: Ability to remain free from injury will improve Outcome: Progressing   Problem: Skin Integrity: Goal: Risk for impaired skin integrity will decrease Outcome: Progressing   Problem: Education: Goal: Knowledge of the prescribed therapeutic regimen will improve Outcome: Progressing Goal: Individualized Educational Video(s) Outcome: Completed/Met   Problem: Activity: Goal: Ability to avoid complications of mobility impairment will improve Outcome: Progressing Goal: Ability to tolerate increased activity will improve Outcome: Progressing   Problem: Clinical  Measurements: Goal: Postoperative complications will be avoided or minimized Outcome: Progressing   Problem: Pain Management: Goal: Pain level will decrease with appropriate interventions Outcome: Progressing   Problem: Skin Integrity: Goal: Will show signs of wound healing Outcome: Progressing

## 2023-06-15 NOTE — Anesthesia Postprocedure Evaluation (Signed)
Anesthesia Post Note  Patient: Ariel Cobb  Procedure(s) Performed: HARDWARE REMOVAL (Right)     Patient location during evaluation: PACU Anesthesia Type: Spinal Level of consciousness: awake and alert Pain management: pain level controlled Vital Signs Assessment: post-procedure vital signs reviewed and stable Respiratory status: spontaneous breathing, nonlabored ventilation and respiratory function stable Cardiovascular status: blood pressure returned to baseline Postop Assessment: no apparent nausea or vomiting, spinal receding, no headache and no backache Anesthetic complications: no   No notable events documented.            Shanda Howells

## 2023-06-15 NOTE — Progress Notes (Signed)
    Subjective:  Patient reports pain as mild to moderate.  Denies N/V/CP/SOB/Abd pain. Reports soreness in RLE. Neuropathy symptoms in right foot this morning per baseline.   Discussed WB status as 50% WB RLE.   Objective:   VITALS:   Vitals:   06/15/23 0139 06/15/23 0615 06/15/23 0902 06/15/23 0955  BP: (!) 143/48 (!) 168/52  (!) 179/54  Pulse: 68 64  60  Resp: 18 18  17   Temp: 98.4 F (36.9 C) 98.3 F (36.8 C)  98.1 F (36.7 C)  TempSrc: Oral Oral  Oral  SpO2: 97% 98% 97% 99%  Weight:      Height:        NAD Neurologically intact ABD soft Neurovascular intact Sensation intact distally Intact pulses distally Dorsiflexion/Plantar flexion intact Incision: dressing C/D/I No cellulitis present Compartment soft   Lab Results  Component Value Date   WBC 10.2 06/15/2023   HGB 9.3 (L) 06/15/2023   HCT 29.2 (L) 06/15/2023   MCV 101.4 (H) 06/15/2023   PLT 322 06/15/2023   BMET    Component Value Date/Time   NA 136 06/15/2023 0348   NA 142 01/30/2023 1001   K 3.8 06/15/2023 0348   CL 103 06/15/2023 0348   CO2 22 06/15/2023 0348   GLUCOSE 136 (H) 06/15/2023 0348   BUN 37 (H) 06/15/2023 0348   BUN 31 (H) 01/30/2023 1001   CREATININE 1.43 (H) 06/15/2023 0348   CREATININE 1.42 (H) 07/24/2013 1236   CALCIUM 8.9 06/15/2023 0348   EGFR 42 (L) 01/30/2023 1001   GFRNONAA 37 (L) 06/15/2023 0348     Assessment/Plan: 1 Day Post-Op   Principal Problem:   Avascular necrosis of bone of right hip (HCC)  ABLA. Hemoglobin 9.3. Continue to monitor.   50% WB RLE with walker DVT ppx:  Eliquis , SCDs, TEDS PO pain control PT/OT: To come today. Motion in RLE limited per baseline due to AVN right hip Dispo:  - D/c home with HEP once cleared with PT and voided.    Clois Dupes 06/15/2023, 10:39 AM   [facility]  [place] [address], [place], [place] [address] Phone: [phone] [email or web address] Facebook  [employer]

## 2023-06-15 NOTE — TOC Transition Note (Signed)
Transition of Care [facility]) - Discharge Note   Patient Details  Name: Ariel Cobb MRN: [id number] Date of Birth: 08-08-1945  Transition of Care [facility]) CM/SW Contact:  Amada Jupiter, LCSW Phone Number: 06/15/2023, 10:19 AM   Clinical Narrative:     Met with pt who confirms she has needed DME in the home.  Plan for HEP.  No further TOC needs.  Final next level of care: Home/Self Care Barriers to Discharge: No Barriers Identified   Patient Goals and CMS Choice Patient states their goals for this hospitalization and ongoing recovery are:: return home          Discharge Placement                       Discharge Plan and Services Additional resources added to the After Visit Summary for                  DME Arranged: N/A DME Agency: NA                  Social Drivers of Health (SDOH) Interventions SDOH Screenings   Food Insecurity: Patient Declined (06/14/2023)  Housing: Patient Declined (06/14/2023)  Transportation Needs: Unknown (06/14/2023)  Tobacco Use: High Risk (06/14/2023)     Readmission Risk Interventions     No data to display

## 2023-06-15 NOTE — Care Management Important Message (Signed)
Important Message  Patient Details IM Letter given. Name: AKACIA UHER MRN: [id number] Date of Birth: 07-Aug-1945   Important Message Given:  Yes - Medicare IM     Caren Macadam 06/15/2023, 3:04 PM

## 2023-06-16 NOTE — Discharge Summary (Signed)
Physician Discharge Summary  Patient ID: Ariel Cobb MRN: [id number] DOB/AGE: 11-05-1945 77 y.o.  Admit date: 06/14/2023 Discharge date: 06/15/2023  Admission Diagnoses:  Avascular necrosis of bone of right hip [facility])  Discharge Diagnoses:  Principal Problem:   Avascular necrosis of bone of right hip [facility])   Past Medical History:  Diagnosis Date   A-fib (HCC)    Anemia    Arthritis    Chronic bronchitis (HCC)    Chronic kidney disease    sees dr sanford yearly stage 3 stable   Claudication (HCC)    Complication of anesthesia 2000   slow to awaken after hip surgery   COPD (chronic obstructive pulmonary disease) (HCC)    Depression    Diabetes mellitus without complication (HCC)    type 1   Dysrhythmia    Hyperlipidemia    Hypertension    Hypothyroidism    Impetigo    Neuromuscular disorder (HCC)    peripheral neuropathy   Osteoporosis    PAD (peripheral artery disease) (HCC)    a. 07/29/13: s/p PV angiogram with successful diamondback orbital rotational atherectomy of the high-grade calcified R popliteal artery stenosis   Shortness of breath    on exertion   Thyroid disease     Surgeries: Procedure(s): HARDWARE REMOVAL on 06/14/2023   Consultants (if any):   Discharged Condition: Improved  Hospital Course: Ariel Cobb is an 77 y.o. female who was admitted 06/14/2023 with a diagnosis of Avascular necrosis of bone of right hip (HCC) and went to the operating room on 06/14/2023 and underwent the above named procedures.    She was given perioperative antibiotics:  Anti-infectives (From admission, onward)    Start     Dose/Rate Route Frequency Ordered Stop   06/14/23 2000  ceFAZolin (ANCEF) IVPB 2g/100 mL premix        2 g 200 mL/hr over 30 Minutes Intravenous Every 6 hours 06/14/23 1825 06/15/23 0208   06/14/23 1215  ceFAZolin (ANCEF) IVPB 2g/100 mL premix        2 g 200 mL/hr over 30 Minutes Intravenous On call to O.R. 06/14/23 1203 06/14/23 1357        She was given sequential compression devices, early ambulation, and Eliquis for DVT prophylaxis.  POD#1  50% WB RLE. ABLA. Hemoglobin 9.3 asymptomatic. She ambulated 50 and 60 feet. D/c home with HEP.   She benefited maximally from the hospital stay and there were no complications.    Recent vital signs:  Vitals:   06/15/23 0955 06/15/23 1434  BP: (!) 179/54 (!) 164/63  Pulse: 60 (!) 58  Resp: 17 16  Temp: 98.1 F (36.7 C) 98.9 F (37.2 C)  SpO2: 99% 100%    Recent laboratory studies:  Lab Results  Component Value Date   HGB 9.3 (L) 06/15/2023   HGB 11.0 (L) 06/07/2023   HGB 11.5 (L) 12/06/2022   Lab Results  Component Value Date   WBC 10.2 06/15/2023   PLT 322 06/15/2023   Lab Results  Component Value Date   INR 1.2 08/05/2021   Lab Results  Component Value Date   NA 136 06/15/2023   K 3.8 06/15/2023   CL 103 06/15/2023   CO2 22 06/15/2023   BUN 37 (H) 06/15/2023   CREATININE 1.43 (H) 06/15/2023   GLUCOSE 136 (H) 06/15/2023     Allergies as of 06/15/2023       Reactions   Adhesive [tape] Itching   Surgical Tape - "makes me itch  and break me out" -- HAS TO USE PAPER TAPE   Amoxicillin Hives, Itching, Swelling   Fentanyl Other (See Comments)   Latex Itching   Other Other (See Comments)   Pioglitazone Other (See Comments)        Medication List     TAKE these medications    acetaminophen 500 MG tablet Commonly known as: TYLENOL Take 500 mg by mouth every 6 (six) hours as needed for moderate pain (pain score 4-6).   albuterol 108 (90 Base) MCG/ACT inhaler Commonly known as: VENTOLIN HFA Inhale 2 puffs into the lungs every 6 (six) hours as needed for wheezing or shortness of breath.   atorvastatin 80 MG tablet Commonly known as: LIPITOR TAKE 1 TABLET BY MOUTH EVERY MORNING.   Basaglar KwikPen 100 UNIT/ML Inject 8 Units into the skin daily.   budesonide-formoterol 160-4.5 MCG/ACT inhaler Commonly known as: SYMBICORT Inhale 2 puffs  into the lungs at bedtime.   clobetasol 0.05 % external solution Commonly known as: TEMOVATE Apply 1 Application topically 2 (two) times daily as needed (psoriasis).   docusate sodium 100 MG capsule Commonly known as: Colace Take 1 capsule (100 mg total) by mouth 2 (two) times daily.   Eliquis 2.5 MG Tabs tablet Generic drug: apixaban TAKE 1 TABLET BY MOUTH TWICE A DAY   hydrochlorothiazide 25 MG tablet Commonly known as: HYDRODIURIL Take 25 mg by mouth daily.   HYDROcodone-acetaminophen 5-325 MG tablet Commonly known as: NORCO/VICODIN Take 1 tablet by mouth every 4 (four) hours as needed for up to 7 days for moderate pain (pain score 4-6) or severe pain (pain score 7-10).   levothyroxine 100 MCG tablet Commonly known as: SYNTHROID Take 88 mcg by mouth daily before breakfast.   NovoLOG FlexPen 100 UNIT/ML FlexPen Generic drug: insulin aspart Inject 5-7 Units into the skin See admin instructions. Inject 5 units under the skin in the morning, 6 units ,at lunch and 7 units with dinner   ondansetron 4 MG tablet Commonly known as: Zofran Take 1 tablet (4 mg total) by mouth every 8 (eight) hours as needed for nausea or vomiting.   ONE TOUCH ULTRA TEST test strip Generic drug: glucose blood   polyethylene glycol 17 g packet Commonly known as: MiraLax Take 17 g by mouth daily as needed for mild constipation or moderate constipation.   ramipril 2.5 MG capsule Commonly known as: ALTACE Take 1 capsule (2.5 mg total) by mouth daily.   senna 8.6 MG Tabs tablet Commonly known as: SENOKOT Take 2 tablets (17.2 mg total) by mouth at bedtime for 15 days.               Discharge Care Instructions  (From admission, onward)           Start     Ordered   06/15/23 0000  Partial weight bearing       Question Answer Comment  % Body Weight 50   Laterality right   Extremity Lower      06/15/23 1047   06/15/23 0000  Change dressing       Comments: Do not change your  dressing.   06/15/23 1047              WEIGHT BEARING   Partial weight bearing with assist device as directed.  50% weightbearing RLE.    EXERCISES  Results after joint replacement surgery are often greatly improved when you follow the exercise, range of motion and muscle strengthening exercises prescribed by your doctor.  Safety measures are also important to protect the joint from further injury. Any time any of these exercises cause you to have increased pain or swelling, decrease what you are doing until you are comfortable again and then slowly increase them. If you have problems or questions, call your caregiver or physical therapist for advice.   Rehabilitation is important following a joint replacement. After just a few days of immobilization, the muscles of the leg can become weakened and shrink (atrophy).  These exercises are designed to build up the tone and strength of the thigh and leg muscles and to improve motion. Often times heat used for twenty to thirty minutes before working out will loosen up your tissues and help with improving the range of motion but do not use heat for the first two weeks following surgery (sometimes heat can increase post-operative swelling).   These exercises can be done on a training (exercise) mat, on the floor, on a table or on a bed. Use whatever works the best and is most comfortable for you.    Use music or television while you are exercising so that the exercises are a pleasant break in your day. This will make your life better with the exercises acting as a break in your routine that you can look forward to.   Perform all exercises about fifteen times, three times per day or as directed.  You should exercise both the operative leg and the other leg as well.  Exercises include:   Quad Sets - Tighten up the muscle on the front of the thigh (Quad) and hold for 5-10 seconds.   Straight Leg Raises - With your knee straight (if you were given a  brace, keep it on), lift the leg to 60 degrees, hold for 3 seconds, and slowly lower the leg.  Perform this exercise against resistance later as your leg gets stronger.  Leg Slides: Lying on your back, slowly slide your foot toward your buttocks, bending your knee up off the floor (only go as far as is comfortable). Then slowly slide your foot back down until your leg is flat on the floor again.  Angel Wings: Lying on your back spread your legs to the side as far apart as you can without causing discomfort.  Hamstring Strength:  Lying on your back, push your heel against the floor with your leg straight by tightening up the muscles of your buttocks.  Repeat, but this time bend your knee to a comfortable angle, and push your heel against the floor.  You may put a pillow under the heel to make it more comfortable if necessary.   A rehabilitation program following joint replacement surgery can speed recovery and prevent re-injury in the future due to weakened muscles. Contact your doctor or a physical therapist for more information on knee rehabilitation.    CONSTIPATION  Constipation is defined medically as fewer than three stools per week and severe constipation as less than one stool per week.  Even if you have a regular bowel pattern at home, your normal regimen is likely to be disrupted due to multiple reasons following surgery.  Combination of anesthesia, postoperative narcotics, change in appetite and fluid intake all can affect your bowels.   YOU MUST use at least one of the following options; they are listed in order of increasing strength to get the job done.  They are all available over the counter, and you may need to use some, POSSIBLY even all of these options:  Drink plenty of fluids (prune juice may be helpful) and high fiber foods Colace 100 mg by mouth twice a day  Senokot for constipation as directed and as needed Dulcolax (bisacodyl), take with full glass of water  Miralax  (polyethylene glycol) once or twice a day as needed.  If you have tried all these things and are unable to have a bowel movement in the first 3-4 days after surgery call either your surgeon or your primary doctor.    If you experience loose stools or diarrhea, hold the medications until you stool forms back up.  If your symptoms do not get better within 1 week or if they get worse, check with your doctor.  If you experience "the worst abdominal pain ever" or develop nausea or vomiting, please contact the office immediately for further recommendations for treatment.   ITCHING:  If you experience itching with your medications, try taking only a single pain pill, or even half a pain pill at a time.  You can also use Benadryl over the counter for itching or also to help with sleep.   TED HOSE STOCKINGS:  Use stockings on both legs until for at least 2 weeks or as directed by physician office. They may be removed at night for sleeping.  MEDICATIONS:  See your medication summary on the "After Visit Summary" that nursing will review with you.  You may have some home medications which will be placed on hold until you complete the course of blood thinner medication.  It is important for you to complete the blood thinner medication as prescribed.  PRECAUTIONS:  If you experience chest pain or shortness of breath - call 911 immediately for transfer to the hospital emergency department.   If you develop a fever greater that 101 F, purulent drainage from wound, increased redness or drainage from wound, foul odor from the wound/dressing, or calf pain - CONTACT YOUR SURGEON.                                                   FOLLOW-UP APPOINTMENTS:  If you do not already have a post-op appointment, please call the office for an appointment to be seen by your surgeon.  Guidelines for how soon to be seen are listed in your "After Visit Summary", but are typically between 1-4 weeks after surgery.  OTHER  INSTRUCTIONS:   Knee Replacement:  Do not place pillow under knee, focus on keeping the knee straight while resting. CPM instructions: 0-90 degrees, 2 hours in the morning, 2 hours in the afternoon, and 2 hours in the evening. Place foam block, curve side up under heel at all times except when in CPM or when walking.  DO NOT modify, tear, cut, or change the foam block in any way.   MAKE SURE YOU:  Understand these instructions.  Get help right away if you are not doing well or get worse.    Thank you for letting us be a part of your medical care team.  It is a privilege we respect greatly.  We hope these instructions will help you stay on track for a fast and full recovery!   Diagnostic Studies: DG Pelvis Portable Result Date: 06/14/2023 CLINICAL DATA:  Avascular necrosis of the right hip. EXAM: PORTABLE PELVIS 1-2 VIEWS COMPARISON:  Bilateral hip x-ray 11/18/2022. FINDINGS: Right hip screw  and femoral nail have been removed in the interval. There is lobulated deformity of the superior aspect of the right humeral head with some fragmentation. There is no dislocation. There is mild-to-moderate joint space narrowing. There are lateral skin staples and soft tissue air compatible with recent surgery. Left hip arthroplasty appears uncomplicated. The bones are diffusely osteopenic. Calcifications in the pelvis are likely related to fibroids, unchanged. IMPRESSION: 1. Interval removal of right hip screw and femoral nail. 2. Lobulated deformity of the superior aspect of the right humeral head with some fragmentation suspicious for avascular necrosis. Electronically Signed   By: Darliss Cheney M.D.   On: 06/14/2023 20:32   DG HIP UNILAT WITH PELVIS 2-3 VIEWS RIGHT Result Date: 06/14/2023 CLINICAL DATA:  Intraoperative fluoroscopy for right hip hardware removal. EXAM: DG HIP (WITH OR WITHOUT PELVIS) 2-3V RIGHT COMPARISON:  Bilateral hip radiographs 11/18/2022 FINDINGS: Images were performed intraoperatively  without the presence of a radiologist. The more superior femoral head screw of the proximal femoral cephalomedullary hardware protrudes through the superior aspect of the femoral head. The patient is undergoing removal of the proximally femoral hardware. Partial visualization of left hip arthroplasty hardware. Total fluoroscopy images: 4 Total fluoroscopy time: 7 seconds Total dose: Radiation Exposure Index (as provided by the fluoroscopic device): 1.21 mGy air Kerma Please see intraoperative findings for further detail. IMPRESSION: Intraoperative fluoroscopy for right hip hardware removal. Electronically Signed   By: Neita Garnet M.D.   On: 06/14/2023 17:18   DG C-Arm 1-60 Min-No Report Result Date: 06/14/2023 Fluoroscopy was utilized by the requesting physician.  No radiographic interpretation.   DG C-Arm 1-60 Min-No Report Result Date: 06/14/2023 Fluoroscopy was utilized by the requesting physician.  No radiographic interpretation.    Disposition: Discharge disposition: 01-Home or Self Care       Discharge Instructions     Call MD / Call 911   Complete by: As directed    If you experience chest pain or shortness of breath, CALL 911 and be transported to the hospital emergency room.  If you develope a fever above 101 F, pus (white drainage) or increased drainage or redness at the wound, or calf pain, call your surgeon's office.   Change dressing   Complete by: As directed    Do not change your dressing.   Constipation Prevention   Complete by: As directed    Drink plenty of fluids.  Prune juice may be helpful.  You may use a stool softener, such as Colace (over the counter) 100 mg twice a day.  Use MiraLax (over the counter) for constipation as needed.   Diet - low sodium heart healthy   Complete by: As directed    Diet Carb Modified   Complete by: As directed    Discharge instructions   Complete by: As directed    Elevate toes above nose. Use cryotherapy as needed for pain and  swelling.   Driving restrictions   Complete by: As directed    No driving for 6 weeks   Increase activity slowly as tolerated   Complete by: As directed    Lifting restrictions   Complete by: As directed    No lifting for 6 weeks   Partial weight bearing   Complete by: As directed    % Body Weight: 50   Laterality: right   Extremity: Lower   Post-operative opioid taper instructions:   Complete by: As directed    POST-OPERATIVE OPIOID TAPER INSTRUCTIONS: It is important to wean off  of your opioid medication as soon as possible. If you do not need pain medication after your surgery it is ok to stop day one. Opioids include: Codeine, Hydrocodone(Norco, Vicodin), Oxycodone(Percocet, oxycontin) and hydromorphone amongst others.  Long term and even short term use of opiods can cause: Increased pain response Dependence Constipation Depression Respiratory depression And more.  Withdrawal symptoms can include Flu like symptoms Nausea, vomiting And more Techniques to manage these symptoms Hydrate well Eat regular healthy meals Stay active Use relaxation techniques(deep breathing, meditating, yoga) Do Not substitute Alcohol to help with tapering If you have been on opioids for less than two weeks and do not have pain than it is ok to stop all together.  Plan to wean off of opioids This plan should start within one week post op of your joint replacement. Maintain the same interval or time between taking each dose and first decrease the dose.  Cut the total daily intake of opioids by one tablet each day Next start to increase the time between doses. The last dose that should be eliminated is the evening dose.      TED hose   Complete by: As directed    Use stockings (TED hose) for 2 weeks on both leg(s).  You may remove them at night for sleeping.        Follow-up Information     Clois Dupes, [place]. Schedule an appointment as soon as possible for a visit in 2 week(s).    Specialty: Orthopedic Surgery Why: For suture removal, For wound re-check Contact information: [address], [address] [place] [address] [phone]                  Signed: Clois Dupes 06/16/2023, 3:58 PM

## 2023-07-04 DIAGNOSIS — S72141D Displaced intertrochanteric fracture of right femur, subsequent encounter for closed fracture with routine healing: Secondary | ICD-10-CM | POA: Diagnosis not present

## 2023-07-04 DIAGNOSIS — Z5189 Encounter for other specified aftercare: Secondary | ICD-10-CM | POA: Diagnosis not present

## 2023-07-04 DIAGNOSIS — M87051 Idiopathic aseptic necrosis of right femur: Secondary | ICD-10-CM | POA: Diagnosis not present

## 2023-07-11 ENCOUNTER — Ambulatory Visit: Payer: Self-pay | Admitting: Student

## 2023-07-17 NOTE — Patient Instructions (Addendum)
SURGICAL WAITING ROOM VISITATION Patients having surgery or a procedure may have no more than 2 support people in the waiting area - these visitors may rotate.    Children under the age of 7 must have an adult with them who is not the patient.  Due to an increase in RSV and influenza rates and associated hospitalizations, children ages 37 and under may not visit patients in [facility] hospitals.   If the patient needs to stay at the hospital during part of their recovery, the visitor guidelines for inpatient rooms apply. Pre-op nurse will coordinate an appropriate time for 1 support person to accompany patient in pre-op.  This support person may not rotate.    Please refer to the [facility] website for the visitor guidelines for Inpatients (after your surgery is over and you are in a regular room).       Your procedure is scheduled on: 07-26-23   Report to [facility] Main Entrance    Report to admitting at 7:00 AM   Call this number if you have problems the morning of surgery [phone]   Do not eat food :After Midnight.   After Midnight you may have the following liquids until 6:20 AM DAY OF SURGERY  Water Non-Citrus Juices (without pulp, NO RED-Apple, White grape, White cranberry) Black Coffee (NO MILK/CREAM OR CREAMERS, sugar ok)  Clear Tea (NO MILK/CREAM OR CREAMERS, sugar ok) regular and decaf                             Plain Jell-O (NO RED)                                           Fruit ices (not with fruit pulp, NO RED)                                     Popsicles (NO RED)                                                               Sports drinks like Gatorade (NO RED)                   The day of surgery:  Drink ONE (1) Pre-Surgery G2 by 6:20 AM the morning of surgery. Drink in one sitting. Do not sip.  This drink was given to you during your hospital  pre-op appointment visit. Nothing else to drink after completing the Pre-Surgery G2.          If  you have questions, please contact your surgeon's office.   FOLLOW  ANY ADDITIONAL PRE OP INSTRUCTIONS YOU RECEIVED FROM YOUR SURGEON'S OFFICE!!!     Oral Hygiene is also important to reduce your risk of infection.                                    Remember - BRUSH YOUR TEETH THE MORNING OF SURGERY WITH YOUR REGULAR TOOTHPASTE   Do  NOT smoke after Midnight   Take these medicines the morning of surgery with A SIP OF WATER:   Atorvastatin  Levothyroxine  Okay to use inhalers  Tylenol if needed  Stop all vitamins and herbal supplements 7 days before surgery  How to Manage Your Diabetes Before and After Surgery  Why is it important to control my blood sugar before and after surgery? Improving blood sugar levels before and after surgery helps healing and can limit problems. A way of improving blood sugar control is eating a healthy diet by:  Eating less sugar and carbohydrates  Increasing activity/exercise  Talking with your doctor about reaching your blood sugar goals High blood sugars (greater than 180 mg/dL) can raise your risk of infections and slow your recovery, so you will need to focus on controlling your diabetes during the weeks before surgery. Make sure that the doctor who takes care of your diabetes knows about your planned surgery including the date and location.  How do I manage my blood sugar before surgery? Check your blood sugar at least 4 times a day, starting 2 days before surgery, to make sure that the level is not too high or low. Check your blood sugar the morning of your surgery when you wake up and every 2 hours until you get to the Short Stay unit. If your blood sugar is less than 70 mg/dL, you will need to treat for low blood sugar: Do not take insulin. Treat a low blood sugar (less than 70 mg/dL) with  cup of clear juice (cranberry or apple), 4 glucose tablets, OR glucose gel. Recheck blood sugar in 15 minutes after treatment (to make sure it is greater  than 70 mg/dL). If your blood sugar is not greater than 70 mg/dL on recheck, call [phone] for further instructions. Report your blood sugar to the short stay nurse when you get to Short Stay.  If you are admitted to the hospital after surgery: Your blood sugar will be checked by the staff and you will probably be given insulin after surgery (instead of oral diabetes medicines) to make sure you have good blood sugar levels. The goal for blood sugar control after surgery is 80-180 mg/dL.   WHAT DO I DO ABOUT MY DIABETES MEDICATION?  THE NIGHT BEFORE SURGERY, Insulin Glargine.      THE MORNING OF SURGERY, If your CBG is greater than 220 mg/dL, you may take  of your sliding scale  (correction) dose of insulin (Novolog)  DO NOT TAKE THE FOLLOWING 7 DAYS PRIOR TO SURGERY: Ozempic, Wegovy, Rybelsus (Semaglutide), Byetta (exenatide), Bydureon (exenatide ER), Victoza, Saxenda (liraglutide), or Trulicity (dulaglutide) Mounjaro (Tirzepatide) Adlyxin (Lixisenatide), Polyethylene Glycol Loxenatide.   Reviewed and Endorsed by [facility] Patient Education Committee, August 2015  Bring CPAP mask and tubing day of surgery.                              You may not have any metal on your body including hair pins, jewelry, and body piercing             Do not wear make-up, lotions, powders, perfumes or deodorant  Do not wear nail polish including gel and S&S, artificial/acrylic nails, or any other type of covering on natural nails including finger and toenails. If you have artificial nails, gel coating, etc. that needs to be removed by a nail salon please have this removed prior to surgery or surgery may need  to be canceled/ delayed if the surgeon/ anesthesia feels like they are unable to be safely monitored.   Do not shave  48 hours prior to surgery.    Do not bring valuables to the hospital. [facility] IS NOT RESPONSIBLE   FOR VALUABLES.   Contacts, dentures or bridgework may not be worn into  surgery.   Bring small overnight bag day of surgery.   DO NOT BRING YOUR HOME MEDICATIONS TO THE HOSPITAL. PHARMACY WILL DISPENSE MEDICATIONS LISTED ON YOUR MEDICATION LIST TO YOU DURING YOUR ADMISSION IN THE HOSPITAL!     Special Instructions: Bring a copy of your healthcare power of attorney and living will documents the day of surgery if you haven't scanned them before.              Please read over the following fact sheets you were given: IF YOU HAVE QUESTIONS ABOUT YOUR PRE-OP INSTRUCTIONS PLEASE CALL [phone]  If you received a COVID test during your pre-op visit  it is requested that you wear a mask when out in public, stay away from anyone that may not be feeling well and notify your surgeon if you develop symptoms. If you test positive for Covid or have been in contact with anyone that has tested positive in the last 10 days please notify you surgeon.    Pre-operative 5 CHG Bath Instructions   You can play a key role in reducing the risk of infection after surgery. Your skin needs to be as free of germs as possible. You can reduce the number of germs on your skin by washing with CHG (chlorhexidine gluconate) soap before surgery. CHG is an antiseptic soap that kills germs and continues to kill germs even after washing.   DO NOT use if you have an allergy to chlorhexidine/CHG or antibacterial soaps. If your skin becomes reddened or irritated, stop using the CHG and notify one of our RNs at [phone].   Please shower with the CHG soap starting 4 days before surgery using the following schedule:     Please keep in mind the following:  DO NOT shave, including legs and underarms, starting the day of your first shower.   You may shave your face at any point before/day of surgery.  Place clean sheets on your bed the day you start using CHG soap. Use a clean washcloth (not used since being washed) for each shower. DO NOT sleep with pets once you start using the CHG.   CHG  Shower Instructions:  If you choose to wash your hair and private area, wash first with your normal shampoo/soap.  After you use shampoo/soap, rinse your hair and body thoroughly to remove shampoo/soap residue.  Turn the water OFF and apply about 3 tablespoons (45 ml) of CHG soap to a CLEAN washcloth.  Apply CHG soap ONLY FROM YOUR NECK DOWN TO YOUR TOES (washing for 3-5 minutes)  DO NOT use CHG soap on face, private areas, open wounds, or sores.  Pay special attention to the area where your surgery is being performed.  If you are having back surgery, having someone wash your back for you may be helpful. Wait 2 minutes after CHG soap is applied, then you may rinse off the CHG soap.  Pat dry with a clean towel  Put on clean clothes/pajamas   If you choose to wear lotion, please use ONLY the CHG-compatible lotions on the back of this paper.     Additional instructions for the day of surgery:  DO NOT APPLY any lotions, deodorants, cologne, or perfumes.   Put on clean/comfortable clothes.  Brush your teeth.  Ask your nurse before applying any prescription medications to the skin.      CHG Compatible Lotions   Aveeno Moisturizing lotion  Cetaphil Moisturizing Cream  Cetaphil Moisturizing Lotion  Clairol Herbal Essence Moisturizing Lotion, Dry Skin  Clairol Herbal Essence Moisturizing Lotion, Extra Dry Skin  Clairol Herbal Essence Moisturizing Lotion, Normal Skin  Curel Age Defying Therapeutic Moisturizing Lotion with Alpha Hydroxy  Curel Extreme Care Body Lotion  Curel Soothing Hands Moisturizing Hand Lotion  Curel Therapeutic Moisturizing Cream, Fragrance-Free  Curel Therapeutic Moisturizing Lotion, Fragrance-Free  Curel Therapeutic Moisturizing Lotion, Original Formula  Eucerin Daily Replenishing Lotion  Eucerin Dry Skin Therapy Plus Alpha Hydroxy Crme  Eucerin Dry Skin Therapy Plus Alpha Hydroxy Lotion  Eucerin Original Crme  Eucerin Original Lotion  Eucerin Plus Crme  Eucerin Plus Lotion  Eucerin TriLipid Replenishing Lotion  Keri Anti-Bacterial Hand Lotion  Keri Deep Conditioning Original Lotion Dry Skin Formula Softly Scented  Keri Deep Conditioning Original Lotion, Fragrance Free Sensitive Skin Formula  Keri Lotion Fast Absorbing Fragrance Free Sensitive Skin Formula  Keri Lotion Fast Absorbing Softly Scented Dry Skin Formula  Keri Original Lotion  Keri Skin Renewal Lotion Keri Silky Smooth Lotion  Keri Silky Smooth Sensitive Skin Lotion  Nivea Body Creamy Conditioning Oil  Nivea Body Extra Enriched Lotion  Nivea Body Original Lotion  Nivea Body Sheer Moisturizing Lotion Nivea Crme  Nivea Skin Firming Lotion  NutraDerm 30 Skin Lotion  NutraDerm Skin Lotion  NutraDerm Therapeutic Skin Cream  NutraDerm Therapeutic Skin Lotion  ProShield Protective Hand Cream  Provon moisturizing lotion   PATIENT SIGNATURE_________________________________  NURSE SIGNATURE__________________________________  ________________________________________________________________________    Ariel Cobb  An incentive spirometer is a tool that can help keep your lungs clear and active. This tool measures how well you are filling your lungs with each breath. Taking long deep breaths may help reverse or decrease the chance of developing breathing (pulmonary) problems (especially infection) following: A long period of time when you are unable to move or be active. BEFORE THE PROCEDURE  If the spirometer includes an indicator to show your best effort, your nurse or respiratory therapist will set it to a desired goal. If possible, sit up straight or lean slightly forward. Try not to slouch. Hold the incentive spirometer in an upright position. INSTRUCTIONS FOR USE  Sit on the edge of your bed if possible, or sit up as far as you can in bed or on a chair. Hold the incentive spirometer in an upright position. Breathe out normally. Place the mouthpiece in your mouth  and seal your lips tightly around it. Breathe in slowly and as deeply as possible, raising the piston or the ball toward the top of the column. Hold your breath for 3-5 seconds or for as long as possible. Allow the piston or ball to fall to the bottom of the column. Remove the mouthpiece from your mouth and breathe out normally. Rest for a few seconds and repeat Steps 1 through 7 at least 10 times every 1-2 hours when you are awake. Take your time and take a few normal breaths between deep breaths. The spirometer may include an indicator to show your best effort. Use the indicator as a goal to work toward during each repetition. After each set of 10 deep breaths, practice coughing to be sure your lungs are clear. If you have an incision (the cut made  at the time of surgery), support your incision when coughing by placing a pillow or rolled up towels firmly against it. Once you are able to get out of bed, walk around indoors and cough well. You may stop using the incentive spirometer when instructed by your caregiver.  RISKS AND COMPLICATIONS Take your time so you do not get dizzy or light-headed. If you are in pain, you may need to take or ask for pain medication before doing incentive spirometry. It is harder to take a deep breath if you are having pain. AFTER USE Rest and breathe slowly and easily. It can be helpful to keep track of a log of your progress. Your caregiver can provide you with a simple table to help with this. If you are using the spirometer at home, follow these instructions: SEEK MEDICAL CARE IF:  You are having difficultly using the spirometer. You have trouble using the spirometer as often as instructed. Your pain medication is not giving enough relief while using the spirometer. You develop fever of 100.5 F (38.1 C) or higher. SEEK IMMEDIATE MEDICAL CARE IF:  You cough up bloody sputum that had not been present before. You develop fever of 102 F (38.9 C) or  greater. You develop worsening pain at or near the incision site. MAKE SURE YOU:  Understand these instructions. Will watch your condition. Will get help right away if you are not doing well or get worse. Document Released: 10/24/2006 Document Revised: 09/05/2011 Document Reviewed: 12/25/2006 Exit[employer] Patient Information 2014 [employer], [place].   ________________________________________________________________________ WHAT IS A BLOOD TRANSFUSION? Blood Transfusion Information  A transfusion is the replacement of blood or some of its parts. Blood is made up of multiple cells which provide different functions. Red blood cells carry oxygen and are used for blood loss replacement. White blood cells fight against infection. Platelets control bleeding. Plasma helps clot blood. Other blood products are available for specialized needs, such as hemophilia or other clotting disorders. BEFORE THE TRANSFUSION  Who gives blood for transfusions?  Healthy volunteers who are fully evaluated to make sure their blood is safe. This is blood bank blood. Transfusion therapy is the safest it has ever been in the practice of medicine. Before blood is taken from a donor, a complete history is taken to make sure that person has no history of diseases nor engages in risky social behavior (examples are intravenous drug use or sexual activity with multiple partners). The donor's travel history is screened to minimize risk of transmitting infections, such as malaria. The donated blood is tested for signs of infectious diseases, such as HIV and hepatitis. The blood is then tested to be sure it is compatible with you in order to minimize the chance of a transfusion reaction. If you or a relative donates blood, this is often done in anticipation of surgery and is not appropriate for emergency situations. It takes many days to process the donated blood. RISKS AND COMPLICATIONS Although transfusion therapy is very safe and  saves many lives, the main dangers of transfusion include:  Getting an infectious disease. Developing a transfusion reaction. This is an allergic reaction to something in the blood you were given. Every precaution is taken to prevent this. The decision to have a blood transfusion has been considered carefully by your caregiver before blood is given. Blood is not given unless the benefits outweigh the risks. AFTER THE TRANSFUSION Right after receiving a blood transfusion, you will usually feel much better and more energetic. This is especially  true if your red blood cells have gotten low (anemic). The transfusion raises the level of the red blood cells which carry oxygen, and this usually causes an energy increase. The nurse administering the transfusion will monitor you carefully for complications. HOME CARE INSTRUCTIONS  No special instructions are needed after a transfusion. You may find your energy is better. Speak with your caregiver about any limitations on activity for underlying diseases you may have. SEEK MEDICAL CARE IF:  Your condition is not improving after your transfusion. You develop redness or irritation at the intravenous (IV) site. SEEK IMMEDIATE MEDICAL CARE IF:  Any of the following symptoms occur over the next 12 hours: Shaking chills. You have a temperature by mouth above 102 F (38.9 C), not controlled by medicine. Chest, back, or muscle pain. People around you feel you are not acting correctly or are confused. Shortness of breath or difficulty breathing. Dizziness and fainting. You get a rash or develop hives. You have a decrease in urine output. Your urine turns a dark color or changes to pink, red, or brown. Any of the following symptoms occur over the next 10 days: You have a temperature by mouth above 102 F (38.9 C), not controlled by medicine. Shortness of breath. Weakness after normal activity. The white part of the eye turns yellow (jaundice). You have a  decrease in the amount of urine or are urinating less often. Your urine turns a dark color or changes to pink, red, or brown. Document Released: 06/10/2000 Document Revised: 09/05/2011 Document Reviewed: 01/28/2008 [facility] Patient Information 2014 [place], [place].  _______________________________________________________________________

## 2023-07-18 ENCOUNTER — Encounter (HOSPITAL_COMMUNITY): Payer: Self-pay

## 2023-07-18 ENCOUNTER — Encounter (HOSPITAL_COMMUNITY)
Admission: RE | Admit: 2023-07-18 | Discharge: 2023-07-18 | Disposition: A | Discharge status: Home / Self Care | Payer: Medicare HMO | Source: Ambulatory Visit | Attending: Orthopedic Surgery | Admitting: Orthopedic Surgery

## 2023-07-18 ENCOUNTER — Other Ambulatory Visit: Payer: Self-pay

## 2023-07-18 VITALS — BP 132/55 | HR 63 | Temp 97.8°F | Resp 16 | Ht 64.0 in | Wt 110.0 lb

## 2023-07-18 DIAGNOSIS — E119 Type 2 diabetes mellitus without complications: Secondary | ICD-10-CM | POA: Diagnosis not present

## 2023-07-18 DIAGNOSIS — D649 Anemia, unspecified: Secondary | ICD-10-CM | POA: Insufficient documentation

## 2023-07-18 DIAGNOSIS — Z01812 Encounter for preprocedural laboratory examination: Secondary | ICD-10-CM | POA: Diagnosis present

## 2023-07-18 DIAGNOSIS — Z794 Long term (current) use of insulin: Secondary | ICD-10-CM | POA: Diagnosis not present

## 2023-07-18 DIAGNOSIS — Z01818 Encounter for other preprocedural examination: Secondary | ICD-10-CM

## 2023-07-18 DIAGNOSIS — I10 Essential (primary) hypertension: Secondary | ICD-10-CM | POA: Diagnosis not present

## 2023-07-18 LAB — SURGICAL PCR SCREEN
MRSA, PCR: NEGATIVE
Staphylococcus aureus: POSITIVE — AB

## 2023-07-18 LAB — BASIC METABOLIC PANEL
Anion gap: 11 (ref 5–15)
BUN: 44 mg/dL — ABNORMAL HIGH (ref 8–23)
CO2: 25 mmol/L (ref 22–32)
Calcium: 9.1 mg/dL (ref 8.9–10.3)
Chloride: 102 mmol/L (ref 98–111)
Creatinine, Ser: 1.41 mg/dL — ABNORMAL HIGH (ref 0.44–1.00)
GFR, Estimated: 38 mL/min — ABNORMAL LOW (ref >60)
Glucose, Bld: 136 mg/dL — ABNORMAL HIGH (ref 70–99)
Potassium: 3.8 mmol/L (ref 3.5–5.1)
Sodium: 138 mmol/L (ref 135–145)

## 2023-07-18 LAB — CBC
HCT: 33.6 % — ABNORMAL LOW (ref 36.0–46.0)
Hemoglobin: 10.7 g/dL — ABNORMAL LOW (ref 12.0–15.0)
MCH: 32.1 pg (ref 26.0–34.0)
MCHC: 31.8 g/dL (ref 30.0–36.0)
MCV: 100.9 fL — ABNORMAL HIGH (ref 80.0–100.0)
Platelets: 351 10*3/uL (ref 150–400)
RBC: 3.33 MIL/uL — ABNORMAL LOW (ref 3.87–5.11)
RDW: 15.2 % (ref 11.5–15.5)
WBC: 8.6 10*3/uL (ref 4.0–10.5)
nRBC: 0 % (ref 0.0–0.2)

## 2023-07-18 LAB — GLUCOSE, CAPILLARY: Glucose-Capillary: 119 mg/dL — ABNORMAL HIGH (ref 70–99)

## 2023-07-18 NOTE — Progress Notes (Addendum)
COVID Vaccine received:  []  No [x]  Yes Date of any COVID positive Test in last 90 days: no PCP - Renford Dills Cardiologist - Nanetta Batty  Chest x-ray -  EKG -  04/05/23 Epic Stress Test - 04/18/23 [facility] ECHO - 05/03/23 [facility] Cardiac Cath -   Bowel Prep - [x]  No  []   Yes ______  Pacemaker / ICD device [x]  No []  Yes   Spinal Cord Stimulator:[x]  No []  Yes       History of Sleep Apnea? [x]  No []  Yes   CPAP used?- [x]  No []  Yes    Does the patient monitor blood sugar?          []  No [x]  Yes  []  N/A  Patient has: []  NO Hx DM   []  Pre-DM                 []  DM1  [x]   DM2 Does patient have a [employer] or Dexacom? []  No []  Yes   Fasting Blood Sugar Ranges- 85-100 Checks Blood Sugar ___3__ times a day  GLP1 agonist / usual dose - no GLP1 instructions:  SGLT-2 inhibitors / usual dose - no SGLT-2 instructions:   Blood Thinner / Instructions:Eliquis- Last dose 07/22/23 Aspirin Instructions:no  Comments:   Activity level: Patient is able  to climb a flight of stairs without difficulty; [x]  No CP  [x]  No SOB, but would have __Hip dysfunction_   Patient  can not perform ADLs without assistance.   Anesthesia review: DM, HTN, A-fib, CKD, COPD  Patient denies shortness of breath, fever, cough and chest pain at PAT appointment.  Patient verbalized understanding and agreement to the Pre-Surgical Instructions that were given to them at this PAT appointment. Patient was also educated of the need to review these PAT instructions again prior to his/her surgery.I reviewed the appropriate phone numbers to call if they have any and questions or concerns.

## 2023-07-20 DIAGNOSIS — E039 Hypothyroidism, unspecified: Secondary | ICD-10-CM | POA: Diagnosis not present

## 2023-07-20 NOTE — Anesthesia Preprocedure Evaluation (Signed)
Anesthesia Evaluation    Airway        Dental   Pulmonary Current Smoker          Cardiovascular hypertension,      Neuro/Psych    GI/Hepatic   Endo/Other  diabetes    Renal/GU      Musculoskeletal   Abdominal   Peds  Hematology   Anesthesia Other Findings   Reproductive/Obstetrics                              Anesthesia Physical Anesthesia Plan  ASA:   Anesthesia Plan:    Post-op Pain Management:    Induction:   PONV Risk Score and Plan:   Airway Management Planned:   Additional Equipment:   Intra-op Plan:   Post-operative Plan:   Informed Consent:   Plan Discussed with:   Anesthesia Plan Comments: (Reviewed 12/11 prior to surgery. No complications. No updates to medical hx)         Anesthesia Quick Evaluation

## 2023-07-20 NOTE — Progress Notes (Signed)
Please review PCR results from 07/18/23.

## 2023-07-24 DIAGNOSIS — M87051 Idiopathic aseptic necrosis of right femur: Secondary | ICD-10-CM | POA: Diagnosis not present

## 2023-07-24 DIAGNOSIS — S72141D Displaced intertrochanteric fracture of right femur, subsequent encounter for closed fracture with routine healing: Secondary | ICD-10-CM | POA: Diagnosis not present

## 2023-07-26 ENCOUNTER — Encounter (HOSPITAL_COMMUNITY): Payer: Self-pay | Admitting: Medical

## 2023-07-26 ENCOUNTER — Encounter (HOSPITAL_COMMUNITY): Admission: RE | Payer: Self-pay | Source: Ambulatory Visit

## 2023-07-26 ENCOUNTER — Ambulatory Visit (HOSPITAL_COMMUNITY): Admission: RE | Admit: 2023-07-26 | Payer: Medicare HMO | Source: Ambulatory Visit | Admitting: Orthopedic Surgery

## 2023-07-26 LAB — TYPE AND SCREEN
ABO/RH(D): O NEG
Antibody Screen: NEGATIVE

## 2023-07-26 SURGERY — TOTAL HIP ARTHROPLASTY ANTERIOR APPROACH
Anesthesia: Spinal | Site: Hip | Laterality: Right

## 2023-08-21 DIAGNOSIS — M87051 Idiopathic aseptic necrosis of right femur: Secondary | ICD-10-CM | POA: Diagnosis not present

## 2023-08-21 DIAGNOSIS — M1611 Unilateral primary osteoarthritis, right hip: Secondary | ICD-10-CM | POA: Diagnosis not present

## 2023-08-24 ENCOUNTER — Ambulatory Visit: Payer: Self-pay | Admitting: Student

## 2023-08-24 DIAGNOSIS — Z794 Long term (current) use of insulin: Secondary | ICD-10-CM

## 2023-08-25 ENCOUNTER — Ambulatory Visit: Payer: Self-pay | Admitting: Student

## 2023-08-25 NOTE — H&P (Addendum)
 TOTAL HIP ADMISSION H&P  Patient is admitted for right total hip arthroplasty.  Subjective:  Chief Complaint: right hip pain  HPI: Ariel Cobb, 78 y.o. female, has a history of pain and functional disability in the right hip(s) due to trauma, arthritis, and avascular necrosis  and patient has failed non-surgical conservative treatments for greater than 12 weeks to include NSAID's and/or analgesics, flexibility and strengthening excercises, use of assistive devices, and activity modification.  Onset of symptoms was gradual starting >10 years ago with rapidlly worsening course since that time.The patient noted prior procedures of the hip to include right IM fixation, and hardware removal  on the right hip(s).  Patient currently rates pain in the right hip at 10 out of 10 with activity. Patient has night pain, worsening of pain with activity and weight bearing, trendelenberg gait, pain that interfers with activities of daily living, pain with passive range of motion, and severe motion restriction . Patient has evidence of subchondral cysts, subchondral sclerosis, periarticular osteophytes, joint space narrowing, and avascular necrosis  by imaging studies. This condition presents safety issues increasing the risk of falls. This patient has had proximal femur fracture and avascular necrosis right hip .  There is no current active infection.  Patient Active Problem List   Diagnosis Date Noted   Avascular necrosis of bone of right hip (HCC) 06/14/2023   Nicotine dependence, cigarettes, uncomplicated 08/06/2021   Closed right hip fracture, initial encounter (HCC) 08/05/2021   Anticoagulated 01/28/2019   COPD (chronic obstructive pulmonary disease) (HCC) 01/28/2019   Paroxysmal atrial fibrillation (HCC) 01/10/2019   Hypothyroidism    Type II diabetes mellitus (HCC)    Chronic kidney disease, stage 3b (HCC)    PAD (peripheral artery disease) (HCC)    Essential hypertension 05/06/2013   Mixed  hyperlipidemia due to type 2 diabetes mellitus (HCC) 05/06/2013   Type 2 diabetes mellitus with stage 3b chronic kidney disease, with long-term current use of insulin (HCC) 05/06/2013   Tobacco abuse 05/06/2013   Claudication (HCC) 05/06/2013   Past Medical History:  Diagnosis Date   A-fib (HCC)    Anemia    Arthritis    Chronic bronchitis (HCC)    Chronic kidney disease    sees dr sanford yearly stage 3 stable   Claudication (HCC)    Complication of anesthesia 2000   slow to awaken after hip surgery   COPD (chronic obstructive pulmonary disease) (HCC)    Depression    Diabetes mellitus without complication (HCC)    type 1   Dysrhythmia    Hyperlipidemia    Hypertension    Hypothyroidism    Impetigo    Neuromuscular disorder (HCC)    peripheral neuropathy   Osteoporosis    PAD (peripheral artery disease) (HCC)    a. 07/29/13: s/p PV angiogram with successful diamondback orbital rotational atherectomy of the high-grade calcified R popliteal artery stenosis   Shortness of breath    on exertion   Thyroid disease     Past Surgical History:  Procedure Laterality Date   ABDOMINAL AORTAGRAM  06/04/2013   Procedure: ABDOMINAL AORTAGRAM;  Surgeon: Runell Gess, MD;  Location: [facility] CATH LAB;  Service: Cardiovascular;;   ATHERECTOMY Right 07/29/2013   popliteal/notes 07/29/2013   CATARACT EXTRACTION, BILATERAL Bilateral    CHOLECYSTECTOMY  1980's?   COLONOSCOPY WITH PROPOFOL N/A 09/06/2016   Procedure: COLONOSCOPY WITH PROPOFOL;  Surgeon: Charolett Bumpers, MD;  Location: [facility];  Service: Endoscopy;  Laterality: N/A;   cyst  from right hand     ESOPHAGOGASTRODUODENOSCOPY (EGD) WITH PROPOFOL N/A 09/06/2016   Procedure: ESOPHAGOGASTRODUODENOSCOPY (EGD) WITH PROPOFOL;  Surgeon: Charolett Bumpers, MD;  Location: [facility];  Service: Endoscopy;  Laterality: N/A;   HARDWARE REMOVAL Right 06/14/2023   Procedure: HARDWARE REMOVAL;  Surgeon: Samson Frederic, MD;  Location: [facility]S;   Service: Orthopedics;  Laterality: Right;   HIP FRACTURE SURGERY Left 2000's   "I broke the ball; dr broke femur during OR; got a rod in there" 92/07/2013)   INTRAMEDULLARY (IM) NAIL INTERTROCHANTERIC Right 08/06/2021   Procedure: INTRAMEDULLARY (IM) NAIL INTERTROCHANTRIC;  Surgeon: Roby Lofts, MD;  Location: [facility] OR;  Service: Orthopedics;  Laterality: Right;   LOWER EXTREMITY ANGIOGRAM N/A 06/04/2013   Procedure: LOWER EXTREMITY ANGIOGRAM;  Surgeon: Runell Gess, MD;  Location: [facility] CATH LAB;  Service: Cardiovascular;  Laterality: N/A;    Current Outpatient Medications  Medication Sig Dispense Refill Last Dose/Taking   acetaminophen (TYLENOL) 650 MG CR tablet Take 650 mg by mouth at bedtime as needed for pain.      albuterol (VENTOLIN HFA) 108 (90 Base) MCG/ACT inhaler Inhale 2 puffs into the lungs every 6 (six) hours as needed for wheezing or shortness of breath.      ascorbic acid (VITAMIN C) 500 MG tablet Take 500 mg by mouth daily with lunch.      atorvastatin (LIPITOR) 80 MG tablet TAKE 1 TABLET BY MOUTH EVERY MORNING. 90 tablet 3    budesonide-formoterol (SYMBICORT) 160-4.5 MCG/ACT inhaler Inhale 2 puffs into the lungs daily as needed (wheezing/respiratory issues.).      clobetasol (TEMOVATE) 0.05 % external solution Apply 1 Application topically 2 (two) times daily as needed (psoriasis).      ELIQUIS 2.5 MG TABS tablet TAKE 1 TABLET BY MOUTH TWICE A DAY 180 tablet 1    glycerin, Pediatric, 1.2 g SUPP Place 1 suppository rectally daily as needed for moderate constipation.      hydrochlorothiazide (HYDRODIURIL) 25 MG tablet Take 25 mg by mouth in the morning.      Insulin Glargine (BASAGLAR KWIKPEN) 100 UNIT/ML Inject 8 Units into the skin daily in the afternoon.      levothyroxine (SYNTHROID) 88 MCG tablet Take 88 mcg by mouth daily before breakfast.      NOVOLOG FLEXPEN 100 UNIT/ML FlexPen Inject 5-7 Units into the skin See admin instructions. Inject 5 units under the skin in the  morning, 6 units ,at lunch and 7 units with dinner  11    ONE TOUCH ULTRA TEST test strip   4    ramipril (ALTACE) 2.5 MG capsule Take 1 capsule (2.5 mg total) by mouth daily. 90 capsule 3    sodium chloride (OCEAN) 0.65 % SOLN nasal spray Place 1 spray into both nostrils as needed for congestion.      Zinc 50 MG TABS Take 50 mg by mouth daily with lunch.      No current facility-administered medications for this visit.   Allergies  Allergen Reactions   Adhesive [Tape] Itching    Surgical Tape - "makes me itch and break me out" -- HAS TO USE PAPER TAPE   Amoxicillin Hives, Itching and Swelling   Fentanyl Other (See Comments)   Latex Itching   Other Other (See Comments)   Pioglitazone Other (See Comments)    Social History   Tobacco Use   Smoking status: Every Day    Current packs/day: 1.00    Average packs/day: 1 pack/day for 50.0 years (  50.0 ttl pk-yrs)    Types: Cigarettes   Smokeless tobacco: Never  Substance Use Topics   Alcohol use: No    Family History  Problem Relation Age of Onset   Heart disease Father      Review of Systems  Musculoskeletal:  Positive for arthralgias and gait problem.  All other systems reviewed and are negative.   Objective:  Physical Exam Constitutional:      Appearance: Normal appearance.  HENT:     Head: Normocephalic and atraumatic.     Nose: Nose normal.     Mouth/Throat:     Mouth: Mucous membranes are moist.     Pharynx: Oropharynx is clear.  Eyes:     Conjunctiva/sclera: Conjunctivae normal.  Cardiovascular:     Rate and Rhythm: Normal rate and regular rhythm.     Pulses: Normal pulses.     Heart sounds: Normal heart sounds.  Pulmonary:     Effort: Pulmonary effort is normal.     Breath sounds: Normal breath sounds.  Abdominal:     General: Abdomen is flat.     Palpations: Abdomen is soft.  Genitourinary:    Comments: Deferred.  Musculoskeletal:     Cervical back: Normal range of motion and neck supple.      Comments: Examination of the right hip reveals a well healed incisions x3. No drainage or erythema. No wound dehiscence. No subcutaneous fluid collection. Pain with hip movement with severe stiffness. Trochanteric tenderness to palpation.  Distally, she has 1+ pedal pulses. She reports intact sensation. No focal motor deficit.  Mild bilateral LE pitting edema much better controlled. Her skin tears have healed. No drainage or erythema.   She ambulates with a severely antalgic gait using a standard rolling walker.  Skin:    General: Skin is warm and dry.     Capillary Refill: Capillary refill takes less than 2 seconds.  Neurological:     General: No focal deficit present.     Mental Status: She is alert and oriented to person, place, and time.  Psychiatric:        Mood and Affect: Mood normal.        Behavior: Behavior normal.        Thought Content: Thought content normal.        Judgment: Judgment normal.     Vital signs in last 24 hours: @VSRANGES @  Labs:   Estimated body mass index is 18.88 kg/m as calculated from the following:   Height as of 07/18/23: 5\' 4"  (1.626 m).   Weight as of 07/18/23: 49.9 kg.   Imaging Review Plain radiographs demonstrate severe degenerative joint disease of the right hip(s). The bone quality appears to be adequate for age and reported activity level.      Assessment/Plan:  End stage arthritis, right hip(s)  The patient history, physical examination, clinical judgement of the provider and imaging studies are consistent with end stage degenerative joint disease of the right hip(s) and total hip arthroplasty is deemed medically necessary. The treatment options including medical management, injection therapy, arthroscopy and arthroplasty were discussed at length. The risks and benefits of total hip arthroplasty were presented and reviewed. The risks due to aseptic loosening, infection, stiffness, dislocation/subluxation,  thromboembolic  complications and other imponderables were discussed.  The patient acknowledged the explanation, agreed to proceed with the plan and consent was signed. Patient is being admitted for inpatient treatment for surgery, pain control, PT, OT, prophylactic antibiotics, VTE prophylaxis, progressive ambulation and ADL's  and discharge planning.The patient is planning to be discharged home with HEP after an overnight stay.   Therapy Plans: HEP. Disposition: Home with Steward Drone the sister Planned DVT Prophylaxis: Eliquis 2.5 BID DME needed: Has rolling walker.  PCP: Cleared.  Cardiology: Cleared.  TXA: IV Allergies:  - Adhesive tape - breakout and itching.  - Latex - redness, itching.  Anesthesia Concerns: None. Doesn't want fentanyl.  BMI: 18.0 Last HgbA1c: 6.5 Other: - Issues with orthostatic hypotension, cardiology cleared. Likely new baseline.  - CAD.  - Afib, Eliquis at baseline, stop 3 days prior to surgery.  - PAD - COPD - Staples** - Osteoporosis, not taking alendronate anymore.  - T1DM, insulin.  - Hydrocodone, zofran. - NO NSAIDs.  - Labs pending.    Patient's anticipated LOS is less than 2 midnights, meeting these requirements: - Younger than 85 - Lives within 1 hour of care - Has a competent adult at home to recover with post-op recover - NO history of  - Chronic pain requiring opiods  - Diabetes  - Coronary Artery Disease  - Heart failure  - Heart attack  - Stroke  - DVT/VTE  - Cardiac arrhythmia  - Respiratory Failure/COPD  - Renal failure  - Anemia  - Advanced Liver disease

## 2023-08-25 NOTE — H&P (View-Only) (Signed)
 TOTAL HIP ADMISSION H&P  Patient is admitted for right total hip arthroplasty.  Subjective:  Chief Complaint: right hip pain  HPI: Ariel Cobb, 78 y.o. female, has a history of pain and functional disability in the right hip(s) due to trauma, arthritis, and avascular necrosis  and patient has failed non-surgical conservative treatments for greater than 12 weeks to include NSAID's and/or analgesics, flexibility and strengthening excercises, use of assistive devices, and activity modification.  Onset of symptoms was gradual starting >10 years ago with rapidlly worsening course since that time.The patient noted prior procedures of the hip to include right IM fixation, and hardware removal  on the right hip(s).  Patient currently rates pain in the right hip at 10 out of 10 with activity. Patient has night pain, worsening of pain with activity and weight bearing, trendelenberg gait, pain that interfers with activities of daily living, pain with passive range of motion, and severe motion restriction . Patient has evidence of subchondral cysts, subchondral sclerosis, periarticular osteophytes, joint space narrowing, and avascular necrosis  by imaging studies. This condition presents safety issues increasing the risk of falls. This patient has had proximal femur fracture and avascular necrosis right hip .  There is no current active infection.  Patient Active Problem List   Diagnosis Date Noted   Avascular necrosis of bone of right hip (HCC) 06/14/2023   Nicotine dependence, cigarettes, uncomplicated 08/06/2021   Closed right hip fracture, initial encounter (HCC) 08/05/2021   Anticoagulated 01/28/2019   COPD (chronic obstructive pulmonary disease) (HCC) 01/28/2019   Paroxysmal atrial fibrillation (HCC) 01/10/2019   Hypothyroidism    Type II diabetes mellitus (HCC)    Chronic kidney disease, stage 3b (HCC)    PAD (peripheral artery disease) (HCC)    Essential hypertension 05/06/2013   Mixed  hyperlipidemia due to type 2 diabetes mellitus (HCC) 05/06/2013   Type 2 diabetes mellitus with stage 3b chronic kidney disease, with long-term current use of insulin (HCC) 05/06/2013   Tobacco abuse 05/06/2013   Claudication (HCC) 05/06/2013   Past Medical History:  Diagnosis Date   A-fib (HCC)    Anemia    Arthritis    Chronic bronchitis (HCC)    Chronic kidney disease    sees dr sanford yearly stage 3 stable   Claudication (HCC)    Complication of anesthesia 2000   slow to awaken after hip surgery   COPD (chronic obstructive pulmonary disease) (HCC)    Depression    Diabetes mellitus without complication (HCC)    type 1   Dysrhythmia    Hyperlipidemia    Hypertension    Hypothyroidism    Impetigo    Neuromuscular disorder (HCC)    peripheral neuropathy   Osteoporosis    PAD (peripheral artery disease) (HCC)    a. 07/29/13: s/p PV angiogram with successful diamondback orbital rotational atherectomy of the high-grade calcified R popliteal artery stenosis   Shortness of breath    on exertion   Thyroid disease     Past Surgical History:  Procedure Laterality Date   ABDOMINAL AORTAGRAM  06/04/2013   Procedure: ABDOMINAL AORTAGRAM;  Surgeon: Runell Gess, MD;  Location: [facility] CATH LAB;  Service: Cardiovascular;;   ATHERECTOMY Right 07/29/2013   popliteal/notes 07/29/2013   CATARACT EXTRACTION, BILATERAL Bilateral    CHOLECYSTECTOMY  1980's?   COLONOSCOPY WITH PROPOFOL N/A 09/06/2016   Procedure: COLONOSCOPY WITH PROPOFOL;  Surgeon: Charolett Bumpers, MD;  Location: [facility];  Service: Endoscopy;  Laterality: N/A;   cyst  from right hand     ESOPHAGOGASTRODUODENOSCOPY (EGD) WITH PROPOFOL N/A 09/06/2016   Procedure: ESOPHAGOGASTRODUODENOSCOPY (EGD) WITH PROPOFOL;  Surgeon: Charolett Bumpers, MD;  Location: [facility];  Service: Endoscopy;  Laterality: N/A;   HARDWARE REMOVAL Right 06/14/2023   Procedure: HARDWARE REMOVAL;  Surgeon: Samson Frederic, MD;  Location: [facility];   Service: Orthopedics;  Laterality: Right;   HIP FRACTURE SURGERY Left 2000's   "I broke the ball; dr broke femur during OR; got a rod in there" 92/07/2013)   INTRAMEDULLARY (IM) NAIL INTERTROCHANTERIC Right 08/06/2021   Procedure: INTRAMEDULLARY (IM) NAIL INTERTROCHANTRIC;  Surgeon: Roby Lofts, MD;  Location: [facility] OR;  Service: Orthopedics;  Laterality: Right;   LOWER EXTREMITY ANGIOGRAM N/A 06/04/2013   Procedure: LOWER EXTREMITY ANGIOGRAM;  Surgeon: Runell Gess, MD;  Location: [facility] CATH LAB;  Service: Cardiovascular;  Laterality: N/A;    Current Outpatient Medications  Medication Sig Dispense Refill Last Dose/Taking   acetaminophen (TYLENOL) 650 MG CR tablet Take 650 mg by mouth at bedtime as needed for pain.      albuterol (VENTOLIN HFA) 108 (90 Base) MCG/ACT inhaler Inhale 2 puffs into the lungs every 6 (six) hours as needed for wheezing or shortness of breath.      ascorbic acid (VITAMIN C) 500 MG tablet Take 500 mg by mouth daily with lunch.      atorvastatin (LIPITOR) 80 MG tablet TAKE 1 TABLET BY MOUTH EVERY MORNING. 90 tablet 3    budesonide-formoterol (SYMBICORT) 160-4.5 MCG/ACT inhaler Inhale 2 puffs into the lungs daily as needed (wheezing/respiratory issues.).      clobetasol (TEMOVATE) 0.05 % external solution Apply 1 Application topically 2 (two) times daily as needed (psoriasis).      ELIQUIS 2.5 MG TABS tablet TAKE 1 TABLET BY MOUTH TWICE A DAY 180 tablet 1    glycerin, Pediatric, 1.2 g SUPP Place 1 suppository rectally daily as needed for moderate constipation.      hydrochlorothiazide (HYDRODIURIL) 25 MG tablet Take 25 mg by mouth in the morning.      Insulin Glargine (BASAGLAR KWIKPEN) 100 UNIT/ML Inject 8 Units into the skin daily in the afternoon.      levothyroxine (SYNTHROID) 88 MCG tablet Take 88 mcg by mouth daily before breakfast.      NOVOLOG FLEXPEN 100 UNIT/ML FlexPen Inject 5-7 Units into the skin See admin instructions. Inject 5 units under the skin in the  morning, 6 units ,at lunch and 7 units with dinner  11    ONE TOUCH ULTRA TEST test strip   4    ramipril (ALTACE) 2.5 MG capsule Take 1 capsule (2.5 mg total) by mouth daily. 90 capsule 3    sodium chloride (OCEAN) 0.65 % SOLN nasal spray Place 1 spray into both nostrils as needed for congestion.      Zinc 50 MG TABS Take 50 mg by mouth daily with lunch.      No current facility-administered medications for this visit.   Allergies  Allergen Reactions   Adhesive [Tape] Itching    Surgical Tape - "makes me itch and break me out" -- HAS TO USE PAPER TAPE   Amoxicillin Hives, Itching and Swelling   Fentanyl Other (See Comments)   Latex Itching   Other Other (See Comments)   Pioglitazone Other (See Comments)    Social History   Tobacco Use   Smoking status: Every Day    Current packs/day: 1.00    Average packs/day: 1 pack/day for 50.0 years (  50.0 ttl pk-yrs)    Types: Cigarettes   Smokeless tobacco: Never  Substance Use Topics   Alcohol use: No    Family History  Problem Relation Age of Onset   Heart disease Father      Review of Systems  Musculoskeletal:  Positive for arthralgias and gait problem.  All other systems reviewed and are negative.   Objective:  Physical Exam Constitutional:      Appearance: Normal appearance.  HENT:     Head: Normocephalic and atraumatic.     Nose: Nose normal.     Mouth/Throat:     Mouth: Mucous membranes are moist.     Pharynx: Oropharynx is clear.  Eyes:     Conjunctiva/sclera: Conjunctivae normal.  Cardiovascular:     Rate and Rhythm: Normal rate and regular rhythm.     Pulses: Normal pulses.     Heart sounds: Normal heart sounds.  Pulmonary:     Effort: Pulmonary effort is normal.     Breath sounds: Normal breath sounds.  Abdominal:     General: Abdomen is flat.     Palpations: Abdomen is soft.  Genitourinary:    Comments: Deferred.  Musculoskeletal:     Cervical back: Normal range of motion and neck supple.      Comments: Examination of the right hip reveals a well healed incisions x3. No drainage or erythema. No wound dehiscence. No subcutaneous fluid collection. Pain with hip movement with severe stiffness. Trochanteric tenderness to palpation.  Distally, she has 1+ pedal pulses. She reports intact sensation. No focal motor deficit.  Mild bilateral LE pitting edema much better controlled. Her skin tears have healed. No drainage or erythema.   She ambulates with a severely antalgic gait using a standard rolling walker.  Skin:    General: Skin is warm and dry.     Capillary Refill: Capillary refill takes less than 2 seconds.  Neurological:     General: No focal deficit present.     Mental Status: She is alert and oriented to person, place, and time.  Psychiatric:        Mood and Affect: Mood normal.        Behavior: Behavior normal.        Thought Content: Thought content normal.        Judgment: Judgment normal.     Vital signs in last 24 hours: @VSRANGES @  Labs:   Estimated body mass index is 18.88 kg/m as calculated from the following:   Height as of 07/18/23: 5\' 4"  (1.626 m).   Weight as of 07/18/23: 49.9 kg.   Imaging Review Plain radiographs demonstrate severe degenerative joint disease of the right hip(s). The bone quality appears to be adequate for age and reported activity level.      Assessment/Plan:  End stage arthritis, right hip(s)  The patient history, physical examination, clinical judgement of the provider and imaging studies are consistent with end stage degenerative joint disease of the right hip(s) and total hip arthroplasty is deemed medically necessary. The treatment options including medical management, injection therapy, arthroscopy and arthroplasty were discussed at length. The risks and benefits of total hip arthroplasty were presented and reviewed. The risks due to aseptic loosening, infection, stiffness, dislocation/subluxation,  thromboembolic  complications and other imponderables were discussed.  The patient acknowledged the explanation, agreed to proceed with the plan and consent was signed. Patient is being admitted for inpatient treatment for surgery, pain control, PT, OT, prophylactic antibiotics, VTE prophylaxis, progressive ambulation and ADL's  and discharge planning.The patient is planning to be discharged home with HEP after an overnight stay.   Therapy Plans: HEP. Disposition: Home with Steward Drone the sister Planned DVT Prophylaxis: Eliquis 2.5 BID DME needed: Has rolling walker.  PCP: Cleared.  Cardiology: Cleared.  TXA: IV Allergies:  - Adhesive tape - breakout and itching.  - Latex - redness, itching.  Anesthesia Concerns: None. Doesn't want fentanyl.  BMI: 18.0 Last HgbA1c: 6.5 Other: - Issues with orthostatic hypotension, cardiology cleared. Likely new baseline.  - CAD.  - Afib, Eliquis at baseline, stop 3 days prior to surgery.  - PAD - COPD - Staples** - Osteoporosis, not taking alendronate anymore.  - T2DM, insulin.  - Hydrocodone, zofran. - NO NSAIDs.  - Labs pending.    Patient's anticipated LOS is less than 2 midnights, meeting these requirements: - Younger than 69 - Lives within 1 hour of care - Has a competent adult at home to recover with post-op recover - NO history of  - Chronic pain requiring opiods  - Diabetes  - Coronary Artery Disease  - Heart failure  - Heart attack  - Stroke  - DVT/VTE  - Cardiac arrhythmia  - Respiratory Failure/COPD  - Renal failure  - Anemia  - Advanced Liver disease

## 2023-08-31 NOTE — Patient Instructions (Signed)
 SURGICAL WAITING ROOM VISITATION  Patients having surgery or a procedure may have no more than 2 support people in the waiting area - these visitors may rotate.    Children under the age of 58 must have an adult with them who is not the patient.  Due to an increase in RSV and influenza rates and associated hospitalizations, children ages 87 and under may not visit patients in [facility] hospitals.  Visitors with respiratory illnesses are discouraged from visiting and should remain at home.  If the patient needs to stay at the hospital during part of their recovery, the visitor guidelines for inpatient rooms apply. Pre-op nurse will coordinate an appropriate time for 1 support person to accompany patient in pre-op.  This support person may not rotate.    Please refer to the [facility] website for the visitor guidelines for Inpatients (after your surgery is over and you are in a regular room).       Your procedure is scheduled on: 09/06/23   Report to [facility] Main Entrance    Report to admitting at 12 PM   Call this number if you have problems the morning of surgery [phone]   Do not eat food :After Midnight.   After Midnight you may have the following liquids until 11:30 AM DAY OF SURGERY  Water Non-Citrus Juices (without pulp, NO RED-Apple, White grape, White cranberry) Black Coffee (NO MILK/CREAM OR CREAMERS, sugar ok)  Clear Tea (NO MILK/CREAM OR CREAMERS, sugar ok) regular and decaf                             Plain Jell-O (NO RED)                                           Fruit ices (not with fruit pulp, NO RED)                                     Popsicles (NO RED)                                                               Sports drinks like Gatorade (NO RED)                  The day of surgery:  Drink ONE (1) Pre-Surgery G2 at 11:30 AM the morning of surgery. Drink in one sitting. Do not sip.  This drink was given to you during your hospital   pre-op appointment visit. Nothing else to drink after completing the  Pre-Surgery  G2.       Oral Hygiene is also important to reduce your risk of infection.                                    Remember - BRUSH YOUR TEETH THE MORNING OF SURGERY WITH YOUR REGULAR TOOTHPASTE  DENTURES WILL BE REMOVED PRIOR TO SURGERY PLEASE DO NOT APPLY "Poly grip" OR ADHESIVES!!!   Do  NOT smoke after Midnight   Stop all vitamins and herbal supplements 7 days before surgery.   Take these medicines the morning of surgery with A SIP OF WATER: Atorvastatin, Levothyroxine, Tylenol if needed.              Do not take Altace(Ramipril) or Hydrochlorothiazide the morning of surgery.  DO NOT TAKE ANY ORAL DIABETIC MEDICATIONS DAY OF YOUR SURGERY Take only half of Glargine insulin dose the morning of surgery. If blood sugar >220 can take half of morning dose of Novolog insulin otherwise don't take it.             You may not have any metal on your body including hair pins, jewelry, and body piercing             Do not wear make-up, lotions, powders, perfumes/cologne, or deodorant  Do not wear nail polish including gel and S&S, artificial/acrylic nails, or any other type of covering on natural nails including finger and toenails. If you have artificial nails, gel coating, etc. that needs to be removed by a nail salon please have this removed prior to surgery or surgery may need to be canceled/ delayed if the surgeon/ anesthesia feels like they are unable to be safely monitored.   Do not shave  48 hours prior to surgery.    Do not bring valuables to the hospital. [facility] IS NOT             RESPONSIBLE   FOR VALUABLES.   Contacts, glasses, dentures or bridgework may not be worn into surgery.   Bring small overnight bag day of surgery.   DO NOT BRING YOUR HOME MEDICATIONS TO THE HOSPITAL. PHARMACY WILL DISPENSE MEDICATIONS LISTED ON YOUR MEDICATION LIST TO YOU DURING YOUR ADMISSION IN THE  HOSPITAL!    Patients discharged on the day of surgery will not be allowed to drive home.  Someone NEEDS to stay with you for the first 24 hours after anesthesia.   Special Instructions: Bring a copy of your healthcare power of attorney and living will documents the day of surgery if you haven't scanned them before.              Please read over the following fact sheets you were given: IF YOU HAVE QUESTIONS ABOUT YOUR PRE-OP INSTRUCTIONS PLEASE CALL [phone] [facility]y [facility]   If you received a COVID test during your pre-op visit  it is requested that you wear a mask when out in public, stay away from anyone that may not be feeling well and notify your surgeon if you develop symptoms. If you test positive for Covid or have been in contact with anyone that has tested positive in the last 10 days please notify you surgeon.      Pre-operative 5 CHG Bath Instructions   You can play a key role in reducing the risk of infection after surgery. Your skin needs to be as free of germs as possible. You can reduce the number of germs on your skin by washing with CHG (chlorhexidine gluconate) soap before surgery. CHG is an antiseptic soap that kills germs and continues to kill germs even after washing.   DO NOT use if you have an allergy to chlorhexidine/CHG or antibacterial soaps. If your skin becomes reddened or irritated, stop using the CHG and notify one of our RNs at [phone].   Please shower with the CHG soap starting 4 days before surgery using the following schedule:     Please  keep in mind the following:  DO NOT shave, including legs and underarms, starting the day of your first shower.   You may shave your face at any point before/day of surgery.  Place clean sheets on your bed the day you start using CHG soap. Use a clean washcloth (not used since being washed) for each shower. DO NOT sleep with pets once you start using the CHG.   CHG Shower Instructions:  If you choose to wash your hair  and private area, wash first with your normal shampoo/soap.  After you use shampoo/soap, rinse your hair and body thoroughly to remove shampoo/soap residue.  Turn the water OFF and apply about 3 tablespoons (45 ml) of CHG soap to a CLEAN washcloth.  Apply CHG soap ONLY FROM YOUR NECK DOWN TO YOUR TOES (washing for 3-5 minutes)  DO NOT use CHG soap on face, private areas, open wounds, or sores.  Pay special attention to the area where your surgery is being performed.  If you are having back surgery, having someone wash your back for you may be helpful. Wait 2 minutes after CHG soap is applied, then you may rinse off the CHG soap.  Pat dry with a clean towel  Put on clean clothes/pajamas   If you choose to wear lotion, please use ONLY the CHG-compatible lotions on the back of this paper.     Additional instructions for the day of surgery: DO NOT APPLY any lotions, deodorants, cologne, or perfumes.   Put on clean/comfortable clothes.  Brush your teeth.  Ask your nurse before applying any prescription medications to the skin.      CHG Compatible Lotions   Aveeno Moisturizing lotion  Cetaphil Moisturizing Cream  Cetaphil Moisturizing Lotion  Clairol Herbal Essence Moisturizing Lotion, Dry Skin  Clairol Herbal Essence Moisturizing Lotion, Extra Dry Skin  Clairol Herbal Essence Moisturizing Lotion, Normal Skin  Curel Age Defying Therapeutic Moisturizing Lotion with Alpha Hydroxy  Curel Extreme Care Body Lotion  Curel Soothing Hands Moisturizing Hand Lotion  Curel Therapeutic Moisturizing Cream, Fragrance-Free  Curel Therapeutic Moisturizing Lotion, Fragrance-Free  Curel Therapeutic Moisturizing Lotion, Original Formula  Eucerin Daily Replenishing Lotion  Eucerin Dry Skin Therapy Plus Alpha Hydroxy Crme  Eucerin Dry Skin Therapy Plus Alpha Hydroxy Lotion  Eucerin Original Crme  Eucerin Original Lotion  Eucerin Plus Crme Eucerin Plus Lotion  Eucerin TriLipid Replenishing Lotion   Keri Anti-Bacterial Hand Lotion  Keri Deep Conditioning Original Lotion Dry Skin Formula Softly Scented  Keri Deep Conditioning Original Lotion, Fragrance Free Sensitive Skin Formula  Keri Lotion Fast Absorbing Fragrance Free Sensitive Skin Formula  Keri Lotion Fast Absorbing Softly Scented Dry Skin Formula  Keri Original Lotion  Keri Skin Renewal Lotion Keri Silky Smooth Lotion  Keri Silky Smooth Sensitive Skin Lotion  Nivea Body Creamy Conditioning Oil  Nivea Body Extra Enriched Lotion  Nivea Body Original Lotion  Nivea Body Sheer Moisturizing Lotion Nivea Crme  Nivea Skin Firming Lotion  NutraDerm 30 Skin Lotion  NutraDerm Skin Lotion  NutraDerm Therapeutic Skin Cream  NutraDerm Therapeutic Skin Lotion  ProShield Protective Hand Cream   Incentive Spirometer (Watch this video at home: [email or web address])  An incentive spirometer is a tool that can help keep your lungs clear and active. This tool measures how well you are filling your lungs with each breath. Taking long deep breaths may help reverse or decrease the chance of developing breathing (pulmonary) problems (especially infection) following: A long period of time when you are unable to move or  be active. BEFORE THE PROCEDURE  If the spirometer includes an indicator to show your best effort, your nurse or respiratory therapist will set it to a desired goal. If possible, sit up straight or lean slightly forward. Try not to slouch. Hold the incentive spirometer in an upright position. INSTRUCTIONS FOR USE  Sit on the edge of your bed if possible, or sit up as far as you can in bed or on a chair. Hold the incentive spirometer in an upright position. Breathe out normally. Place the mouthpiece in your mouth and seal your lips tightly around it. Breathe in slowly and as deeply as possible, raising the piston or the ball toward the top of the column. Hold your breath for 3-5 seconds or for as long as  possible. Allow the piston or ball to fall to the bottom of the column. Remove the mouthpiece from your mouth and breathe out normally. Rest for a few seconds and repeat Steps 1 through 7 at least 10 times every 1-2 hours when you are awake. Take your time and take a few normal breaths between deep breaths. The spirometer may include an indicator to show your best effort. Use the indicator as a goal to work toward during each repetition. After each set of 10 deep breaths, practice coughing to be sure your lungs are clear. If you have an incision (the cut made at the time of surgery), support your incision when coughing by placing a pillow or rolled up towels firmly against it. Once you are able to get out of bed, walk around indoors and cough well. You may stop using the incentive spirometer when instructed by your caregiver.  RISKS AND COMPLICATIONS Take your time so you do not get dizzy or light-headed. If you are in pain, you may need to take or ask for pain medication before doing incentive spirometry. It is harder to take a deep breath if you are having pain. AFTER USE Rest and breathe slowly and easily. It can be helpful to keep track of a log of your progress. Your caregiver can provide you with a simple table to help with this. If you are using the spirometer at home, follow these instructions: SEEK MEDICAL CARE IF:  You are having difficultly using the spirometer. You have trouble using the spirometer as often as instructed. Your pain medication is not giving enough relief while using the spirometer. You develop fever of 100.5 F (38.1 C) or higher. SEEK IMMEDIATE MEDICAL CARE IF:  You cough up bloody sputum that had not been present before. You develop fever of 102 F (38.9 C) or greater. You develop worsening pain at or near the incision site. MAKE SURE YOU:  Understand these instructions. Will watch your condition. Will get help right away if you are not doing well or get  worse. Document Released: 10/24/2006 Document Revised: 09/05/2011 Document Reviewed: 12/25/2006   WHAT IS A BLOOD TRANSFUSION? Blood Transfusion Information  A transfusion is the replacement of blood or some of its parts. Blood is made up of multiple cells which provide different functions. Red blood cells carry oxygen and are used for blood loss replacement. White blood cells fight against infection. Platelets control bleeding. Plasma helps clot blood. Other blood products are available for specialized needs, such as hemophilia or other clotting disorders. BEFORE THE TRANSFUSION  Who gives blood for transfusions?  Healthy volunteers who are fully evaluated to make sure their blood is safe. This is blood bank blood. Transfusion therapy is the  safest it has ever been in the practice of medicine. Before blood is taken from a donor, a complete history is taken to make sure that person has no history of diseases nor engages in risky social behavior (examples are intravenous drug use or sexual activity with multiple partners). The donor's travel history is screened to minimize risk of transmitting infections, such as malaria. The donated blood is tested for signs of infectious diseases, such as HIV and hepatitis. The blood is then tested to be sure it is compatible with you in order to minimize the chance of a transfusion reaction. If you or a relative donates blood, this is often done in anticipation of surgery and is not appropriate for emergency situations. It takes many days to process the donated blood. RISKS AND COMPLICATIONS Although transfusion therapy is very safe and saves many lives, the main dangers of transfusion include:  Getting an infectious disease. Developing a transfusion reaction. This is an allergic reaction to something in the blood you were given. Every precaution is taken to prevent this. The decision to have a blood transfusion has been considered carefully by your caregiver  before blood is given. Blood is not given unless the benefits outweigh the risks. AFTER THE TRANSFUSION Right after receiving a blood transfusion, you will usually feel much better and more energetic. This is especially true if your red blood cells have gotten low (anemic). The transfusion raises the level of the red blood cells which carry oxygen, and this usually causes an energy increase. The nurse administering the transfusion will monitor you carefully for complications. HOME CARE INSTRUCTIONS  No special instructions are needed after a transfusion. You may find your energy is better. Speak with your caregiver about any limitations on activity for underlying diseases you may have. SEEK MEDICAL CARE IF:  Your condition is not improving after your transfusion. You develop redness or irritation at the intravenous (IV) site. SEEK IMMEDIATE MEDICAL CARE IF:  Any of the following symptoms occur over the next 12 hours: Shaking chills. You have a temperature by mouth above 102 F (38.9 C), not controlled by medicine. Chest, back, or muscle pain. People around you feel you are not acting correctly or are confused. Shortness of breath or difficulty breathing. Dizziness and fainting. You get a rash or develop hives. You have a decrease in urine output. Your urine turns a dark color or changes to pink, red, or brown. Any of the following symptoms occur over the next 10 days: You have a temperature by mouth above 102 F (38.9 C), not controlled by medicine. Shortness of breath. Weakness after normal activity. The white part of the eye turns yellow (jaundice). You have a decrease in the amount of urine or are urinating less often. Your urine turns a dark color or changes to pink, red, or brown. Document Released: 06/10/2000 Document Revised: 09/05/2011 Document Reviewed: 01/28/2008 [facility] Patient Information 2014 [place], [place].How to Manage Your Diabetes Before and After Surgery  Why is  it important to control my blood sugar before and after surgery? Improving blood sugar levels before and after surgery helps healing and can limit problems. A way of improving blood sugar control is eating a healthy diet by:  Eating less sugar and carbohydrates  Increasing activity/exercise  Talking with your doctor about reaching your blood sugar goals High blood sugars (greater than 180 mg/dL) can raise your risk of infections and slow your recovery, so you will need to focus on controlling your diabetes during the weeks  before surgery. Make sure that the doctor who takes care of your diabetes knows about your planned surgery including the date and location.  How do I manage my blood sugar before surgery? Check your blood sugar at least 4 times a day, starting 2 days before surgery, to make sure that the level is not too high or low. Check your blood sugar the morning of your surgery when you wake up and every 2 hours until you get to the Short Stay unit. If your blood sugar is less than 70 mg/dL, you will need to treat for low blood sugar: Do not take insulin. Treat a low blood sugar (less than 70 mg/dL) with  cup of clear juice (cranberry or apple), 4 glucose tablets, OR glucose gel. Recheck blood sugar in 15 minutes after treatment (to make sure it is greater than 70 mg/dL). If your blood sugar is not greater than 70 mg/dL on recheck, call [phone] for further instructions. Report your blood sugar to the short stay nurse when you get to Short Stay.  If you are admitted to the hospital after surgery: Your blood sugar will be checked by the staff and you will probably be given insulin after surgery (instead of oral diabetes medicines) to make sure you have good blood sugar levels. The goal for blood sugar control after surgery is 80-180 mg/dL.   WHAT DO I DO ABOUT MY DIABETES MEDICATION?  Do not take oral diabetes medicines (pills) the morning of surgery.       THE MORNING OF  SURGERY,  take only half of Glargine insulin.        If blood sugar >220 take half of Novolog insulin otherwise don't take it. DO NOT TAKE THE FOLLOWING 7 DAYS PRIOR TO SURGERY: Ozempic, Wegovy, Rybelsus (Semaglutide), Byetta (exenatide), Bydureon (exenatide ER), Victoza, Saxenda (liraglutide), or Trulicity (dulaglutide) Mounjaro (Tirzepatide) Adlyxin (Lixisenatide), Polyethylene Glycol Loxenatide.  If your CBG is greater than 220 mg/dL, you may take  of your sliding scale  (correction) dose of insulin.    For patients with insulin pumps: Contact your diabetes doctor for specific instructions before surgery. Decrease basal rates by 20% at midnight the night before your surgery. Note that if your surgery is planned to be longer than 2 hours, your insulin pump will be removed and intravenous (IV) insulin will be started and managed by the nurses and the anesthesiologist. You will be able to restart your insulin pump once you are awake and able to manage it.  Make sure to bring insulin pump supplies to the hospital with you in case the  site needs to be changed.  Patient Signature:  Date:   Nurse Signature:  Date:   Reviewed and Endorsed by [facility] Patient Education Committee, August 2015

## 2023-08-31 NOTE — Progress Notes (Addendum)
 COVID Vaccine received:  []  No [x]  Yes Date of any COVID positive Test in last 90 days: no PCP - Renford Dills MD Cardiologist - Nanetta Batty MD  Chest x-ray -  EKG -  04/05/23 Epic Stress Test - 04/18/23 Epic ECHO - 05/03/23 Epic Cardiac Cath -   Bowel Prep - [x]  No  []   Yes ______  Pacemaker / ICD device [x]  No []  Yes   Spinal Cord Stimulator:[x]  No []  Yes       History of Sleep Apnea? [x]  No []  Yes   CPAP used?- [x]  No []  Yes    Does the patient monitor blood sugar?          []  No [x]  Yes  []  N/A  Patient has: []  NO Hx DM   []  Pre-DM                 []  DM1  [x]   DM2 Does patient have a [employer] or Dexacom? [x]  No []  Yes   Fasting Blood Sugar Ranges- 123 Checks Blood Sugar __4___ times a day  GLP1 agonist / usual dose - no GLP1 instructions:  SGLT-2 inhibitors / usual dose - no SGLT-2 instructions:   Blood Thinner / Instructions:Eliquis. Will stop 09/01/23 PM Aspirin Instructions:no  Comments:   Activity level: Patient is able  to climb a flight of stairs without difficulty; [x]  No CP  []  No SOB,  _   Patient  can not perform ADLs without assistance.   Anesthesia review: PAD, HTN, DM, CKD, A-fib, smoker, COPD  Patient denies shortness of breath, fever, cough and chest pain at PAT appointment.  Patient verbalized understanding and agreement to the Pre-Surgical Instructions that were given to them at this PAT appointment. Patient was also educated of the need to review these PAT instructions again prior to his/her surgery.I reviewed the appropriate phone numbers to call if they have any and questions or concerns.

## 2023-09-01 ENCOUNTER — Encounter (HOSPITAL_COMMUNITY): Payer: Self-pay

## 2023-09-01 ENCOUNTER — Other Ambulatory Visit: Payer: Self-pay

## 2023-09-01 ENCOUNTER — Encounter (HOSPITAL_COMMUNITY)
Admission: RE | Admit: 2023-09-01 | Discharge: 2023-09-01 | Disposition: A | Discharge status: Home / Self Care | Payer: Medicare HMO | Source: Ambulatory Visit | Attending: Orthopedic Surgery | Admitting: Orthopedic Surgery

## 2023-09-01 VITALS — BP 165/82 | HR 59 | Temp 97.8°F | Resp 16 | Ht 63.5 in | Wt 110.0 lb

## 2023-09-01 DIAGNOSIS — E1122 Type 2 diabetes mellitus with diabetic chronic kidney disease: Secondary | ICD-10-CM | POA: Diagnosis not present

## 2023-09-01 DIAGNOSIS — N1832 Chronic kidney disease, stage 3b: Secondary | ICD-10-CM | POA: Diagnosis not present

## 2023-09-01 DIAGNOSIS — I129 Hypertensive chronic kidney disease with stage 1 through stage 4 chronic kidney disease, or unspecified chronic kidney disease: Secondary | ICD-10-CM | POA: Insufficient documentation

## 2023-09-01 DIAGNOSIS — Z01812 Encounter for preprocedural laboratory examination: Secondary | ICD-10-CM | POA: Insufficient documentation

## 2023-09-01 DIAGNOSIS — Z794 Long term (current) use of insulin: Secondary | ICD-10-CM | POA: Insufficient documentation

## 2023-09-01 DIAGNOSIS — E119 Type 2 diabetes mellitus without complications: Secondary | ICD-10-CM

## 2023-09-01 DIAGNOSIS — Z01818 Encounter for other preprocedural examination: Secondary | ICD-10-CM

## 2023-09-01 DIAGNOSIS — I10 Essential (primary) hypertension: Secondary | ICD-10-CM

## 2023-09-01 LAB — BASIC METABOLIC PANEL
Anion gap: 10 (ref 5–15)
BUN: 46 mg/dL — ABNORMAL HIGH (ref 8–23)
CO2: 25 mmol/L (ref 22–32)
Calcium: 9.3 mg/dL (ref 8.9–10.3)
Chloride: 106 mmol/L (ref 98–111)
Creatinine, Ser: 1.68 mg/dL — ABNORMAL HIGH (ref 0.44–1.00)
GFR, Estimated: 31 mL/min — ABNORMAL LOW (ref >60)
Glucose, Bld: 90 mg/dL (ref 70–99)
Potassium: 3.7 mmol/L (ref 3.5–5.1)
Sodium: 141 mmol/L (ref 135–145)

## 2023-09-01 LAB — CBC
HCT: 37.1 % (ref 36.0–46.0)
Hemoglobin: 11.7 g/dL — ABNORMAL LOW (ref 12.0–15.0)
MCH: 32.4 pg (ref 26.0–34.0)
MCHC: 31.5 g/dL (ref 30.0–36.0)
MCV: 102.8 fL — ABNORMAL HIGH (ref 80.0–100.0)
Platelets: 401 10*3/uL — ABNORMAL HIGH (ref 150–400)
RBC: 3.61 MIL/uL — ABNORMAL LOW (ref 3.87–5.11)
RDW: 14.7 % (ref 11.5–15.5)
WBC: 11 10*3/uL — ABNORMAL HIGH (ref 4.0–10.5)
nRBC: 0 % (ref 0.0–0.2)

## 2023-09-01 LAB — HEMOGLOBIN A1C
Hgb A1c MFr Bld: 5.4 % (ref 4.8–5.6)
Mean Plasma Glucose: 108.28 mg/dL

## 2023-09-01 LAB — GLUCOSE, CAPILLARY
Glucose-Capillary: 55 mg/dL — ABNORMAL LOW (ref 70–99)
Glucose-Capillary: 71 mg/dL (ref 70–99)

## 2023-09-04 ENCOUNTER — Other Ambulatory Visit: Payer: Self-pay | Admitting: Cardiovascular Disease

## 2023-09-04 DIAGNOSIS — I480 Paroxysmal atrial fibrillation: Secondary | ICD-10-CM

## 2023-09-04 NOTE — Progress Notes (Signed)
 Please review pt's pre op BMP from 09/01/23.

## 2023-09-05 NOTE — Anesthesia Preprocedure Evaluation (Signed)
 Anesthesia Evaluation  Patient identified by MRN, date of birth, ID band Patient awake    Reviewed: Allergy & Precautions, NPO status , Patient's Chart, lab work & pertinent test results  History of Anesthesia Complications Negative for: history of anesthetic complications  Airway Mallampati: III  TM Distance: >3 FB Neck ROM: Full    Dental no notable dental hx. (+) Teeth Intact, Dental Advisory Given   Pulmonary COPD,  COPD inhaler, Current SmokerPatient did not abstain from smoking.   Pulmonary exam normal breath sounds clear to auscultation       Cardiovascular hypertension, (-) angina + Peripheral Vascular Disease  (-) Past MI Normal cardiovascular exam+ dysrhythmias Atrial Fibrillation  Rhythm:Regular Rate:Normal     Neuro/Psych  Neuromuscular disease    GI/Hepatic negative GI ROS, Neg liver ROS,,,  Endo/Other  diabetes, Type 1Hypothyroidism    Renal/GU Renal diseaseLab Results      Component                Value               Date                        K                        3.7                 09/01/2023                CO2                      25                  09/01/2023                BUN                      46 (H)              09/01/2023                CREATININE               1.68 (H)            09/01/2023                GFRNONAA                 31 (L)              09/01/2023                          Musculoskeletal  (+) Arthritis , Osteoarthritis,    Abdominal   Peds  Hematology Pt on eliquis check last dose  Lab Results      Component                Value               Date                      WBC                      11.0 (H)            09/01/2023  HGB                      11.7 (L)            09/01/2023                HCT                      37.1                09/01/2023                MCV                      102.8 (H)           09/01/2023                PLT                       401 (H)             09/01/2023              Anesthesia Other Findings All: latex, amoxicillin  Reproductive/Obstetrics                             Anesthesia Physical Anesthesia Plan  ASA: 3  Anesthesia Plan: Spinal   Post-op Pain Management: Tylenol PO (pre-op)*   Induction:   PONV Risk Score and Plan: Treatment may vary due to age or medical condition, Ondansetron and Propofol infusion  Airway Management Planned: Nasal Cannula and Natural Airway  Additional Equipment: None  Intra-op Plan:   Post-operative Plan:   Informed Consent: I have reviewed the patients History and Physical, chart, labs and discussed the procedure including the risks, benefits and alternatives for the proposed anesthesia with the patient or authorized representative who has indicated his/her understanding and acceptance.     Dental advisory given  Plan Discussed with: CRNA and Surgeon  Anesthesia Plan Comments:        Anesthesia Quick Evaluation

## 2023-09-06 ENCOUNTER — Observation Stay (HOSPITAL_COMMUNITY)

## 2023-09-06 ENCOUNTER — Inpatient Hospital Stay (HOSPITAL_COMMUNITY)
Admission: RE | Admit: 2023-09-06 | Discharge: 2023-09-18 | DRG: 470 | Disposition: A | Discharge status: Skilled Nursing Facility | Payer: Medicare HMO | Attending: Orthopedic Surgery | Admitting: Orthopedic Surgery

## 2023-09-06 ENCOUNTER — Ambulatory Visit (HOSPITAL_COMMUNITY)

## 2023-09-06 ENCOUNTER — Other Ambulatory Visit: Payer: Self-pay

## 2023-09-06 ENCOUNTER — Ambulatory Visit (HOSPITAL_COMMUNITY): Payer: Self-pay | Admitting: Physician Assistant

## 2023-09-06 ENCOUNTER — Encounter (HOSPITAL_COMMUNITY)
Admission: RE | Disposition: A | Discharge status: Skilled Nursing Facility | Payer: Self-pay | Source: Home / Self Care | Attending: Orthopedic Surgery

## 2023-09-06 ENCOUNTER — Ambulatory Visit (HOSPITAL_COMMUNITY): Payer: Self-pay | Admitting: Anesthesiology

## 2023-09-06 ENCOUNTER — Encounter (HOSPITAL_COMMUNITY): Payer: Self-pay | Admitting: Orthopedic Surgery

## 2023-09-06 DIAGNOSIS — Z880 Allergy status to penicillin: Secondary | ICD-10-CM

## 2023-09-06 DIAGNOSIS — E440 Moderate protein-calorie malnutrition: Secondary | ICD-10-CM | POA: Diagnosis present

## 2023-09-06 DIAGNOSIS — Z91040 Latex allergy status: Secondary | ICD-10-CM

## 2023-09-06 DIAGNOSIS — E119 Type 2 diabetes mellitus without complications: Secondary | ICD-10-CM

## 2023-09-06 DIAGNOSIS — E1122 Type 2 diabetes mellitus with diabetic chronic kidney disease: Secondary | ICD-10-CM | POA: Diagnosis present

## 2023-09-06 DIAGNOSIS — F17210 Nicotine dependence, cigarettes, uncomplicated: Secondary | ICD-10-CM

## 2023-09-06 DIAGNOSIS — Z79890 Hormone replacement therapy (postmenopausal): Secondary | ICD-10-CM

## 2023-09-06 DIAGNOSIS — E782 Mixed hyperlipidemia: Secondary | ICD-10-CM | POA: Diagnosis present

## 2023-09-06 DIAGNOSIS — I129 Hypertensive chronic kidney disease with stage 1 through stage 4 chronic kidney disease, or unspecified chronic kidney disease: Secondary | ICD-10-CM | POA: Diagnosis present

## 2023-09-06 DIAGNOSIS — Z471 Aftercare following joint replacement surgery: Secondary | ICD-10-CM | POA: Diagnosis not present

## 2023-09-06 DIAGNOSIS — M810 Age-related osteoporosis without current pathological fracture: Secondary | ICD-10-CM | POA: Diagnosis present

## 2023-09-06 DIAGNOSIS — Z96642 Presence of left artificial hip joint: Secondary | ICD-10-CM | POA: Diagnosis not present

## 2023-09-06 DIAGNOSIS — S32401A Unspecified fracture of right acetabulum, initial encounter for closed fracture: Secondary | ICD-10-CM | POA: Diagnosis not present

## 2023-09-06 DIAGNOSIS — M1611 Unilateral primary osteoarthritis, right hip: Principal | ICD-10-CM | POA: Diagnosis present

## 2023-09-06 DIAGNOSIS — I10 Essential (primary) hypertension: Secondary | ICD-10-CM | POA: Diagnosis not present

## 2023-09-06 DIAGNOSIS — M87051 Idiopathic aseptic necrosis of right femur: Secondary | ICD-10-CM | POA: Diagnosis present

## 2023-09-06 DIAGNOSIS — E039 Hypothyroidism, unspecified: Secondary | ICD-10-CM | POA: Diagnosis present

## 2023-09-06 DIAGNOSIS — N1832 Chronic kidney disease, stage 3b: Secondary | ICD-10-CM | POA: Diagnosis present

## 2023-09-06 DIAGNOSIS — M8788 Other osteonecrosis, other site: Principal | ICD-10-CM | POA: Diagnosis present

## 2023-09-06 DIAGNOSIS — E1142 Type 2 diabetes mellitus with diabetic polyneuropathy: Secondary | ICD-10-CM | POA: Diagnosis present

## 2023-09-06 DIAGNOSIS — I480 Paroxysmal atrial fibrillation: Secondary | ICD-10-CM | POA: Diagnosis present

## 2023-09-06 DIAGNOSIS — Z96641 Presence of right artificial hip joint: Principal | ICD-10-CM | POA: Diagnosis present

## 2023-09-06 DIAGNOSIS — Z7951 Long term (current) use of inhaled steroids: Secondary | ICD-10-CM

## 2023-09-06 DIAGNOSIS — E1165 Type 2 diabetes mellitus with hyperglycemia: Secondary | ICD-10-CM | POA: Diagnosis not present

## 2023-09-06 DIAGNOSIS — Z885 Allergy status to narcotic agent status: Secondary | ICD-10-CM

## 2023-09-06 DIAGNOSIS — K5900 Constipation, unspecified: Secondary | ICD-10-CM | POA: Diagnosis present

## 2023-09-06 DIAGNOSIS — Z7901 Long term (current) use of anticoagulants: Secondary | ICD-10-CM

## 2023-09-06 DIAGNOSIS — Z79899 Other long term (current) drug therapy: Secondary | ICD-10-CM

## 2023-09-06 DIAGNOSIS — D62 Acute posthemorrhagic anemia: Secondary | ICD-10-CM | POA: Diagnosis not present

## 2023-09-06 DIAGNOSIS — Z8249 Family history of ischemic heart disease and other diseases of the circulatory system: Secondary | ICD-10-CM

## 2023-09-06 DIAGNOSIS — Z681 Body mass index (BMI) 19 or less, adult: Secondary | ICD-10-CM

## 2023-09-06 DIAGNOSIS — J449 Chronic obstructive pulmonary disease, unspecified: Secondary | ICD-10-CM

## 2023-09-06 DIAGNOSIS — Z794 Long term (current) use of insulin: Secondary | ICD-10-CM

## 2023-09-06 DIAGNOSIS — Z96643 Presence of artificial hip joint, bilateral: Secondary | ICD-10-CM | POA: Diagnosis not present

## 2023-09-06 DIAGNOSIS — J4489 Other specified chronic obstructive pulmonary disease: Secondary | ICD-10-CM | POA: Diagnosis present

## 2023-09-06 DIAGNOSIS — Z751 Person awaiting admission to adequate facility elsewhere: Secondary | ICD-10-CM

## 2023-09-06 DIAGNOSIS — Z888 Allergy status to other drugs, medicaments and biological substances status: Secondary | ICD-10-CM

## 2023-09-06 DIAGNOSIS — E1169 Type 2 diabetes mellitus with other specified complication: Secondary | ICD-10-CM | POA: Diagnosis present

## 2023-09-06 HISTORY — PX: TOTAL HIP ARTHROPLASTY: SHX124

## 2023-09-06 LAB — GLUCOSE, CAPILLARY
Glucose-Capillary: 109 mg/dL — ABNORMAL HIGH (ref 70–99)
Glucose-Capillary: 89 mg/dL (ref 70–99)
Glucose-Capillary: 100 mg/dL — ABNORMAL HIGH (ref 70–99)

## 2023-09-06 SURGERY — ARTHROPLASTY, HIP, TOTAL, ANTERIOR APPROACH
Anesthesia: Spinal | Site: Hip | Laterality: Right

## 2023-09-06 MED ORDER — ONDANSETRON HCL 4 MG/2ML IJ SOLN
INTRAMUSCULAR | Status: AC
  Filled 2023-09-06: qty 2

## 2023-09-06 MED ORDER — VITAMIN C 500 MG PO TABS
500.0000 mg | ORAL_TABLET | Freq: Every day | ORAL | Status: DC
  Administered 2023-09-07 – 2023-09-18 (×12): 500 mg via ORAL
  Filled 2023-09-06 (×12): qty 1

## 2023-09-06 MED ORDER — PANTOPRAZOLE SODIUM 40 MG PO TBEC
40.0000 mg | DELAYED_RELEASE_TABLET | Freq: Every day | ORAL | Status: DC
  Administered 2023-09-07 – 2023-09-18 (×12): 40 mg via ORAL
  Filled 2023-09-06 (×12): qty 1

## 2023-09-06 MED ORDER — MORPHINE SULFATE (PF) 2 MG/ML IV SOLN: 0.5000 mg | INTRAVENOUS | Status: DC | PRN

## 2023-09-06 MED ORDER — POVIDONE-IODINE 10 % EX SWAB
2.0000 | Freq: Once | CUTANEOUS | Status: DC
Start: 2023-09-06 — End: 2023-09-06

## 2023-09-06 MED ORDER — MENTHOL 3 MG MT LOZG: 1.0000 | LOZENGE | OROMUCOSAL | Status: DC | PRN

## 2023-09-06 MED ORDER — HYDROCHLOROTHIAZIDE 25 MG PO TABS
25.0000 mg | ORAL_TABLET | Freq: Every day | ORAL | Status: DC
  Administered 2023-09-07 – 2023-09-13 (×7): 25 mg via ORAL
  Filled 2023-09-06 (×7): qty 1

## 2023-09-06 MED ORDER — BUPIVACAINE-EPINEPHRINE (PF) 0.25% -1:200000 IJ SOLN
INTRAMUSCULAR | Status: AC
  Filled 2023-09-06: qty 30

## 2023-09-06 MED ORDER — FENTANYL CITRATE (PF) 100 MCG/2ML IJ SOLN
INTRAMUSCULAR | Status: DC | PRN
  Administered 2023-09-06: 50 ug via INTRAVENOUS

## 2023-09-06 MED ORDER — ATORVASTATIN CALCIUM 40 MG PO TABS
80.0000 mg | ORAL_TABLET | Freq: Every morning | ORAL | Status: DC
  Administered 2023-09-07 – 2023-09-18 (×12): 80 mg via ORAL
  Filled 2023-09-06 (×12): qty 2

## 2023-09-06 MED ORDER — DOCUSATE SODIUM 100 MG PO CAPS
100.0000 mg | ORAL_CAPSULE | Freq: Two times a day (BID) | ORAL | Status: DC
  Administered 2023-09-07 – 2023-09-18 (×22): 100 mg via ORAL
  Filled 2023-09-06 (×23): qty 1

## 2023-09-06 MED ORDER — FENTANYL CITRATE (PF) 100 MCG/2ML IJ SOLN
INTRAMUSCULAR | Status: AC
  Filled 2023-09-06: qty 2

## 2023-09-06 MED ORDER — METHOCARBAMOL 1000 MG/10ML IJ SOLN
500.0000 mg | Freq: Four times a day (QID) | INTRAMUSCULAR | Status: DC | PRN
  Administered 2023-09-07: 500 mg via INTRAVENOUS
  Filled 2023-09-06: qty 10

## 2023-09-06 MED ORDER — PHENYLEPHRINE HCL-NACL 20-0.9 MG/250ML-% IV SOLN
INTRAVENOUS | Status: DC | PRN
  Administered 2023-09-06: 25 ug/min via INTRAVENOUS

## 2023-09-06 MED ORDER — ZINC 50 MG PO TABS: 50.0000 mg | ORAL_TABLET | Freq: Every day | ORAL | Status: DC

## 2023-09-06 MED ORDER — MUPIROCIN 2 % EX OINT: 1.0000 | TOPICAL_OINTMENT | Freq: Two times a day (BID) | CUTANEOUS | 0 refills | Status: AC

## 2023-09-06 MED ORDER — MOMETASONE FURO-FORMOTEROL FUM 200-5 MCG/ACT IN AERO
2.0000 | INHALATION_SPRAY | Freq: Two times a day (BID) | RESPIRATORY_TRACT | Status: DC
  Administered 2023-09-07 – 2023-09-18 (×23): 2 via RESPIRATORY_TRACT
  Filled 2023-09-06 (×2): qty 8.8

## 2023-09-06 MED ORDER — INSULIN ASPART 100 UNIT/ML IJ SOLN: 0.0000 [IU] | INTRAMUSCULAR | Status: DC | PRN

## 2023-09-06 MED ORDER — SODIUM CHLORIDE 0.9 % IR SOLN
Status: DC | PRN
  Administered 2023-09-06: 250 mL
  Administered 2023-09-06: 3000 mL

## 2023-09-06 MED ORDER — DIPHENHYDRAMINE HCL 12.5 MG/5ML PO ELIX: 12.5000 mg | ORAL_SOLUTION | ORAL | Status: DC | PRN

## 2023-09-06 MED ORDER — FERROUS SULFATE 325 (65 FE) MG PO TABS
325.0000 mg | ORAL_TABLET | Freq: Every day | ORAL | Status: DC
  Administered 2023-09-07 – 2023-09-15 (×9): 325 mg via ORAL
  Filled 2023-09-06 (×9): qty 1

## 2023-09-06 MED ORDER — TRANEXAMIC ACID-NACL 1000-0.7 MG/100ML-% IV SOLN
1000.0000 mg | INTRAVENOUS | Status: AC
  Administered 2023-09-06: 1000 mg via INTRAVENOUS
  Filled 2023-09-06: qty 100

## 2023-09-06 MED ORDER — DEXAMETHASONE SODIUM PHOSPHATE 10 MG/ML IJ SOLN
INTRAMUSCULAR | Status: AC
  Filled 2023-09-06: qty 1

## 2023-09-06 MED ORDER — ALBUTEROL SULFATE (2.5 MG/3ML) 0.083% IN NEBU: 2.5000 mg | INHALATION_SOLUTION | Freq: Four times a day (QID) | RESPIRATORY_TRACT | Status: DC | PRN

## 2023-09-06 MED ORDER — ALBUMIN HUMAN 5 % IV SOLN: INTRAVENOUS | Status: DC | PRN

## 2023-09-06 MED ORDER — SODIUM CHLORIDE (PF) 0.9 % IJ SOLN
INTRAMUSCULAR | Status: DC | PRN
  Administered 2023-09-06: 61 mL

## 2023-09-06 MED ORDER — LACTATED RINGERS IV SOLN: INTRAVENOUS | Status: DC

## 2023-09-06 MED ORDER — POLYETHYLENE GLYCOL 3350 17 G PO PACK
17.0000 g | PACK | Freq: Every day | ORAL | Status: DC | PRN
  Administered 2023-09-12 – 2023-09-16 (×2): 17 g via ORAL
  Filled 2023-09-06 (×2): qty 1

## 2023-09-06 MED ORDER — ONDANSETRON HCL 4 MG/2ML IJ SOLN
4.0000 mg | Freq: Once | INTRAMUSCULAR | Status: AC | PRN
  Administered 2023-09-06: 4 mg via INTRAVENOUS

## 2023-09-06 MED ORDER — VANCOMYCIN HCL 1000 MG IV SOLR
INTRAVENOUS | Status: DC | PRN
  Administered 2023-09-06: 1000 mg via TOPICAL

## 2023-09-06 MED ORDER — ACETAMINOPHEN 500 MG PO TABS
500.0000 mg | ORAL_TABLET | Freq: Four times a day (QID) | ORAL | Status: DC
  Filled 2023-09-06: qty 1

## 2023-09-06 MED ORDER — ONDANSETRON HCL 4 MG PO TABS
4.0000 mg | ORAL_TABLET | Freq: Four times a day (QID) | ORAL | Status: DC | PRN
  Administered 2023-09-08 – 2023-09-15 (×2): 4 mg via ORAL
  Filled 2023-09-06 (×3): qty 1

## 2023-09-06 MED ORDER — FENTANYL CITRATE PF 50 MCG/ML IJ SOSY
PREFILLED_SYRINGE | INTRAMUSCULAR | Status: AC
  Filled 2023-09-06: qty 1

## 2023-09-06 MED ORDER — ACETAMINOPHEN 10 MG/ML IV SOLN
650.0000 mg | Freq: Once | INTRAVENOUS | Status: DC | PRN
  Administered 2023-09-06: 650 mg via INTRAVENOUS

## 2023-09-06 MED ORDER — ISOPROPYL ALCOHOL 70 % SOLN
Status: AC
  Filled 2023-09-06: qty 480

## 2023-09-06 MED ORDER — LIDOCAINE HCL (PF) 2 % IJ SOLN
INTRAMUSCULAR | Status: AC
  Filled 2023-09-06: qty 5

## 2023-09-06 MED ORDER — ONDANSETRON HCL 4 MG/2ML IJ SOLN
INTRAMUSCULAR | Status: DC | PRN
  Administered 2023-09-06: 4 mg via INTRAVENOUS

## 2023-09-06 MED ORDER — SODIUM CHLORIDE 0.9 % IV SOLN: INTRAVENOUS | Status: DC

## 2023-09-06 MED ORDER — ISOPROPYL ALCOHOL 70 % SOLN
Status: DC | PRN
  Administered 2023-09-06: 1 via TOPICAL

## 2023-09-06 MED ORDER — METOCLOPRAMIDE HCL 5 MG/ML IJ SOLN
5.0000 mg | Freq: Three times a day (TID) | INTRAMUSCULAR | Status: DC | PRN
  Administered 2023-09-07: 10 mg via INTRAVENOUS
  Filled 2023-09-06: qty 2

## 2023-09-06 MED ORDER — PHENOL 1.4 % MT LIQD: 1.0000 | OROMUCOSAL | Status: DC | PRN

## 2023-09-06 MED ORDER — LEVOTHYROXINE SODIUM 75 MCG PO TABS
75.0000 ug | ORAL_TABLET | Freq: Every day | ORAL | Status: DC
  Administered 2023-09-07 – 2023-09-18 (×12): 75 ug via ORAL
  Filled 2023-09-06 (×12): qty 1

## 2023-09-06 MED ORDER — FENTANYL CITRATE PF 50 MCG/ML IJ SOSY
PREFILLED_SYRINGE | INTRAMUSCULAR | Status: AC
  Administered 2023-09-06: 25 ug via INTRAVENOUS
  Filled 2023-09-06: qty 1

## 2023-09-06 MED ORDER — PRONTOSAN WOUND IRRIGATION OPTIME
TOPICAL | Status: DC | PRN
  Administered 2023-09-06: 1 via TOPICAL

## 2023-09-06 MED ORDER — 0.9 % SODIUM CHLORIDE (POUR BTL) OPTIME
TOPICAL | Status: DC | PRN
  Administered 2023-09-06: 1000 mL

## 2023-09-06 MED ORDER — LACTATED RINGERS IV SOLN
INTRAVENOUS | Status: DC
Start: 2023-09-06 — End: 2023-09-06

## 2023-09-06 MED ORDER — BUPIVACAINE HCL (PF) 0.5 % IJ SOLN
INTRAMUSCULAR | Status: DC | PRN
Start: 2023-09-06 — End: 2023-09-06
  Administered 2023-09-06: 10 mg via INTRATHECAL

## 2023-09-06 MED ORDER — SENNA 8.6 MG PO TABS
1.0000 | ORAL_TABLET | Freq: Two times a day (BID) | ORAL | Status: DC
  Administered 2023-09-07 – 2023-09-18 (×22): 8.6 mg via ORAL
  Filled 2023-09-06 (×23): qty 1

## 2023-09-06 MED ORDER — CEFAZOLIN SODIUM-DEXTROSE 2-4 GM/100ML-% IV SOLN
2.0000 g | INTRAVENOUS | Status: AC
  Administered 2023-09-06: 2 g via INTRAVENOUS
  Filled 2023-09-06: qty 100

## 2023-09-06 MED ORDER — PROPOFOL 10 MG/ML IV BOLUS
INTRAVENOUS | Status: AC
  Filled 2023-09-06: qty 20

## 2023-09-06 MED ORDER — HYDROCODONE-ACETAMINOPHEN 5-325 MG PO TABS
1.0000 | ORAL_TABLET | ORAL | Status: DC | PRN
  Administered 2023-09-07 – 2023-09-16 (×16): 1 via ORAL
  Filled 2023-09-06 (×17): qty 1

## 2023-09-06 MED ORDER — BISACODYL 10 MG RE SUPP
10.0000 mg | Freq: Every day | RECTAL | Status: DC | PRN
  Administered 2023-09-13: 10 mg via RECTAL
  Filled 2023-09-06: qty 1

## 2023-09-06 MED ORDER — METOCLOPRAMIDE HCL 5 MG PO TABS
5.0000 mg | ORAL_TABLET | Freq: Three times a day (TID) | ORAL | Status: DC | PRN
  Filled 2023-09-06: qty 2

## 2023-09-06 MED ORDER — ALBUTEROL SULFATE HFA 108 (90 BASE) MCG/ACT IN AERS: 2.0000 | INHALATION_SPRAY | Freq: Four times a day (QID) | RESPIRATORY_TRACT | Status: DC | PRN

## 2023-09-06 MED ORDER — CLOBETASOL PROPIONATE 0.05 % EX SOLN: 1.0000 | Freq: Two times a day (BID) | CUTANEOUS | Status: DC | PRN

## 2023-09-06 MED ORDER — APIXABAN 2.5 MG PO TABS
2.5000 mg | ORAL_TABLET | Freq: Two times a day (BID) | ORAL | Status: DC
  Administered 2023-09-07 – 2023-09-08 (×3): 2.5 mg via ORAL
  Filled 2023-09-06 (×3): qty 1

## 2023-09-06 MED ORDER — HYDROCODONE-ACETAMINOPHEN 7.5-325 MG PO TABS
1.0000 | ORAL_TABLET | ORAL | Status: DC | PRN
  Administered 2023-09-11 – 2023-09-18 (×10): 1 via ORAL
  Filled 2023-09-06 (×11): qty 1

## 2023-09-06 MED ORDER — ALUM & MAG HYDROXIDE-SIMETH 200-200-20 MG/5ML PO SUSP: 30.0000 mL | ORAL | Status: DC | PRN

## 2023-09-06 MED ORDER — VANCOMYCIN HCL 1000 MG IV SOLR
INTRAVENOUS | Status: AC
  Filled 2023-09-06: qty 20

## 2023-09-06 MED ORDER — ACETAMINOPHEN 10 MG/ML IV SOLN
INTRAVENOUS | Status: AC
  Filled 2023-09-06: qty 100

## 2023-09-06 MED ORDER — PROPOFOL 500 MG/50ML IV EMUL
INTRAVENOUS | Status: DC | PRN
Start: 2023-09-06 — End: 2023-09-06
  Administered 2023-09-06: 20 mg via INTRAVENOUS
  Administered 2023-09-06: 50 ug/kg/min via INTRAVENOUS
  Administered 2023-09-06: 20 mg via INTRAVENOUS

## 2023-09-06 MED ORDER — METHOCARBAMOL 500 MG PO TABS
500.0000 mg | ORAL_TABLET | Freq: Four times a day (QID) | ORAL | Status: DC | PRN
  Administered 2023-09-06 – 2023-09-15 (×11): 500 mg via ORAL
  Filled 2023-09-06 (×11): qty 1

## 2023-09-06 MED ORDER — ZINC SULFATE 220 (50 ZN) MG PO CAPS
220.0000 mg | ORAL_CAPSULE | Freq: Every day | ORAL | Status: DC
  Administered 2023-09-07 – 2023-09-15 (×9): 220 mg via ORAL
  Filled 2023-09-06 (×9): qty 1

## 2023-09-06 MED ORDER — ONDANSETRON HCL 4 MG/2ML IJ SOLN
4.0000 mg | Freq: Four times a day (QID) | INTRAMUSCULAR | Status: DC | PRN
  Administered 2023-09-07 – 2023-09-17 (×4): 4 mg via INTRAVENOUS
  Filled 2023-09-06 (×5): qty 2

## 2023-09-06 MED ORDER — CHLORHEXIDINE GLUCONATE 0.12 % MT SOLN
15.0000 mL | Freq: Once | OROMUCOSAL | Status: AC
  Administered 2023-09-06: 15 mL via OROMUCOSAL

## 2023-09-06 MED ORDER — CHLORHEXIDINE GLUCONATE 4 % EX SOLN: 1.0000 | CUTANEOUS | 1 refills | Status: DC

## 2023-09-06 MED ORDER — FENTANYL CITRATE PF 50 MCG/ML IJ SOSY
25.0000 ug | PREFILLED_SYRINGE | INTRAMUSCULAR | Status: DC | PRN
  Administered 2023-09-06: 50 ug via INTRAVENOUS
  Administered 2023-09-06: 25 ug via INTRAVENOUS

## 2023-09-06 MED ORDER — PHENYLEPHRINE HCL-NACL 20-0.9 MG/250ML-% IV SOLN
INTRAVENOUS | Status: AC
Start: 2023-09-06 — End: 2023-09-06
  Filled 2023-09-06: qty 250

## 2023-09-06 MED ORDER — ORAL CARE MOUTH RINSE: 15.0000 mL | Freq: Once | OROMUCOSAL | Status: AC

## 2023-09-06 MED ORDER — CEFAZOLIN SODIUM-DEXTROSE 2-4 GM/100ML-% IV SOLN
2.0000 g | Freq: Four times a day (QID) | INTRAVENOUS | Status: AC
  Administered 2023-09-06 – 2023-09-07 (×2): 2 g via INTRAVENOUS
  Filled 2023-09-06 (×2): qty 100

## 2023-09-06 MED ORDER — KETOROLAC TROMETHAMINE 30 MG/ML IJ SOLN
INTRAMUSCULAR | Status: AC
  Filled 2023-09-06: qty 1

## 2023-09-06 MED ORDER — POVIDONE-IODINE 10 % EX SWAB
2.0000 | Freq: Once | CUTANEOUS | Status: AC
  Administered 2023-09-06: 2 via TOPICAL

## 2023-09-06 MED ORDER — ACETAMINOPHEN 325 MG PO TABS
325.0000 mg | ORAL_TABLET | Freq: Four times a day (QID) | ORAL | Status: DC | PRN
  Administered 2023-09-08: 650 mg via ORAL
  Filled 2023-09-06: qty 2

## 2023-09-06 MED ORDER — ACETAMINOPHEN 500 MG PO TABS
1000.0000 mg | ORAL_TABLET | Freq: Once | ORAL | Status: AC
  Administered 2023-09-06: 1000 mg via ORAL
  Filled 2023-09-06: qty 2

## 2023-09-06 MED ORDER — ALBUMIN HUMAN 5 % IV SOLN
INTRAVENOUS | Status: AC
  Filled 2023-09-06: qty 250

## 2023-09-06 SURGICAL SUPPLY — 60 items
BAG COUNTER SPONGE SURGICOUNT (BAG) IMPLANT
BAG ZIPLOCK 12X15 (MISCELLANEOUS) IMPLANT
BIT DRILL RINGLOC 3.2MMX20 (BIT) IMPLANT
BIT DRILL RINGLOC 3.2X20 (BIT) ×1 IMPLANT
BONE CHIP PRESERV 30CC PCAN30 (Bone Implant) ×1 IMPLANT
CATH SILICONE 14FRX5CC (CATHETERS) IMPLANT
CHLORAPREP W/TINT 26 (MISCELLANEOUS) ×1 IMPLANT
COVER PERINEAL POST (MISCELLANEOUS) ×1 IMPLANT
COVER SURGICAL LIGHT HANDLE (MISCELLANEOUS) ×1 IMPLANT
DERMABOND ADVANCED .7 DNX12 (GAUZE/BANDAGES/DRESSINGS) ×2 IMPLANT
DRAPE IMP U-DRAPE 54X76 (DRAPES) ×1 IMPLANT
DRAPE SHEET LG 3/4 BI-LAMINATE (DRAPES) ×3 IMPLANT
DRAPE STERI IOBAN 125X83 (DRAPES) ×1 IMPLANT
DRAPE U-SHAPE 47X51 STRL (DRAPES) ×1 IMPLANT
DRSG AQUACEL AG ADV 3.5X10 (GAUZE/BANDAGES/DRESSINGS) ×1 IMPLANT
ELECT REM PT RETURN 15FT ADLT (MISCELLANEOUS) ×1 IMPLANT
GAUZE SPONGE 4X4 12PLY STRL (GAUZE/BANDAGES/DRESSINGS) ×1 IMPLANT
GLOVE BIOGEL PI IND STRL 7.5 (GLOVE) ×1 IMPLANT
GLOVE BIOGEL PI IND STRL 8.5 (GLOVE) ×1 IMPLANT
GLOVE SURG SS PI 6.5 STRL IVOR (GLOVE) IMPLANT
GLOVE SURG SS PI 8.5 STRL STRW (GLOVE) IMPLANT
GOWN SPEC L3 XXLG W/TWL (GOWN DISPOSABLE) ×1 IMPLANT
GOWN STRL REUS W/ TWL XL LVL3 (GOWN DISPOSABLE) ×1 IMPLANT
GRAFT BNE CANC CHIPS 1-8 30CC (Bone Implant) IMPLANT
HEAD FEM +3XOFST 36XMDLR (Orthopedic Implant) IMPLANT
HIP ARCOS 16X21 BRCH BODY HI (Orthopedic Implant) ×1 IMPLANT
HOLDER FOLEY CATH W/STRAP (MISCELLANEOUS) ×1 IMPLANT
HOOD PEEL AWAY T7 (MISCELLANEOUS) ×3 IMPLANT
KIT TURNOVER KIT A (KITS) IMPLANT
LINER ACETAB VIT E +5 36 F (Liner) IMPLANT
MANIFOLD NEPTUNE II (INSTRUMENTS) ×1 IMPLANT
MARKER SKIN DUAL TIP RULER LAB (MISCELLANEOUS) ×1 IMPLANT
NDL SAFETY ECLIPSE 18X1.5 (NEEDLE) ×1 IMPLANT
NDL SPNL 18GX3.5 QUINCKE PK (NEEDLE) ×1 IMPLANT
NEEDLE SPNL 18GX3.5 QUINCKE PK (NEEDLE) ×1 IMPLANT
PACK ANTERIOR HIP CUSTOM (KITS) ×1 IMPLANT
REVISION SYS HIP 16X21 BRCH (Orthopedic Implant) IMPLANT
SAW OSC TIP CART 19.5X105X1.3 (SAW) ×1 IMPLANT
SCREW BN 30X6.5XST STRL ACTB (Screw) IMPLANT
SCREW BONE 6.5X15 (Screw) IMPLANT
SCREW BONE 6.5X20 (Screw) IMPLANT
SCREW BONE 6.5X35 SELF TAP (Screw) IMPLANT
SCREW HIP 6.5X50 (Orthopedic Implant) IMPLANT
SEALER BIPOLAR AQUA 6.0 (INSTRUMENTS) ×1 IMPLANT
SET HNDPC FAN SPRY TIP SCT (DISPOSABLE) ×1 IMPLANT
SHELL ACETAB MULTI HOLE 52MM (Shell) IMPLANT
SHELL ACETAB MULTIHOLE 56MM S7 (Hips) IMPLANT
SOLUTION PRONTOSAN WOUND 350ML (IRRIGATION / IRRIGATOR) ×1 IMPLANT
SPIKE FLUID TRANSFER (MISCELLANEOUS) ×1 IMPLANT
STAPLER SKIN PROX 35W (STAPLE) IMPLANT
SUT MNCRL AB 3-0 PS2 18 (SUTURE) ×1 IMPLANT
SUT MON AB 2-0 CT1 36 (SUTURE) ×1 IMPLANT
SUT STRATAFIX 14 PDO 48 VLT (SUTURE) ×1 IMPLANT
SUT STRATAFIX PDO 1 14 VIOLET (SUTURE) ×1 IMPLANT
SUT VIC AB 2-0 CT1 TAPERPNT 27 (SUTURE) IMPLANT
SYR 3ML LL SCALE MARK (SYRINGE) ×1 IMPLANT
TOWEL GREEN STERILE FF (TOWEL DISPOSABLE) ×1 IMPLANT
TRAY FOLEY W/BAG SLVR 14FR LF (SET/KITS/TRAYS/PACK) IMPLANT
TUBE SUCTION HIGH CAP CLEAR NV (SUCTIONS) ×1 IMPLANT
WATER STERILE IRR 1000ML POUR (IV SOLUTION) ×1 IMPLANT

## 2023-09-06 NOTE — Discharge Instructions (Addendum)
 Dr. Samson Frederic Joint Replacement Specialist [facility] [address] [place], [place] [address] [phone]   TOTAL HIP REPLACEMENT POSTOPERATIVE DIRECTIONS    Hip Rehabilitation, Guidelines Following Surgery   WEIGHT BEARING Other:  Touchdown weightbearing with walker on right lower extremity.   The results of a hip operation are greatly improved after range of motion and muscle strengthening exercises. Follow all safety measures which are given to protect your hip. If any of these exercises cause increased pain or swelling in your joint, decrease the amount until you are comfortable again. Then slowly increase the exercises. Call your caregiver if you have problems or questions.   HOME CARE INSTRUCTIONS  Most of the following instructions are designed to prevent the dislocation of your new hip.  Remove items at home which could result in a fall. This includes throw rugs or furniture in walking pathways.  Continue medications as instructed at time of discharge. You may have some home medications which will be placed on hold until you complete the course of blood thinner medication. You may start showering once you are discharged home. Do not remove your dressing. Do not put on socks or shoes without following the instructions of your caregivers.   Sit on chairs with arms. Use the chair arms to help push yourself up when arising.  Arrange for the use of a toilet seat elevator so you are not sitting low.  Touchdown weightbearing with walker as instructed.   You may resume a sexual relationship in one month or when given the OK by your caregiver.  Use walker as long as suggested by your caregivers.  Avoid periods of inactivity such as sitting longer than an hour when not asleep. This helps prevent blood clots.  You may return to work once you are cleared by Designer, industrial/product.  Do not drive a car for 6 weeks or until released by your surgeon.  Do not drive  while taking narcotics.  Wear elastic stockings for two weeks following surgery during the day but you may remove then at night.  Make sure you keep all of your appointments after your operation with all of your doctors and caregivers. You should call the office at the above phone number and make an appointment for approximately two weeks after the date of your surgery. Please pick up a stool softener and laxative for home use as long as you are requiring pain medications. ICE to the affected hip every three hours for 30 minutes at a time and then as needed for pain and swelling. Continue to use ice on the hip for pain and swelling from surgery. You may notice swelling that will progress down to the foot and ankle.  This is normal after surgery.  Elevate the leg when you are not up walking on it.   It is important for you to complete the blood thinner medication as prescribed by your doctor. Continue to use the breathing machine which will help keep your temperature down.  It is common for your temperature to cycle up and down following surgery, especially at night when you are not up moving around and exerting yourself.  The breathing machine keeps your lungs expanded and your temperature down.  RANGE OF MOTION AND STRENGTHENING EXERCISES  These exercises are designed to help you keep full movement of your hip joint. Follow your caregiver's or physical therapist's instructions. Perform all exercises about fifteen times, three times per day or as directed. Exercise both hips,  even if you have had only one joint replacement. These exercises can be done on a training (exercise) mat, on the floor, on a table or on a bed. Use whatever works the best and is most comfortable for you. Use music or television while you are exercising so that the exercises are a pleasant break in your day. This will make your life better with the exercises acting as a break in routine you can look forward to.  Lying on your back,  slowly slide your foot toward your buttocks, raising your knee up off the floor. Then slowly slide your foot back down until your leg is straight again.  Lying on your back spread your legs as far apart as you can without causing discomfort.  Lying on your side, raise your upper leg and foot straight up from the floor as far as is comfortable. Slowly lower the leg and repeat.  Lying on your back, tighten up the muscle in the front of your thigh (quadriceps muscles). You can do this by keeping your leg straight and trying to raise your heel off the floor. This helps strengthen the largest muscle supporting your knee.  Lying on your back, tighten up the muscles of your buttocks both with the legs straight and with the knee bent at a comfortable angle while keeping your heel on the floor.   SKILLED REHAB INSTRUCTIONS: If the patient is transferred to a skilled rehab facility following release from the hospital, a list of the current medications will be sent to the facility for the patient to continue.  When discharged from the skilled rehab facility, please have the facility set up the patient's Home Health Physical Therapy prior to being released. Also, the skilled facility will be responsible for providing the patient with their medications at time of release from the facility to include their pain medication and their blood thinner medication. If the patient is still at the rehab facility at time of the two week follow up appointment, the skilled rehab facility will also need to assist the patient in arranging follow up appointment in our office and any transportation needs.  POST-OPERATIVE OPIOID TAPER INSTRUCTIONS: It is important to wean off of your opioid medication as soon as possible. If you do not need pain medication after your surgery it is ok to stop day one. Opioids include: Codeine, Hydrocodone(Norco, Vicodin), Oxycodone(Percocet, oxycontin) and hydromorphone amongst others.  Long term and  even short term use of opiods can cause: Increased pain response Dependence Constipation Depression Respiratory depression And more.  Withdrawal symptoms can include Flu like symptoms Nausea, vomiting And more Techniques to manage these symptoms Hydrate well Eat regular healthy meals Stay active Use relaxation techniques(deep breathing, meditating, yoga) Do Not substitute Alcohol to help with tapering If you have been on opioids for less than two weeks and do not have pain than it is ok to stop all together.  Plan to wean off of opioids This plan should start within one week post op of your joint replacement. Maintain the same interval or time between taking each dose and first decrease the dose.  Cut the total daily intake of opioids by one tablet each day Next start to increase the time between doses. The last dose that should be eliminated is the evening dose.    MAKE SURE YOU:  Understand these instructions.  Will watch your condition.  Will get help right away if you are not doing well or get worse.  Pick up stool  softner and laxative for home use following surgery while on pain medications. Do not remove your dressing. The dressing is waterproof--it is OK to take showers. Continue to use ice for pain and swelling after surgery. Do not use any lotions or creams on the incision until instructed by your surgeon. Total Hip Protocol.   Carbohydrate Counting For People With Diabetes  Foods with carbohydrates make your blood glucose level go up. Learning how to count carbohydrates can help you control your blood glucose levels. First, identify the foods you eat that contain carbohydrates. Then, using the Foods with Carbohydrates chart, determine about how much carbohydrates are in your meals and snacks. Make sure you are eating foods with fiber, protein, and healthy fat along with your carbohydrate foods. Foods with Carbohydrates The following table shows carbohydrate foods  that have about 15 grams of carbohydrate each. Using measuring cups, spoons, or a food scale when you first begin learning about carbohydrate counting can help you learn about the portion sizes you typically eat. The following foods have 15 grams carbohydrate each:  Grains 1 slice bread (1 ounce)  1 small tortilla (6-inch size)   large bagel (1 ounce)  1/3 cup pasta or rice (cooked)   hamburger or hot dog bun ( ounce)   cup cooked cereal   to  cup ready-to-eat cereal  2 taco shells (5-inch size) Fruit 1 small fresh fruit ( to 1 cup)   medium banana  17 small grapes (3 ounces)  1 cup melon or berries   cup canned or frozen fruit  2 tablespoons dried fruit (blueberries, cherries, cranberries, raisins)   cup unsweetened fruit juice  Starchy Vegetables  cup cooked beans, peas, corn, potatoes/sweet potatoes   large baked potato (3 ounces)  1 cup acorn or butternut squash  Snack Foods 3 to 6 crackers  8 potato chips or 13 tortilla chips ( ounce to 1 ounce)  3 cups popped popcorn  Dairy 3/4 cup (6 ounces) nonfat plain yogurt, or yogurt with sugar-free sweetener  1 cup milk  1 cup plain rice, soy, coconut or flavored almond milk Sweets and Desserts  cup ice cream or frozen yogurt  1 tablespoon jam, jelly, pancake syrup, table sugar, or honey  2 tablespoons light pancake syrup  1 inch square of frosted cake or 2 inch square of unfrosted cake  2 small cookies (2/3 ounce each) or  large cookie  Sometimes you'll have to estimate carbohydrate amounts if you don't know the exact recipe. One cup of mixed foods like soups can have 1 to 2 carbohydrate servings, while some casseroles might have 2 or more servings of carbohydrate. Foods that have less than 20 calories in each serving can be counted as "free" foods. Count 1 cup raw vegetables, or  cup cooked non-starchy vegetables as "free" foods. If you eat 3 or more servings at one meal, then count them as 1 carbohydrate serving.   Foods without Carbohydrates  Not all foods contain carbohydrates. Meat, some dairy, fats, non-starchy vegetables, and many beverages don't contain carbohydrate. So when you count carbohydrates, you can generally exclude chicken, pork, beef, fish, seafood, eggs, tofu, cheese, butter, sour cream, avocado, nuts, seeds, olives, mayonnaise, water, black coffee, unsweetened tea, and zero-calorie drinks. Vegetables with no or low carbohydrate include green beans, cauliflower, tomatoes, and onions. How much carbohydrate should I eat at each meal?  Carbohydrate counting can help you plan your meals and manage your weight. Following are some starting points for carbohydrate intake  at each meal. Work with your registered dietitian nutritionist to find the best range that works for your blood glucose and weight.   To Lose Weight To Maintain Weight  Women 2 - 3 carb servings 3 - 4 carb servings  Men 3 - 4 carb servings 4 - 5 carb servings  Checking your blood glucose after meals will help you know if you need to adjust the timing, type, or number of carbohydrate servings in your meal plan. Achieve and keep a healthy body weight by balancing your food intake and physical activity.  Tips How should I plan my meals?  Plan for half the food on your plate to include non-starchy vegetables, like salad greens, broccoli, or carrots. Try to eat 3 to 5 servings of non-starchy vegetables every day. Have a protein food at each meal. Protein foods include chicken, fish, meat, eggs, or beans (note that beans contain carbohydrate). These two food groups (non-starchy vegetables and proteins) are low in carbohydrate. If you fill up your plate with these foods, you will eat less carbohydrate but still fill up your stomach. Try to limit your carbohydrate portion to  of the plate.  What fats are healthiest to eat?  Diabetes increases risk for heart disease. To help protect your heart, eat more healthy fats, such as olive oil, nuts,  and avocado. Eat less saturated fats like butter, cream, and high-fat meats, like bacon and sausage. Avoid trans fats, which are in all foods that list "partially hydrogenated oil" as an ingredient. What should I drink?  Choose drinks that are not sweetened with sugar. The healthiest choices are water, carbonated or seltzer waters, and tea and coffee without added sugars.  Sweet drinks will make your blood glucose go up very quickly. One serving of soda or energy drink is  cup. It is best to drink these beverages only if your blood glucose is low.  Artificially sweetened, or diet drinks, typically do not increase your blood glucose if they have zero calories in them. Read labels of beverages, as some diet drinks do have carbohydrate and will raise your blood glucose. Label Reading Tips Read Nutrition Facts labels to find out how many grams of carbohydrate are in a food you want to eat. Don't forget: sometimes serving sizes on the label aren't the same as how much food you are going to eat, so you may need to calculate how much carbohydrate is in the food you are serving yourself.   Carbohydrate Counting for People with Diabetes Sample 1-Day Menu  Breakfast  cup yogurt, low fat, low sugar (1 carbohydrate serving)   cup cereal, ready-to-eat, unsweetened (1 carbohydrate serving)  1 cup strawberries (1 carbohydrate serving)   cup almonds ( carbohydrate serving)  Lunch 1, 5 ounce can chunk light tuna  2 ounces cheese, low fat cheddar  6 whole wheat crackers (1 carbohydrate serving)  1 small apple (1 carbohydrate servings)   cup carrots ( carbohydrate serving)   cup snap peas  1 cup 1% milk (1 carbohydrate serving)   Evening Meal Stir fry made with: 3 ounces chicken  1 cup brown rice (3 carbohydrate servings)   cup broccoli ( carbohydrate serving)   cup green beans   cup onions  1 tablespoon olive oil  2 tablespoons teriyaki sauce ( carbohydrate serving)  Evening Snack 1 extra  small banana (1 carbohydrate serving)  1 tablespoon peanut butter   Carbohydrate Counting for People with Diabetes Vegan Sample 1-Day Menu  Breakfast 1 cup  cooked oatmeal (2 carbohydrate servings)   cup blueberries (1 carbohydrate serving)  2 tablespoons flaxseeds  1 cup soymilk fortified with calcium and vitamin D  1 cup coffee  Lunch 2 slices whole wheat bread (2 carbohydrate servings)   cup baked tofu   cup lettuce  2 slices tomato  2 slices avocado   cup baby carrots ( carbohydrate serving)  1 orange (1 carbohydrate serving)  1 cup soymilk fortified with calcium and vitamin D   Evening Meal Burrito made with: 1 6-inch corn tortilla (1 carbohydrate serving)  1 cup refried vegetarian beans (2 carbohydrate servings)   cup chopped tomatoes   cup lettuce   cup salsa  1/3 cup brown rice (1 carbohydrate serving)  1 tablespoon olive oil for rice   cup zucchini   Evening Snack 6 small whole grain crackers (1 carbohydrate serving)  2 apricots ( carbohydrate serving)   cup unsalted peanuts ( carbohydrate serving)    Carbohydrate Counting for People with Diabetes Vegetarian (Lacto-Ovo) Sample 1-Day Menu  Breakfast 1 cup cooked oatmeal (2 carbohydrate servings)   cup blueberries (1 carbohydrate serving)  2 tablespoons flaxseeds  1 egg  1 cup 1% milk (1 carbohydrate serving)  1 cup coffee  Lunch 2 slices whole wheat bread (2 carbohydrate servings)  2 ounces low-fat cheese   cup lettuce  2 slices tomato  2 slices avocado   cup baby carrots ( carbohydrate serving)  1 orange (1 carbohydrate serving)  1 cup unsweetened tea  Evening Meal Burrito made with: 1 6-inch corn tortilla (1 carbohydrate serving)   cup refried vegetarian beans (1 carbohydrate serving)   cup tomatoes   cup lettuce   cup salsa  1/3 cup brown rice (1 carbohydrate serving)  1 tablespoon olive oil for rice   cup zucchini  1 cup 1% milk (1 carbohydrate serving)  Evening Snack 6 small whole  grain crackers (1 carbohydrate serving)  2 apricots ( carbohydrate serving)   cup unsalted peanuts ( carbohydrate serving)    Copyright 2020  Academy of [employer] and Dietetics. All rights reserved.  Using Nutrition Labels: Carbohydrate  Serving Size  Look at the serving size. All the information on the label is based on this portion. Servings Per Container  The number of servings contained in the package. Guidelines for Carbohydrate  Look at the total grams of carbohydrate in the serving size.  1 carbohydrate choice = 15 grams of carbohydrate. Range of Carbohydrate Grams Per Choice  Carbohydrate Grams/Choice Carbohydrate Choices  6-10   11-20 1  21-25 1  26-35 2  36-40 2  41-50 3  51-55 3  56-65 4  66-70 4  71-80 5    Copyright 2020  Academy of [employer] and Dietetics. All rights reserved.

## 2023-09-06 NOTE — Interval H&P Note (Signed)
 History and Physical Interval Note:  09/06/2023 4:11 PM  Ariel Cobb  has presented today for surgery, with the diagnosis of Right hip avascular necrosis.  The various methods of treatment have been discussed with the patient and family. After consideration of risks, benefits and other options for treatment, the patient has consented to  Procedure(s): ARTHROPLASTY, HIP, TOTAL, ANTERIOR APPROACH (Right) as a surgical intervention.  The patient's history has been reviewed, patient examined, no change in status, stable for surgery.  I have reviewed the patient's chart and labs.  Questions were answered to the patient's satisfaction.     Iline Oven Audryanna Zurita

## 2023-09-06 NOTE — Care Plan (Signed)
 Ortho Bundle Case Management Note  Patient Details  Name: Ariel Cobb MRN: [id number] Date of Birth: 25-May-1946  R THA on 09/06/23  DCP: Home with sister Steward Drone DME: No needs. Has RW.  PT: HEP                   DME Arranged:  N/A DME Agency:  NA  HH Arranged:    HH Agency:     Additional Comments: Please contact me with any questions of if this plan should need to change.  Despina Pole, [place] [facility]  [phone]   09/06/2023, 12:24 PM

## 2023-09-06 NOTE — Anesthesia Postprocedure Evaluation (Signed)
 Anesthesia Post Note  Patient: Ariel Cobb  Procedure(s) Performed: ARTHROPLASTY, HIP, TOTAL, ANTERIOR APPROACH (Right: Hip)     Patient location during evaluation: Nursing Unit Anesthesia Type: Spinal Level of consciousness: oriented and awake and alert Pain management: pain level controlled Vital Signs Assessment: post-procedure vital signs reviewed and stable Respiratory status: spontaneous breathing and respiratory function stable Cardiovascular status: blood pressure returned to baseline and stable Postop Assessment: no headache, no backache, no apparent nausea or vomiting and patient able to bend at knees Anesthetic complications: no  No notable events documented.  Last Vitals:  Vitals:   09/06/23 1233  BP: (!) 187/70  Pulse: (!) 57  Resp: 16  Temp: 36.6 C  SpO2: 100%    Last Pain:  Vitals:   09/06/23 2046  TempSrc:   PainSc: 8                  Trevor Iha

## 2023-09-06 NOTE — Op Note (Signed)
 OPERATIVE REPORT  SURGEON: Samson Frederic, MD   ASSISTANT: Clint Bolder, PA-C.  PREOPERATIVE DIAGNOSIS: Right hip avascular necrosis with collapse.   POSTOPERATIVE DIAGNOSIS:  Right hip avascular necrosis with collapse.  PROCEDURE: Conversion of previous surgery to Right total hip arthroplasty, anterior approach.   IMPLANTS: Biomet Arcos One Piece stem, size 16 x 210 mm, high offset. Biomet G7 OsseoTi Multihole Cup, size 56 mm. Biomet Vivacit-E liner, size 36 mm, F, +5 mm neutral. Biomet metal head ball, size 36 + 3 mm. 6.5 mm cancellous bone screw x 5.  ANESTHESIA:  MAC and Spinal  ESTIMATED BLOOD LOSS:-400 mL    ANTIBIOTICS: 2g Ancef.  DRAINS: None.  COMPLICATIONS: Perforation of femoral cortex with reamer.   CONDITION: PACU - hemodynamically stable.   BRIEF CLINICAL NOTE: Ariel Cobb is a 78 y.o. female with a long-standing history of Right hip avascular necrosis following remote treatment of intertrochanteric fracture with intramedullary nail.  I previously took her to the operating room for removal of her hardware with the plan to stage her total hip replacement, which is the plan for today.  After failing conservative management, the patient was indicated for total hip arthroplasty. The risks, benefits, and alternatives to the procedure were explained, and the patient elected to proceed.  PROCEDURE IN DETAIL: Surgical site was marked by myself in the pre-op holding area. Once inside the operating room, spinal anesthesia was obtained, and a foley catheter was inserted. The patient was then positioned on the Hana table.  All bony prominences were well padded.  The hip was prepped and draped in the normal sterile surgical fashion.  A time-out was called verifying side and site of surgery. The patient received IV antibiotics within 60 minutes of beginning the procedure.   Bikini incision was made, and superficial dissection was performed lateral to the ASIS. The direct  anterior approach to the hip was performed through the Hueter interval.  Lateral femoral circumflex vessels were treated with the Auqumantys. The anterior capsule was exposed and an inverted T capsulotomy was made.  Patient had obvious malunion to the proximal femur with significant heterotopic ossification and bony overgrowth.  The femoral neck cut was made to the level of the templated cut.  A corkscrew was placed into the head and the head was removed.  The femoral head was found to be collapsed and have eburnated bone. The head was passed to the back table and was measured. Pubofemoral ligament was released off of the calcar, taking care to stay on bone. Superior capsule was released from the greater trochanter, taking care to stay lateral to the posterior border of the femoral neck in order to preserve the short external rotators.   Acetabular exposure was achieved, and the pulvinar and labrum were excised.  There was a pseudoacetabulum at the 12 o'clock position.  I sharply excised inferior scar tissue.  Sequential reaming of the acetabulum was then performed up to a size 55 mm reamer.  Reaming was performed under fluoroscopic guidance, taking care to restore the anatomic hip center.  Retroacetabular cancellous bone quality was extremely poor.  30 cc of allograft cancellous bone chips were placed into the acetabulum and impacted with a 54 mm reamer on reverse.  A 56 mm cup was then opened and impacted into place at approximately 40 degrees of abduction and 20 degrees of anteversion.  Due to her poor bone quality, I elected to augment the already acceptable press-fit fixation with a total of five 6.5 mm cancellous  bone screws.  The final polyethylene liner was impacted into place and acetabular osteophytes were removed.    I then gained femoral exposure taking care to protect the abductors and greater trochanter.  This was performed using standard external rotation, extension, and adduction.  A cookie  cutter was used to enter the femoral canal, and then the femoral canal finder was placed.  I initially broached the femur for a flat taper wedge stem.  Due to her malunion, it was obvious that adequate bony purchase was not achievable.  Therefore, I decided to proceed with a distal fixation stem.  I reamed up to a 10 mm reamer, and I checked the location on biplanar fluoroscopy.  I identified cortical perforation on the lateral view anteriorly, just proximal to the site of the previous interlocking screw.  I then selected a smaller reamer and reamed for a 210 mm length stem.  Each reamer was placed and checked with biplanar fluoroscopy.  I reamed up to a 16 mm reamer with acceptable chatter.  Once appropriate position was confirmed on biplanar fluoroscopy, I then broached up to a 16 mm broach.  The real femoral component was placed and checked with biplanar fluoroscopy.  The site of perforation was bypassed by greater than 4 cortices.  I placed a different length trial head balls, and leg length was sequentially checked with AP pelvis radiographs.  The +3 head ball gave excellent stability and restored leg length to an acceptable value.  The trunnion was cleaned, and the real head ball was placed and impacted onto the trunnion.  The hip was reduced.  Leg lengths and offset were checked fluoroscopically.  Fluoroscopy was used to confirm component position and leg lengths.  At 90 degrees of external rotation and full extension, the hip was stable to an anterior directed force.  The boot was then loosened from the Hana table and I performed posterior stability testing at 90 degrees of hip flexion.  The hip was stable without any impingement or subluxation.   The wound was copiously irrigated with Prontosan solution and normal saline using pule lavage.  1 g vancomycin powder was placed into the hip joint.  Marcaine solution was injected into the periarticular soft tissue.  The wound was closed in layers using #1  Vicryl and V-Loc for the fascia, 2-0 Vicryl for the subcutaneous fat, 2-0 Monocryl for the deep dermal layer, and staples plus Dermabond for the skin.  Once the glue was fully dried, an Aquacell Ag dressing was applied.  The patient was transported to the recovery room in stable condition.  Sponge, needle, and instrument counts were correct at the end of the case x2.  The patient tolerated the procedure well and there were no known complications.  Please note that a surgical assistant was a medical necessity for this procedure to perform it in a safe and expeditious manner. Assistant was necessary to provide appropriate retraction of vital neurovascular structures, to prevent femoral fracture, and to allow for anatomic placement of the prosthesis.

## 2023-09-06 NOTE — Transfer of Care (Signed)
 Immediate Anesthesia Transfer of Care Note  Patient: Ariel Cobb  Procedure(s) Performed: ARTHROPLASTY, HIP, TOTAL, ANTERIOR APPROACH (Right: Hip)  Patient Location: PACU  Anesthesia Type:Spinal  Level of Consciousness: drowsy and patient cooperative  Airway & Oxygen Therapy: Patient Spontanous Breathing and Patient connected to face mask oxygen  Post-op Assessment: Report given to RN and Post -op Vital signs reviewed and stable  Post vital signs: Reviewed and stable  Last Vitals:  Vitals Value Taken Time  BP 112/54 09/06/23 2030  Temp    Pulse 53 09/06/23 2034  Resp 18 09/06/23 2034  SpO2 98 % 09/06/23 2034  Vitals shown include unfiled device data.  Last Pain:  Vitals:   09/06/23 1337  TempSrc:   PainSc: 0-No pain         Complications: No notable events documented.

## 2023-09-06 NOTE — Anesthesia Procedure Notes (Signed)
 Spinal  Patient location during procedure: OR Start time: 09/06/2023 4:22 PM End time: 09/06/2023 4:30 PM Reason for block: surgical anesthesia Staffing Performed: anesthesiologist  Anesthesiologist: Trevor Iha, MD Performed by: Trevor Iha, MD Authorized by: Trevor Iha, MD   Preanesthetic Checklist Completed: patient identified, IV checked, risks and benefits discussed, surgical consent, monitors and equipment checked, pre-op evaluation and timeout performed Spinal Block Patient position: sitting Prep: DuraPrep and site prepped and draped Patient monitoring: heart rate, cardiac monitor, continuous pulse ox and blood pressure Approach: midline Location: L3-4 Injection technique: single-shot Needle Needle type: Pencan  Needle gauge: 24 G Needle length: 10 cm Needle insertion depth: 5 cm Assessment Sensory level: T4 Events: CSF return Additional Notes  1 Attempt (s). Pt tolerated procedure well.

## 2023-09-07 ENCOUNTER — Other Ambulatory Visit: Payer: Self-pay

## 2023-09-07 ENCOUNTER — Encounter (HOSPITAL_COMMUNITY): Payer: Self-pay | Admitting: Orthopedic Surgery

## 2023-09-07 LAB — BASIC METABOLIC PANEL
Anion gap: 10 (ref 5–15)
BUN: 32 mg/dL — ABNORMAL HIGH (ref 8–23)
CO2: 23 mmol/L (ref 22–32)
Calcium: 8.1 mg/dL — ABNORMAL LOW (ref 8.9–10.3)
Chloride: 103 mmol/L (ref 98–111)
Creatinine, Ser: 1.36 mg/dL — ABNORMAL HIGH (ref 0.44–1.00)
GFR, Estimated: 40 mL/min — ABNORMAL LOW (ref >60)
Glucose, Bld: 186 mg/dL — ABNORMAL HIGH (ref 70–99)
Potassium: 4.8 mmol/L (ref 3.5–5.1)
Sodium: 136 mmol/L (ref 135–145)

## 2023-09-07 LAB — CBC
HCT: 25.7 % — ABNORMAL LOW (ref 36.0–46.0)
Hemoglobin: 7.9 g/dL — ABNORMAL LOW (ref 12.0–15.0)
MCH: 32.6 pg (ref 26.0–34.0)
MCHC: 30.7 g/dL (ref 30.0–36.0)
MCV: 106.2 fL — ABNORMAL HIGH (ref 80.0–100.0)
Platelets: 274 10*3/uL (ref 150–400)
RBC: 2.42 MIL/uL — ABNORMAL LOW (ref 3.87–5.11)
RDW: 14.4 % (ref 11.5–15.5)
WBC: 25.5 10*3/uL — ABNORMAL HIGH (ref 4.0–10.5)
nRBC: 0 % (ref 0.0–0.2)

## 2023-09-07 LAB — GLUCOSE, CAPILLARY
Glucose-Capillary: 238 mg/dL — ABNORMAL HIGH (ref 70–99)
Glucose-Capillary: 285 mg/dL — ABNORMAL HIGH (ref 70–99)
Glucose-Capillary: 161 mg/dL — ABNORMAL HIGH (ref 70–99)
Glucose-Capillary: 200 mg/dL — ABNORMAL HIGH (ref 70–99)

## 2023-09-07 MED ORDER — ACETAMINOPHEN 500 MG PO TABS
500.0000 mg | ORAL_TABLET | Freq: Four times a day (QID) | ORAL | Status: AC
  Administered 2023-09-07 (×4): 500 mg via ORAL
  Filled 2023-09-07 (×4): qty 1

## 2023-09-07 MED ORDER — INSULIN ASPART 100 UNIT/ML IJ SOLN
0.0000 [IU] | Freq: Three times a day (TID) | INTRAMUSCULAR | Status: DC
  Administered 2023-09-07: 2 [IU] via SUBCUTANEOUS
  Administered 2023-09-07: 3 [IU] via SUBCUTANEOUS
  Administered 2023-09-07: 5 [IU] via SUBCUTANEOUS
  Administered 2023-09-08: 1 [IU] via SUBCUTANEOUS
  Administered 2023-09-08: 3 [IU] via SUBCUTANEOUS
  Administered 2023-09-08: 5 [IU] via SUBCUTANEOUS
  Administered 2023-09-09: 1 [IU] via SUBCUTANEOUS
  Administered 2023-09-09: 2 [IU] via SUBCUTANEOUS
  Administered 2023-09-09: 5 [IU] via SUBCUTANEOUS
  Administered 2023-09-10: 2 [IU] via SUBCUTANEOUS
  Administered 2023-09-10: 3 [IU] via SUBCUTANEOUS
  Administered 2023-09-10: 7 [IU] via SUBCUTANEOUS
  Administered 2023-09-11: 9 [IU] via SUBCUTANEOUS
  Administered 2023-09-11: 5 [IU] via SUBCUTANEOUS
  Administered 2023-09-11: 2 [IU] via SUBCUTANEOUS
  Administered 2023-09-12 (×2): 5 [IU] via SUBCUTANEOUS
  Administered 2023-09-12: 7 [IU] via SUBCUTANEOUS
  Administered 2023-09-13: 3 [IU] via SUBCUTANEOUS
  Administered 2023-09-13: 2 [IU] via SUBCUTANEOUS
  Administered 2023-09-13: 9 [IU] via SUBCUTANEOUS
  Administered 2023-09-14: 5 [IU] via SUBCUTANEOUS
  Administered 2023-09-14: 2 [IU] via SUBCUTANEOUS
  Administered 2023-09-14: 9 [IU] via SUBCUTANEOUS
  Administered 2023-09-15: 3 [IU] via SUBCUTANEOUS
  Administered 2023-09-15 – 2023-09-16 (×2): 5 [IU] via SUBCUTANEOUS
  Administered 2023-09-16: 1 [IU] via SUBCUTANEOUS
  Administered 2023-09-16: 5 [IU] via SUBCUTANEOUS
  Administered 2023-09-17 (×2): 2 [IU] via SUBCUTANEOUS
  Administered 2023-09-18: 3 [IU] via SUBCUTANEOUS

## 2023-09-07 MED ORDER — ADULT MULTIVITAMIN W/MINERALS CH
1.0000 | ORAL_TABLET | Freq: Every day | ORAL | Status: DC
  Administered 2023-09-07 – 2023-09-18 (×12): 1 via ORAL
  Filled 2023-09-07 (×12): qty 1

## 2023-09-07 MED ORDER — MUPIROCIN 2 % EX OINT
TOPICAL_OINTMENT | Freq: Two times a day (BID) | CUTANEOUS | Status: AC
  Administered 2023-09-08 – 2023-09-11 (×4): 1 via NASAL
  Filled 2023-09-07: qty 22

## 2023-09-07 MED ORDER — INSULIN ASPART 100 UNIT/ML IJ SOLN: 0.0000 [IU] | Freq: Every day | INTRAMUSCULAR | Status: DC

## 2023-09-07 NOTE — Care Management Obs Status (Signed)
 MEDICARE OBSERVATION STATUS NOTIFICATION   Patient Details  Name: Ariel Cobb MRN: [id number] Date of Birth: 10/29/1945   Medicare Observation Status Notification Given:  Yes    Howell Rucks, RN 09/07/2023, 9:20 AM

## 2023-09-07 NOTE — Progress Notes (Signed)
    Subjective:  Patient reports pain as moderate.  Denies V/CP/Abd pain this morning. She reports intermittent SOB per baseline. She also reports waves of nausea.  She denies any tingling or numbness in LE bilaterally.  She reports some heel pain, elevated heels off bed this morning.  We discussed TDWB status this morning, she understands.  We discussed she might have to stay another night. Patient would like to go home if possible. Discussed parameters to going home.   Objective:   VITALS:   Vitals:   09/06/23 2238 09/07/23 0038 09/07/23 0330 09/07/23 0537  BP: (!) 157/50 (!) 143/46  (!) 134/44  Pulse: 64 60  (!) 57  Resp: 18 17  17   Temp: (!) 97.4 F (36.3 C) (!) 97.4 F (36.3 C)  97.8 F (36.6 C)  TempSrc: Oral Oral  Oral  SpO2: 100% 99%  100%  Weight:   50.2 kg   Height:   5\' 3"  (1.6 m)     NAD. Sitting up in bed.  Neurologically intact ABD soft Neurovascular intact Sensation intact distally Intact pulses distally Dorsiflexion/Plantar flexion intact Incision: dressing C/D/I No cellulitis present Compartment soft Elevated heels off bed this morning.   Lab Results  Component Value Date   WBC 25.5 (H) 09/07/2023   HGB 7.9 (L) 09/07/2023   HCT 25.7 (L) 09/07/2023   MCV 106.2 (H) 09/07/2023   PLT 274 09/07/2023   BMET    Component Value Date/Time   NA 136 09/07/2023 0330   NA 142 01/30/2023 1001   K 4.8 09/07/2023 0330   CL 103 09/07/2023 0330   CO2 23 09/07/2023 0330   GLUCOSE 186 (H) 09/07/2023 0330   BUN 32 (H) 09/07/2023 0330   BUN 31 (H) 01/30/2023 1001   CREATININE 1.36 (H) 09/07/2023 0330   CREATININE 1.42 (H) 07/24/2013 1236   CALCIUM 8.1 (L) 09/07/2023 0330   EGFR 42 (L) 01/30/2023 1001   GFRNONAA 40 (L) 09/07/2023 0330     Assessment/Plan: 1 Day Post-Op   Principal Problem:   S/P total right hip arthroplasty Active Problems:   Avascular necrosis of hip, right (HCC)  ABLA. Hemoglobin 7.9 (11.7 09/01/23). Continue to monitor.  CKD  history. Creatinine 1.36 today (1.68 09/01/23). Continue to monitor.   Touchdown weightbearing RLE with walker DVT ppx:  Eliquis , SCDs, TEDS PO pain control PT/OT: to come today.  Dispo:  - Patient has baseline orthostatic hypotension and dizziness. We will see how she does with PT, to come today.  - Nutrition consult placed, pending their recommendations.     Clois Dupes 09/07/2023, 6:32 AM   [facility]  [place] [address], [address], [place], [place] [address] Phone: [phone] [email or web address] Facebook  [employer]

## 2023-09-07 NOTE — Progress Notes (Signed)
 Physical Therapy Treatment Patient Details Name: Ariel Cobb MRN: [id number] DOB: July 28, 1945 Today's Date: 09/07/2023   History of Present Illness 78 yo female  s/p Conversion of previous surgery to Right total hip arthroplasty, anterior approach.     PMH: arthritis, HTN, HLD, PAD,  R hip fx-s/p IM nail 2023, s/p removal of hardware 06/13/24, AVN R hip    PT Comments  Pt progressing steadily this session, improved ability to adhere to TDWB however does fatigue easily which limits amb. Will continue to work with PT in acute setting to allow for safe d/c. Pt has ramp to enter home, may need w/c or transport chair d/t TDWB status.  Pt sister will be with pt at home however cannot physically assist.    If plan is discharge home, recommend the following: A little help with walking and/or transfers;A little help with bathing/dressing/bathroom;Assistance with cooking/housework;Help with stairs or ramp for entrance;Assist for transportation   Can travel by private Theme park manager (measurements PT)    Recommendations for Other Services       Precautions / Restrictions Precautions Precautions: Fall Recall of Precautions/Restrictions: Intact Restrictions RLE Weight Bearing Per Provider Order: Touchdown weight bearing     Mobility  Bed Mobility Overal bed mobility: Needs Assistance Bed Mobility: Sit to Supine     Supine to sit: Min assist Sit to supine: Min assist   General bed mobility comments: assist with RLE, incr time needed    Transfers Overall transfer level: Needs assistance Equipment used: Rolling walker (2 wheels) Transfers: Sit to/from Stand, Bed to chair/wheelchair/BSC Sit to Stand: Min assist, +2 safety/equipment   Step pivot transfers: Min assist, +2 safety/equipment       General transfer comment: cues for RLE position and hand placement; assist to rise and transition to RW; for stand pivot bed to [facility] assist to steady and  maneuver RW; cues for TDWB, trunk extension; improved  ability  to adhere to  TDWB    Ambulation/Gait               General Gait Details: a few steps to [facility], ~ 3 steps back to bed, adheres to TDWB with cues   Stairs             Wheelchair Mobility     Tilt Bed    Modified Rankin (Stroke Patients Only)       Balance Overall balance assessment: Needs assistance Sitting-balance support: Feet supported, No upper extremity supported Sitting balance-Leahy Scale: Fair Sitting balance - Comments: wt shifting limited by pain   Standing balance support: Reliant on assistive device for balance, During functional activity Standing balance-Leahy Scale: Poor                              Communication Communication Communication: No apparent difficulties  Cognition Arousal: Alert Behavior During Therapy: WFL for tasks assessed/performed   PT - Cognitive impairments: No apparent impairments                         Following commands: Intact      Cueing Cueing Techniques: Verbal cues  Exercises Total Joint Exercises Ankle Circles/Pumps: AROM, Both, 10 reps    General Comments        Pertinent Vitals/Pain Pain Assessment Pain Assessment: Faces Faces Pain Scale: Hurts little more Pain Location: R hip Pain Descriptors / Indicators:  Sore, Guarding Pain Intervention(s): Limited activity within patient's tolerance, Monitored during session, Premedicated before session, Repositioned    Home Living                          Prior Function            PT Goals (current goals can now be found in the care plan section) Acute Rehab PT Goals Patient Stated Goal: home! PT Goal Formulation: With patient/family Time For Goal Achievement: 09/21/23 Potential to Achieve Goals: Good Progress towards PT goals: Progressing toward goals    Frequency    7X/week      PT Plan      Co-evaluation              AM-PAC PT "6  Clicks" Mobility   Outcome Measure  Help needed turning from your back to your side while in a flat bed without using bedrails?: A Little Help needed moving from lying on your back to sitting on the side of a flat bed without using bedrails?: A Little Help needed moving to and from a bed to a chair (including a wheelchair)?: A Little Help needed standing up from a chair using your arms (e.g., wheelchair or bedside chair)?: A Little Help needed to walk in hospital room?: A Lot Help needed climbing 3-5 steps with a railing? : A Lot 6 Click Score: 16    End of Session Equipment Utilized During Treatment: Gait belt Activity Tolerance: Patient tolerated treatment well;Patient limited by fatigue Patient left: in bed;with call bell/phone within reach;with bed alarm set Nurse Communication: Mobility status PT Visit Diagnosis: Other abnormalities of gait and mobility (R26.89);History of falling (Z91.81)     Time: 1610-9604 PT Time Calculation (min) (ACUTE ONLY): 18 min  Charges:    $Therapeutic Activity: 8-22 mins PT General Charges $$ ACUTE PT VISIT: 1 Visit                     Aubrianne Molyneux, PT  Acute Rehab Dept [facility]) [phone]  09/07/2023    [facility] 09/07/2023, 4:34 PM

## 2023-09-07 NOTE — Progress Notes (Signed)
 Initial Nutrition Assessment  DOCUMENTATION CODES:   Non-severe (moderate) malnutrition in context of chronic illness  INTERVENTION:  - Liberalize diet to Regular to provide the widest variety of menu choices and avoid restricting intake.  - Magic cup BID with meals, each supplement provides 290 kcal and 9 grams of protein - Encourage intake as tolerated at all meals and of supplements.  - Add Multivitamin with minerals daily. Continue vitamin C and zinc supplementation to support wound healing (patient also takes at home). - Monitor weight trends.   NUTRITION DIAGNOSIS:   Moderate Malnutrition related to chronic illness as evidenced by moderate fat depletion, severe muscle depletion.  GOAL:   Patient will meet greater than or equal to 90% of their needs  MONITOR:   PO intake, Supplement acceptance, Weight trends  REASON FOR ASSESSMENT:   Consult Assessment of nutrition requirement/status  ASSESSMENT:   78 y.o. female with PMH COPD, DM2, CKD, HTN who presented fir planned total hip arthroplasty for diagnosis of right hip avascular necrosis.  Met with patient and sister at bedside.  UBW reported to be 108-110# and patient denies any recent changes in weight.  Per EMR, weight has been stable over the past year.   Patient is very regimented in her intake at home.  Breakfast: 1 piece of peanut butter toast, black coffee Lunch: 1 piece of bread with pimento cheese, 1oz peanuts, 1/2 graham cracker, 1/2 diet coke/pepsi Dinner: 1 piece of bread with mayo and bananas, 5-7 chips, 1/2 graham crackers, and 1/2 coke/pepsi  Patient eating consistently PTA with normal appetite.   Patient reports having had dinner last night and breakfast this AM. Appetite is normal.  Discussed increased nutrient needs for healing. Patient worries she will not like tradition supplements. She is hesitantly agreeable to try [employer].    Medications reviewed and include: 500mg  vitamin C daily, 220mg   zinc daily, 325mg  ferrous sulfate daily, Colace, Senokot  Labs reviewed:  Creatinine 1.36 HA1C 5.4   NUTRITION - FOCUSED PHYSICAL EXAM:  Flowsheet Row Most Recent Value  Orbital Region No depletion  Upper Arm Region Severe depletion  Thoracic and Lumbar Region Moderate depletion  Buccal Region Mild depletion  Temple Region Mild depletion  Clavicle Bone Region Severe depletion  Clavicle and Acromion Bone Region Severe depletion  Scapular Bone Region Unable to assess  Dorsal Hand Moderate depletion  Patellar Region Mild depletion  Anterior Thigh Region Mild depletion  Posterior Calf Region Mild depletion  Edema (RD Assessment) None  Hair Reviewed  Eyes Reviewed  Mouth Reviewed  Skin Reviewed  Nails Reviewed       Diet Order:   Diet Order             Diet Heart Room service appropriate? Yes; Fluid consistency: Thin  Diet effective now                   EDUCATION NEEDS:  Education needs have been addressed  Skin:  Skin Assessment: Skin Integrity Issues: Skin Integrity Issues:: Incisions Incisions: Right Hip  Last BM:  3/12  Height:  Ht Readings from Last 1 Encounters:  09/07/23 5\' 3"  (1.6 m)   Weight:  Wt Readings from Last 1 Encounters:  09/07/23 50.2 kg    BMI:  Body mass index is 19.6 kg/m.  Estimated Nutritional Needs:  Kcal:  1500-1650 kcals Protein:  75-85 grams Fluid:  >/= 1.5L    Shelle Iron RD, LDN Contact via Secure Chat.

## 2023-09-07 NOTE — Evaluation (Signed)
 Physical Therapy Evaluation Patient Details Name: Ariel Cobb MRN: [id number] DOB: 1946-05-14 Today's Date: 09/07/2023  History of Present Illness  78 yo female  s/p Conversion of previous surgery to Right total hip arthroplasty, anterior approach.     PMH: arthritis, HTN, HLD, PAD,  R hip fx-s/p IM nail 2023, s/p removal of hardware 06/13/24, AVN R hip  Clinical Impression  Pt is s/p THA resulting in the deficits listed below (see PT Problem List).  Pt able to transition  to EOB, transfer to chair with min/mod assist, some difficulty adhering to TDWB. Will likely need another few days to work with PT to allow safe d/c home with pt sister; may need w/c or transport chair for ramp/home entry. Pt sister states they may be abel to borrow one  Pt will benefit from acute skilled PT to increase their independence and safety with mobility to facilitate discharge.          If plan is discharge home, recommend the following: A little help with walking and/or transfers;A little help with bathing/dressing/bathroom;Assistance with cooking/housework;Help with stairs or ramp for entrance;Assist for transportation   Can travel by private vehicle        Equipment Recommendations Wheelchair (measurements PT) (or transport chair if unable to borrow)  Recommendations for Other Services       Functional Status Assessment Patient has had a recent decline in their functional status and demonstrates the ability to make significant improvements in function in a reasonable and predictable amount of time.     Precautions / Restrictions Precautions Precautions: Fall Restrictions Weight Bearing Restrictions Per Provider Order: Yes RLE Weight Bearing Per Provider Order: Touchdown weight bearing      Mobility  Bed Mobility Overal bed mobility: Needs Assistance Bed Mobility: Supine to Sit     Supine to sit: Min assist     General bed mobility comments: assist with RLE    Transfers Overall  transfer level: Needs assistance Equipment used: Rolling walker (2 wheels) Transfers: Sit to/from Stand, Bed to chair/wheelchair/BSC Sit to Stand: Min assist           General transfer comment: cues for RLE position and hand placement; assist to rise and transition to RW; for stand pivot assist to steady, cues for TDWB, trunk extension; difficulty adhering to TDWB    Ambulation/Gait                  Stairs            Wheelchair Mobility     Tilt Bed    Modified Rankin (Stroke Patients Only)       Balance Overall balance assessment: Needs assistance Sitting-balance support: Feet supported, No upper extremity supported Sitting balance-Leahy Scale: Fair Sitting balance - Comments: wt shifting limited by pain   Standing balance support: Reliant on assistive device for balance, During functional activity Standing balance-Leahy Scale: Poor                               Pertinent Vitals/Pain Pain Assessment Pain Assessment: Faces Faces Pain Scale: Hurts even more Pain Location: R hip Pain Intervention(s): Limited activity within patient's tolerance, Monitored during session, Premedicated before session, Repositioned    Home Living Family/patient expects to be discharged to:: Private residence Living Arrangements: Other relatives Available Help at Discharge: Family Type of Home: House Home Access: Stairs to enter   [employer] of Steps: 2   Home Layout:  One level Home Equipment: Cane - single Information systems manager (2 wheels) Additional Comments: may be able to borrow w/c    Prior Function Prior Level of Function : Independent/Modified Independent             Mobility Comments: amb with RW for > 2 years per pt       Extremity/Trunk Assessment   Upper Extremity Assessment Upper Extremity Assessment: Overall WFL for tasks assessed    Lower Extremity Assessment Lower Extremity Assessment: RLE  deficits/detail RLE Deficits / Details: ankle WFL, knee and hip 2+/5 RLE: Unable to fully assess due to pain       Communication   Communication Communication: No apparent difficulties    Cognition Arousal: Alert Behavior During Therapy: WFL for tasks assessed/performed   PT - Cognitive impairments: No apparent impairments                         Following commands: Intact       Cueing Cueing Techniques: Verbal cues     General Comments      Exercises Total Joint Exercises Ankle Circles/Pumps: AROM, Both, 10 reps   Assessment/Plan    PT Assessment Patient needs continued PT services  PT Problem List Decreased strength;Decreased activity tolerance;Decreased range of motion;Decreased balance;Pain;Decreased knowledge of use of DME;Decreased mobility       PT Treatment Interventions DME instruction;Therapeutic exercise;Gait training;Functional mobility training;Therapeutic activities;Patient/family education    PT Goals (Current goals can be found in the Care Plan section)  Acute Rehab PT Goals Patient Stated Goal: home! PT Goal Formulation: With patient/family Time For Goal Achievement: 09/21/23 Potential to Achieve Goals: Good    Frequency 7X/week     Co-evaluation               AM-PAC PT "6 Clicks" Mobility  Outcome Measure Help needed turning from your back to your side while in a flat bed without using bedrails?: A Little Help needed moving from lying on your back to sitting on the side of a flat bed without using bedrails?: A Little Help needed moving to and from a bed to a chair (including a wheelchair)?: A Little Help needed standing up from a chair using your arms (e.g., wheelchair or bedside chair)?: A Little Help needed to walk in hospital room?: A Lot Help needed climbing 3-5 steps with a railing? : Total 6 Click Score: 15    End of Session Equipment Utilized During Treatment: Gait belt Activity Tolerance: Patient tolerated  treatment well Patient left: with call bell/phone within reach;with family/visitor present;in chair;with chair alarm set Nurse Communication: Mobility status PT Visit Diagnosis: Other abnormalities of gait and mobility (R26.89);History of falling (Z91.81)    Time: 1610-9604 PT Time Calculation (min) (ACUTE ONLY): 28 min   Charges:   PT Evaluation $PT Eval Low Complexity: 1 Low PT Treatments $Therapeutic Activity: 8-22 mins PT General Charges $$ ACUTE PT VISIT: 1 Visit         Margart Zemanek, PT  Acute Rehab Dept [facility]) [phone]  09/07/2023   [facility] 09/07/2023, 11:28 AM

## 2023-09-07 NOTE — Plan of Care (Signed)
   Problem: Activity: Goal: Risk for activity intolerance will decrease Outcome: Progressing   Problem: Pain Managment: Goal: General experience of comfort will improve and/or be controlled Outcome: Progressing   Problem: Safety: Goal: Ability to remain free from injury will improve Outcome: Progressing

## 2023-09-07 NOTE — Inpatient Diabetes Management (Addendum)
 Inpatient Diabetes Program Recommendations  AACE/ADA: New Consensus Statement on Inpatient Glycemic Control (2015)  Target Ranges:  Prepandial:   less than 140 mg/dL      Peak postprandial:   less than 180 mg/dL (1-2 hours)      Critically ill patients:  140 - 180 mg/dL   Lab Results  Component Value Date   GLUCAP 285 (H) 09/07/2023   HGBA1C 5.4 09/01/2023    Review of Glycemic Control  Latest Reference Range & Units 09/06/23 12:27 09/06/23 15:32 09/06/23 20:34 09/07/23 08:37 09/07/23 11:48  Glucose-Capillary 70 - 99 mg/dL 161 (H) 89 096 (H) 045 (H) 285 (H)  (H): Data is abnormally high  Diabetes history: T1DM Outpatient Diabetes medications: Basaglar 8 units every day, Novolog 3-7 units TID Current orders for Inpatient glycemic control: Novolog 0-9 units TID & 0-5 units QHS  Inpatient Diabetes Program Recommendations:    Might consider:  Lantus 4 units every day (50% of home dose). Novolog 2 units TID with meals IF she consumes at least 50%.  Met with patient at bedside.  She confirms she has T1DM.  Current with endocrinologist, Dr. Sharl Ma.    Will continue to follow while inpatient.  Thank you, Dulce Sellar, MSN, CDCES Diabetes Coordinator Inpatient Diabetes Program [phone] (team pager from 8a-5p)

## 2023-09-07 NOTE — TOC Transition Note (Signed)
 Transition of Care [facility]) - Discharge Note   Patient Details  Name: Ariel Cobb MRN: [id number] Date of Birth: [birth year]  Transition of Care [facility]) CM/SW Contact:  Howell Rucks, RN Phone Number: 09/07/2023, 9:27 AM   Clinical Narrative:   Met with pt at bedside to review dc therapy and home DME needs, pt confirmed HEP, has RW, no home DME needs. No TOC needs.     Final next level of care: Home/Self Care Barriers to Discharge: No Barriers Identified   Patient Goals and CMS Choice Patient states their goals for this hospitalization and ongoing recovery are:: return home          Discharge Placement                       Discharge Plan and Services Additional resources added to the After Visit Summary for                  DME Arranged: N/A DME Agency: NA                  Social Drivers of Health (SDOH) Interventions SDOH Screenings   Food Insecurity: No Food Insecurity (09/07/2023)  Housing: Low Risk  (09/07/2023)  Transportation Needs: No Transportation Needs (09/07/2023)  Utilities: Not At Risk (09/07/2023)  Social Connections: Moderately Isolated (09/07/2023)  Tobacco Use: High Risk (09/06/2023)     Readmission Risk Interventions     No data to display

## 2023-09-08 DIAGNOSIS — N1832 Chronic kidney disease, stage 3b: Secondary | ICD-10-CM | POA: Diagnosis not present

## 2023-09-08 DIAGNOSIS — E1165 Type 2 diabetes mellitus with hyperglycemia: Secondary | ICD-10-CM | POA: Diagnosis not present

## 2023-09-08 DIAGNOSIS — R059 Cough, unspecified: Secondary | ICD-10-CM | POA: Diagnosis not present

## 2023-09-08 DIAGNOSIS — I480 Paroxysmal atrial fibrillation: Secondary | ICD-10-CM | POA: Diagnosis not present

## 2023-09-08 DIAGNOSIS — E782 Mixed hyperlipidemia: Secondary | ICD-10-CM | POA: Diagnosis not present

## 2023-09-08 DIAGNOSIS — Z794 Long term (current) use of insulin: Secondary | ICD-10-CM | POA: Diagnosis not present

## 2023-09-08 DIAGNOSIS — Z79890 Hormone replacement therapy (postmenopausal): Secondary | ICD-10-CM | POA: Diagnosis not present

## 2023-09-08 DIAGNOSIS — Z91040 Latex allergy status: Secondary | ICD-10-CM | POA: Diagnosis not present

## 2023-09-08 DIAGNOSIS — R0602 Shortness of breath: Secondary | ICD-10-CM | POA: Diagnosis not present

## 2023-09-08 DIAGNOSIS — E039 Hypothyroidism, unspecified: Secondary | ICD-10-CM | POA: Diagnosis not present

## 2023-09-08 DIAGNOSIS — Z7951 Long term (current) use of inhaled steroids: Secondary | ICD-10-CM | POA: Diagnosis not present

## 2023-09-08 DIAGNOSIS — Z681 Body mass index (BMI) 19 or less, adult: Secondary | ICD-10-CM | POA: Diagnosis not present

## 2023-09-08 DIAGNOSIS — D62 Acute posthemorrhagic anemia: Secondary | ICD-10-CM | POA: Diagnosis not present

## 2023-09-08 DIAGNOSIS — Z7901 Long term (current) use of anticoagulants: Secondary | ICD-10-CM | POA: Diagnosis not present

## 2023-09-08 DIAGNOSIS — R918 Other nonspecific abnormal finding of lung field: Secondary | ICD-10-CM | POA: Diagnosis not present

## 2023-09-08 DIAGNOSIS — K5900 Constipation, unspecified: Secondary | ICD-10-CM | POA: Diagnosis not present

## 2023-09-08 DIAGNOSIS — M25551 Pain in right hip: Secondary | ICD-10-CM | POA: Diagnosis not present

## 2023-09-08 DIAGNOSIS — I10 Essential (primary) hypertension: Secondary | ICD-10-CM | POA: Diagnosis not present

## 2023-09-08 DIAGNOSIS — Z79899 Other long term (current) drug therapy: Secondary | ICD-10-CM | POA: Diagnosis not present

## 2023-09-08 DIAGNOSIS — E119 Type 2 diabetes mellitus without complications: Secondary | ICD-10-CM | POA: Diagnosis not present

## 2023-09-08 DIAGNOSIS — E1122 Type 2 diabetes mellitus with diabetic chronic kidney disease: Secondary | ICD-10-CM | POA: Diagnosis not present

## 2023-09-08 DIAGNOSIS — M87051 Idiopathic aseptic necrosis of right femur: Secondary | ICD-10-CM | POA: Diagnosis not present

## 2023-09-08 DIAGNOSIS — Z96641 Presence of right artificial hip joint: Secondary | ICD-10-CM | POA: Diagnosis not present

## 2023-09-08 DIAGNOSIS — Z743 Need for continuous supervision: Secondary | ICD-10-CM | POA: Diagnosis not present

## 2023-09-08 DIAGNOSIS — Z7401 Bed confinement status: Secondary | ICD-10-CM | POA: Diagnosis not present

## 2023-09-08 DIAGNOSIS — R5383 Other fatigue: Secondary | ICD-10-CM | POA: Diagnosis not present

## 2023-09-08 DIAGNOSIS — J4489 Other specified chronic obstructive pulmonary disease: Secondary | ICD-10-CM | POA: Diagnosis not present

## 2023-09-08 DIAGNOSIS — M8788 Other osteonecrosis, other site: Secondary | ICD-10-CM | POA: Diagnosis not present

## 2023-09-08 DIAGNOSIS — I129 Hypertensive chronic kidney disease with stage 1 through stage 4 chronic kidney disease, or unspecified chronic kidney disease: Secondary | ICD-10-CM | POA: Diagnosis not present

## 2023-09-08 DIAGNOSIS — E1169 Type 2 diabetes mellitus with other specified complication: Secondary | ICD-10-CM | POA: Diagnosis not present

## 2023-09-08 DIAGNOSIS — E440 Moderate protein-calorie malnutrition: Secondary | ICD-10-CM | POA: Diagnosis not present

## 2023-09-08 DIAGNOSIS — Z880 Allergy status to penicillin: Secondary | ICD-10-CM | POA: Diagnosis not present

## 2023-09-08 DIAGNOSIS — Z471 Aftercare following joint replacement surgery: Secondary | ICD-10-CM | POA: Diagnosis not present

## 2023-09-08 DIAGNOSIS — E1142 Type 2 diabetes mellitus with diabetic polyneuropathy: Secondary | ICD-10-CM | POA: Diagnosis not present

## 2023-09-08 DIAGNOSIS — F17210 Nicotine dependence, cigarettes, uncomplicated: Secondary | ICD-10-CM | POA: Diagnosis not present

## 2023-09-08 DIAGNOSIS — Z8249 Family history of ischemic heart disease and other diseases of the circulatory system: Secondary | ICD-10-CM | POA: Diagnosis not present

## 2023-09-08 DIAGNOSIS — M1611 Unilateral primary osteoarthritis, right hip: Secondary | ICD-10-CM | POA: Diagnosis not present

## 2023-09-08 LAB — BASIC METABOLIC PANEL
Anion gap: 9 (ref 5–15)
BUN: 45 mg/dL — ABNORMAL HIGH (ref 8–23)
CO2: 24 mmol/L (ref 22–32)
Calcium: 8.1 mg/dL — ABNORMAL LOW (ref 8.9–10.3)
Chloride: 100 mmol/L (ref 98–111)
Creatinine, Ser: 1.91 mg/dL — ABNORMAL HIGH (ref 0.44–1.00)
GFR, Estimated: 26 mL/min — ABNORMAL LOW (ref >60)
Glucose, Bld: 239 mg/dL — ABNORMAL HIGH (ref 70–99)
Potassium: 5 mmol/L (ref 3.5–5.1)
Sodium: 133 mmol/L — ABNORMAL LOW (ref 135–145)

## 2023-09-08 LAB — CBC
HCT: 23.1 % — ABNORMAL LOW (ref 36.0–46.0)
Hemoglobin: 7 g/dL — ABNORMAL LOW (ref 12.0–15.0)
MCH: 32.4 pg (ref 26.0–34.0)
MCHC: 30.3 g/dL (ref 30.0–36.0)
MCV: 106.9 fL — ABNORMAL HIGH (ref 80.0–100.0)
Platelets: 231 10*3/uL (ref 150–400)
RBC: 2.16 MIL/uL — ABNORMAL LOW (ref 3.87–5.11)
RDW: 14.6 % (ref 11.5–15.5)
WBC: 13.7 10*3/uL — ABNORMAL HIGH (ref 4.0–10.5)
nRBC: 0 % (ref 0.0–0.2)

## 2023-09-08 LAB — GLUCOSE, CAPILLARY
Glucose-Capillary: 226 mg/dL — ABNORMAL HIGH (ref 70–99)
Glucose-Capillary: 134 mg/dL — ABNORMAL HIGH (ref 70–99)
Glucose-Capillary: 288 mg/dL — ABNORMAL HIGH (ref 70–99)
Glucose-Capillary: 284 mg/dL — ABNORMAL HIGH (ref 70–99)

## 2023-09-08 LAB — PREPARE RBC (CROSSMATCH)

## 2023-09-08 MED ORDER — SODIUM CHLORIDE 0.9% IV SOLUTION: Freq: Once | INTRAVENOUS | Status: AC

## 2023-09-08 MED ORDER — INSULIN ASPART 100 UNIT/ML IJ SOLN
0.0000 [IU] | Freq: Every day | INTRAMUSCULAR | Status: DC
  Administered 2023-09-08: 3 [IU] via SUBCUTANEOUS
  Administered 2023-09-10: 2 [IU] via SUBCUTANEOUS
  Administered 2023-09-11: 4 [IU] via SUBCUTANEOUS
  Administered 2023-09-12: 3 [IU] via SUBCUTANEOUS
  Administered 2023-09-14: 4 [IU] via SUBCUTANEOUS
  Administered 2023-09-17: 2 [IU] via SUBCUTANEOUS

## 2023-09-08 MED ORDER — INSULIN GLARGINE 100 UNIT/ML ~~LOC~~ SOLN
4.0000 [IU] | Freq: Every day | SUBCUTANEOUS | Status: DC
  Administered 2023-09-08 – 2023-09-12 (×5): 4 [IU] via SUBCUTANEOUS
  Filled 2023-09-08 (×5): qty 0.04

## 2023-09-08 MED ORDER — ENSURE ENLIVE PO LIQD
237.0000 mL | Freq: Two times a day (BID) | ORAL | Status: DC
  Administered 2023-09-10 – 2023-09-14 (×4): 237 mL via ORAL

## 2023-09-08 NOTE — Progress Notes (Addendum)
    Subjective:  Patient reports pain as mild to moderate. Denies V/CP/Abd pain this morning. She reports intermittent SOB per baseline. She also reports waves of nausea. She did have a mild episode of emesis yesterday. None since that time. She reports she didn't drink much fluids yesterday after she burned her tongue on her coffee.  She states she did okay with PT but was hard to remember TDWB.  She states she would like to try and go home if possible, she had a bad experience with prior SNF.   Objective:   VITALS:   Vitals:   09/08/23 0406 09/08/23 0518 09/08/23 0751 09/08/23 0758  BP: (!) 104/49 (!) 123/55 (!) 140/47 (!) 144/47  Pulse: 66 65 65 68  Resp: 16 16 18 18   Temp: 97.7 F (36.5 C) 97.6 F (36.4 C) 97.9 F (36.6 C) (!) 97.5 F (36.4 C)  TempSrc: Oral   Oral  SpO2: 100% 100% 99% 99%  Weight:      Height:        NAD Neurologically intact ABD soft Neurovascular intact Sensation intact distally Intact pulses distally Dorsiflexion/Plantar flexion intact Incision: dressing C/D/I No cellulitis present Compartment soft   Lab Results  Component Value Date   WBC 13.7 (H) 09/08/2023   HGB 7.0 (L) 09/08/2023   HCT 23.1 (L) 09/08/2023   MCV 106.9 (H) 09/08/2023   PLT 231 09/08/2023   BMET    Component Value Date/Time   NA 133 (L) 09/08/2023 0319   NA 142 01/30/2023 1001   K 5.0 09/08/2023 0319   CL 100 09/08/2023 0319   CO2 24 09/08/2023 0319   GLUCOSE 239 (H) 09/08/2023 0319   BUN 45 (H) 09/08/2023 0319   BUN 31 (H) 01/30/2023 1001   CREATININE 1.91 (H) 09/08/2023 0319   CREATININE 1.42 (H) 07/24/2013 1236   CALCIUM 8.1 (L) 09/08/2023 0319   EGFR 42 (L) 01/30/2023 1001   GFRNONAA 26 (L) 09/08/2023 0319     Assessment/Plan: 2 Days Post-Op   Principal Problem:   S/P total right hip arthroplasty Active Problems:   Avascular necrosis of hip, right (HCC)  ABLA. Hemoglobin 7.0. Transfuse 2 units PRBCs. Continue to monitor. Will hold Eliquis due to  low hemoglobin. Restart when able.  CKD history. Creatinine 1.91 today. (1.36 yesterday). She reports poor fluid intake yesterday. Restart IV fluids today. Continue to monitor.   Touchdown weightbearing RLE with walker DVT ppx:  low dose Eliquis, hold for transfusion, restart when able, SCDs, TEDS PO pain control PT/OT: She did okay with TDWB yesterday. Fatigues easily. Continue PT today.  Dispo:  - Patient has baseline orthostatic hypotension and dizziness. We will see how she does with PT and with weightbearing restrictions.  - Plan for d/c home if able. Will likely stay through weekend for PT and maintaining TDWB restrictions.    Clois Dupes 09/08/2023, 10:30 AM   [facility]  [place] [address], [address], [place], [place] [address] Phone: [phone] [email or web address] Facebook  [employer]

## 2023-09-08 NOTE — Progress Notes (Signed)
 PT Cancellation Note  Patient Details Name: Ariel Cobb MRN: [id number] DOB: 15-Feb-1946   Cancelled Treatment:    Reason Eval/Treat Not Completed: Medical issues which prohibited therapy Pt now getting blood transfusion.  Reports she was really weak earlier and barely able to get back to bed.  Will hold OOB mobility.  Also, reports very weak and request to rest from exercises.  Will f/u tomorrow.   Anise Salvo, PT Acute Rehab [facility] [phone]   Rayetta Humphrey 09/08/2023, 3:03 PM

## 2023-09-08 NOTE — Plan of Care (Signed)
  Problem: Activity: Goal: Risk for activity intolerance will decrease Outcome: Progressing   Problem: Nutrition: Goal: Adequate nutrition will be maintained Outcome: Progressing   Problem: Coping: Goal: Level of anxiety will decrease Outcome: Progressing   Problem: Elimination: Goal: Will not experience complications related to urinary retention Outcome: Progressing   Problem: Pain Managment: Goal: General experience of comfort will improve and/or be controlled Outcome: Progressing

## 2023-09-08 NOTE — Progress Notes (Signed)
 Physical Therapy Treatment Patient Details Name: CHARLIE CHAR MRN: [id number] DOB: March 31, 1946 Today's Date: 09/08/2023   History of Present Illness 78 yo female  s/p Conversion of previous surgery to Right total hip arthroplasty, anterior approach.     PMH: arthritis, HTN, HLD, PAD,  R hip fx-s/p IM nail 2023, s/p removal of hardware 06/13/24, AVN R hip    PT Comments  Pt making gradual progress. She was able to ambulate 10' x 2 with min A and did well with TDWB.  Does need min-mod A for STS depending on surface.  Pt's sister unable to physically assist.  Will need further therapy prior to return home.     If plan is discharge home, recommend the following: A little help with walking and/or transfers;A little help with bathing/dressing/bathroom;Assistance with cooking/housework;Help with stairs or ramp for entrance;Assist for transportation   Can travel by private vehicle        Equipment Recommendations  Wheelchair (measurements PT) (sister to measure doorways)    Recommendations for Other Services       Precautions / Restrictions Precautions Precautions: Fall Restrictions RLE Weight Bearing Per Provider Order: Touchdown weight bearing     Mobility  Bed Mobility Overal bed mobility: Needs Assistance Bed Mobility: Supine to Sit     Supine to sit: Min assist     General bed mobility comments: assist with RLE, incr time needed    Transfers Overall transfer level: Needs assistance Equipment used: Rolling walker (2 wheels) Transfers: Sit to/from Stand Sit to Stand: Mod assist, Min assist           General transfer comment: Performed x 2 with min A from elevated bed but mod A from [facility].  Cues for R LE management and hand placement    Ambulation/Gait Ambulation/Gait assistance: Min assist Gait Distance (Feet): 10 Feet (10'x2) Assistive device: Rolling walker (2 wheels) Gait Pattern/deviations: Step-to pattern, Decreased stride length Gait velocity: decreased      General Gait Details: Does well wtih TDWB but fatigues easily, R LE internally rotates but corrects with cues, cues for sequencing with backing, min A to steady   Stairs             Wheelchair Mobility     Tilt Bed    Modified Rankin (Stroke Patients Only)       Balance Overall balance assessment: Needs assistance Sitting-balance support: Feet supported, No upper extremity supported Sitting balance-Leahy Scale: Fair     Standing balance support: Reliant on assistive device for balance, During functional activity Standing balance-Leahy Scale: Poor                              Communication    Cognition Arousal: Alert Behavior During Therapy: WFL for tasks assessed/performed   PT - Cognitive impairments: No apparent impairments                                Cueing    Exercises Total Joint Exercises Ankle Circles/Pumps: AROM, Both, 10 reps, Supine Long Arc Quad: AAROM, Right, 10 reps, Seated Knee Flexion: AAROM, Right, 10 reps, Seated, Limitations Knee Flexion Limitations: Noted pt keeping knee straight and reports painful - worked on knee flexion as able to loosen    General Comments        Pertinent Vitals/Pain Pain Assessment Pain Assessment: 0-10 Pain Score: 4  Pain Location: R  hip Pain Descriptors / Indicators: Sore, Guarding Pain Intervention(s): Limited activity within patient's tolerance, Monitored during session, Premedicated before session, Repositioned    Home Living                          Prior Function            PT Goals (current goals can now be found in the care plan section) Progress towards PT goals: Progressing toward goals    Frequency    7X/week      PT Plan      Co-evaluation              AM-PAC PT "6 Clicks" Mobility   Outcome Measure  Help needed turning from your back to your side while in a flat bed without using bedrails?: A Little Help needed moving from lying  on your back to sitting on the side of a flat bed without using bedrails?: A Little Help needed moving to and from a bed to a chair (including a wheelchair)?: A Little Help needed standing up from a chair using your arms (e.g., wheelchair or bedside chair)?: A Lot Help needed to walk in hospital room?: A Lot Help needed climbing 3-5 steps with a railing? : Total 6 Click Score: 14    End of Session Equipment Utilized During Treatment: Gait belt Activity Tolerance: Patient limited by fatigue Patient left: with call bell/phone within reach;with chair alarm set;in chair;with family/visitor present Nurse Communication: Mobility status PT Visit Diagnosis: Other abnormalities of gait and mobility (R26.89);History of falling (Z91.81)     Time: 1610-9604 PT Time Calculation (min) (ACUTE ONLY): 36 min  Charges:    $Gait Training: 8-22 mins $Therapeutic Exercise: 8-22 mins PT General Charges $$ ACUTE PT VISIT: 1 Visit                     Anise Salvo, PT Acute Rehab Services [facility] [phone]    Rayetta Humphrey 09/08/2023, 11:29 AM

## 2023-09-09 DIAGNOSIS — E440 Moderate protein-calorie malnutrition: Secondary | ICD-10-CM | POA: Insufficient documentation

## 2023-09-09 LAB — GLUCOSE, CAPILLARY
Glucose-Capillary: 146 mg/dL — ABNORMAL HIGH (ref 70–99)
Glucose-Capillary: 200 mg/dL — ABNORMAL HIGH (ref 70–99)
Glucose-Capillary: 267 mg/dL — ABNORMAL HIGH (ref 70–99)
Glucose-Capillary: 188 mg/dL — ABNORMAL HIGH (ref 70–99)

## 2023-09-09 LAB — HEMOGLOBIN AND HEMATOCRIT, BLOOD
HCT: 29 % — ABNORMAL LOW (ref 36.0–46.0)
Hemoglobin: 9.4 g/dL — ABNORMAL LOW (ref 12.0–15.0)

## 2023-09-09 NOTE — Progress Notes (Signed)
    Subjective: 3 Days Post-Op Procedure(s) (LRB): ARTHROPLASTY, HIP, TOTAL, ANTERIOR APPROACH (Right) Patient reports pain as moderate.   Denies CP or SOB.  Voiding without difficulty. Positive flatus. Objective: Vital signs in last 24 hours: Temp:  [97.5 F (36.4 C)-98.6 F (37 C)] 97.9 F (36.6 C) (03/15 0640) Pulse Rate:  [59-85] 64 (03/15 0640) Resp:  [14-18] 17 (03/15 0640) BP: (114-157)/(37-53) 153/45 (03/15 0640) SpO2:  [94 %-100 %] 94 % (03/15 0754)  Intake/Output from previous day: 03/14 0701 - 03/15 0700 In: 3143 [P.O.:720; I.V.:1725; Blood:698] Out: -  Intake/Output this shift: No intake/output data recorded.  Labs: Recent Labs    09/07/23 0330 09/08/23 0319 09/09/23 0002  HGB 7.9* 7.0* 9.4*   Recent Labs    09/07/23 0330 09/08/23 0319 09/09/23 0002  WBC 25.5* 13.7*  --   RBC 2.42* 2.16*  --   HCT 25.7* 23.1* 29.0*  PLT 274 231  --    Recent Labs    09/07/23 0330 09/08/23 0319  NA 136 133*  K 4.8 5.0  CL 103 100  CO2 23 24  BUN 32* 45*  CREATININE 1.36* 1.91*  GLUCOSE 186* 239*  CALCIUM 8.1* 8.1*   No results for input(s): "LABPT", "INR" in the last 72 hours.  Physical Exam: Neurologically intact Neurovascular intact Sensation intact distally Intact pulses distally Dorsiflexion/Plantar flexion intact Incision: dressing C/D/I Compartment soft Body mass index is 19.6 kg/m.   Assessment/Plan: 3 Days Post-Op Procedure(s) (LRB): ARTHROPLASTY, HIP, TOTAL, ANTERIOR APPROACH (Right)  Assessment: Ariel Cobb is a very pleasant 78 year old female who is postop day 3 from a right total hip arthroplasty.  Overall she is doing well.  However, she does state that she is having some increased right leg pain.  She does state that she is having right lower extremity pain however she states that this was going on prior to surgery.  She has a negative Homans' sign on exam, the distal calf is soft and not erythematous or edematous.  My suspicion for a  potential DVT is low.  Patient is ambulating with physical therapy and is making progress.  Patient is tolerating oral intake well.  Patient is voiding.  Plan: Continue IV fluids for today due to poor fluid intake P.o. pain control Continue DVT prophylaxis to include low-dose Eliquis, SCDs, and TED hose Continue with physical therapy and Occupational Therapy. Patient is wanting to be discharged to home as she is stated that she does not want want to attend SNF.  Patient will likely stay through the weekend so she can further progress in physical therapy Maintain TDWB restrictions with walker Hemoglobin from this morning was 9.4, this is a significant increase since yesterday 7.0. Continue incentive spirometry  Ariel Cobb for Dr. Venita Lick [facility] [phone] 09/09/2023, 8:30 AM

## 2023-09-09 NOTE — Progress Notes (Signed)
 Physical Therapy Treatment Patient Details Name: Ariel Cobb MRN: [id number] DOB: Oct 27, 1945 Today's Date: 09/09/2023   History of Present Illness 78 yo female  s/p Conversion of previous surgery to Right total hip arthroplasty, anterior approach.     PMH: arthritis, HTN, HLD, PAD,  R hip fx-s/p IM nail 2023, s/p removal of hardware 06/13/24, AVN R hip    PT Comments  Pt reports fatigue and increased pain.  Session focused on mobility/walking with limited exercises for pain control.  Pt ambulated 16' with RW and min A.  Initially doing well with TTWB but with fatigue needed more assist.  Pt would continue to benefit from further therapy prior to return home (noted in PA note pt does not want SNF).    If plan is discharge home, recommend the following: A little help with walking and/or transfers;A little help with bathing/dressing/bathroom;Assistance with cooking/housework;Help with stairs or ramp for entrance;Assist for transportation   Can travel by private Theme park manager (measurements PT)    Recommendations for Other Services       Precautions / Restrictions Precautions Precautions: Fall Restrictions RLE Weight Bearing Per Provider Order: Touchdown weight bearing     Mobility  Bed Mobility               General bed mobility comments: in chair    Transfers Overall transfer level: Needs assistance Equipment used: Rolling walker (2 wheels) Transfers: Sit to/from Stand Sit to Stand: Mod assist           General transfer comment: Cues for R LE management and hand placement; mod A to rise    Ambulation/Gait Ambulation/Gait assistance: Min assist, +2 safety/equipment Gait Distance (Feet): 16 Feet Assistive device: Rolling walker (2 wheels) Gait Pattern/deviations: Step-to pattern, Decreased stride length Gait velocity: decreased     General Gait Details: Does well wtih TDWB but fatigues easily, R LE internally rotates  but corrects with cues, cues for sequencing with backing, min A to steady and turning RW; chair follow   Stairs             Wheelchair Mobility     Tilt Bed    Modified Rankin (Stroke Patients Only)       Balance Overall balance assessment: Needs assistance Sitting-balance support: Feet supported, No upper extremity supported Sitting balance-Leahy Scale: Fair     Standing balance support: Reliant on assistive device for balance, During functional activity Standing balance-Leahy Scale: Poor                              Communication    Cognition Arousal: Alert Behavior During Therapy: WFL for tasks assessed/performed   PT - Cognitive impairments: No apparent impairments                                Cueing    Exercises Total Joint Exercises Ankle Circles/Pumps: AROM, Both, 10 reps, Supine Long Arc Quad: AAROM, Right, 10 reps, Seated Knee Flexion: AAROM, Right, 10 reps, Seated, Limitations Knee Flexion Limitations: Noted pt keeping knee straight and reports painful - worked on knee flexion as able to loosen Other Exercises Other Exercises: limited exercises for pain control    General Comments        Pertinent Vitals/Pain Pain Assessment Pain Assessment: 0-10 Pain Score: 8  Pain Location: R hip (with  activity) Pain Descriptors / Indicators: Sore, Guarding Pain Intervention(s): Limited activity within patient's tolerance, Premedicated before session, Monitored during session, Repositioned, Ice applied (pain does decrease after activity)    Home Living                          Prior Function            PT Goals (current goals can now be found in the care plan section) Progress towards PT goals: Progressing toward goals    Frequency    7X/week      PT Plan      Co-evaluation              AM-PAC PT "6 Clicks" Mobility   Outcome Measure  Help needed turning from your back to your side while in a  flat bed without using bedrails?: A Little Help needed moving from lying on your back to sitting on the side of a flat bed without using bedrails?: A Little Help needed moving to and from a bed to a chair (including a wheelchair)?: A Little Help needed standing up from a chair using your arms (e.g., wheelchair or bedside chair)?: A Lot Help needed to walk in hospital room?: A Lot Help needed climbing 3-5 steps with a railing? : Total 6 Click Score: 14    End of Session Equipment Utilized During Treatment: Gait belt Activity Tolerance: Patient limited by pain;Patient limited by fatigue Patient left: with call bell/phone within reach;with chair alarm set;in chair;with family/visitor present Nurse Communication: Mobility status PT Visit Diagnosis: Other abnormalities of gait and mobility (R26.89);History of falling (Z91.81)     Time: 1020-1037 PT Time Calculation (min) (ACUTE ONLY): 17 min  Charges:    $Gait Training: 8-22 mins PT General Charges $$ ACUTE PT VISIT: 1 Visit                     Anise Salvo, PT Acute Rehab Services [facility] [phone]    Rayetta Humphrey 09/09/2023, 10:53 AM

## 2023-09-09 NOTE — Progress Notes (Signed)
 Physical Therapy Treatment Patient Details Name: Ariel Cobb MRN: [id number] DOB: April 09, 1946 Today's Date: 09/09/2023   History of Present Illness 78 yo female  s/p Conversion of previous surgery to Right total hip arthroplasty, anterior approach.     PMH: arthritis, HTN, HLD, PAD,  R hip fx-s/p IM nail 2023, s/p removal of hardware 06/13/24, AVN R hip    PT Comments  Pt with slow progress due to pain and fatigue with TDWB status.  She required min A to ambulate 12' and mod A to stand multiple times.  Provided assist and limited ROM with exercises for pain control.  Pt continues to require increased assist and needs further therapy prior to return hom.  Sister not able to provide current level of assist.     If plan is discharge home, recommend the following: A little help with walking and/or transfers;A little help with bathing/dressing/bathroom;Assistance with cooking/housework;Help with stairs or ramp for entrance;Assist for transportation   Can travel by private Theme park manager (measurements PT)    Recommendations for Other Services       Precautions / Restrictions Precautions Precautions: Fall Restrictions RLE Weight Bearing Per Provider Order: Touchdown weight bearing     Mobility  Bed Mobility Overal bed mobility: Needs Assistance Bed Mobility: Sit to Supine       Sit to supine: Mod assist   General bed mobility comments: In chair at arrival; returned to bed - mod A for legs    Transfers Overall transfer level: Needs assistance Equipment used: Rolling walker (2 wheels) Transfers: Sit to/from Stand Sit to Stand: Mod assist           General transfer comment: Cues for R LE management and hand placement; mod A to rise; STS from chair x 2, STS from [facility] x 1    Ambulation/Gait Ambulation/Gait assistance: Min assist, +2 safety/equipment Gait Distance (Feet): 12 Feet (12', 3', 5') Assistive device: Rolling walker (2  wheels) Gait Pattern/deviations: Step-to pattern, Decreased stride length Gait velocity: decreased     General Gait Details: Does well wtih TDWB but fatigues easily, R LE internally rotates but corrects with cues, cues for sequencing with backing, min A to steady and turning RW; chair follow.  Pt ambulated to hallway, then tx to bsc, then back to bed   Stairs             Wheelchair Mobility     Tilt Bed    Modified Rankin (Stroke Patients Only)       Balance Overall balance assessment: Needs assistance Sitting-balance support: Feet supported, No upper extremity supported Sitting balance-Leahy Scale: Good     Standing balance support: Reliant on assistive device for balance, During functional activity Standing balance-Leahy Scale: Poor Standing balance comment: RW and min A; assist for toielting ADLs in standing                            Communication    Cognition Arousal: Alert Behavior During Therapy: WFL for tasks assessed/performed   PT - Cognitive impairments: No apparent impairments                                Cueing    Exercises Total Joint Exercises Ankle Circles/Pumps: AROM, Both, 10 reps, Supine Quad Sets: AROM, Both, 10 reps, Supine Heel Slides: AAROM, Right, 5 reps,  Supine (limited ROM) Long Arc Quad: AAROM, Right, 10 reps, Seated Knee Flexion: AAROM, Right, 10 reps, Seated, Limitations Knee Flexion Limitations: Noted pt keeping knee straight and reports painful - worked on knee flexion as able to loosen Other Exercises Other Exercises: limited exercises for pain control    General Comments        Pertinent Vitals/Pain Pain Assessment Pain Assessment: 0-10 Pain Score: 8  Pain Location: R hip (with activity) Pain Descriptors / Indicators: Sore, Guarding Pain Intervention(s): Limited activity within patient's tolerance, Monitored during session, Premedicated before session, Repositioned, Ice applied    Home  Living                          Prior Function            PT Goals (current goals can now be found in the care plan section) Progress towards PT goals: Progressing toward goals    Frequency    7X/week      PT Plan      Co-evaluation              AM-PAC PT "6 Clicks" Mobility   Outcome Measure  Help needed turning from your back to your side while in a flat bed without using bedrails?: A Little Help needed moving from lying on your back to sitting on the side of a flat bed without using bedrails?: A Lot Help needed moving to and from a bed to a chair (including a wheelchair)?: A Little Help needed standing up from a chair using your arms (e.g., wheelchair or bedside chair)?: A Lot Help needed to walk in hospital room?: A Lot Help needed climbing 3-5 steps with a railing? : Total 6 Click Score: 13    End of Session Equipment Utilized During Treatment: Gait belt Activity Tolerance: Patient limited by pain;Patient limited by fatigue Patient left: with call bell/phone within reach;in bed;with bed alarm set;with SCD's reapplied Nurse Communication: Mobility status PT Visit Diagnosis: Other abnormalities of gait and mobility (R26.89);History of falling (Z91.81)     Time: 1610-9604 PT Time Calculation (min) (ACUTE ONLY): 37 min  Charges:    $Gait Training: 8-22 mins $Therapeutic Exercise: 8-22 mins PT General Charges $$ ACUTE PT VISIT: 1 Visit                     Anise Salvo, PT Acute Rehab [facility] [phone]    Ariel Cobb 09/09/2023, 3:59 PM

## 2023-09-10 ENCOUNTER — Inpatient Hospital Stay (HOSPITAL_COMMUNITY)

## 2023-09-10 LAB — CBC
HCT: 30.6 % — ABNORMAL LOW (ref 36.0–46.0)
Hemoglobin: 9.8 g/dL — ABNORMAL LOW (ref 12.0–15.0)
MCH: 30.7 pg (ref 26.0–34.0)
MCHC: 32 g/dL (ref 30.0–36.0)
MCV: 95.9 fL (ref 80.0–100.0)
Platelets: 233 10*3/uL (ref 150–400)
RBC: 3.19 MIL/uL — ABNORMAL LOW (ref 3.87–5.11)
RDW: 17.9 % — ABNORMAL HIGH (ref 11.5–15.5)
WBC: 14.9 10*3/uL — ABNORMAL HIGH (ref 4.0–10.5)
nRBC: 0 % (ref 0.0–0.2)

## 2023-09-10 LAB — GLUCOSE, CAPILLARY
Glucose-Capillary: 227 mg/dL — ABNORMAL HIGH (ref 70–99)
Glucose-Capillary: 189 mg/dL — ABNORMAL HIGH (ref 70–99)
Glucose-Capillary: 339 mg/dL — ABNORMAL HIGH (ref 70–99)
Glucose-Capillary: 225 mg/dL — ABNORMAL HIGH (ref 70–99)

## 2023-09-10 MED ORDER — GUAIFENESIN ER 600 MG PO TB12
600.0000 mg | ORAL_TABLET | Freq: Two times a day (BID) | ORAL | Status: DC
  Administered 2023-09-10 – 2023-09-18 (×17): 600 mg via ORAL
  Filled 2023-09-10 (×18): qty 1

## 2023-09-10 MED ORDER — APIXABAN 2.5 MG PO TABS
2.5000 mg | ORAL_TABLET | Freq: Two times a day (BID) | ORAL | Status: DC
  Administered 2023-09-10 – 2023-09-18 (×17): 2.5 mg via ORAL
  Filled 2023-09-10 (×17): qty 1

## 2023-09-10 NOTE — Plan of Care (Signed)
  Problem: Activity: Goal: Risk for activity intolerance will decrease Outcome: Progressing   Problem: Nutrition: Goal: Adequate nutrition will be maintained Outcome: Progressing   Problem: Elimination: Goal: Will not experience complications related to urinary retention Outcome: Progressing   

## 2023-09-10 NOTE — Progress Notes (Signed)
 PT TX NOTE  09/10/23 1400  PT Visit Information  Last PT Received On 09/10/23   Progress slow, limited by fatigue, decr UB strength combined with TDWB status RLE and pain.  recommend SNF as pt sister cannot provide any physical assist. Pt declines SNF at this time. Encouraged pt to speak with her sister.  Will work on lateral scooting transfer next sessions for tentative plan for pt to d/c home at w/c level.  History of Present Illness 78 yo female  s/p Conversion of previous surgery to Right total hip arthroplasty, anterior approach.     PMH: arthritis, HTN, HLD, PAD,  R hip fx-s/p IM nail 2023, s/p removal of hardware 06/13/24, AVN R hip  Subjective Data  Patient Stated Goal home!  Precautions  Precautions Fall  Recall of Precautions/Restrictions Intact  Restrictions  RLE Weight Bearing Per Provider Order TWB  Pain Assessment  Pain Assessment 0-10  Pain Score 5  Pain Location R hip (with activity)  Pain Descriptors / Indicators Sore;Guarding  Pain Intervention(s) Limited activity within patient's tolerance;Monitored during session;Repositioned  Cognition  Arousal Alert  Behavior During Therapy WFL for tasks assessed/performed  PT - Cognitive impairments No apparent impairments  Cueing  Cueing Techniques Verbal cues  Communication  Communication No apparent difficulties  Bed Mobility  Overal bed mobility Needs Assistance  Bed Mobility Sit to Supine  Sit to supine Mod assist  General bed mobility comments In chair at arrival; returned to bed - mod A for LEs, bed pad used to  assist  scooting laterally in supine  Transfers  Overall transfer level Needs assistance  Equipment used Rolling walker (2 wheels)  Transfers Sit to/from Stand;Bed to chair/wheelchair/BSC  Sit to Stand Mod assist  Bed to/from chair/wheelchair/BSC transfer type: Step pivot  Step pivot transfers Min assist;Mod assist;+2 safety/equipment;+2 physical assistance  General transfer comment Cues for R LE  management and hand placement; mod A to rise; assist to balance, move RW and maintain TDWB-intermittent difficulty d/t UE fatigue; STS from chairx1 STS from [facility] x 1  PT - End of Session  Equipment Utilized During Treatment Gait belt  Activity Tolerance Patient tolerated treatment well  Patient left with call bell/phone within reach;in bed;with bed alarm set  Nurse Communication Mobility status   PT - Assessment/Plan  PT Visit Diagnosis Other abnormalities of gait and mobility (R26.89);History of falling (Z91.81)  PT Frequency (ACUTE ONLY) 7X/week  Follow Up Recommendations Follow physician's recommendations for discharge plan and follow up therapies (needs SNF)  Patient can return home with the following A lot of help with walking and/or transfers;A lot of help with bathing/dressing/bathroom;Assistance with cooking/housework;Assist for transportation;Help with stairs or ramp for entrance  PT equipment Wheelchair (measurements PT)  AM-PAC PT "6 Clicks" Mobility Outcome Measure (Version 2)  Help needed turning from your back to your side while in a flat bed without using bedrails? 3  Help needed moving from lying on your back to sitting on the side of a flat bed without using bedrails? 2  Help needed moving to and from a bed to a chair (including a wheelchair)? 2  Help needed standing up from a chair using your arms (e.g., wheelchair or bedside chair)? 2  Help needed to walk in hospital room? 2  Help needed climbing 3-5 steps with a railing?  1  6 Click Score 12  Consider Recommendation of Discharge To: CIR/SNF/LTACH  PT Goal Progression  Progress towards PT goals Progressing toward goals  Acute Rehab PT Goals  PT Goal Formulation With patient/family  Time For Goal Achievement 09/21/23  Potential to Achieve Goals Good  PT Time Calculation  PT Start Time (ACUTE ONLY) 1341  PT Stop Time (ACUTE ONLY) 1357  PT Time Calculation (min) (ACUTE ONLY) 16 min  PT General Charges  $$ ACUTE PT  VISIT 1 Visit  PT Treatments  $Therapeutic Activity 8-22 mins

## 2023-09-10 NOTE — Progress Notes (Signed)
 Physical Therapy Treatment Patient Details Name: Ariel Cobb MRN: [id number] DOB: 03/25/1946 Today's Date: 09/10/2023   History of Present Illness 78 yo female  s/p Conversion of previous surgery to Right total hip arthroplasty, anterior approach.     PMH: arthritis, HTN, HLD, PAD,  R hip fx-s/p IM nail 2023, s/p removal of hardware 06/13/24, AVN R hip    PT Comments  Pt progressing slowly. Continues to require mod assist for STS transfers, difficulty maintaining TDWB this session; limited to step pivot transfer d/c pain and fatigue.  Pt needs SNF, she continues to refuse. Sister Shanon Rosser provide physical assist.  Pt will likely need to plan to d/c home at w/c level, if STS transfers do not improve will begin to address lateral scooting transfers in effort to get pt to  a level she and her sister can manage at home.    If plan is discharge home, recommend the following: A lot of help with walking and/or transfers;A lot of help with bathing/dressing/bathroom;Assistance with cooking/housework;Assist for transportation;Help with stairs or ramp for entrance   Can travel by private vehicle        Equipment Recommendations  Wheelchair (measurements PT)    Recommendations for Other Services       Precautions / Restrictions Precautions Precautions: Fall Recall of Precautions/Restrictions: Intact Restrictions RLE Weight Bearing Per Provider Order: Touchdown weight bearing     Mobility  Bed Mobility Overal bed mobility: Needs Assistance Bed Mobility: Supine to Sit     Supine to sit: Min assist     General bed mobility comments: incr time and effort. assist to scoot laterall and progress RLE off bed    Transfers Overall transfer level: Needs assistance Equipment used: Rolling walker (2 wheels) Transfers: Sit to/from Stand, Bed to chair/wheelchair/BSC Sit to Stand: Mod assist   Step pivot transfers: Min assist       General transfer comment: Cues for R LE management and  hand placement; mod A to rise from elevated bed. assist balance, progress RLE and maneuver RW for stand pivot    Ambulation/Gait               General Gait Details: tood fatigued, difficulty maintaining  TDWB   Stairs             Wheelchair Mobility     Tilt Bed    Modified Rankin (Stroke Patients Only)       Balance   Sitting-balance support: Feet supported, No upper extremity supported Sitting balance-Leahy Scale: Good     Standing balance support: Reliant on assistive device for balance, During functional activity Standing balance-Leahy Scale: Poor Standing balance comment: RW and min A;                            Communication Communication Communication: No apparent difficulties  Cognition Arousal: Alert Behavior During Therapy: WFL for tasks assessed/performed   PT - Cognitive impairments: No apparent impairments                                Cueing Cueing Techniques: Verbal cues  Exercises Total Joint Exercises Ankle Circles/Pumps: AROM, Both, 10 reps, Supine    General Comments        Pertinent Vitals/Pain Pain Assessment Pain Assessment: 0-10 Pain Score: 7  Pain Location: R hip (with activity) Pain Descriptors / Indicators: Sore, Guarding Pain Intervention(s): Limited activity within  patient's tolerance, Monitored during session, Premedicated before session, Repositioned    Home Living                          Prior Function            PT Goals (current goals can now be found in the care plan section) Acute Rehab PT Goals Patient Stated Goal: home! PT Goal Formulation: With patient/family Time For Goal Achievement: 09/21/23 Potential to Achieve Goals: Good Progress towards PT goals: Progressing toward goals    Frequency    7X/week      PT Plan      Co-evaluation              AM-PAC PT "6 Clicks" Mobility   Outcome Measure  Help needed turning from your back to your side  while in a flat bed without using bedrails?: A Little Help needed moving from lying on your back to sitting on the side of a flat bed without using bedrails?: A Lot Help needed moving to and from a bed to a chair (including a wheelchair)?: A Little Help needed standing up from a chair using your arms (e.g., wheelchair or bedside chair)?: A Lot Help needed to walk in hospital room?: A Lot Help needed climbing 3-5 steps with a railing? : Total 6 Click Score: 13    End of Session Equipment Utilized During Treatment: Gait belt Activity Tolerance: Patient limited by pain;Patient limited by fatigue Patient left: in chair;with call bell/phone within reach;with chair alarm set   PT Visit Diagnosis: Other abnormalities of gait and mobility (R26.89);History of falling (Z91.81)     Time: 1040-1108 PT Time Calculation (min) (ACUTE ONLY): 28 min  Charges:    $Gait Training: 8-22 mins $Therapeutic Activity: 8-22 mins PT General Charges $$ ACUTE PT VISIT: 1 Visit                     Jozee Hammer, PT  Acute Rehab Dept [facility]) [phone]  09/10/2023    [facility] 09/10/2023, 11:16 AM

## 2023-09-10 NOTE — Progress Notes (Signed)
 Subjective: 4 Days Post-Op Procedure(s) (LRB): ARTHROPLASTY, HIP, TOTAL, ANTERIOR APPROACH (Right) Patient reports pain as moderate.    Objective: Vital signs in last 24 hours: Temp:  [98.2 F (36.8 C)-98.9 F (37.2 C)] 98.4 F (36.9 C) (03/16 0422) Pulse Rate:  [63-77] 77 (03/16 0422) Resp:  [15-18] 18 (03/16 0422) BP: (151-164)/(49-61) 158/61 (03/16 0422) SpO2:  [95 %-100 %] 95 % (03/16 0422)  Intake/Output from previous day: 03/15 0701 - 03/16 0700 In: 1451.3 [P.O.:720; I.V.:731.3] Out: -  Intake/Output this shift: No intake/output data recorded.  Recent Labs    09/08/23 0319 09/09/23 0002  HGB 7.0* 9.4*   Recent Labs    09/08/23 0319 09/09/23 0002  WBC 13.7*  --   RBC 2.16*  --   HCT 23.1* 29.0*  PLT 231  --    Recent Labs    09/08/23 0319  NA 133*  K 5.0  CL 100  CO2 24  BUN 45*  CREATININE 1.91*  GLUCOSE 239*  CALCIUM 8.1*   No results for input(s): "LABPT", "INR" in the last 72 hours.  Neurologically intact ABD soft Neurovascular intact Sensation intact distally Intact pulses distally Dorsiflexion/Plantar flexion intact Incision: dressing C/D/I and no drainage No cellulitis present Compartment soft No calf pain or sign of DVT   Assessment/Plan: 4 Days Post-Op Procedure(s) (LRB): ARTHROPLASTY, HIP, TOTAL, ANTERIOR APPROACH (Right) Advance diet Up with therapy D/C IV fluids TDWB RLE Eliquis for DVT ppx   Dorothy Spark 09/10/2023, 8:43 AM

## 2023-09-11 ENCOUNTER — Ambulatory Visit: Payer: Medicare HMO | Admitting: Podiatry

## 2023-09-11 LAB — BPAM RBC
Blood Product Expiration Date: 202503242359
Blood Product Expiration Date: 202504232359
ISSUE DATE / TIME: 202503141345
ISSUE DATE / TIME: 202503141724
Unit Type and Rh: 9500
Unit Type and Rh: 9500

## 2023-09-11 LAB — TYPE AND SCREEN
ABO/RH(D): O NEG
Antibody Screen: NEGATIVE
Unit division: 0
Unit division: 0

## 2023-09-11 LAB — GLUCOSE, CAPILLARY
Glucose-Capillary: 187 mg/dL — ABNORMAL HIGH (ref 70–99)
Glucose-Capillary: 296 mg/dL — ABNORMAL HIGH (ref 70–99)
Glucose-Capillary: 376 mg/dL — ABNORMAL HIGH (ref 70–99)
Glucose-Capillary: 403 mg/dL — ABNORMAL HIGH (ref 70–99)
Glucose-Capillary: 301 mg/dL — ABNORMAL HIGH (ref 70–99)

## 2023-09-11 LAB — BASIC METABOLIC PANEL
Anion gap: 10 (ref 5–15)
BUN: 37 mg/dL — ABNORMAL HIGH (ref 8–23)
CO2: 24 mmol/L (ref 22–32)
Calcium: 8.5 mg/dL — ABNORMAL LOW (ref 8.9–10.3)
Chloride: 102 mmol/L (ref 98–111)
Creatinine, Ser: 1.35 mg/dL — ABNORMAL HIGH (ref 0.44–1.00)
GFR, Estimated: 40 mL/min — ABNORMAL LOW (ref >60)
Glucose, Bld: 258 mg/dL — ABNORMAL HIGH (ref 70–99)
Potassium: 3.5 mmol/L (ref 3.5–5.1)
Sodium: 136 mmol/L (ref 135–145)

## 2023-09-11 MED ORDER — RAMIPRIL 1.25 MG PO CAPS
2.5000 mg | ORAL_CAPSULE | Freq: Every day | ORAL | Status: DC
  Administered 2023-09-11 – 2023-09-18 (×8): 2.5 mg via ORAL
  Filled 2023-09-11 (×8): qty 2

## 2023-09-11 NOTE — Evaluation (Signed)
 Occupational Therapy Evaluation Patient Details Name: Ariel Cobb MRN: [id number] DOB: September 07, 1945 Today's Date: 09/11/2023   History of Present Illness   78 yo female  s/p Conversion of previous surgery to Right total hip arthroplasty, anterior approach.     PMH: arthritis, HTN, HLD, PAD,  R hip fx-s/p IM nail 2023, s/p removal of hardware 06/13/24, AVN R hip     Clinical Impressions Pt presents with decline in function and safety with ADLs and ADL mobility with impaired strength, balance and endurance; pt having with difficulty maintaining R LE TDWB and extensive assist with current level of care. Pt currently requires max - total A with LB ADLs, max A with toileting and max A wit toileting tasks, mod A with sit-stand/SPTs. Pt needs SNF rehab post acute as her sister is unable to physically assist. OT will follow acutely to maximize level of function and safety      If plan is discharge home, recommend the following:   A lot of help with bathing/dressing/bathroom;A lot of help with walking and/or transfers;Assist for transportation;Help with stairs or ramp for entrance;Assistance with cooking/housework     Functional Status Assessment   Patient has had a recent decline in their functional status and demonstrates the ability to make significant improvements in function in a reasonable and predictable amount of time.     Equipment Recommendations   Tub/shower bench     Recommendations for Other Services         Precautions/Restrictions   Precautions Precautions: Fall Recall of Precautions/Restrictions: Intact Restrictions Weight Bearing Restrictions Per Provider Order: Yes RLE Weight Bearing Per Provider Order: Touchdown weight bearing     Mobility Bed Mobility               General bed mobility comments: pt in recliner upon arrival    Transfers Overall transfer level: Needs assistance Equipment used: Rolling walker (2 wheels) Transfers: Sit  to/from Stand, Bed to chair/wheelchair/BSC Sit to Stand: Mod assist Stand pivot transfers: Mod assist   Step pivot transfers: Mod assist     General transfer comment: Cues for R LE hand placement and R LE mgt; mod A to rise from recliner by powering up. Required assist to progress RLE and maneuver RW for SPT to [facility]; having difficulty maintaining TDWB--max A      Balance Overall balance assessment: Needs assistance Sitting-balance support: Feet supported, No upper extremity supported Sitting balance-Leahy Scale: Good     Standing balance support: Reliant on assistive device for balance, During functional activity Standing balance-Leahy Scale: Poor                             ADL either performed or assessed with clinical judgement   ADL Overall ADL's : Needs assistance/impaired     Grooming: Wash/dry hands;Wash/dry face;Contact guard assist;Sitting   Upper Body Bathing: Contact guard assist;Sitting   Lower Body Bathing: Maximal assistance   Upper Body Dressing : Contact guard assist;Sitting   Lower Body Dressing: Total assistance   Toilet Transfer: Moderate assistance;Stand-pivot;Rolling walker (2 wheels);Cueing for safety;BSC/3in1   Toileting- Clothing Manipulation and Hygiene: Maximal assistance;Sitting/lateral lean       Functional mobility during ADLs: Moderate assistance General ADL Comments: max A for TDWB adherence     Vision Baseline Vision/History: 1 Wears glasses Ability to See in Adequate Light: 0 Adequate Patient Visual Report: No change from baseline       Perception  Praxis         Pertinent Vitals/Pain Pain Assessment Pain Assessment: Faces Faces Pain Scale: Hurts little more Pain Location: R hip (with activity) Pain Descriptors / Indicators: Sore, Guarding Pain Intervention(s): Limited activity within patient's tolerance, Monitored during session, Premedicated before session, Repositioned     Extremity/Trunk  Assessment Upper Extremity Assessment Upper Extremity Assessment: Generalized weakness   Lower Extremity Assessment Lower Extremity Assessment: Defer to PT evaluation       Communication Communication Communication: No apparent difficulties   Cognition Arousal: Alert   Cognition: No apparent impairments                               Following commands: Intact       Cueing  General Comments   Cueing Techniques: Verbal cues      Exercises     Shoulder Instructions      Home Living Family/patient expects to be discharged to:: Private residence Living Arrangements: Other relatives Available Help at Discharge: Family Type of Home: House Home Access: Stairs to enter Secretary/administrator of Steps: 2   Home Layout: One level     Bathroom Shower/Tub: Chief Strategy Officer: Standard     Home Equipment: Cane - single Information systems manager (2 wheels)          Prior Functioning/Environment Prior Level of Function : Independent/Modified Independent             Mobility Comments: used RW ADLs Comments: Ind with ADLs/elfcare, IADLs, cooks    OT Problem List: Decreased strength;Impaired balance (sitting and/or standing);Pain;Decreased activity tolerance;Decreased coordination;Decreased knowledge of use of DME or AE   OT Treatment/Interventions: Self-care/ADL training;DME and/or AE instruction;Therapeutic activities;Balance training;Patient/family education      OT Goals(Current goals can be found in the care plan section)   Acute Rehab OT Goals Patient Stated Goal: get better, go home OT Goal Formulation: With patient Time For Goal Achievement: 09/25/23 Potential to Achieve Goals: Good ADL Goals Pt Will Perform Grooming: with supervision;with set-up;sitting Pt Will Perform Upper Body Bathing: with supervision;with set-up;sitting Pt Will Perform Lower Body Bathing: with mod assist;sitting/lateral leans;with adaptive  equipment Pt Will Perform Upper Body Dressing: with supervision;with set-up;sitting Pt Will Transfer to Toilet: with min assist;stand pivot transfer;bedside commode Pt Will Perform Toileting - Clothing Manipulation and hygiene: with mod assist;with min assist;sitting/lateral leans   OT Frequency:  Min 1X/week    Co-evaluation              AM-PAC OT "6 Clicks" Daily Activity     Outcome Measure Help from another person eating meals?: None Help from another person taking care of personal grooming?: A Little Help from another person toileting, which includes using toliet, bedpan, or urinal?: A Lot Help from another person bathing (including washing, rinsing, drying)?: A Lot Help from another person to put on and taking off regular upper body clothing?: A Little Help from another person to put on and taking off regular lower body clothing?: Total 6 Click Score: 15   End of Session Equipment Utilized During Treatment: Gait belt;Rolling walker (2 wheels);Other (comment) (BSC)  Activity Tolerance: Patient limited by fatigue;Patient tolerated treatment well Patient left: in chair;with call bell/phone within reach;with chair alarm set;with nursing/sitter in room  OT Visit Diagnosis: Unsteadiness on feet (R26.81);Other abnormalities of gait and mobility (R26.89);Muscle weakness (generalized) (M62.81);Pain Pain - Right/Left: Right Pain - part of body: Hip  Time: 6962-9528 OT Time Calculation (min): 18 min Charges:  OT Evaluation $OT Eval Moderate Complexity: 1 Mod Galen Manila 09/11/2023, 1:24 PM

## 2023-09-11 NOTE — Progress Notes (Signed)
 Physical Therapy Treatment Patient Details Name: Ariel Cobb MRN: [id number] DOB: [birth year] Today's Date: 09/11/2023   History of Present Illness 78 yo female  s/p Conversion of previous surgery to Right total hip arthroplasty, anterior approach.     PMH: arthritis, HTN, HLD, PAD,  R hip fx-s/p IM nail 2023, s/p removal of hardware 06/13/24, AVN R hip    PT Comments  Continued slow progress d/t decr UB strength, pain, TDWB, fatigue and baseline deconditioning. Pt on purewick this morning with bed soaked, gown etc; gown changed.  Pt is requiring mod to max assist for bed mobility, transfers this session. Pt sister present.  Pt needs SNF rehab post acute as her sister is unable to physically assist. Disposition per ortho MD.  Continue to follow in acute setting   If plan is discharge home, recommend the following: A lot of help with walking and/or transfers;A lot of help with bathing/dressing/bathroom;Assistance with cooking/housework;Assist for transportation;Help with stairs or ramp for entrance   Can travel by private vehicle        Equipment Recommendations  Wheelchair (measurements PT)    Recommendations for Other Services       Precautions / Restrictions Precautions Precautions: Fall Recall of Precautions/Restrictions: Intact Restrictions RLE Weight Bearing Per Provider Order: Touchdown weight bearing     Mobility  Bed Mobility Overal bed mobility: Needs Assistance Bed Mobility: Supine to Sit     Supine to sit: Min assist, Mod assist     General bed mobility comments: incr time and effortbed pad used to. assist to scoot laterally and progress RLE off bed    Transfers Overall transfer level: Needs assistance Equipment used: Rolling walker (2 wheels) Transfers: Sit to/from Stand, Bed to chair/wheelchair/BSC Sit to Stand: Mod assist   Step pivot transfers: Mod assist      Lateral/Scoot Transfers: Mod assist, Max assist General transfer comment: Cues for R  LE management and hand placement; mod A to rise from elevated bed. assist balance, progress RLE and maneuver RW for stand step  pivot. attempted lateral scooting transfer however pt unable and having difficulty maintaining TDWB--max assist    Ambulation/Gait                   Stairs             Wheelchair Mobility     Tilt Bed    Modified Rankin (Stroke Patients Only)       Balance   Sitting-balance support: Feet supported, No upper extremity supported Sitting balance-Leahy Scale: Good     Standing balance support: Reliant on assistive device for balance, During functional activity Standing balance-Leahy Scale: Poor Standing balance comment: RW and min A;                            Communication Communication Communication: No apparent difficulties  Cognition Arousal: Alert Behavior During Therapy: WFL for tasks assessed/performed   PT - Cognitive impairments: No apparent impairments                                Cueing Cueing Techniques: Verbal cues  Exercises Total Joint Exercises Ankle Circles/Pumps: AROM, Both, 10 reps, Supine Heel Slides: AAROM, Right, 5 reps, Supine    General Comments        Pertinent Vitals/Pain Pain Assessment Pain Assessment: 0-10 Pain Score: 5  Pain Location: R hip (with activity)  Pain Descriptors / Indicators: Sore, Guarding Pain Intervention(s): Limited activity within patient's tolerance, Monitored during session, Premedicated before session, Repositioned    Home Living                          Prior Function            PT Goals (current goals can now be found in the care plan section) Acute Rehab PT Goals Patient Stated Goal: home! PT Goal Formulation: With patient/family Time For Goal Achievement: 09/21/23 Potential to Achieve Goals: Good Progress towards PT goals: Progressing toward goals    Frequency    7X/week      PT Plan      Co-evaluation               AM-PAC PT "6 Clicks" Mobility   Outcome Measure  Help needed turning from your back to your side while in a flat bed without using bedrails?: A Little Help needed moving from lying on your back to sitting on the side of a flat bed without using bedrails?: A Lot Help needed moving to and from a bed to a chair (including a wheelchair)?: A Little Help needed standing up from a chair using your arms (e.g., wheelchair or bedside chair)?: A Lot Help needed to walk in hospital room?: A Lot Help needed climbing 3-5 steps with a railing? : Total 6 Click Score: 13    End of Session Equipment Utilized During Treatment: Gait belt Activity Tolerance: Patient limited by pain;Patient limited by fatigue Patient left: in chair;with call bell/phone within reach;with chair alarm set   PT Visit Diagnosis: Other abnormalities of gait and mobility (R26.89);History of falling (Z91.81)     Time: 0950-1040 PT Time Calculation (min) (ACUTE ONLY): 50 min  Charges:    $Therapeutic Activity: 38-52 mins PT General Charges $$ ACUTE PT VISIT: 1 Visit                     Jayce Boyko, PT  Acute Rehab Dept [facility]) [phone]  09/11/2023    [facility] 09/11/2023, 10:49 AM

## 2023-09-11 NOTE — Plan of Care (Signed)
  Problem: Safety: Goal: Ability to remain free from injury will improve Outcome: Progressing   Problem: Pain Managment: Goal: General experience of comfort will improve and/or be controlled Outcome: Progressing   Problem: Activity: Goal: Ability to avoid complications of mobility impairment will improve Outcome: Progressing Goal: Ability to tolerate increased activity will improve Outcome: Progressing

## 2023-09-11 NOTE — Progress Notes (Addendum)
 PT TX NOTE--pm session POD#4  09/11/23 1500  PT Visit Information  Last PT Received On 09/11/23   Continued slow progress d/t decr UB strength, pain, TDWB, fatigue and baseline deconditioning.  Pt is requiring mod assist for bed mobility, transfers this. Patient will benefit from continued inpatient follow up therapy, <3 hours/day at d/c   History of Present Illness 78 yo female  s/p Conversion of previous surgery to Right total hip arthroplasty, anterior approach.     PMH: arthritis, HTN, HLD, PAD,  R hip fx-s/p IM nail 2023, s/p removal of hardware 06/13/24, AVN R hip  Subjective Data  Patient Stated Goal home!  Precautions  Precautions Fall  Recall of Precautions/Restrictions Intact  Restrictions  RLE Weight Bearing Per Provider Order TWB  Pain Assessment  Pain Assessment Faces  Faces Pain Scale 6  Pain Location R hip (with activity)  Pain Descriptors / Indicators Sore;Guarding  Pain Intervention(s) Limited activity within patient's tolerance;Monitored during session;Repositioned  Cognition  Arousal Alert  Behavior During Therapy WFL for tasks assessed/performed  PT - Cognitive impairments No apparent impairments  Cueing  Cueing Techniques Verbal cues  Communication  Communication No apparent difficulties  Bed Mobility  Overal bed mobility Needs Assistance  Bed Mobility Sit to Supine;Rolling  Rolling Mod assist  Sit to supine Min assist;Mod assist  General bed mobility comments multi-modal cues for self assist, sequence, positioning in supine; incr time and effort. bed pad used to assist to scoot laterally and progress RLE on to bed  Transfers  Overall transfer level Needs assistance  Equipment used Rolling walker (2 wheels)  Transfers Sit to/from Stand;Bed to chair/wheelchair/BSC  Sit to Stand Mod assist  Bed to/from chair/wheelchair/BSC transfer type: Step pivot  Step pivot transfers Mod assist  General transfer comment Cues for R LE management and hand placement; mod A  to rise from recliner. assist to balance, progress RLE and maneuver RW for stand step  pivot. difficulty maintaining TDWB  Balance  Sitting-balance support Feet supported;No upper extremity supported  Sitting balance-Leahy Scale Good  Standing balance support Reliant on assistive device for balance;During functional activity  Standing balance-Leahy Scale Poor  Standing balance comment RW and min A;  Total Joint Exercises  Ankle Circles/Pumps AROM;Both;10 reps;Supine  Quad Sets AROM;Strengthening;Both  Heel Slides AAROM;5 reps;Both  PT - End of Session  Equipment Utilized During Treatment Gait belt  Activity Tolerance Patient limited by pain;Patient limited by fatigue  Patient left with call bell/phone within reach;in bed;with bed alarm set   PT - Assessment/Plan  PT Visit Diagnosis Other abnormalities of gait and mobility (R26.89);History of falling (Z91.81)  PT Frequency (ACUTE ONLY) 7X/week  Follow Up Recommendations Skilled nursing-short term rehab (<3 hours/day)  Patient can return home with the following A lot of help with walking and/or transfers;A lot of help with bathing/dressing/bathroom;Assistance with cooking/housework;Assist for transportation;Help with stairs or ramp for entrance  PT equipment Wheelchair (measurements PT)  AM-PAC PT "6 Clicks" Mobility Outcome Measure (Version 2)  Help needed turning from your back to your side while in a flat bed without using bedrails? 2  Help needed moving from lying on your back to sitting on the side of a flat bed without using bedrails? 2  Help needed moving to and from a bed to a chair (including a wheelchair)? 2  Help needed standing up from a chair using your arms (e.g., wheelchair or bedside chair)? 2  Help needed to walk in hospital room? 2  Help needed climbing 3-5 steps  with a railing?  1  6 Click Score 11  Consider Recommendation of Discharge To: CIR/SNF/LTACH  PT Goal Progression  Progress towards PT goals Progressing toward  goals  Acute Rehab PT Goals  PT Goal Formulation With patient/family  Time For Goal Achievement 09/21/23  Potential to Achieve Goals Good  PT Time Calculation  PT Start Time (ACUTE ONLY) 1441  PT Stop Time (ACUTE ONLY) 1506  PT Time Calculation (min) (ACUTE ONLY) 25 min  PT Treatments  $Gait Training 8-22 mins  $Therapeutic Exercise 8-22 mins

## 2023-09-11 NOTE — [place] FL2 (Signed)
 [place] MEDICAID FL2 LEVEL OF CARE FORM     IDENTIFICATION  Patient Name: Ariel Cobb Birthdate: Feb 03, 1946 Sex: female Admission Date (Current Location): 09/06/2023  [facility] and [place] Number:  Producer, television/film/video and Address:  [facility],  [address] [place], [place] [address]      Provider Number: [id number]  Attending Physician Name and Address:  Samson Frederic, MD  Relative Name and Phone Number:  [name] (Sister)  [phone] [facility])    Current Level of Care: Hospital Recommended Level of Care: Skilled Nursing Facility Prior Approval Number:    Date Approved/Denied:   PASRR Number: [id number]  Discharge Plan: SNF    Current Diagnoses: Patient Active Problem List   Diagnosis Date Noted   Malnutrition of moderate degree 09/09/2023   S/P total right hip arthroplasty 09/06/2023   Avascular necrosis of hip, right (HCC) 06/14/2023   Nicotine dependence, cigarettes, uncomplicated 08/06/2021   Closed right hip fracture, initial encounter (HCC) 08/05/2021   Anticoagulated 01/28/2019   COPD (chronic obstructive pulmonary disease) (HCC) 01/28/2019   Paroxysmal atrial fibrillation (HCC) 01/10/2019   Hypothyroidism    Type II diabetes mellitus (HCC)    Chronic kidney disease, stage 3b (HCC)    PAD (peripheral artery disease) (HCC)    Essential hypertension 05/06/2013   Mixed hyperlipidemia due to type 2 diabetes mellitus (HCC) 05/06/2013   Type 2 diabetes mellitus with stage 3b chronic kidney disease, with long-term current use of insulin (HCC) 05/06/2013   Tobacco abuse 05/06/2013   Claudication (HCC) 05/06/2013    Orientation RESPIRATION BLADDER Height & Weight     Self, Time, Situation, Place  Normal Continent Weight: 50.2 kg Height:  5\' 3"  (160 cm)  BEHAVIORAL SYMPTOMS/MOOD NEUROLOGICAL BOWEL NUTRITION STATUS      Continent Diet (Carb Modified)  AMBULATORY STATUS COMMUNICATION OF NEEDS Skin   Extensive Assist Verbally Normal                        Personal Care Assistance Level of Assistance  Bathing, Feeding, Dressing Bathing Assistance: Limited assistance Feeding assistance: Limited assistance Dressing Assistance: Limited assistance     Functional Limitations Info  Sight, Hearing, Speech Sight Info: Impaired (glasses) Hearing Info: Adequate Speech Info: Adequate    SPECIAL CARE FACTORS FREQUENCY  PT (By licensed PT), OT (By licensed OT)     PT Frequency: 5x/wk OT Frequency: 5x/wk            Contractures Contractures Info: Not present    Additional Factors Info  Code Status, Allergies, Psychotropic Code Status Info: Full Code Allergies Info: Adhesive (Tape), Amoxicillin, Latex, Pioglitazone Psychotropic Info: N/A         Current Medications (09/11/2023):  This is the current hospital active medication list Current Facility-Administered Medications  Medication Dose Route Frequency Provider Last Rate Last Admin   0.9 %  sodium chloride infusion   Intravenous Continuous Clois Dupes, PA-C 75 mL/hr at 09/10/23 1720 Infusion Verify at 09/10/23 1720   acetaminophen (TYLENOL) tablet 325-650 mg  325-650 mg Oral Q6H PRN Clois Dupes, PA-C   650 mg at 09/08/23 1412   albuterol (PROVENTIL) (2.5 MG/3ML) 0.083% nebulizer solution 2.5 mg  2.5 mg Nebulization Q6H PRN Samson Frederic, MD       alum & mag hydroxide-simeth (MAALOX/MYLANTA) 200-200-20 MG/5ML suspension 30 mL  30 mL Oral Q4H PRN Hill, Avery S, PA-C       apixaban (ELIQUIS) tablet 2.5 mg  2.5 mg Oral  BID Andrez Grime M, PA-C   2.5 mg at 09/11/23 2536   ascorbic acid (VITAMIN C) tablet 500 mg  500 mg Oral Q lunch Clois Dupes, [place]   500 mg at 09/11/23 1111   atorvastatin (LIPITOR) tablet 80 mg  80 mg Oral q morning Clois Dupes, [place]   80 mg at 09/11/23 6440   bisacodyl (DULCOLAX) suppository 10 mg  10 mg Rectal Daily PRN Clois Dupes, PA-C       clobetasol (TEMOVATE) 0.05 % external solution 1 Application  1 Application Topical BID  PRN Hill, Alain Honey, PA-C       diphenhydrAMINE (BENADRYL) 12.5 MG/5ML elixir 12.5-25 mg  12.5-25 mg Oral Q4H PRN Hill, Avery S, PA-C       docusate sodium (COLACE) capsule 100 mg  100 mg Oral BID Hill, Avery S, PA-C   100 mg at 09/11/23 3474   feeding supplement (ENSURE ENLIVE / ENSURE PLUS) liquid 237 mL  237 mL Oral BID BM Swinteck, Arlys John, MD   237 mL at 09/11/23 1425   ferrous sulfate tablet 325 mg  325 mg Oral Daily Clois Dupes, [place]   325 mg at 09/11/23 0818   guaiFENesin (MUCINEX) 12 hr tablet 600 mg  600 mg Oral BID Andrez Grime M, PA-C   600 mg at 09/11/23 2595   hydrochlorothiazide (HYDRODIURIL) tablet 25 mg  25 mg Oral Daily Clois Dupes, PA-C   25 mg at 09/11/23 0818   HYDROcodone-acetaminophen (NORCO) 7.5-325 MG per tablet 1-2 tablet  1-2 tablet Oral Q4H PRN Clois Dupes, PA-C   1 tablet at 09/11/23 1127   HYDROcodone-acetaminophen (NORCO/VICODIN) 5-325 MG per tablet 1-2 tablet  1-2 tablet Oral Q4H PRN Clois Dupes, PA-C   1 tablet at 09/11/23 0816   insulin aspart (novoLOG) injection 0-5 Units  0-5 Units Subcutaneous QHS Clois Dupes, PA-C   2 Units at 09/10/23 2240   insulin aspart (novoLOG) injection 0-9 Units  0-9 Units Subcutaneous TID WC HillAlain Honey, PA-C   5 Units at 09/11/23 1124   insulin glargine (LANTUS) injection 4 Units  4 Units Subcutaneous Daily Clois Dupes, [place]   4 Units at 09/11/23 6387   levothyroxine (SYNTHROID) tablet 75 mcg  75 mcg Oral Q0600 Clois Dupes, [place]   75 mcg at 09/11/23 5643   menthol-cetylpyridinium (CEPACOL) lozenge 3 mg  1 lozenge Oral PRN Clois Dupes, PA-C       Or   phenol (CHLORASEPTIC) mouth spray 1 spray  1 spray Mouth/Throat PRN Hill, Alain Honey, PA-C       methocarbamol (ROBAXIN) tablet 500 mg  500 mg Oral Q6H PRN Clois Dupes, PA-C   500 mg at 09/11/23 3295   Or   methocarbamol (ROBAXIN) injection 500 mg  500 mg Intravenous Q6H PRN Clois Dupes, PA-C   500 mg at 09/07/23 0020   metoCLOPramide (REGLAN) tablet 5-10 mg  5-10 mg  Oral Q8H PRN Clint Bolder S, PA-C       Or   metoCLOPramide (REGLAN) injection 5-10 mg  5-10 mg Intravenous Q8H PRN Clois Dupes, PA-C   10 mg at 09/07/23 0022   mometasone-formoterol (DULERA) 200-5 MCG/ACT inhaler 2 puff  2 puff Inhalation BID Clois Dupes, PA-C   2 puff at 09/11/23 0747   morphine (PF) 2 MG/ML injection 0.5-1 mg  0.5-1 mg Intravenous Q2H PRN Clois Dupes, PA-C       multivitamin with  minerals tablet 1 tablet  1 tablet Oral Daily Swinteck, Arlys John, MD   1 tablet at 09/11/23 0817   mupirocin ointment (BACTROBAN) 2 %   Nasal BID Clois Dupes, [place]   Given at 09/11/23 2130   ondansetron (ZOFRAN) tablet 4 mg  4 mg Oral Q6H PRN Clois Dupes, PA-C   4 mg at 09/08/23 1412   Or   ondansetron (ZOFRAN) injection 4 mg  4 mg Intravenous Q6H PRN Clois Dupes, PA-C   4 mg at 09/07/23 1513   pantoprazole (PROTONIX) EC tablet 40 mg  40 mg Oral Daily Clois Dupes, PA-C   40 mg at 09/11/23 0818   polyethylene glycol (MIRALAX / GLYCOLAX) packet 17 g  17 g Oral Daily PRN Clint Bolder S, PA-C       ramipril (ALTACE) capsule 2.5 mg  2.5 mg Oral Daily Clint Bolder S, PA-C   2.5 mg at 09/11/23 1353   senna (SENOKOT) tablet 8.6 mg  1 tablet Oral BID Clint Bolder S, PA-C   8.6 mg at 09/11/23 8657   zinc sulfate (50mg  elemental zinc) capsule 220 mg  220 mg Oral Q lunch Samson Frederic, MD   220 mg at 09/11/23 1111     Discharge Medications: Please see discharge summary for a list of discharge medications.  Relevant Imaging Results:  Relevant Lab Results:   Additional Information SSN: [id number]  Howell Rucks, RN

## 2023-09-11 NOTE — TOC Progression Note (Addendum)
 Transition of Care [facility]) - Progression Note    Patient Details  Name: Ariel Cobb MRN: [id number] Date of Birth: Nov 10, 1945  Transition [facility]) CM/SW Contact  Howell Rucks, RN Phone Number: 09/11/2023, 10:38 AM  Clinical Narrative:  Met with pt and her sister Steward Drone) at bedside today to review dc planning, Pt's sister reports she will not be able to care for pt at home with pt's currently state of mobility, pt confirmed she would not like to got to SNF. PT to  have session with pt today. NCM will update pt and sister.   -2:37pm. Teams chat received from PT to the team, including Ortho PA, PT session  today  3/17 -mod to max assist for bed mobility/tranfers today. NCM confirmed with Ortho PA, agreeable for patient to go to SNF. NCM met with pt at bedside and spoke with pt's sister Steward Drone) on the phone, agreeable to short term rehab at [facility],, prefers [facility], FL2 updated, faxed out for bed offers. TOC will continue to follow.      Barriers to Discharge: No Barriers Identified  Expected Discharge Plan and Services                         DME Arranged: N/A DME Agency: NA                   Social Determinants of Health (SDOH) Interventions SDOH Screenings   Food Insecurity: No Food Insecurity (09/07/2023)  Housing: Low Risk  (09/07/2023)  Transportation Needs: No Transportation Needs (09/07/2023)  Utilities: Not At Risk (09/07/2023)  Social Connections: Moderately Isolated (09/07/2023)  Tobacco Use: High Risk (09/06/2023)    Readmission Risk Interventions     No data to display

## 2023-09-11 NOTE — Progress Notes (Addendum)
    Subjective:  Patient reports pain as mild to moderate.  Denies N/V/CP/SOB/Abd pain currently.  She denies any tingling or numbness in LE bilaterally.  Reports soreness in right toes.  Purewick in place. Discussed with patient sister's concerns with d/c home. Patient reluctant to go to SNF due to prior poor experience.  We discussed barriers and concerns with d/c to home including compliance with TDWB status, current use of purewick and ability to toilet at home by herself, fatigue with ambulation.  She will consider options more closely today.  All questions solicited and answered and concerns addressed this morning.    Objective:   VITALS:   Vitals:   09/10/23 1700 09/10/23 2226 09/11/23 0532 09/11/23 0747  BP:  (!) 175/75 (!) 157/87   Pulse:  93 74   Resp:  16 18   Temp:  98.2 F (36.8 C) 98.3 F (36.8 C)   TempSrc:  Oral Oral   SpO2: 93% 92% 94% 96%  Weight:      Height:        NAD Neurologically intact ABD soft Neurovascular intact Sensation intact distally Intact pulses distally Dorsiflexion/Plantar flexion intact Incision: dressing C/D/I No cellulitis present Compartment soft Thigh edema today, soft and compressible.   Lab Results  Component Value Date   WBC 14.9 (H) 09/10/2023   HGB 9.8 (L) 09/10/2023   HCT 30.6 (L) 09/10/2023   MCV 95.9 09/10/2023   PLT 233 09/10/2023   BMET    Component Value Date/Time   NA 133 (L) 09/08/2023 0319   NA 142 01/30/2023 1001   K 5.0 09/08/2023 0319   CL 100 09/08/2023 0319   CO2 24 09/08/2023 0319   GLUCOSE 239 (H) 09/08/2023 0319   BUN 45 (H) 09/08/2023 0319   BUN 31 (H) 01/30/2023 1001   CREATININE 1.91 (H) 09/08/2023 0319   CREATININE 1.42 (H) 07/24/2013 1236   CALCIUM 8.1 (L) 09/08/2023 0319   EGFR 42 (L) 01/30/2023 1001   GFRNONAA 26 (L) 09/08/2023 0319     Assessment/Plan: 5 Days Post-Op   Principal Problem:   S/P total right hip arthroplasty Active Problems:   Avascular necrosis of hip,  right (HCC)   Malnutrition of moderate degree  ABLA. Hemoglobin 9.8 yesterday s/p transfusion Friday. Continue to monitor. Eliquis resumed.   HTN. Resumed ACE today.   CKD history. Recheck BMP.    Touchdown weightbearing RLE with walker DVT ppx:  low dose Eliquis SCDs, TEDS PO pain control PT/OT: She did okay with TDWB yesterday. Fatigues easily. Likely SNF recommendation. Continue PT today.  Dispo:  - Patient has baseline orthostatic hypotension and dizziness. We will see how she does with PT and with weightbearing restrictions.  - Plan for d/c home if able to maintain TDWB restrictions and self care at home. Patient reluctant about SNF. Sister concerned with return home. Will continue to monitor and discuss options pending progression. Discussed today possibility for SNF, patient will consider options today.    Clois Dupes 09/11/2023, 12:35 PM   [facility]  [place] [address], [place], [place] [address] Phone: [phone] [email or web address] Facebook  [employer]

## 2023-09-12 LAB — BASIC METABOLIC PANEL
Anion gap: 12 (ref 5–15)
BUN: 39 mg/dL — ABNORMAL HIGH (ref 8–23)
CO2: 25 mmol/L (ref 22–32)
Calcium: 8.4 mg/dL — ABNORMAL LOW (ref 8.9–10.3)
Chloride: 100 mmol/L (ref 98–111)
Creatinine, Ser: 1.16 mg/dL — ABNORMAL HIGH (ref 0.44–1.00)
GFR, Estimated: 48 mL/min — ABNORMAL LOW (ref >60)
Glucose, Bld: 243 mg/dL — ABNORMAL HIGH (ref 70–99)
Potassium: 3.7 mmol/L (ref 3.5–5.1)
Sodium: 137 mmol/L (ref 135–145)

## 2023-09-12 LAB — CBC
HCT: 32.6 % — ABNORMAL LOW (ref 36.0–46.0)
Hemoglobin: 10.3 g/dL — ABNORMAL LOW (ref 12.0–15.0)
MCH: 30.4 pg (ref 26.0–34.0)
MCHC: 31.6 g/dL (ref 30.0–36.0)
MCV: 96.2 fL (ref 80.0–100.0)
Platelets: 311 10*3/uL (ref 150–400)
RBC: 3.39 MIL/uL — ABNORMAL LOW (ref 3.87–5.11)
RDW: 16.8 % — ABNORMAL HIGH (ref 11.5–15.5)
WBC: 12.8 10*3/uL — ABNORMAL HIGH (ref 4.0–10.5)
nRBC: 0 % (ref 0.0–0.2)

## 2023-09-12 LAB — GLUCOSE, CAPILLARY
Glucose-Capillary: 278 mg/dL — ABNORMAL HIGH (ref 70–99)
Glucose-Capillary: 298 mg/dL — ABNORMAL HIGH (ref 70–99)
Glucose-Capillary: 338 mg/dL — ABNORMAL HIGH (ref 70–99)
Glucose-Capillary: 253 mg/dL — ABNORMAL HIGH (ref 70–99)

## 2023-09-12 MED ORDER — INSULIN ASPART 100 UNIT/ML IJ SOLN
2.0000 [IU] | Freq: Three times a day (TID) | INTRAMUSCULAR | Status: DC
  Administered 2023-09-12 – 2023-09-13 (×3): 2 [IU] via SUBCUTANEOUS

## 2023-09-12 MED ORDER — INSULIN GLARGINE 100 UNIT/ML ~~LOC~~ SOLN
5.0000 [IU] | Freq: Every day | SUBCUTANEOUS | Status: DC
  Administered 2023-09-13: 5 [IU] via SUBCUTANEOUS
  Filled 2023-09-12: qty 0.05

## 2023-09-12 NOTE — Inpatient Diabetes Management (Signed)
 Inpatient Diabetes Program Recommendations  AACE/ADA: New Consensus Statement on Inpatient Glycemic Control (2015)  Target Ranges:  Prepandial:   less than 140 mg/dL      Peak postprandial:   less than 180 mg/dL (1-2 hours)      Critically ill patients:  140 - 180 mg/dL   Lab Results  Component Value Date   GLUCAP 278 (H) 09/12/2023   HGBA1C 5.4 09/01/2023    Review of Glycemic Control  Diabetes history: T1DM Outpatient Diabetes medications: Basaglar 8 daily, Novolog 3-7 units TID Current orders for Inpatient glycemic control: Lantus 4 units daily, Novolog 0-9 TID with meals and 0-5 HS  Hgba1C - 5.4% 278 mg/dL this am Endo - Dr Sharl Ma  Inpatient Diabetes Program Recommendations:    Increase Lantus to 5 units daily  Consider adding Novolog 2 units TID with meals if eating > 50%  Met with pt at bedside to discuss her diabetes and insulin. Pt a little anxious at time. Will speak with her at later time. Discussed with RN.   Thank you. Ailene Ards, RD, LDN, CDCES Inpatient Diabetes Coordinator [phone]

## 2023-09-12 NOTE — Progress Notes (Signed)
 Physical Therapy Treatment Patient Details Name: Ariel Cobb MRN: [id number] DOB: [birth year] Today's Date: 09/12/2023   History of Present Illness 78 yo female  s/p Conversion of previous surgery to Right total hip arthroplasty, anterior approach.     PMH: arthritis, HTN, HLD, PAD,  R hip fx-s/p IM nail 2023, s/p removal of hardware 06/13/24, AVN R hip    PT Comments  POD # 6 PT - Cognition Comments: AxO x 3 pleasant Lady but required repeat instruction as Pt appears "undecided" and present with difficulty making a decision.  Requires redirection to task at hand. Assisted OOB required increased time.  General bed mobility comments: Attempted to demonstrate and educate how to use a belt to assist guide LE off bed. General transfer comment: First assisted from bed to [facility] then from [facility] to EVA walker to attempt. General Gait Details: Very limited amb distance using EVA walker with great difficulty advancing either LE.  Also difficulty adhering to TTWB. Positioned in reclier to comfort.   Pt will need ST Rehab at SNF to address mobility and functional decline prior to safely returning home.    If plan is discharge home, recommend the following: A lot of help with walking and/or transfers;A lot of help with bathing/dressing/bathroom;Assistance with cooking/housework;Assist for transportation;Help with stairs or ramp for entrance   Can travel by private vehicle        Equipment Recommendations       Recommendations for Other Services       Precautions / Restrictions Precautions Precautions: Fall Recall of Precautions/Restrictions: Intact Restrictions Weight Bearing Restrictions Per Provider Order: Yes RLE Weight Bearing Per Provider Order: Touchdown weight bearing     Mobility  Bed Mobility Overal bed mobility: Needs Assistance Bed Mobility: Supine to Sit     Supine to sit: Min assist, Mod assist     General bed mobility comments: Attempted to demonstrate and educate how to  use a belt to assist guide LE off bed.    Transfers Overall transfer level: Needs assistance Equipment used: Rolling walker (2 wheels) Transfers: Sit to/from Stand, Bed to chair/wheelchair/BSC Sit to Stand: Mod assist, +2 physical assistance, +2 safety/equipment Stand pivot transfers: Mod assist         General transfer comment: First assisted from bed to [facility] then from [facility] to EVA walker to attempt.    Ambulation/Gait Ambulation/Gait assistance: Mod assist, +2 physical assistance, +2 safety/equipment Gait Distance (Feet): 3 Feet Assistive device: Bilateral platform walker, Fara Boros Gait Pattern/deviations: Step-to pattern, Decreased stride length Gait velocity: decreased     General Gait Details: Very limited amb distance using EVA walker with great difficulty advancing either LE.  Also difficulty adhering to TTWB.   Stairs             Wheelchair Mobility     Tilt Bed    Modified Rankin (Stroke Patients Only)       Balance                                            Communication Communication Communication: No apparent difficulties  Cognition Arousal: Alert Behavior During Therapy: WFL for tasks assessed/performed   PT - Cognitive impairments: Problem solving, Safety/Judgement                       PT - Cognition Comments: AxO x 3 pleasant [facility]  but required repeat instruction as Pt appears "undecided" and present with difficulty making a decision.  Requires redirection to task at hand. Following commands: Intact      Cueing Cueing Techniques: Verbal cues  Exercises      General Comments        Pertinent Vitals/Pain Pain Assessment Pain Assessment: Faces Faces Pain Scale: Hurts even more Pain Location: R hip (with activity) Pain Descriptors / Indicators: Sore, Guarding, Numbness, Grimacing Pain Intervention(s): Monitored during session, Premedicated before session, Repositioned, Ice applied    Home Living                           Prior Function            PT Goals (current goals can now be found in the care plan section) Progress towards PT goals: Progressing toward goals    Frequency    7X/week      PT Plan      Co-evaluation              AM-PAC PT "6 Clicks" Mobility   Outcome Measure  Help needed turning from your back to your side while in a flat bed without using bedrails?: A Lot Help needed moving from lying on your back to sitting on the side of a flat bed without using bedrails?: A Lot Help needed moving to and from a bed to a chair (including a wheelchair)?: A Lot Help needed standing up from a chair using your arms (e.g., wheelchair or bedside chair)?: A Lot Help needed to walk in hospital room?: Total Help needed climbing 3-5 steps with a railing? : Total 6 Click Score: 10    End of Session Equipment Utilized During Treatment: Gait belt   Patient left: with call bell/phone within reach;with bed alarm set;in chair Nurse Communication: Mobility status PT Visit Diagnosis: Other abnormalities of gait and mobility (R26.89);History of falling (Z91.81)     Time: 1610-9604 PT Time Calculation (min) (ACUTE ONLY): 33 min  Charges:    $Gait Training: 8-22 mins $Therapeutic Activity: 8-22 mins PT General Charges $$ ACUTE PT VISIT: 1 Visit                     Felecia Shelling  PTA Acute  Rehabilitation Services Office M-F          [phone]

## 2023-09-12 NOTE — Progress Notes (Signed)
 Patient called writer to the room to look at skin tear to LFA near IV tape site. Patient reports it happened when she was attempting to get up to use the [facility]. Area cleansed and covered with foam.

## 2023-09-12 NOTE — Progress Notes (Signed)
    Subjective:  Patient reports pain as mild to moderate.  Denies N/V/CP/SOB/Abd pain. Patient reports she is uncomfortable.  Readjusted today addressed concerns.  Patient agreeable to SNF.    Objective:   VITALS:   Vitals:   09/11/23 1814 09/11/23 2112 09/12/23 0629 09/12/23 0827  BP:  (!) 164/78 (!) 169/76   Pulse:  77 74   Resp:  17 17   Temp:  98.2 F (36.8 C) 97.8 F (36.6 C)   TempSrc:  Oral Oral   SpO2: 93% 100% 95% 94%  Weight:      Height:        NAD Neurologically intact ABD soft Neurovascular intact Sensation intact distally Intact pulses distally Dorsiflexion/Plantar flexion intact Incision: scant drainage No cellulitis present Compartment soft Thigh edema soft and compressible.  New Aquacel placed today.   Lab Results  Component Value Date   WBC 12.8 (H) 09/12/2023   HGB 10.3 (L) 09/12/2023   HCT 32.6 (L) 09/12/2023   MCV 96.2 09/12/2023   PLT 311 09/12/2023   BMET    Component Value Date/Time   NA 137 09/12/2023 0335   NA 142 01/30/2023 1001   K 3.7 09/12/2023 0335   CL 100 09/12/2023 0335   CO2 25 09/12/2023 0335   GLUCOSE 243 (H) 09/12/2023 0335   BUN 39 (H) 09/12/2023 0335   BUN 31 (H) 01/30/2023 1001   CREATININE 1.16 (H) 09/12/2023 0335   CREATININE 1.42 (H) 07/24/2013 1236   CALCIUM 8.4 (L) 09/12/2023 0335   EGFR 42 (L) 01/30/2023 1001   GFRNONAA 48 (L) 09/12/2023 0335     Assessment/Plan: 6 Days Post-Op   Principal Problem:   S/P total right hip arthroplasty Active Problems:   Avascular necrosis of hip, right (HCC)   Malnutrition of moderate degree   ABLA. Hemoglobin 10.3. Continue to monitor. Eliquis resumed.    HTN. Resumed ACE. .    CKD history. Cr. 1.16 today (1.35 yesterday). Continue to monitor.    Touchdown weightbearing RLE with walker DVT ppx:  low dose Eliquis SCDs, TEDS PO pain control PT/OT: She did okay with TDWB. Fatigues easily. SNF recommendation.  Dispo:  - Patient has baseline orthostatic  hypotension and dizziness.Continue PT today. - Patient reluctant about SNF but agreeable to SNF due to TDWB restrictions and self care at home. Disposition pending placement.  - Will reach out to diabetes coordinator today about increased blood glucose with goal <200 for wound healing.    Clois Dupes 09/12/2023, 12:30 PM   [facility]  [place] [address], [address], [place], [place] [address] Phone: [phone] [email or web address] Facebook  [employer]

## 2023-09-12 NOTE — TOC Progression Note (Addendum)
 Transition of Care [facility]) - Progression Note    Patient Details  Name: AAYAT HAJJAR MRN: [id number] Date of Birth: 09-19-1945  Transition of Care [facility]) CM/SW Contact  Howell Rucks, RN Phone Number: 09/12/2023, 1:57 PM  Clinical Narrative:  Short term rehab/SNF bed offers reviewed with pt and her sister, Steward Drone (  [facility], [facility], [facility], [address], [place], [facility]), pt's sister accepted bed offer at [facility]. Insurance auth initiated via telephone to [employer]( Availity protal down). Ref# [id number]. Clinical faxed to [phone], auth pending.        Barriers to Discharge: No Barriers Identified  Expected Discharge Plan and Services                         DME Arranged: N/A DME Agency: NA                   Social Determinants of Health (SDOH) Interventions SDOH Screenings   Food Insecurity: No Food Insecurity (09/07/2023)  Housing: Low Risk  (09/07/2023)  Transportation Needs: No Transportation Needs (09/07/2023)  Utilities: Not At Risk (09/07/2023)  Social Connections: Moderately Isolated (09/07/2023)  Tobacco Use: High Risk (09/06/2023)    Readmission Risk Interventions    09/11/2023    2:29 PM  Readmission Risk Prevention Plan  Post Dischage Appt Complete  Medication Screening Complete  Transportation Screening Complete

## 2023-09-13 DIAGNOSIS — Z96641 Presence of right artificial hip joint: Secondary | ICD-10-CM

## 2023-09-13 LAB — BASIC METABOLIC PANEL
Anion gap: 10 (ref 5–15)
BUN: 43 mg/dL — ABNORMAL HIGH (ref 8–23)
CO2: 25 mmol/L (ref 22–32)
Calcium: 7.9 mg/dL — ABNORMAL LOW (ref 8.9–10.3)
Chloride: 99 mmol/L (ref 98–111)
Creatinine, Ser: 1.41 mg/dL — ABNORMAL HIGH (ref 0.44–1.00)
GFR, Estimated: 38 mL/min — ABNORMAL LOW (ref >60)
Glucose, Bld: 230 mg/dL — ABNORMAL HIGH (ref 70–99)
Potassium: 3.9 mmol/L (ref 3.5–5.1)
Sodium: 134 mmol/L — ABNORMAL LOW (ref 135–145)

## 2023-09-13 LAB — CBC
HCT: 28 % — ABNORMAL LOW (ref 36.0–46.0)
Hemoglobin: 9 g/dL — ABNORMAL LOW (ref 12.0–15.0)
MCH: 30.8 pg (ref 26.0–34.0)
MCHC: 32.1 g/dL (ref 30.0–36.0)
MCV: 95.9 fL (ref 80.0–100.0)
Platelets: 319 10*3/uL (ref 150–400)
RBC: 2.92 MIL/uL — ABNORMAL LOW (ref 3.87–5.11)
RDW: 16.5 % — ABNORMAL HIGH (ref 11.5–15.5)
WBC: 10.4 10*3/uL (ref 4.0–10.5)
nRBC: 0 % (ref 0.0–0.2)

## 2023-09-13 LAB — GLUCOSE, CAPILLARY
Glucose-Capillary: 241 mg/dL — ABNORMAL HIGH (ref 70–99)
Glucose-Capillary: 397 mg/dL — ABNORMAL HIGH (ref 70–99)
Glucose-Capillary: 229 mg/dL — ABNORMAL HIGH (ref 70–99)
Glucose-Capillary: 194 mg/dL — ABNORMAL HIGH (ref 70–99)
Glucose-Capillary: 152 mg/dL — ABNORMAL HIGH (ref 70–99)

## 2023-09-13 MED ORDER — INSULIN ASPART 100 UNIT/ML IJ SOLN
4.0000 [IU] | Freq: Three times a day (TID) | INTRAMUSCULAR | Status: DC
  Administered 2023-09-13 – 2023-09-14 (×4): 4 [IU] via SUBCUTANEOUS

## 2023-09-13 MED ORDER — POLYETHYLENE GLYCOL 3350 17 G PO PACK: 17.0000 g | PACK | Freq: Every day | ORAL | 0 refills | Status: AC | PRN

## 2023-09-13 MED ORDER — SENNA 8.6 MG PO TABS: 2.0000 | ORAL_TABLET | Freq: Every day | ORAL | 0 refills | Status: AC

## 2023-09-13 MED ORDER — INSULIN GLARGINE 100 UNIT/ML ~~LOC~~ SOLN
10.0000 [IU] | Freq: Every day | SUBCUTANEOUS | Status: DC
  Administered 2023-09-14: 10 [IU] via SUBCUTANEOUS
  Filled 2023-09-13 (×2): qty 0.1

## 2023-09-13 MED ORDER — DOCUSATE SODIUM 100 MG PO CAPS: 100.0000 mg | ORAL_CAPSULE | Freq: Two times a day (BID) | ORAL | 0 refills | Status: AC

## 2023-09-13 MED ORDER — HYDROCODONE-ACETAMINOPHEN 5-325 MG PO TABS: 1.0000 | ORAL_TABLET | ORAL | 0 refills | Status: AC | PRN

## 2023-09-13 NOTE — Progress Notes (Signed)
 Occupational Therapy Treatment Patient Details Name: Ariel Cobb MRN: [id number] DOB: 26-Feb-1946 Today's Date: 09/13/2023   History of present illness 78 yo female  s/p Conversion of previous surgery to Right total hip arthroplasty, anterior approach.     PMH: arthritis, HTN, HLD, PAD,  R hip fx-s/p IM nail 2023, s/p removal of hardware 06/13/24, AVN R hip   OT comments  Pt making slow progress with functional goals. Pt's sister present. Pt seated EOB upon OT arrival with nursing staff preparing to assist her to [facility], however OT continued with assisting pt wth NT as well. Pt required mod A +2 for sit - stand/SPT to [facility] with difficulty maintaining R LE TDWB. Pt required max A with clothing mgt and Sup with anterior hygiene seated on BSC. Pt washed hands and face with Sup, UB dressing to don clean gown CGA. Max A SPT BSC to recliner. Pt required momentum to power up from EOB and BSC with min verbal cues for hand/foot placement throughout during functional mobility with RW. OT will continue to follow acutely to maximize level of function and safety      If plan is discharge home, recommend the following:  A lot of help with bathing/dressing/bathroom;Assist for transportation;Help with stairs or ramp for entrance;Assistance with cooking/housework;Two people to help with walking and/or transfers   Equipment Recommendations  Tub/shower bench    Recommendations for Other Services      Precautions / Restrictions Precautions Precautions: Fall Recall of Precautions/Restrictions: Intact Restrictions Weight Bearing Restrictions Per Provider Order: Yes RLE Weight Bearing Per Provider Order: Touchdown weight bearing       Mobility Bed Mobility               General bed mobility comments: pt sitting EOB with nurisng staff present upon arrival    Transfers Overall transfer level: Needs assistance Equipment used: Rolling walker (2 wheels) Transfers: Sit to/from Stand, Bed to  chair/wheelchair/BSC Sit to Stand: Mod assist, +2 physical assistance Stand pivot transfers: Mod assist, +2 physical assistance               Balance Overall balance assessment: Needs assistance Sitting-balance support: Feet supported, No upper extremity supported Sitting balance-Leahy Scale: Good     Standing balance support: Reliant on assistive device for balance, During functional activity Standing balance-Leahy Scale: Poor                             ADL either performed or assessed with clinical judgement   ADL Overall ADL's : Needs assistance/impaired     Grooming: Wash/dry hands;Wash/dry face;Supervision/safety;Sitting           Upper Body Dressing : Contact guard assist;Sitting       Toilet Transfer: Moderate assistance;+2 for physical assistance;+2 for safety/equipment;Rolling walker (2 wheels);BSC/3in1;Cueing for safety   Toileting- Clothing Manipulation and Hygiene: Maximal assistance;Sitting/lateral lean       Functional mobility during ADLs: Moderate assistance;+2 for physical assistance;+2 for safety/equipment;Cueing for safety;Cueing for sequencing;Rolling walker (2 wheels) General ADL Comments: max A to maintain TDWB    Extremity/Trunk Assessment Upper Extremity Assessment Upper Extremity Assessment: Generalized weakness   Lower Extremity Assessment Lower Extremity Assessment: Defer to PT evaluation        Vision Baseline Vision/History: 1 Wears glasses Ability to See in Adequate Light: 0 Adequate Patient Visual Report: No change from baseline     Perception     Praxis     Communication Communication  Communication: No apparent difficulties   Cognition Arousal: Alert Behavior During Therapy: WFL for tasks assessed/performed Cognition: No apparent impairments                               Following commands: Intact        Cueing   Cueing Techniques: Verbal cues  Exercises      Shoulder Instructions        General Comments      Pertinent Vitals/ Pain       Pain Assessment Pain Assessment: Faces Faces Pain Scale: Hurts even more Pain Location: R hip (with activity) Pain Descriptors / Indicators: Sore, Guarding, Numbness, Grimacing Pain Intervention(s): Limited activity within patient's tolerance, Monitored during session, Repositioned  Home Living Family/patient expects to be discharged to:: Private residence Living Arrangements: Other relatives Available Help at Discharge: Family Type of Home: House Home Access: Stairs to enter Secretary/administrator of Steps: 2   Home Layout: One level     Bathroom Shower/Tub: Chief Strategy Officer: Standard     Home Equipment: Cane - single Information systems manager (2 wheels)   Additional Comments: may be able to borrow w/c      Prior Functioning/Environment              Frequency  Min 1X/week        Progress Toward Goals  OT Goals(current goals can now be found in the care plan section)     Acute Rehab OT Goals Patient Stated Goal: get better OT Goal Formulation: With patient/family  Plan      Co-evaluation                 AM-PAC OT "6 Clicks" Daily Activity     Outcome Measure   Help from another person eating meals?: None Help from another person taking care of personal grooming?: A Little Help from another person toileting, which includes using toliet, bedpan, or urinal?: A Lot Help from another person bathing (including washing, rinsing, drying)?: A Lot Help from another person to put on and taking off regular upper body clothing?: A Little Help from another person to put on and taking off regular lower body clothing?: A Lot 6 Click Score: 16    End of Session Equipment Utilized During Treatment: Gait belt;Rolling walker (2 wheels);Other (comment) (BSC)  OT Visit Diagnosis: Unsteadiness on feet (R26.81);Other abnormalities of gait and mobility (R26.89);Muscle weakness  (generalized) (M62.81);Pain Pain - Right/Left: Right Pain - part of body: Hip   Activity Tolerance Patient limited by fatigue;Patient limited by pain   Patient Left in chair;with call bell/phone within reach;with chair alarm set;with family/visitor present   Nurse Communication          Time: 1610-9604 OT Time Calculation (min): 25 min  Charges: OT General Charges $OT Visit: 1 Visit OT Treatments $Self Care/Home Management : 8-22 mins $Therapeutic Activity: 8-22 mins   Galen Manila 09/13/2023, 1:51 PM

## 2023-09-13 NOTE — Progress Notes (Signed)
    Subjective:  Patient reports pain as moderate.  Denies N/V/CP/SOB/Abd pain.  Patient reports she is uncomfortable.  Readjusted again today addressed concerns.  Discussed continued adjustment for blood glucose under 200.  Possible placement [facility].   Objective:   VITALS:   Vitals:   09/12/23 1357 09/12/23 1932 09/12/23 2127 09/13/23 0557  BP: (!) 129/51  (!) 156/54 (!) 150/55  Pulse: 73  78 69  Resp: 16  15 16   Temp: 97.7 F (36.5 C)  98.4 F (36.9 C) 98.6 F (37 C)  TempSrc:   Oral Oral  SpO2: 98% 92% 94% 92%  Weight:      Height:        NAD Neurologically intact ABD soft Neurovascular intact Sensation intact distally Intact pulses distally Dorsiflexion/Plantar flexion intact Incision: scant drainage No cellulitis present Compartment soft Thigh edema present, improved since yesterday. Soft and compressible.   Lab Results  Component Value Date   WBC 10.4 09/13/2023   HGB 9.0 (L) 09/13/2023   HCT 28.0 (L) 09/13/2023   MCV 95.9 09/13/2023   PLT 319 09/13/2023   BMET    Component Value Date/Time   NA 134 (L) 09/13/2023 0347   NA 142 01/30/2023 1001   K 3.9 09/13/2023 0347   CL 99 09/13/2023 0347   CO2 25 09/13/2023 0347   GLUCOSE 230 (H) 09/13/2023 0347   BUN 43 (H) 09/13/2023 0347   BUN 31 (H) 01/30/2023 1001   CREATININE 1.41 (H) 09/13/2023 0347   CREATININE 1.42 (H) 07/24/2013 1236   CALCIUM 7.9 (L) 09/13/2023 0347   EGFR 42 (L) 01/30/2023 1001   GFRNONAA 38 (L) 09/13/2023 0347     Assessment/Plan: 7 Days Post-Op   Principal Problem:   S/P total right hip arthroplasty Active Problems:   Avascular necrosis of hip, right (HCC)   Malnutrition of moderate degree   ABLA. Hemoglobin 9.0. Continue to monitor.    CKD history. Cr. 1.41 today (1.16 yesterday). Continue to monitor.    Touchdown weightbearing RLE with walker DVT ppx:  low dose Eliquis SCDs, TEDS PO pain control PT/OT: She did okay with TDWB. Fatigues easily. SNF  recommendation.  Dispo:  - Patient has baseline orthostatic hypotension and dizziness.Continue PT today. - Patient reluctant about SNF but agreeable to SNF due to TDWB restrictions and self care at home. Disposition pending placement.  - Changed insulin yesterday per diabetes coordinator. Will continue to monitor. Goal blood glucose <200 for wound healing.    Clois Dupes 09/13/2023, 7:58 AM   [facility]  [place] [address], [address], [place], [place] [address] Phone: [phone] [email or web address] Facebook  [employer]

## 2023-09-13 NOTE — Consult Note (Signed)
 [facility] Medical Consultation  NADA GODLEY GMW:[id number] DOB: 07-Feb-1946 DOA: 09/06/2023 PCP: Renford Dills, MD   Requesting physician: Priscille Loveless, PA  Date of consultation: 09/13/2023  Reason for consultation: Diabetes Management.   Impression/Recommendations Principal Problem:   S/P total right hip arthroplasty Active Problems:   Avascular necrosis of hip, right (HCC)   Malnutrition of moderate degree  Type 2 diabetes: Patient takes Basaglar 8 units daily and 5 to 7 units of short acting insulin with meals at home. Hemoglobin A1c 5.4 consistent with excellent control. Blood glucose has been up could be due to stress response. Agree with the diabetic coordinator's recommendation. Increase Lantus 10 units daily Consider NovoLog 4 units 3 times daily with meals. Continue regular insulin sliding scale. Continue to monitor blood glucose. Discontinue hydrochlorothiazide which sometimes causes hyperglycemia.  Status post total right hip arthroplasty: Continue management as per orthopedics. Adequate pain control. Awaiting SNF placement.  Hypertension: Continue ramipril 2.5 mg daily. Discontinue hydrochlorothiazide If needed add amlodipine 5 mg daily.  Hyperlipidemia: Continue Lipitor 80 mg daily.  Paroxysmal A-fib: Heart rate is well-controlled. Continue Eliquis.  Hypothyroidism: Continue levothyroxine.  CKD stage IIIb: Serum creatinine at baseline.  Avoid nephrotoxic medications.  I will followup again tomorrow. Please contact me if I can be of assistance in the meanwhile. Thank you for this consultation.  Chief Complaint: Diabetic management.  HPI: This 78 years old female with PMH significant for tobacco abuse, COPD, paroxysmal A-fib on Eliquis, hypothyroidism, type 2 diabetes, CKD stage IIIb, peripheral artery disease, essential hypertension, hyperlipidemia, tobacco abuse, admitted under orthopedic service for elective right hip total  arthroplasty.  Today is postoperative day 7 for right hip arthroplasty.  Patient is awaiting SNF placement.  TR[facility] is consulted for diabetic management.  Review of Systems:  Review of Systems  Constitutional: Negative.   HENT: Negative.    Respiratory: Negative.    Cardiovascular: Negative.   Gastrointestinal: Negative.   Genitourinary: Negative.   Skin: Negative.   Neurological:  Positive for dizziness and weakness.  Endo/Heme/Allergies: Negative.   Psychiatric/Behavioral:  The patient is nervous/anxious.      Past Medical History:  Diagnosis Date   A-fib (HCC)    Anemia    Arthritis    Chronic bronchitis (HCC)    Chronic kidney disease    sees dr sanford yearly stage 3 stable   Claudication (HCC)    Complication of anesthesia 2000   slow to awaken after hip surgery   COPD (chronic obstructive pulmonary disease) (HCC)    Depression    Diabetes mellitus without complication (HCC)    type 1   Dysrhythmia    Hyperlipidemia    Hypertension    Hypothyroidism    Impetigo    Neuromuscular disorder (HCC)    peripheral neuropathy   Osteoporosis    PAD (peripheral artery disease) (HCC)    a. 07/29/13: s/p PV angiogram with successful diamondback orbital rotational atherectomy of the high-grade calcified R popliteal artery stenosis   Shortness of breath    on exertion   Thyroid disease    Past Surgical History:  Procedure Laterality Date   ABDOMINAL AORTAGRAM  06/04/2013   Procedure: ABDOMINAL AORTAGRAM;  Surgeon: Runell Gess, MD;  Location: [facility] CATH LAB;  Service: Cardiovascular;;   ATHERECTOMY Right 07/29/2013   popliteal/notes 07/29/2013   CATARACT EXTRACTION, BILATERAL Bilateral    CHOLECYSTECTOMY  1980's?   COLONOSCOPY WITH PROPOFOL N/A 09/06/2016   Procedure: COLONOSCOPY WITH PROPOFOL;  Surgeon: Charolett Bumpers, MD;  Location: [facility] ENDOSCOPY;  Service: Endoscopy;  Laterality: N/A;   cyst from right hand     ESOPHAGOGASTRODUODENOSCOPY (EGD) WITH PROPOFOL N/A 09/06/2016    Procedure: ESOPHAGOGASTRODUODENOSCOPY (EGD) WITH PROPOFOL;  Surgeon: Charolett Bumpers, MD;  Location: [facility] ENDOSCOPY;  Service: Endoscopy;  Laterality: N/A;   HARDWARE REMOVAL Right 06/14/2023   Procedure: HARDWARE REMOVAL;  Surgeon: Samson Frederic, MD;  Location: [facility] ORS;  Service: Orthopedics;  Laterality: Right;   HIP FRACTURE SURGERY Left 2000's   "I broke the ball; dr broke femur during OR; got a rod in there" 92/07/2013)   INTRAMEDULLARY (IM) NAIL INTERTROCHANTERIC Right 08/06/2021   Procedure: INTRAMEDULLARY (IM) NAIL INTERTROCHANTRIC;  Surgeon: Roby Lofts, MD;  Location: [facility] OR;  Service: Orthopedics;  Laterality: Right;   LOWER EXTREMITY ANGIOGRAM N/A 06/04/2013   Procedure: LOWER EXTREMITY ANGIOGRAM;  Surgeon: Runell Gess, MD;  Location: [facility] CATH LAB;  Service: Cardiovascular;  Laterality: N/A;   TOTAL HIP ARTHROPLASTY Right 09/06/2023   Procedure: ARTHROPLASTY, HIP, TOTAL, ANTERIOR APPROACH;  Surgeon: Samson Frederic, MD;  Location: [facility] ORS;  Service: Orthopedics;  Laterality: Right;   Social History:  reports that she has been smoking cigarettes. She has a 50 pack-year smoking history. She has never used smokeless tobacco. She reports that she does not drink alcohol and does not use drugs.  Allergies  Allergen Reactions   Adhesive [Tape] Itching    Surgical Tape - "makes me itch and break me out" -- HAS TO USE PAPER TAPE   Amoxicillin Hives, Itching and Swelling   Latex Itching   Pioglitazone Other (See Comments)    Unknown reaction    Family History  Problem Relation Age of Onset   Heart disease Father     Prior to Admission medications   Medication Sig Start Date End Date Taking? Authorizing Provider  acetaminophen (TYLENOL) 650 MG CR tablet Take 650 mg by mouth at bedtime as needed for pain.   Yes [provider]  albuterol (VENTOLIN HFA) 108 (90 Base) MCG/ACT inhaler Inhale 2 puffs into the lungs every 6 (six) hours as needed for wheezing or shortness of  breath.   Yes [provider]  ascorbic acid (VITAMIN C) 500 MG tablet Take 500 mg by mouth daily with lunch.   Yes [provider]  atorvastatin (LIPITOR) 80 MG tablet TAKE 1 TABLET BY MOUTH EVERY MORNING. 02/28/23  Yes Runell Gess, MD  bismuth subsalicylate (PEPTO BISMOL) 262 MG/15ML suspension Take 30 mLs by mouth every 6 (six) hours as needed for diarrhea or loose stools or indigestion.   Yes [provider]  budesonide-formoterol (SYMBICORT) 160-4.5 MCG/ACT inhaler Inhale 2 puffs into the lungs 2 (two) times daily.   Yes [provider]  chlorhexidine (HIBICLENS) 4 % external liquid Apply 15 mLs (1 Application total) topically as directed for 30 doses. Use as directed daily for 5 days every other week for 6 weeks. 09/06/23  Yes Hill, Alain Honey, PA-C  clobetasol (TEMOVATE) 0.05 % external solution Apply 1 Application topically 2 (two) times daily as needed (psoriasis).   Yes [provider]  docusate sodium (COLACE) 100 MG capsule Take 1 capsule (100 mg total) by mouth 2 (two) times daily. 09/13/23 10/13/23 Yes Hill, Alain Honey, PA-C  ELIQUIS 2.5 MG TABS tablet TAKE 1 TABLET BY MOUTH TWICE A DAY 06/02/23  Yes Runell Gess, MD  ferrous sulfate 325 (65 FE) MG tablet Take 325 mg by mouth daily.   Yes [provider]  hydrochlorothiazide (  HYDRODIURIL) 25 MG tablet Take 25 mg by mouth in the morning.   Yes [provider]  HYDROcodone-acetaminophen (NORCO/VICODIN) 5-325 MG tablet Take 1 tablet by mouth every 4 (four) hours as needed for up to 7 days for moderate pain (pain score 4-6) or severe pain (pain score 7-10). 09/13/23 09/20/23 Yes Hill, Alain Honey, PA-C  Insulin Glargine (BASAGLAR KWIKPEN) 100 UNIT/ML Inject 8 Units into the skin daily.   Yes [provider]  levothyroxine (SYNTHROID) 75 MCG tablet Take 75 mcg by mouth daily before breakfast.   Yes [provider]  mupirocin ointment (BACTROBAN) 2 % Place 1 Application  into the nose 2 (two) times daily for 60 doses. Use as directed 2 times daily for 5 days every other week for 6 weeks. 09/06/23 10/06/23 Yes Hill, Alain Honey, PA-C  NOVOLOG FLEXPEN 100 UNIT/ML FlexPen Inject 5-7 Units into the skin 3 (three) times daily with meals. 12/23/14  Yes [provider]  polyethylene glycol (MIRALAX) 17 g packet Take 17 g by mouth daily as needed for mild constipation or moderate constipation. 09/13/23 10/13/23 Yes Hill, Alain Honey, PA-C  ramipril (ALTACE) 2.5 MG capsule Take 1 capsule (2.5 mg total) by mouth daily. 02/28/23 08/29/23 Yes Runell Gess, MD  senna (SENOKOT) 8.6 MG TABS tablet Take 2 tablets (17.2 mg total) by mouth at bedtime for 15 days. 09/13/23 09/28/23 Yes Hill, Alain Honey, PA-C  sodium chloride (OCEAN) 0.65 % SOLN nasal spray Place 1 spray into both nostrils as needed for congestion.   Yes [provider]  Zinc 50 MG TABS Take 50 mg by mouth daily with lunch.   Yes [provider]  ONE TOUCH ULTRA TEST test strip  10/06/17   [provider]   Physical Exam: Blood pressure (!) 124/52, pulse 79, temperature 97.7 F (36.5 C), resp. rate 15, height 5\' 3"  (1.6 m), weight 50.2 kg, SpO2 95%. Vitals:   09/13/23 0812 09/13/23 1322  BP:  (!) 124/52  Pulse:  79  Resp:  15  Temp:  97.7 F (36.5 C)  SpO2: 92% 95%   Patient was seen and examined at bedside.  Overnight events noted.   Patient reports still having pain but manageable.    Physical Exam Constitutional:      Appearance: Normal appearance. She is normal weight.  HENT:     Head: Normocephalic and atraumatic.  Eyes:     Extraocular Movements: Extraocular movements intact.     Conjunctiva/sclera: Conjunctivae normal.     Pupils: Pupils are equal, round, and reactive to light.  Cardiovascular:     Rate and Rhythm: Normal rate and regular rhythm.     Pulses: Normal pulses.     Heart sounds: Normal heart sounds.  Pulmonary:     Effort: Pulmonary effort is normal.      Breath sounds: Normal breath sounds.  Abdominal:     General: Abdomen is flat. Bowel sounds are normal.     Palpations: Abdomen is soft.  Musculoskeletal:     Comments: Status post right hip arthroplasty POD #7  Skin:    General: Skin is warm and dry.  Neurological:     General: No focal deficit present.     Mental Status: She is alert. Mental status is at baseline. She is disoriented.  Psychiatric:        Mood and Affect: Mood normal.        Behavior: Behavior normal.      Labs on Admission:  Basic  Metabolic Panel: Recent Labs  Lab 09/07/23 0330 09/08/23 0319 09/11/23 1325 09/12/23 0335 09/13/23 0347  NA 136 133* 136 137 134*  K 4.8 5.0 3.5 3.7 3.9  CL 103 100 102 100 99  CO2 23 24 24 25 25   GLUCOSE 186* 239* 258* 243* 230*  BUN 32* 45* 37* 39* 43*  CREATININE 1.36* 1.91* 1.35* 1.16* 1.41*  CALCIUM 8.1* 8.1* 8.5* 8.4* 7.9*   Liver Function Tests: No results for input(s): "AST", "ALT", "ALKPHOS", "BILITOT", "PROT", "ALBUMIN" in the last 168 hours. No results for input(s): "LIPASE", "AMYLASE" in the last 168 hours. No results for input(s): "AMMONIA" in the last 168 hours. CBC: Recent Labs  Lab 09/07/23 0330 09/08/23 0319 09/09/23 0002 09/10/23 1013 09/12/23 0335 09/13/23 0347  WBC 25.5* 13.7*  --  14.9* 12.8* 10.4  HGB 7.9* 7.0* 9.4* 9.8* 10.3* 9.0*  HCT 25.7* 23.1* 29.0* 30.6* 32.6* 28.0*  MCV 106.2* 106.9*  --  95.9 96.2 95.9  PLT 274 231  --  233 311 319   Cardiac Enzymes: No results for input(s): "CKTOTAL", "CKMB", "CKMBINDEX", "TROPONINI" in the last 168 hours. BNP: Invalid input(s): "POCBNP" CBG: Recent Labs  Lab 09/12/23 1600 09/12/23 2102 09/13/23 0720 09/13/23 0930 09/13/23 1228  GLUCAP 338* 253* 241* 397* 229*    Radiological Exams on Admission: No results found.  EKG: Independently reviewed.  Normal sinus rhythm with premature atrial contractions.  Time spent: 50 mins  Akiem Urieta Khatri [facility]   If 7PM-7AM, please  contact night-coverage [email or web address]

## 2023-09-13 NOTE — TOC Progression Note (Addendum)
 Transition of Care [facility]) - Progression Note    Patient Details  Name: Ariel Cobb MRN: [id number] Date of Birth: 1946-03-06  [facility] CM/SW Contact  Howell Rucks, RN Phone Number: 09/13/2023, 11:11 AM  Clinical Narrative: NCM called to [facility], sw Lo B, confirmed SNF auth approved, Auth# [id number], days approved 3/18 to 09/18/23, Tracey, [facility] notified, team notified.    -1:41pm Teams chat from attending, pt not medically stable,  needs better blood glucose control and to have a BM. TOC will continue to follow.        Barriers to Discharge: No Barriers Identified  Expected Discharge Plan and Services                         DME Arranged: N/A DME Agency: NA                   Social Determinants of Health (SDOH) Interventions SDOH Screenings   Food Insecurity: No Food Insecurity (09/07/2023)  Housing: Low Risk  (09/07/2023)  Transportation Needs: No Transportation Needs (09/07/2023)  Utilities: Not At Risk (09/07/2023)  Social Connections: Moderately Isolated (09/07/2023)  Tobacco Use: High Risk (09/06/2023)    Readmission Risk Interventions    09/11/2023    2:29 PM  Readmission Risk Prevention Plan  Post Dischage Appt Complete  Medication Screening Complete  Transportation Screening Complete

## 2023-09-14 DIAGNOSIS — Z96641 Presence of right artificial hip joint: Secondary | ICD-10-CM | POA: Diagnosis not present

## 2023-09-14 LAB — BASIC METABOLIC PANEL
Anion gap: 11 (ref 5–15)
BUN: 47 mg/dL — ABNORMAL HIGH (ref 8–23)
CO2: 25 mmol/L (ref 22–32)
Calcium: 8.1 mg/dL — ABNORMAL LOW (ref 8.9–10.3)
Chloride: 96 mmol/L — ABNORMAL LOW (ref 98–111)
Creatinine, Ser: 1.65 mg/dL — ABNORMAL HIGH (ref 0.44–1.00)
GFR, Estimated: 31 mL/min — ABNORMAL LOW (ref >60)
Glucose, Bld: 198 mg/dL — ABNORMAL HIGH (ref 70–99)
Potassium: 3.7 mmol/L (ref 3.5–5.1)
Sodium: 132 mmol/L — ABNORMAL LOW (ref 135–145)

## 2023-09-14 LAB — GLUCOSE, CAPILLARY
Glucose-Capillary: 173 mg/dL — ABNORMAL HIGH (ref 70–99)
Glucose-Capillary: 272 mg/dL — ABNORMAL HIGH (ref 70–99)
Glucose-Capillary: 364 mg/dL — ABNORMAL HIGH (ref 70–99)
Glucose-Capillary: 308 mg/dL — ABNORMAL HIGH (ref 70–99)

## 2023-09-14 NOTE — Discharge Summary (Addendum)
 Physician Discharge Summary  Patient ID: Ariel Cobb MRN: [id number] DOB/AGE: 08-17-1945 78 y.o.  Admit date: 09/06/2023 Discharge date: 09/18/2023   Admission Diagnoses:  S/P total right hip arthroplasty  Discharge Diagnoses:  Principal Problem:   S/P total right hip arthroplasty Active Problems:   Avascular necrosis of hip, right (HCC)   Malnutrition of moderate degree   Past Medical History:  Diagnosis Date   A-fib (HCC)    Anemia    Arthritis    Chronic bronchitis (HCC)    Chronic kidney disease    sees dr sanford yearly stage 3 stable   Claudication (HCC)    Complication of anesthesia 2000   slow to awaken after hip surgery   COPD (chronic obstructive pulmonary disease) (HCC)    Depression    Diabetes mellitus without complication (HCC)    type 1   Dysrhythmia    Hyperlipidemia    Hypertension    Hypothyroidism    Impetigo    Neuromuscular disorder (HCC)    peripheral neuropathy   Osteoporosis    PAD (peripheral artery disease) (HCC)    a. 07/29/13: s/p PV angiogram with successful diamondback orbital rotational atherectomy of the high-grade calcified R popliteal artery stenosis   Shortness of breath    on exertion   Thyroid disease     Surgeries: Procedure(s): ARTHROPLASTY, HIP, TOTAL, ANTERIOR APPROACH on 09/06/2023   Consultants (if any): Treatment Team:  Noralee Stain, DO  Discharged Condition: Improved  Hospital Course: Ariel Cobb is an 78 y.o. female who was admitted 09/06/2023 with a diagnosis of S/P total right hip arthroplasty and went to the operating room on 09/06/2023 and underwent the above named procedures.    She was given perioperative antibiotics:  Anti-infectives (From admission, onward)    Start     Dose/Rate Route Frequency Ordered Stop   09/07/23 0600  ceFAZolin (ANCEF) IVPB 2g/100 mL premix        2 g 200 mL/hr over 30 Minutes Intravenous On call to O.R. 09/06/23 1237 09/07/23 0630   09/06/23 2300  ceFAZolin (ANCEF)  IVPB 2g/100 mL premix        2 g 200 mL/hr over 30 Minutes Intravenous Every 6 hours 09/06/23 2047 09/07/23 0448   09/06/23 1800  vancomycin (VANCOCIN) powder  Status:  Discontinued          As needed 09/06/23 1724 09/06/23 2237       She was given sequential compression devices, early ambulation, and Eliquis for DVT prophylaxis.  POD#1 ABLA hemoglobin 7.9. Nutrition consult placed. Diabetes coordinator on board for glucose control. Slow progression with PT. N/V reported,controlled with medication.  POD#2 ABLA hemoglobin 7.0, transfused 2 units PRBCs. Eliquis held. Creatinine 1.91, IV fluids restarted, encouraged PO intake. Slow PT progression with TDWB.  POD#3 ABLA. Hemoglobin 9.3. Eliquis resumed. Slow progression with PT.  POD#4. Hemoglobin 9.8. Continued PT.  POD#5 HTN resumed home ACE. PT continued. Patient agreeable to SNF.  POD#6 Continued PT. Adjusted insulin per diabetes coordinator due to poor glucose control.  POD#7 Hemoglobin 9.0. Adjusted insulin again due to continued blood glucose >200. TRH consulted for additional aid in management. Held hydrochlorothiazide. Continued work with PT/OT. BM x2.  POD#8 Patient doing better. Glucose better controlled still not consistently under 200.[name] Continued PT/OT.  POD#9 Increased insulin for better glucose control. Continue PT/OT. Blood glucose more consistently under 200.  POD#10 patient having some nausea controlled with medication. Continued consistent blood glucose under 200.  POD #11 Patient doing okay.  Improved nausea today. Continued PT.  POD#12 Patient doing well. Nausea improved. Glucose has been trending under 200. D/c to [facility] today. Follow-up in office in 1 week for repeat evaluation.   She benefited maximally from the hospital stay and there were no complications.    Recent vital signs:  Vitals:   09/17/23 2131 09/18/23 0517  BP: (!) 129/53 (!) 143/53  Pulse: 84 74  Resp: 17 17  Temp: 98.9 F (37.2 C) 98.3 F (36.8 C)   SpO2: 92% 95%    Recent laboratory studies:  Lab Results  Component Value Date   HGB 9.0 (L) 09/13/2023   HGB 10.3 (L) 09/12/2023   HGB 9.8 (L) 09/10/2023   Lab Results  Component Value Date   WBC 10.4 09/13/2023   PLT 319 09/13/2023   Lab Results  Component Value Date   INR 1.2 08/05/2021   Lab Results  Component Value Date   NA 132 (L) 09/14/2023   K 3.7 09/14/2023   CL 96 (L) 09/14/2023   CO2 25 09/14/2023   BUN 47 (H) 09/14/2023   CREATININE 1.65 (H) 09/14/2023   GLUCOSE 198 (H) 09/14/2023     Allergies as of 09/18/2023       Reactions   Adhesive [tape] Itching   Surgical Tape - "makes me itch and break me out" -- HAS TO USE PAPER TAPE   Amoxicillin Hives, Itching, Swelling   Latex Itching   Pioglitazone Other (See Comments)   Unknown reaction         Medication List     TAKE these medications    acetaminophen 650 MG CR tablet Commonly known as: TYLENOL Take 650 mg by mouth at bedtime as needed for pain.   albuterol 108 (90 Base) MCG/ACT inhaler Commonly known as: VENTOLIN HFA Inhale 2 puffs into the lungs every 6 (six) hours as needed for wheezing or shortness of breath.   ascorbic acid 500 MG tablet Commonly known as: VITAMIN C Take 500 mg by mouth daily with lunch.   atorvastatin 80 MG tablet Commonly known as: LIPITOR TAKE 1 TABLET BY MOUTH EVERY MORNING.   Basaglar KwikPen 100 UNIT/ML Inject 15 Units into the skin daily. What changed: how much to take   bismuth subsalicylate 262 MG/15ML suspension Commonly known as: PEPTO BISMOL Take 30 mLs by mouth every 6 (six) hours as needed for diarrhea or loose stools or indigestion.   budesonide-formoterol 160-4.5 MCG/ACT inhaler Commonly known as: SYMBICORT Inhale 2 puffs into the lungs 2 (two) times daily.   chlorhexidine 4 % external liquid Commonly known as: HIBICLENS Apply 15 mLs (1 Application total) topically as directed for 30 doses. Use as directed daily for 5 days every other  week for 6 weeks.   clobetasol 0.05 % external solution Commonly known as: TEMOVATE Apply 1 Application topically 2 (two) times daily as needed (psoriasis).   docusate sodium 100 MG capsule Commonly known as: Colace Take 1 capsule (100 mg total) by mouth 2 (two) times daily.   Eliquis 2.5 MG Tabs tablet Generic drug: apixaban TAKE 1 TABLET BY MOUTH TWICE A DAY   ferrous sulfate 325 (65 FE) MG tablet Take 325 mg by mouth daily.   hydrochlorothiazide 25 MG tablet Commonly known as: HYDRODIURIL Take 25 mg by mouth in the morning.   HYDROcodone-acetaminophen 5-325 MG tablet Commonly known as: NORCO/VICODIN Take 1 tablet by mouth every 4 (four) hours as needed for up to 7 days for moderate pain (pain score 4-6) or severe  pain (pain score 7-10).   levothyroxine 75 MCG tablet Commonly known as: SYNTHROID Take 75 mcg by mouth daily before breakfast.   mupirocin ointment 2 % Commonly known as: BACTROBAN Place 1 Application into the nose 2 (two) times daily for 60 doses. Use as directed 2 times daily for 5 days every other week for 6 weeks.   NovoLOG FlexPen 100 UNIT/ML FlexPen Generic drug: insulin aspart Inject 5-8 Units into the skin 3 (three) times daily with meals. What changed: how much to take   ONE TOUCH ULTRA TEST test strip Generic drug: glucose blood   polyethylene glycol 17 g packet Commonly known as: MiraLax Take 17 g by mouth daily as needed for mild constipation or moderate constipation.   ramipril 2.5 MG capsule Commonly known as: ALTACE Take 1 capsule (2.5 mg total) by mouth daily.   senna 8.6 MG Tabs tablet Commonly known as: SENOKOT Take 2 tablets (17.2 mg total) by mouth at bedtime for 15 days.   sodium chloride 0.65 % Soln nasal spray Commonly known as: OCEAN Place 1 spray into both nostrils as needed for congestion.   Zinc 50 MG Tabs Take 50 mg by mouth daily with lunch.               Discharge Care Instructions  (From admission,  onward)           Start     Ordered   09/18/23 0000  Touch down weight bearing       Question Answer Comment  Laterality right   Extremity Lower      09/18/23 0816   09/18/23 0000  Change dressing       Comments: Do not change your dressing.   09/18/23 0816              WEIGHT BEARING   Other:  Touchdown weightbearing right lower extremity with walker.    EXERCISES  Results after joint replacement surgery are often greatly improved when you follow the exercise, range of motion and muscle strengthening exercises prescribed by your doctor. Safety measures are also important to protect the joint from further injury. Any time any of these exercises cause you to have increased pain or swelling, decrease what you are doing until you are comfortable again and then slowly increase them. If you have problems or questions, call your caregiver or physical therapist for advice.   Rehabilitation is important following a joint replacement. After just a few days of immobilization, the muscles of the leg can become weakened and shrink (atrophy).  These exercises are designed to build up the tone and strength of the thigh and leg muscles and to improve motion. Often times heat used for twenty to thirty minutes before working out will loosen up your tissues and help with improving the range of motion but do not use heat for the first two weeks following surgery (sometimes heat can increase post-operative swelling).   These exercises can be done on a training (exercise) mat, on the floor, on a table or on a bed. Use whatever works the best and is most comfortable for you.    Use music or television while you are exercising so that the exercises are a pleasant break in your day. This will make your life better with the exercises acting as a break in your routine that you can look forward to.   Perform all exercises about fifteen times, three times per day or as directed.  You should exercise both the  operative leg and the other leg as well.  Exercises include:   Quad Sets - Tighten up the muscle on the front of the thigh (Quad) and hold for 5-10 seconds.   Straight Leg Raises - With your knee straight (if you were given a brace, keep it on), lift the leg to 60 degrees, hold for 3 seconds, and slowly lower the leg.  Perform this exercise against resistance later as your leg gets stronger.  Leg Slides: Lying on your back, slowly slide your foot toward your buttocks, bending your knee up off the floor (only go as far as is comfortable). Then slowly slide your foot back down until your leg is flat on the floor again.  Angel Wings: Lying on your back spread your legs to the side as far apart as you can without causing discomfort.  Hamstring Strength:  Lying on your back, push your heel against the floor with your leg straight by tightening up the muscles of your buttocks.  Repeat, but this time bend your knee to a comfortable angle, and push your heel against the floor.  You may put a pillow under the heel to make it more comfortable if necessary.   A rehabilitation program following joint replacement surgery can speed recovery and prevent re-injury in the future due to weakened muscles. Contact your doctor or a physical therapist for more information on knee rehabilitation.    CONSTIPATION  Constipation is defined medically as fewer than three stools per week and severe constipation as less than one stool per week.  Even if you have a regular bowel pattern at home, your normal regimen is likely to be disrupted due to multiple reasons following surgery.  Combination of anesthesia, postoperative narcotics, change in appetite and fluid intake all can affect your bowels.   YOU MUST use at least one of the following options; they are listed in order of increasing strength to get the job done.  They are all available over the counter, and you may need to use some, POSSIBLY even all of these options:     Drink plenty of fluids (prune juice may be helpful) and high fiber foods Colace 100 mg by mouth twice a day  Senokot for constipation as directed and as needed Dulcolax (bisacodyl), take with full glass of water  Miralax (polyethylene glycol) once or twice a day as needed.  If you have tried all these things and are unable to have a bowel movement in the first 3-4 days after surgery call either your surgeon or your primary doctor.    If you experience loose stools or diarrhea, hold the medications until you stool forms back up.  If your symptoms do not get better within 1 week or if they get worse, check with your doctor.  If you experience "the worst abdominal pain ever" or develop nausea or vomiting, please contact the office immediately for further recommendations for treatment.   ITCHING:  If you experience itching with your medications, try taking only a single pain pill, or even half a pain pill at a time.  You can also use Benadryl over the counter for itching or also to help with sleep.   TED HOSE STOCKINGS:  Use stockings on both legs until for at least 2 weeks or as directed by physician office. They may be removed at night for sleeping.  MEDICATIONS:  See your medication summary on the "After Visit Summary" that nursing will review with you.  You may have some home  medications which will be placed on hold until you complete the course of blood thinner medication.  It is important for you to complete the blood thinner medication as prescribed.  PRECAUTIONS:  If you experience chest pain or shortness of breath - call 911 immediately for transfer to the hospital emergency department.   If you develop a fever greater that 101 F, purulent drainage from wound, increased redness or drainage from wound, foul odor from the wound/dressing, or calf pain - CONTACT YOUR SURGEON.                                                   FOLLOW-UP APPOINTMENTS:  If you do not already have a post-op  appointment, please call the office for an appointment to be seen by your surgeon.  Guidelines for how soon to be seen are listed in your "After Visit Summary", but are typically between 1-4 weeks after surgery.  OTHER INSTRUCTIONS:   Knee Replacement:  Do not place pillow under knee, focus on keeping the knee straight while resting. CPM instructions: 0-90 degrees, 2 hours in the morning, 2 hours in the afternoon, and 2 hours in the evening. Place foam block, curve side up under heel at all times except when in CPM or when walking.  DO NOT modify, tear, cut, or change the foam block in any way.   MAKE SURE YOU:  Understand these instructions.  Get help right away if you are not doing well or get worse.    Thank you for letting us be a part of your medical care team.  It is a privilege we respect greatly.  We hope these instructions will help you stay on track for a fast and full recovery!   Diagnostic Studies: DG CHEST PORT 1 VIEW Result Date: 09/10/2023 CLINICAL DATA:  Cough and shortness of breath. EXAM: PORTABLE CHEST 1 VIEW COMPARISON:  09/29/2021 and older exams. FINDINGS: Cardiac silhouette normal in size.  No mediastinal or hilar masses. Lungs are hyperexpanded with flattened hemidiaphragms. Mild prominence of the interstitial markings, stable. No lung consolidation. No evidence of edema. No convincing pleural effusion and no evidence of a pneumothorax. Skeletal structures are demineralized, grossly intact. IMPRESSION: 1. No acute cardiopulmonary disease. 2. Hyperexpanded lungs consistent with COPD. Electronically Signed   By: Amie Portland M.D.   On: 09/10/2023 13:23   DG Pelvis Portable Result Date: 09/06/2023 CLINICAL DATA:  Status post right hip arthroplasty. EXAM: PORTABLE PELVIS 1-2 VIEWS COMPARISON:  Pelvis radiograph 06/14/2023 FINDINGS: Right hip arthroplasty. There is a displaced fracture of the central right acetabulum with mild acetabular protrusio. Recent postsurgical change  includes air and edema in the soft tissues. Overlying skin staples in place. Previous left hip arthroplasty, partially included. IMPRESSION: New right hip arthroplasty. Displaced fracture of the central right acetabulum with mild acetabular protrusion. Electronically Signed   By: Narda Rutherford M.D.   On: 09/06/2023 21:32   DG HIP UNILAT WITH PELVIS 1V RIGHT Result Date: 09/06/2023 CLINICAL DATA:  Elective surgery. EXAM: DG HIP (WITH OR WITHOUT PELVIS) 1V RIGHT COMPARISON:  None Available. FINDINGS: Twelve fluoroscopic spot views of the pelvis and right hip obtained in the operating room. Sequential images during hip arthroplasty. Fluoroscopy time 32 seconds. Dose 3.48 mGy. Previous left hip arthroplasty. IMPRESSION: Intraoperative fluoroscopy during right hip arthroplasty. Electronically Signed   By: Shawna Orleans  Sanford M.D.   On: 09/06/2023 21:25   DG C-Arm 1-60 Min-No Report Result Date: 09/06/2023 Fluoroscopy was utilized by the requesting physician.  No radiographic interpretation.   DG C-Arm 1-60 Min-No Report Result Date: 09/06/2023 Fluoroscopy was utilized by the requesting physician.  No radiographic interpretation.   DG C-Arm 1-60 Min-No Report Result Date: 09/06/2023 Fluoroscopy was utilized by the requesting physician.  No radiographic interpretation.   DG C-Arm 1-60 Min-No Report Result Date: 09/06/2023 Fluoroscopy was utilized by the requesting physician.  No radiographic interpretation.    Disposition: Discharge disposition: 03-Skilled Nursing Facility       Discharge Instructions     Call MD / Call 911   Complete by: As directed    If you experience chest pain or shortness of breath, CALL 911 and be transported to the hospital emergency room.  If you develope a fever above 101 F, pus (white drainage) or increased drainage or redness at the wound, or calf pain, call your surgeon's office.   Change dressing   Complete by: As directed    Do not change your dressing.    Constipation Prevention   Complete by: As directed    Drink plenty of fluids.  Prune juice may be helpful.  You may use a stool softener, such as Colace (over the counter) 100 mg twice a day.  Use MiraLax (over the counter) for constipation as needed.   Diet - low sodium heart healthy   Complete by: As directed    Diet Carb Modified   Complete by: As directed    Discharge instructions   Complete by: As directed    Elevate toes above nose. Use cryotherapy as needed for pain and swelling.   Driving restrictions   Complete by: As directed    No driving for 6 weeks   Increase activity slowly as tolerated   Complete by: As directed    Lifting restrictions   Complete by: As directed    No lifting for 6 weeks   Post-operative opioid taper instructions:   Complete by: As directed    POST-OPERATIVE OPIOID TAPER INSTRUCTIONS: It is important to wean off of your opioid medication as soon as possible. If you do not need pain medication after your surgery it is ok to stop day one. Opioids include: Codeine, Hydrocodone(Norco, Vicodin), Oxycodone(Percocet, oxycontin) and hydromorphone amongst others.  Long term and even short term use of opiods can cause: Increased pain response Dependence Constipation Depression Respiratory depression And more.  Withdrawal symptoms can include Flu like symptoms Nausea, vomiting And more Techniques to manage these symptoms Hydrate well Eat regular healthy meals Stay active Use relaxation techniques(deep breathing, meditating, yoga) Do Not substitute Alcohol to help with tapering If you have been on opioids for less than two weeks and do not have pain than it is ok to stop all together.  Plan to wean off of opioids This plan should start within one week post op of your joint replacement. Maintain the same interval or time between taking each dose and first decrease the dose.  Cut the total daily intake of opioids by one tablet each day Next start to  increase the time between doses. The last dose that should be eliminated is the evening dose.      TED hose   Complete by: As directed    Use stockings (TED hose) for 2 weeks on both leg(s).  You may remove them at night for sleeping.   Touch down weight bearing  Complete by: As directed    Laterality: right   Extremity: Lower        Contact information for follow-up providers     Clois Dupes, PA-C. Schedule an appointment as soon as possible for a visit in 1 week(s).   Specialty: Orthopedic Surgery Why: For suture removal, For wound re-check Contact information: [address], [address] [place] [address] [phone]              Contact information for after-discharge care     Destination     [facility] [place] Preferred SNF .   Service: Skilled Nursing Contact information: [address] [place] [address] [phone]                      Signed: Clois Dupes 09/18/2023, 8:17 AM

## 2023-09-14 NOTE — Progress Notes (Signed)
 Nutrition Follow-up  DOCUMENTATION CODES:   Non-severe (moderate) malnutrition in context of chronic illness  INTERVENTION:  - Carb Modified diet per MD. - Magic cup BID with meals, each supplement provides 290 kcal and 9 grams of protein - Encourage intake as tolerated at all meals and of supplements.  - Continue Multivitamin with minerals daily.  - Monitor weight trends.    NUTRITION DIAGNOSIS:   Moderate Malnutrition related to chronic illness as evidenced by moderate fat depletion, severe muscle depletion. *ongoing  GOAL:   Patient will meet greater than or equal to 90% of their needs *progressing  MONITOR:   PO intake, Supplement acceptance, Weight trends  REASON FOR ASSESSMENT:   Consult Assessment of nutrition requirement/status  ASSESSMENT:   78 y.o. female with PMH COPD, DM2, CKD, HTN who presented fir planned total hip arthroplasty for diagnosis of right hip avascular necrosis.  Patient reports she has been ordering 3 meals a day and eating as much as she can of them. Documented to be consuming 50-100% of meals with average of 82%.  Getting Magic Cup and reports enjoying it, eating a little each time she receives it. Patient also occasionally receiving an Ensure and drinking a little of that as well.  Encouraged patient to continue to order 3 meals a day and continue to eat well at each meal for promote healing. Sister at bedside reports patient doesn't eat a lot of meat so discussed other dietary sources of protein to promote healing. Patient endorsed understanding.     Medications reviewed and include: 500mg  vitamin C daily, 220mg  zinc daily, MVI, 325mg  ferrous sulfate daily, Colace, Senokot  Labs reviewed:  Na 132 Creatinine 1.65 HA1C 5.4 Blood Glucose 152-272 x24 hours   Diet Order:   Diet Order             Diet Carb Modified Fluid consistency: Thin; Room service appropriate? Yes  Diet effective now                   EDUCATION NEEDS:   Education needs have been addressed  Skin:  Skin Assessment: Skin Integrity Issues: Skin Integrity Issues:: Incisions Incisions: Right Hip  Last BM:  3/19  Height:  Ht Readings from Last 1 Encounters:  09/07/23 5\' 3"  (1.6 m)   Weight:  Wt Readings from Last 1 Encounters:  09/07/23 50.2 kg    BMI:  Body mass index is 19.6 kg/m.  Estimated Nutritional Needs:  Kcal:  1500-1650 kcals Protein:  75-85 grams Fluid:  >/= 1.5L    Shelle Iron RD, LDN Contact via Secure Chat.

## 2023-09-14 NOTE — Progress Notes (Signed)
    Subjective:  Patient reports pain as moderate.  Denies N/V/CP/SOB/Abd pain.  Discussed better glucose control today. Goal under 200. Continue to monitor and adjustments as needed.  She did have BM x2 yesterday.  Looks like placement to [facility].  Sister at bedside. All questions solicited and addressed.   Objective:   VITALS:   Vitals:   09/13/23 1322 09/13/23 2056 09/13/23 2152 09/14/23 0704  BP: (!) 124/52  (!) 147/51 (!) 134/58  Pulse: 79  79 67  Resp: 15  16 16   Temp: 97.7 F (36.5 C)  98.3 F (36.8 C) 98.1 F (36.7 C)  TempSrc:   Oral Oral  SpO2: 95% 96% 94% (!) 88%  Weight:      Height:        NAD Neurologically intact ABD soft Neurovascular intact Sensation intact distally Intact pulses distally Dorsiflexion/Plantar flexion intact Incision: scant drainage No cellulitis present Compartment soft Thigh edema present, improved since yesterday. Soft and compressible.   Lab Results  Component Value Date   WBC 10.4 09/13/2023   HGB 9.0 (L) 09/13/2023   HCT 28.0 (L) 09/13/2023   MCV 95.9 09/13/2023   PLT 319 09/13/2023   BMET    Component Value Date/Time   NA 134 (L) 09/13/2023 0347   NA 142 01/30/2023 1001   K 3.9 09/13/2023 0347   CL 99 09/13/2023 0347   CO2 25 09/13/2023 0347   GLUCOSE 230 (H) 09/13/2023 0347   BUN 43 (H) 09/13/2023 0347   BUN 31 (H) 01/30/2023 1001   CREATININE 1.41 (H) 09/13/2023 0347   CREATININE 1.42 (H) 07/24/2013 1236   CALCIUM 7.9 (L) 09/13/2023 0347   EGFR 42 (L) 01/30/2023 1001   GFRNONAA 38 (L) 09/13/2023 0347     Assessment/Plan: 8 Days Post-Op   Principal Problem:   S/P total right hip arthroplasty Active Problems:   Avascular necrosis of hip, right (HCC)   Malnutrition of moderate degree    Touchdown weightbearing RLE with walker DVT ppx:  low dose Eliquis SCDs, TEDS PO pain control PT/OT: She did okay with TDWB, still needing moderate assist. Fatigues easily. SNF recommendation.  Dispo:  - Patient  has baseline orthostatic hypotension and dizziness.Continue PT/OT - Patient reluctant about SNF but agreeable to SNF due to TDWB restrictions and self care at home. Looking like d/c to [facility].  - Adjusted insulin again yesterday per diabetes coordinator. TRH consult for further recommendations. Appreciate assistance with management. Blood glucose better controlled today. Will continue to monitor. Goal blood glucose <200 for wound healing.    Clois Dupes 09/14/2023, 10:08 AM   [facility]  [facility] [address], [address], [place], [place] [address] Phone: [phone] [email or web address] Facebook  [employer]

## 2023-09-14 NOTE — Progress Notes (Signed)
 PROGRESS NOTE    Ariel Cobb  YHC:[id number] DOB: 12-02-1945 DOA: 09/06/2023 PCP: Renford Dills, MD     Brief Narrative:  Ariel Cobb is a 78 years old female with PMH significant for tobacco abuse, COPD, paroxysmal A-fib on Eliquis, hypothyroidism, type 2 diabetes, CKD stage IIIb, peripheral artery disease, essential hypertension, hyperlipidemia, tobacco abuse, admitted under orthopedic service for elective right hip total arthroplasty. TRH is consulted for diabetic management.   New events last 24 hours / Subjective: Patient without any new complaints.  Currently awaiting SNF placement.  Blood sugars improved.  She states that she takes 8 units of long-acting insulin around 4:00 in the evening daily as well as 5 to 8 units of NovoLog 3 times a day with meals depending on her blood sugar checks.  Assessment & Plan:   Principal Problem:   S/P total right hip arthroplasty Active Problems:   Avascular necrosis of hip, right (HCC)   Malnutrition of moderate degree   Type 2 diabetes mellitus with hyperglycemia postoperatively -Her hemoglobin A1c is 5.4.  Postoperatively, she has been dealing with some hyperglycemia.  Blood sugar today is much better.  Continue Lantus 10 units daily, NovoLog 4 units 3 times a day + sliding scale insulin  Avascular necrosis of right hip s/p right hip arthroplasty -Per ortho -PT OT recommending SNF placement   HTN -Ramipril  HLD -Lipitor  Paroxysmal A fib -Eliquis  Hypothyroidism -Synthroid  CKD stage 3b -Baseline creatinine 1.4 -Stable   COPD -Without exacerbation -Ok with SpO2 >88%    Patient is stable to discharge to SNF from medical standpoint. TRH will continue to follow along daily for DM management.   Antimicrobials:  Anti-infectives (From admission, onward)    Start     Dose/Rate Route Frequency Ordered Stop   09/07/23 0600  ceFAZolin (ANCEF) IVPB 2g/100 mL premix        2 g 200 mL/hr over 30 Minutes Intravenous  On call to O.R. 09/06/23 1237 09/07/23 0630   09/06/23 2300  ceFAZolin (ANCEF) IVPB 2g/100 mL premix        2 g 200 mL/hr over 30 Minutes Intravenous Every 6 hours 09/06/23 2047 09/07/23 0448   09/06/23 1800  vancomycin (VANCOCIN) powder  Status:  Discontinued          As needed 09/06/23 1724 09/06/23 2237        Objective: Vitals:   09/13/23 2152 09/14/23 0704 09/14/23 1039 09/14/23 1211  BP: (!) 147/51 (!) 134/58 (!) 141/43   Pulse: 79 67    Resp: 16 16    Temp: 98.3 F (36.8 C) 98.1 F (36.7 C)    TempSrc: Oral Oral    SpO2: 94% (!) 88%  90%  Weight:      Height:        Intake/Output Summary (Last 24 hours) at 09/14/2023 1237 Last data filed at 09/14/2023 0600 Gross per 24 hour  Intake 450 ml  Output --  Net 450 ml   Filed Weights   09/06/23 1337 09/07/23 0330  Weight: 49.9 kg 50.2 kg    Examination:  General exam: Appears calm and comfortable  Respiratory system: Clear to auscultation. Respiratory effort normal. No respiratory distress. No conversational dyspnea.  Cardiovascular system: S1 & S2 heard Central nervous system: Alert and oriented. No focal neurological deficits. Speech clear.  Extremities: Symmetric in appearance  Skin: No rashes, lesions or ulcers on exposed skin  Psychiatry: Judgement and insight appear normal. Mood & affect appropriate.  Data Reviewed: I have personally reviewed following labs and imaging studies  CBC: Recent Labs  Lab 09/08/23 0319 09/09/23 0002 09/10/23 1013 09/12/23 0335 09/13/23 0347  WBC 13.7*  --  14.9* 12.8* 10.4  HGB 7.0* 9.4* 9.8* 10.3* 9.0*  HCT 23.1* 29.0* 30.6* 32.6* 28.0*  MCV 106.9*  --  95.9 96.2 95.9  PLT 231  --  233 311 319   Basic Metabolic Panel: Recent Labs  Lab 09/08/23 0319 09/11/23 1325 09/12/23 0335 09/13/23 0347  NA 133* 136 137 134*  K 5.0 3.5 3.7 3.9  CL 100 102 100 99  CO2 24 24 25 25   GLUCOSE 239* 258* 243* 230*  BUN 45* 37* 39* 43*  CREATININE 1.91* 1.35* 1.16* 1.41*   CALCIUM 8.1* 8.5* 8.4* 7.9*   GFR: Estimated Creatinine Clearance: 25.6 mL/min (A) (by C-G formula based on SCr of 1.41 mg/dL (H)). Liver Function Tests: No results for input(s): "AST", "ALT", "ALKPHOS", "BILITOT", "PROT", "ALBUMIN" in the last 168 hours. No results for input(s): "LIPASE", "AMYLASE" in the last 168 hours. No results for input(s): "AMMONIA" in the last 168 hours. Coagulation Profile: No results for input(s): "INR", "PROTIME" in the last 168 hours. Cardiac Enzymes: No results for input(s): "CKTOTAL", "CKMB", "CKMBINDEX", "TROPONINI" in the last 168 hours. BNP (last 3 results) No results for input(s): "PROBNP" in the last 8760 hours. HbA1C: No results for input(s): "HGBA1C" in the last 72 hours. CBG: Recent Labs  Lab 09/13/23 0930 09/13/23 1228 09/13/23 1606 09/13/23 2144 09/14/23 0821  GLUCAP 397* 229* 194* 152* 173*   Lipid Profile: No results for input(s): "CHOL", "HDL", "LDLCALC", "TRIG", "CHOLHDL", "LDLDIRECT" in the last 72 hours. Thyroid Function Tests: No results for input(s): "TSH", "T4TOTAL", "FREET4", "T3FREE", "THYROIDAB" in the last 72 hours. Anemia Panel: No results for input(s): "VITAMINB12", "FOLATE", "FERRITIN", "TIBC", "IRON", "RETICCTPCT" in the last 72 hours. Sepsis Labs: No results for input(s): "PROCALCITON", "LATICACIDVEN" in the last 168 hours.  No results found for this or any previous visit (from the past 240 hours).    Radiology Studies: No results found.    Scheduled Meds:  apixaban  2.5 mg Oral BID   ascorbic acid  500 mg Oral Q lunch   atorvastatin  80 mg Oral q morning   docusate sodium  100 mg Oral BID   feeding supplement  237 mL Oral BID BM   ferrous sulfate  325 mg Oral Daily   guaiFENesin  600 mg Oral BID   insulin aspart  0-5 Units Subcutaneous QHS   insulin aspart  0-9 Units Subcutaneous TID WC   insulin aspart  4 Units Subcutaneous TID WC   insulin glargine  10 Units Subcutaneous Daily   levothyroxine  75  mcg Oral Q0600   mometasone-formoterol  2 puff Inhalation BID   multivitamin with minerals  1 tablet Oral Daily   pantoprazole  40 mg Oral Daily   ramipril  2.5 mg Oral Daily   senna  1 tablet Oral BID   zinc sulfate (50mg  elemental zinc)  220 mg Oral Q lunch   Continuous Infusions:   LOS: 6 days   Time spent: 35 minutes   Noralee Stain, DO [facility] 09/14/2023, 12:37 PM   Available via Epic secure chat 7am-7pm After these hours, please refer to coverage provider listed on [email or web address]

## 2023-09-15 DIAGNOSIS — Z96641 Presence of right artificial hip joint: Secondary | ICD-10-CM | POA: Diagnosis not present

## 2023-09-15 LAB — GLUCOSE, CAPILLARY
Glucose-Capillary: 204 mg/dL — ABNORMAL HIGH (ref 70–99)
Glucose-Capillary: 284 mg/dL — ABNORMAL HIGH (ref 70–99)
Glucose-Capillary: 117 mg/dL — ABNORMAL HIGH (ref 70–99)
Glucose-Capillary: 114 mg/dL — ABNORMAL HIGH (ref 70–99)

## 2023-09-15 MED ORDER — FLUTICASONE PROPIONATE 50 MCG/ACT NA SUSP: 1.0000 | Freq: Every day | NASAL | Status: DC | PRN

## 2023-09-15 MED ORDER — FLUTICASONE PROPIONATE 50 MCG/ACT NA SUSP: 1.0000 | Freq: Every day | NASAL | Status: DC

## 2023-09-15 MED ORDER — SALINE SPRAY 0.65 % NA SOLN
1.0000 | NASAL | Status: DC | PRN
Start: 2023-09-15 — End: 2023-09-18
  Administered 2023-09-15 – 2023-09-16 (×3): 1 via NASAL
  Filled 2023-09-15: qty 44

## 2023-09-15 MED ORDER — ZINC SULFATE 220 (50 ZN) MG PO CAPS
220.0000 mg | ORAL_CAPSULE | Freq: Every day | ORAL | Status: DC
  Administered 2023-09-16 – 2023-09-18 (×3): 220 mg via ORAL
  Filled 2023-09-15 (×3): qty 1

## 2023-09-15 MED ORDER — GLUCERNA SHAKE PO LIQD
237.0000 mL | Freq: Three times a day (TID) | ORAL | Status: DC
  Administered 2023-09-15 – 2023-09-17 (×4): 237 mL via ORAL
  Filled 2023-09-15 (×11): qty 237

## 2023-09-15 MED ORDER — INSULIN GLARGINE 100 UNIT/ML ~~LOC~~ SOLN
12.0000 [IU] | Freq: Every day | SUBCUTANEOUS | Status: DC
  Administered 2023-09-15 – 2023-09-16 (×2): 12 [IU] via SUBCUTANEOUS
  Filled 2023-09-15 (×3): qty 0.12

## 2023-09-15 MED ORDER — INSULIN ASPART 100 UNIT/ML IJ SOLN
5.0000 [IU] | Freq: Three times a day (TID) | INTRAMUSCULAR | Status: DC
  Administered 2023-09-15 – 2023-09-16 (×6): 5 [IU] via SUBCUTANEOUS

## 2023-09-15 NOTE — Progress Notes (Signed)
 PROGRESS NOTE    Ariel Cobb  WUJ:[id number] DOB: 1945-12-01 DOA: 09/06/2023 PCP: Renford Dills, MD     Brief Narrative:  Ariel Cobb is a 78 years old female with PMH significant for tobacco abuse, COPD, paroxysmal A-fib on Eliquis, hypothyroidism, type 2 diabetes, CKD stage IIIb, peripheral artery disease, essential hypertension, hyperlipidemia, tobacco abuse, admitted under orthopedic service for elective right hip total arthroplasty. TRH is consulted for diabetic management.   New events last 24 hours / Subjective: Discussed with patient regarding continued adjustment of her insulin.  Patient's family member is at bedside, admits that patient does not eat much protein, does not eat eggs.  For breakfast, she had cereal and a muffin.  Assessment & Plan:   Principal Problem:   S/P total right hip arthroplasty Active Problems:   Avascular necrosis of hip, right (HCC)   Malnutrition of moderate degree   Type 2 diabetes mellitus with hyperglycemia postoperatively -Her hemoglobin A1c is 5.4.  Postoperatively, she has been dealing with some hyperglycemia.  Blood sugar today is much better.  Continue Lantus 12 units daily, NovoLog 5 units 3 times a day + sliding scale insulin.  Dosing was adjusted today -Dietitian consulted  Avascular necrosis of right hip s/p right hip arthroplasty -Per ortho -PT OT recommending SNF placement   HTN -Ramipril  HLD -Lipitor  Paroxysmal A fib -Eliquis  Hypothyroidism -Synthroid  CKD stage 3b -Baseline creatinine 1.4 -Stable   COPD -Without exacerbation -Ok with SpO2 >88%    Patient is stable to discharge to SNF from medical standpoint. TRH will continue to follow along daily for DM management.   Antimicrobials:  Anti-infectives (From admission, onward)    Start     Dose/Rate Route Frequency Ordered Stop   09/07/23 0600  ceFAZolin (ANCEF) IVPB 2g/100 mL premix        2 g 200 mL/hr over 30 Minutes Intravenous On call to  O.R. 09/06/23 1237 09/07/23 0630   09/06/23 2300  ceFAZolin (ANCEF) IVPB 2g/100 mL premix        2 g 200 mL/hr over 30 Minutes Intravenous Every 6 hours 09/06/23 2047 09/07/23 0448   09/06/23 1800  vancomycin (VANCOCIN) powder  Status:  Discontinued          As needed 09/06/23 1724 09/06/23 2237        Objective: Vitals:   09/14/23 2014 09/14/23 2105 09/15/23 0624 09/15/23 1045  BP:  (!) 158/50 (!) 141/52   Pulse:  78 66   Resp:  17 15   Temp:  98.3 F (36.8 C) 98.4 F (36.9 C)   TempSrc:  Oral Oral   SpO2: 92% 94% 90% 93%  Weight:      Height:        Intake/Output Summary (Last 24 hours) at 09/15/2023 1054 Last data filed at 09/15/2023 1009 Gross per 24 hour  Intake 780 ml  Output 1100 ml  Net -320 ml   Filed Weights   09/06/23 1337 09/07/23 0330  Weight: 49.9 kg 50.2 kg    Examination:  General exam: Appears calm and comfortable  Respiratory system: Respiratory effort normal. No respiratory distress. No conversational dyspnea.  Central nervous system: Alert and oriented. No focal neurological deficits. Speech clear.  Extremities: Symmetric in appearance  Skin: No rashes, lesions or ulcers on exposed skin  Psychiatry: Judgement and insight appear normal. Mood & affect appropriate.   Data Reviewed: I have personally reviewed following labs and imaging studies  CBC: Recent Labs  Lab  09/09/23 0002 09/10/23 1013 09/12/23 0335 09/13/23 0347  WBC  --  14.9* 12.8* 10.4  HGB 9.4* 9.8* 10.3* 9.0*  HCT 29.0* 30.6* 32.6* 28.0*  MCV  --  95.9 96.2 95.9  PLT  --  233 311 319   Basic Metabolic Panel: Recent Labs  Lab 09/11/23 1325 09/12/23 0335 09/13/23 0347 09/14/23 0942  NA 136 137 134* 132*  K 3.5 3.7 3.9 3.7  CL 102 100 99 96*  CO2 24 25 25 25   GLUCOSE 258* 243* 230* 198*  BUN 37* 39* 43* 47*  CREATININE 1.35* 1.16* 1.41* 1.65*  CALCIUM 8.5* 8.4* 7.9* 8.1*   GFR: Estimated Creatinine Clearance: 21.9 mL/min (A) (by C-G formula based on SCr of 1.65  mg/dL (H)). Liver Function Tests: No results for input(s): "AST", "ALT", "ALKPHOS", "BILITOT", "PROT", "ALBUMIN" in the last 168 hours. No results for input(s): "LIPASE", "AMYLASE" in the last 168 hours. No results for input(s): "AMMONIA" in the last 168 hours. Coagulation Profile: No results for input(s): "INR", "PROTIME" in the last 168 hours. Cardiac Enzymes: No results for input(s): "CKTOTAL", "CKMB", "CKMBINDEX", "TROPONINI" in the last 168 hours. BNP (last 3 results) No results for input(s): "PROBNP" in the last 8760 hours. HbA1C: No results for input(s): "HGBA1C" in the last 72 hours. CBG: Recent Labs  Lab 09/14/23 0821 09/14/23 1202 09/14/23 1726 09/14/23 2107 09/15/23 0715  GLUCAP 173* 272* 364* 308* 204*   Lipid Profile: No results for input(s): "CHOL", "HDL", "LDLCALC", "TRIG", "CHOLHDL", "LDLDIRECT" in the last 72 hours. Thyroid Function Tests: No results for input(s): "TSH", "T4TOTAL", "FREET4", "T3FREE", "THYROIDAB" in the last 72 hours. Anemia Panel: No results for input(s): "VITAMINB12", "FOLATE", "FERRITIN", "TIBC", "IRON", "RETICCTPCT" in the last 72 hours. Sepsis Labs: No results for input(s): "PROCALCITON", "LATICACIDVEN" in the last 168 hours.  No results found for this or any previous visit (from the past 240 hours).    Radiology Studies: No results found.    Scheduled Meds:  apixaban  2.5 mg Oral BID   ascorbic acid  500 mg Oral Q lunch   atorvastatin  80 mg Oral q morning   docusate sodium  100 mg Oral BID   feeding supplement  237 mL Oral BID BM   ferrous sulfate  325 mg Oral Daily   guaiFENesin  600 mg Oral BID   insulin aspart  0-5 Units Subcutaneous QHS   insulin aspart  0-9 Units Subcutaneous TID WC   insulin aspart  5 Units Subcutaneous TID WC   insulin glargine  12 Units Subcutaneous Daily   levothyroxine  75 mcg Oral Q0600   mometasone-formoterol  2 puff Inhalation BID   multivitamin with minerals  1 tablet Oral Daily    pantoprazole  40 mg Oral Daily   ramipril  2.5 mg Oral Daily   senna  1 tablet Oral BID   zinc sulfate (50mg  elemental zinc)  220 mg Oral Q lunch   Continuous Infusions:   LOS: 7 days   Time spent: 25 minutes   Noralee Stain, DO [facility] 09/15/2023, 10:54 AM   Available via Epic secure chat 7am-7pm After these hours, please refer to coverage provider listed on [email or web address]

## 2023-09-15 NOTE — Progress Notes (Signed)
    Subjective:  Patient reports pain as mild to moderate.  Denies N/V/CP/SOB/Abd pain.  Continued adjustment with blood glucose. Continue to monitor and adjustments as needed.  Placement to [facility].    Objective:   VITALS:   Vitals:   09/14/23 1419 09/14/23 2014 09/14/23 2105 09/15/23 0624  BP: (!) 126/50  (!) 158/50 (!) 141/52  Pulse: 77  78 66  Resp: 16  17 15   Temp: 98 F (36.7 C)  98.3 F (36.8 C) 98.4 F (36.9 C)  TempSrc:   Oral Oral  SpO2: 91% 92% 94% 90%  Weight:      Height:        NAD Neurologically intact ABD soft Neurovascular intact Sensation intact distally Intact pulses distally Dorsiflexion/Plantar flexion intact Incision: scant drainage No cellulitis present Compartment soft Thigh edema present. Soft and compressible.   Lab Results  Component Value Date   WBC 10.4 09/13/2023   HGB 9.0 (L) 09/13/2023   HCT 28.0 (L) 09/13/2023   MCV 95.9 09/13/2023   PLT 319 09/13/2023   BMET    Component Value Date/Time   NA 132 (L) 09/14/2023 0942   NA 142 01/30/2023 1001   K 3.7 09/14/2023 0942   CL 96 (L) 09/14/2023 0942   CO2 25 09/14/2023 0942   GLUCOSE 198 (H) 09/14/2023 0942   BUN 47 (H) 09/14/2023 0942   BUN 31 (H) 01/30/2023 1001   CREATININE 1.65 (H) 09/14/2023 0942   CREATININE 1.42 (H) 07/24/2013 1236   CALCIUM 8.1 (L) 09/14/2023 0942   EGFR 42 (L) 01/30/2023 1001   GFRNONAA 31 (L) 09/14/2023 0942     Assessment/Plan: 9 Days Post-Op   Principal Problem:   S/P total right hip arthroplasty Active Problems:   Avascular necrosis of hip, right (HCC)   Malnutrition of moderate degree   Touchdown weightbearing RLE with walker DVT ppx:  low dose Eliquis SCDs, TEDS PO pain control PT/OT: Continue PT/OT for TDWB and mobility.  Dispo:  - Patient has baseline orthostatic hypotension and dizziness.Continue PT/OT - Patient reluctant about SNF but agreeable to SNF due to TDWB restrictions and self care at home. D/c to [facility].  -  Continue increase in insulin again today. Appreciate diabetes coordinator and [facility] consult for further recommendations. Blood glucose better controlled today. Will continue to monitor. Goal blood glucose <200 for wound healing.    Clois Dupes 09/15/2023, 8:21 AM   [facility]  [place] [address], [address], [place], [place] [address] Phone: [phone] [email or web address] Facebook  [employer]

## 2023-09-15 NOTE — Plan of Care (Signed)
 Problem: Pain Managment: Goal: General experience of comfort will improve and/or be controlled Outcome: Progressing   Problem: Coping: Goal: Level of anxiety will decrease Outcome: Progressing   Problem: Clinical Measurements: Goal: Postoperative complications will be avoided or minimized Outcome: Progressing   Haydee Salter, RN 09/15/23 4:42 PM

## 2023-09-15 NOTE — TOC Progression Note (Signed)
 Transition of Care [facility]) - Progression Note    Patient Details  Name: Ariel Cobb MRN: [id number] Date of Birth: April 11, 1946  Transition of [facility]) CM/SW Contact  Howell Rucks, RN Phone Number: 09/15/2023, 10:57 AM  Clinical Narrative:  Per [facility] admissions coordinator, able to accept weekend admissions, call main facility number.      Barriers to Discharge: No Barriers Identified  Expected Discharge Plan and Services                         DME Arranged: N/A DME Agency: NA                   Social Determinants of Health (SDOH) Interventions SDOH Screenings   Food Insecurity: No Food Insecurity (09/07/2023)  Housing: Low Risk  (09/07/2023)  Transportation Needs: No Transportation Needs (09/07/2023)  Utilities: Not At Risk (09/07/2023)  Social Connections: Moderately Isolated (09/07/2023)  Tobacco Use: High Risk (09/06/2023)    Readmission Risk Interventions    09/11/2023    2:29 PM  Readmission Risk Prevention Plan  Post Dischage Appt Complete  Medication Screening Complete  Transportation Screening Complete

## 2023-09-15 NOTE — Progress Notes (Signed)
 Nutrition Note  RD consulted for wound healing.  Initial assessment completed 3/20. Nutrition was addressed. Pt ordered Magic cups with meals at that time.   -Pt already ordered Vitamin C and Zinc supplements. Will change zinc supplements for 14 days (to lessen risk of copper deficiency).  -Will order Glucerna shakes for additional kcals and protein. CBGs ranging between 204-364. -Will place Carbohydrate Counting handout in AVS to help with blood sugar control.  Will continue to monitor for needs.  Tilda Franco, MS, RD, LDN Inpatient Clinical Dietitian Contact via Secure chat

## 2023-09-15 NOTE — Progress Notes (Signed)
 Physical Therapy Treatment Patient Details Name: Ariel Cobb MRN: [id number] DOB: 05/29/1946 Today's Date: 09/15/2023   History of Present Illness 78 yo female  s/p Conversion of previous surgery to Right total hip arthroplasty, anterior approach. Pt with Type 2 diabetes mellitus with hyperglycemia postoperatively.   PMH: arthritis, HTN, HLD, PAD,  R hip fx-s/p IM nail 2023, s/p removal of hardware 06/13/24, AVN R hip    PT Comments  Pt able to recall TDWB status.  Pt reports overall feeling poorly today, has been battling constant nausea per her report.  Pt agreeable to attempt sitting EOB and requiring max +2 assist for bed mobility today.  Pt also reports SOB with sitting EOB, SPO2 91% on room air.  Upon return to supine, pt reports feeling "stopped up" with drainage.  Pt states yes when questioned about having seasonal allergies.  Pt reports overall not feeling well today.  Current d/c plan appropriate. Patient will benefit from continued inpatient follow up therapy, <3 hours/day.     If plan is discharge home, recommend the following: A lot of help with walking and/or transfers;A lot of help with bathing/dressing/bathroom;Assistance with cooking/housework;Assist for transportation;Help with stairs or ramp for entrance   Can travel by private vehicle     No  Equipment Recommendations  Wheelchair (measurements PT)    Recommendations for Other Services       Precautions / Restrictions Precautions Precautions: Fall Recall of Precautions/Restrictions: Intact Restrictions Weight Bearing Restrictions Per Provider Order: Yes RLE Weight Bearing Per Provider Order: Touchdown weight bearing     Mobility  Bed Mobility Overal bed mobility: Needs Assistance Bed Mobility: Supine to Sit, Sit to Supine     Supine to sit: Max assist, +2 for safety/equipment, +2 for physical assistance Sit to supine: Max assist, +2 for safety/equipment, +2 for physical assistance   General bed  mobility comments: assist for upper and lower body due to nausea and pain    Transfers                        Ambulation/Gait                   Stairs             Wheelchair Mobility     Tilt Bed    Modified Rankin (Stroke Patients Only)       Balance                                            Communication Communication Communication: No apparent difficulties  Cognition Arousal: Alert Behavior During Therapy: WFL for tasks assessed/performed   PT - Cognitive impairments: No apparent impairments                         Following commands: Intact      Cueing    Exercises      General Comments        Pertinent Vitals/Pain Pain Assessment Pain Assessment: Faces Faces Pain Scale: Hurts even more Pain Location: R hip (with activity) Pain Descriptors / Indicators: Sore, Guarding, Grimacing Pain Intervention(s): Monitored during session, Repositioned    Home Living                          Prior Function  PT Goals (current goals can now be found in the care plan section) Progress towards PT goals: Progressing toward goals    Frequency    Min 5X/week      PT Plan      Co-evaluation              AM-PAC PT "6 Clicks" Mobility   Outcome Measure  Help needed turning from your back to your side while in a flat bed without using bedrails?: A Lot Help needed moving from lying on your back to sitting on the side of a flat bed without using bedrails?: A Lot Help needed moving to and from a bed to a chair (including a wheelchair)?: A Lot Help needed standing up from a chair using your arms (e.g., wheelchair or bedside chair)?: A Lot Help needed to walk in hospital room?: Total Help needed climbing 3-5 steps with a railing? : Total 6 Click Score: 10    End of Session   Activity Tolerance: Patient limited by pain;Patient limited by fatigue Patient left: with call  bell/phone within reach;with bed alarm set;in bed Nurse Communication: Other (comment) PT Visit Diagnosis: Other abnormalities of gait and mobility (R26.89)     Time: 0454-0981 PT Time Calculation (min) (ACUTE ONLY): 18 min  Charges:    $Therapeutic Activity: 8-22 mins PT General Charges $$ ACUTE PT VISIT: 1 Visit                     Paulino Door, DPT Physical Therapist Acute Rehabilitation Services Office: [phone]    Janan Halter [place] 09/15/2023, 3:51 PM

## 2023-09-16 DIAGNOSIS — Z96641 Presence of right artificial hip joint: Secondary | ICD-10-CM | POA: Diagnosis not present

## 2023-09-16 LAB — GLUCOSE, CAPILLARY
Glucose-Capillary: 131 mg/dL — ABNORMAL HIGH (ref 70–99)
Glucose-Capillary: 139 mg/dL — ABNORMAL HIGH (ref 70–99)
Glucose-Capillary: 274 mg/dL — ABNORMAL HIGH (ref 70–99)
Glucose-Capillary: 260 mg/dL — ABNORMAL HIGH (ref 70–99)
Glucose-Capillary: 154 mg/dL — ABNORMAL HIGH (ref 70–99)

## 2023-09-16 NOTE — Progress Notes (Signed)
 PROGRESS NOTE    Ariel Cobb  ZOX:[id number] DOB: December 23, 1945 DOA: 09/06/2023 PCP: Renford Dills, MD     Brief Narrative:  Ariel Cobb is a 78 years old female with PMH significant for tobacco abuse, COPD, paroxysmal A-fib on Eliquis, hypothyroidism, type 2 diabetes, CKD stage IIIb, peripheral artery disease, essential hypertension, hyperlipidemia, tobacco abuse, admitted under orthopedic service for elective right hip total arthroplasty. TRH is consulted for diabetic management.   New events last 24 hours / Subjective: Had some nausea this morning, currently eating breakfast on my exam.  Blood sugars are much better controlled.  Assessment & Plan:   Principal Problem:   S/P total right hip arthroplasty Active Problems:   Avascular necrosis of hip, right (HCC)   Malnutrition of moderate degree   Type 2 diabetes mellitus with hyperglycemia postoperatively -Her hemoglobin A1c is 5.4.  Postoperatively, she has been dealing with some hyperglycemia.  Blood sugar today is much better.  Continue Lantus 12 units daily, NovoLog 5 units 3 times a day + sliding scale insulin.   -Dietitian consulted  Avascular necrosis of right hip s/p right hip arthroplasty -Per ortho -PT OT recommending SNF placement   HTN -Ramipril  HLD -Lipitor  Paroxysmal A fib -Eliquis  Hypothyroidism -Synthroid  CKD stage 3b -Baseline creatinine 1.4 -Stable   COPD -Without exacerbation -Ok with SpO2 >88%    Patient is stable to discharge to SNF from medical standpoint. TRH will continue to follow along daily for DM management.   Antimicrobials:  Anti-infectives (From admission, onward)    Start     Dose/Rate Route Frequency Ordered Stop   09/07/23 0600  ceFAZolin (ANCEF) IVPB 2g/100 mL premix        2 g 200 mL/hr over 30 Minutes Intravenous On call to O.R. 09/06/23 1237 09/07/23 0630   09/06/23 2300  ceFAZolin (ANCEF) IVPB 2g/100 mL premix        2 g 200 mL/hr over 30 Minutes  Intravenous Every 6 hours 09/06/23 2047 09/07/23 0448   09/06/23 1800  vancomycin (VANCOCIN) powder  Status:  Discontinued          As needed 09/06/23 1724 09/06/23 2237        Objective: Vitals:   09/16/23 0410 09/16/23 0545 09/16/23 0553 09/16/23 1006  BP:      Pulse:  (P) 74    Resp:  (P) 16    Temp:      TempSrc:      SpO2: 90% (P) 91% (P) 96% 92%  Weight:      Height:        Intake/Output Summary (Last 24 hours) at 09/16/2023 1252 Last data filed at 09/16/2023 0900 Gross per 24 hour  Intake 640 ml  Output 350 ml  Net 290 ml   Filed Weights   09/06/23 1337 09/07/23 0330  Weight: 49.9 kg 50.2 kg    Examination:  General exam: Appears calm and comfortable  Respiratory system: Respiratory effort normal. No respiratory distress. No conversational dyspnea.  Central nervous system: Alert and oriented.   Data Reviewed: I have personally reviewed following labs and imaging studies  CBC: Recent Labs  Lab 09/10/23 1013 09/12/23 0335 09/13/23 0347  WBC 14.9* 12.8* 10.4  HGB 9.8* 10.3* 9.0*  HCT 30.6* 32.6* 28.0*  MCV 95.9 96.2 95.9  PLT 233 311 319   Basic Metabolic Panel: Recent Labs  Lab 09/11/23 1325 09/12/23 0335 09/13/23 0347 09/14/23 0942  NA 136 137 134* 132*  K 3.5 3.7  3.9 3.7  CL 102 100 99 96*  CO2 24 25 25 25   GLUCOSE 258* 243* 230* 198*  BUN 37* 39* 43* 47*  CREATININE 1.35* 1.16* 1.41* 1.65*  CALCIUM 8.5* 8.4* 7.9* 8.1*   GFR: Estimated Creatinine Clearance: 21.9 mL/min (A) (by C-G formula based on SCr of 1.65 mg/dL (H)). Liver Function Tests: No results for input(s): "AST", "ALT", "ALKPHOS", "BILITOT", "PROT", "ALBUMIN" in the last 168 hours. No results for input(s): "LIPASE", "AMYLASE" in the last 168 hours. No results for input(s): "AMMONIA" in the last 168 hours. Coagulation Profile: No results for input(s): "INR", "PROTIME" in the last 168 hours. Cardiac Enzymes: No results for input(s): "CKTOTAL", "CKMB", "CKMBINDEX", "TROPONINI"  in the last 168 hours. BNP (last 3 results) No results for input(s): "PROBNP" in the last 8760 hours. HbA1C: No results for input(s): "HGBA1C" in the last 72 hours. CBG: Recent Labs  Lab 09/15/23 1628 09/15/23 2103 09/16/23 0355 09/16/23 0725 09/16/23 1153  GLUCAP 117* 114* 131* 139* 274*   Lipid Profile: No results for input(s): "CHOL", "HDL", "LDLCALC", "TRIG", "CHOLHDL", "LDLDIRECT" in the last 72 hours. Thyroid Function Tests: No results for input(s): "TSH", "T4TOTAL", "FREET4", "T3FREE", "THYROIDAB" in the last 72 hours. Anemia Panel: No results for input(s): "VITAMINB12", "FOLATE", "FERRITIN", "TIBC", "IRON", "RETICCTPCT" in the last 72 hours. Sepsis Labs: No results for input(s): "PROCALCITON", "LATICACIDVEN" in the last 168 hours.  No results found for this or any previous visit (from the past 240 hours).    Radiology Studies: No results found.    Scheduled Meds:  apixaban  2.5 mg Oral BID   ascorbic acid  500 mg Oral Q lunch   atorvastatin  80 mg Oral q morning   docusate sodium  100 mg Oral BID   feeding supplement  237 mL Oral BID BM   feeding supplement (GLUCERNA SHAKE)  237 mL Oral TID BM   guaiFENesin  600 mg Oral BID   insulin aspart  0-5 Units Subcutaneous QHS   insulin aspart  0-9 Units Subcutaneous TID WC   insulin aspart  5 Units Subcutaneous TID WC   insulin glargine  12 Units Subcutaneous Daily   levothyroxine  75 mcg Oral Q0600   mometasone-formoterol  2 puff Inhalation BID   multivitamin with minerals  1 tablet Oral Daily   pantoprazole  40 mg Oral Daily   ramipril  2.5 mg Oral Daily   senna  1 tablet Oral BID   zinc sulfate (50mg  elemental zinc)  220 mg Oral Q lunch   Continuous Infusions:   LOS: 8 days   Time spent: 20 minutes   Noralee Stain, DO [facility] 09/16/2023, 12:52 PM   Available via Epic secure chat 7am-7pm After these hours, please refer to coverage provider listed on [email or web address]

## 2023-09-16 NOTE — Plan of Care (Signed)
  Problem: Education: Goal: Knowledge of General Education information will improve Description: Including pain rating scale, medication(s)/side effects and non-pharmacologic comfort measures Outcome: Progressing   Problem: Pain Managment: Goal: General experience of comfort will improve and/or be controlled Outcome: Progressing

## 2023-09-16 NOTE — Progress Notes (Signed)
    Subjective:  Patient reports pain as mild to moderate.  Denies V/CP/SOB. She reports some nausea today, she did receive some zofran this morning. Feeling slightly better no emesis. Denies any tingling or numbness in LE bilaterally. New incentive spirometry provided today.  Blood glucose better controlled today.  Placement to [facility].   Objective:   VITALS:   Vitals:   09/16/23 0403 09/16/23 0410 09/16/23 0545 09/16/23 0553  BP: (!) 147/66     Pulse: 76  (P) 74   Resp: 16  (P) 16   Temp: 98.7 F (37.1 C)     TempSrc: (P) Oral     SpO2: 90% 90% (P) 91% (P) 96%  Weight:      Height:        NAD Neurologically intact ABD soft Neurovascular intact Sensation intact distally Intact pulses distally Dorsiflexion/Plantar flexion intact Incision: scant drainage No cellulitis present Compartment soft Thigh edema present. Soft and compressible.    Lab Results  Component Value Date   WBC 10.4 09/13/2023   HGB 9.0 (L) 09/13/2023   HCT 28.0 (L) 09/13/2023   MCV 95.9 09/13/2023   PLT 319 09/13/2023   BMET    Component Value Date/Time   NA 132 (L) 09/14/2023 0942   NA 142 01/30/2023 1001   K 3.7 09/14/2023 0942   CL 96 (L) 09/14/2023 0942   CO2 25 09/14/2023 0942   GLUCOSE 198 (H) 09/14/2023 0942   BUN 47 (H) 09/14/2023 0942   BUN 31 (H) 01/30/2023 1001   CREATININE 1.65 (H) 09/14/2023 0942   CREATININE 1.42 (H) 07/24/2013 1236   CALCIUM 8.1 (L) 09/14/2023 0942   EGFR 42 (L) 01/30/2023 1001   GFRNONAA 31 (L) 09/14/2023 0942     Assessment/Plan: 10 Days Post-Op   Principal Problem:   S/P total right hip arthroplasty Active Problems:   Avascular necrosis of hip, right (HCC)   Malnutrition of moderate degree   Touchdown weightbearing RLE with walker DVT ppx:  low dose Eliquis SCDs, TEDS PO pain control PT/OT: Continue PT/OT for TDWB and mobility.  Dispo:  - Patient has baseline orthostatic hypotension and dizziness.Continue PT/OT - Patient reluctant  about SNF but agreeable to SNF due to TDWB restrictions and self care at home. D/c to [facility].  - Better glucose control today. Appreciate diabetes coordinator and [facility] consult for further recommendations. Will continue to monitor. Goal blood glucose <200 for wound healing. Once better controlled d/c to [facility] hopefully tomorrow.    Clois Dupes 09/16/2023, 9:36 AM   [facility]  [place] [address], [address], [place], [place] [address] Phone: [phone] [email or web address] Facebook  [employer]

## 2023-09-16 NOTE — Plan of Care (Signed)
 Problem: Clinical Measurements: Goal: Ability to maintain clinical measurements within normal limits will improve Outcome: Progressing   Problem: Coping: Goal: Level of anxiety will decrease Outcome: Progressing  Problem: Safety: Goal: Ability to remain free from injury will improve Outcome: Progressing   Haydee Salter, RN 09/16/23 5:22 PM

## 2023-09-16 NOTE — Progress Notes (Signed)
 Patient has declined pain medication throughout the day despite occasional nonverbal indicators of discomfort. Education, emotional support, and environmental interventions done. Haydee Salter, RN 09/16/23 6:08 PM

## 2023-09-17 DIAGNOSIS — Z96641 Presence of right artificial hip joint: Secondary | ICD-10-CM | POA: Diagnosis not present

## 2023-09-17 LAB — GLUCOSE, CAPILLARY
Glucose-Capillary: 179 mg/dL — ABNORMAL HIGH (ref 70–99)
Glucose-Capillary: 182 mg/dL — ABNORMAL HIGH (ref 70–99)
Glucose-Capillary: 111 mg/dL — ABNORMAL HIGH (ref 70–99)
Glucose-Capillary: 222 mg/dL — ABNORMAL HIGH (ref 70–99)

## 2023-09-17 MED ORDER — BASAGLAR KWIKPEN 100 UNIT/ML ~~LOC~~ SOPN: 15.0000 [IU] | PEN_INJECTOR | Freq: Every day | SUBCUTANEOUS | 0 refills | Status: DC

## 2023-09-17 MED ORDER — NOVOLOG FLEXPEN 100 UNIT/ML ~~LOC~~ SOPN: 5.0000 [IU] | PEN_INJECTOR | Freq: Three times a day (TID) | SUBCUTANEOUS | 0 refills | Status: DC

## 2023-09-17 MED ORDER — INSULIN GLARGINE 100 UNIT/ML ~~LOC~~ SOLN
15.0000 [IU] | Freq: Every day | SUBCUTANEOUS | Status: DC
  Administered 2023-09-17 – 2023-09-18 (×2): 15 [IU] via SUBCUTANEOUS
  Filled 2023-09-17 (×2): qty 0.15

## 2023-09-17 MED ORDER — INSULIN ASPART 100 UNIT/ML IJ SOLN
5.0000 [IU] | Freq: Three times a day (TID) | INTRAMUSCULAR | Status: DC
  Administered 2023-09-17 – 2023-09-18 (×4): 5 [IU] via SUBCUTANEOUS

## 2023-09-17 MED ORDER — INSULIN ASPART 100 UNIT/ML IJ SOLN: 7.0000 [IU] | Freq: Three times a day (TID) | INTRAMUSCULAR | Status: DC

## 2023-09-17 NOTE — Plan of Care (Signed)
  Problem: Activity: Goal: Risk for activity intolerance will decrease Outcome: Progressing   Problem: Coping: Goal: Level of anxiety will decrease Outcome: Progressing   Problem: Skin Integrity: Goal: Risk for impaired skin integrity will decrease Outcome: Progressing   Problem: Metabolic: Goal: Ability to maintain appropriate glucose levels will improve Outcome: Progressing   Cristela Blue, Student-RN 09/17/23 5:03 PM

## 2023-09-17 NOTE — Progress Notes (Addendum)
 PROGRESS NOTE    Ariel Cobb  ZOX:[id number] DOB: 09/05/1945 DOA: 09/06/2023 PCP: Renford Dills, MD     Brief Narrative:  Ariel Cobb is a 78 years old female with PMH significant for tobacco abuse, COPD, paroxysmal A-fib on Eliquis, hypothyroidism, type 2 diabetes, CKD stage IIIb, peripheral artery disease, essential hypertension, hyperlipidemia, tobacco abuse, admitted under orthopedic service for elective right hip total arthroplasty. TRH is consulted for diabetic management.   New events last 24 hours / Subjective: No new issues.  Blood sugars are improved overall.  Assessment & Plan:   Principal Problem:   S/P total right hip arthroplasty Active Problems:   Avascular necrosis of hip, right (HCC)   Malnutrition of moderate degree   Type 2 diabetes mellitus with hyperglycemia postoperatively -Her hemoglobin A1c is 5.4.  Postoperatively, she has been dealing with some hyperglycemia.   -Continue Lantus 15 units daily, NovoLog 5 units 3 times a day + sliding scale insulin.   -Dietitian consulted  Avascular necrosis of right hip s/p right hip arthroplasty -Per ortho -PT OT recommending SNF placement   HTN -Ramipril  HLD -Lipitor  Paroxysmal A fib -Eliquis  Hypothyroidism -Synthroid  CKD stage 3b -Baseline creatinine 1.4 -Stable   COPD -Without exacerbation -Ok with SpO2 >88%    Patient is stable to discharge to SNF from medical standpoint. TRH will continue to follow along daily for DM management.  If discharged today, she may increase her home insulin to Lantus 15 units daily, NovoLog 5 to 8 units TID  with meals daily. These medications are ordered on discharge navigator.   Antimicrobials:  Anti-infectives (From admission, onward)    Start     Dose/Rate Route Frequency Ordered Stop   09/07/23 0600  ceFAZolin (ANCEF) IVPB 2g/100 mL premix        2 g 200 mL/hr over 30 Minutes Intravenous On call to O.R. 09/06/23 1237 09/07/23 0630   09/06/23  2300  ceFAZolin (ANCEF) IVPB 2g/100 mL premix        2 g 200 mL/hr over 30 Minutes Intravenous Every 6 hours 09/06/23 2047 09/07/23 0448   09/06/23 1800  vancomycin (VANCOCIN) powder  Status:  Discontinued          As needed 09/06/23 1724 09/06/23 2237        Objective: Vitals:   09/16/23 1354 09/16/23 2050 09/16/23 2100 09/17/23 0457  BP: (!) 130/58  (!) 154/62 (!) 146/60  Pulse: 80  78 70  Resp: 16  16 18   Temp: 98.5 F (36.9 C)  98 F (36.7 C) 97.8 F (36.6 C)  TempSrc: Oral     SpO2: 97% 99% 96% 98%  Weight:      Height:        Intake/Output Summary (Last 24 hours) at 09/17/2023 1051 Last data filed at 09/17/2023 0900 Gross per 24 hour  Intake 420 ml  Output 850 ml  Net -430 ml   Filed Weights   09/06/23 1337 09/07/23 0330  Weight: 49.9 kg 50.2 kg    Examination:  General exam: Appears calm and comfortable  Respiratory system: Respiratory effort normal. No respiratory distress. No conversational dyspnea.  Central nervous system: Alert and oriented.   Data Reviewed: I have personally reviewed following labs and imaging studies  CBC: Recent Labs  Lab 09/12/23 0335 09/13/23 0347  WBC 12.8* 10.4  HGB 10.3* 9.0*  HCT 32.6* 28.0*  MCV 96.2 95.9  PLT 311 319   Basic Metabolic Panel: Recent Labs  Lab  09/11/23 1325 09/12/23 0335 09/13/23 0347 09/14/23 0942  NA 136 137 134* 132*  K 3.5 3.7 3.9 3.7  CL 102 100 99 96*  CO2 24 25 25 25   GLUCOSE 258* 243* 230* 198*  BUN 37* 39* 43* 47*  CREATININE 1.35* 1.16* 1.41* 1.65*  CALCIUM 8.5* 8.4* 7.9* 8.1*   GFR: Estimated Creatinine Clearance: 21.9 mL/min (A) (by C-G formula based on SCr of 1.65 mg/dL (H)). Liver Function Tests: No results for input(s): "AST", "ALT", "ALKPHOS", "BILITOT", "PROT", "ALBUMIN" in the last 168 hours. No results for input(s): "LIPASE", "AMYLASE" in the last 168 hours. No results for input(s): "AMMONIA" in the last 168 hours. Coagulation Profile: No results for input(s): "INR",  "PROTIME" in the last 168 hours. Cardiac Enzymes: No results for input(s): "CKTOTAL", "CKMB", "CKMBINDEX", "TROPONINI" in the last 168 hours. BNP (last 3 results) No results for input(s): "PROBNP" in the last 8760 hours. HbA1C: No results for input(s): "HGBA1C" in the last 72 hours. CBG: Recent Labs  Lab 09/16/23 0725 09/16/23 1153 09/16/23 1659 09/16/23 2109 09/17/23 0731  GLUCAP 139* 274* 260* 154* 179*   Lipid Profile: No results for input(s): "CHOL", "HDL", "LDLCALC", "TRIG", "CHOLHDL", "LDLDIRECT" in the last 72 hours. Thyroid Function Tests: No results for input(s): "TSH", "T4TOTAL", "FREET4", "T3FREE", "THYROIDAB" in the last 72 hours. Anemia Panel: No results for input(s): "VITAMINB12", "FOLATE", "FERRITIN", "TIBC", "IRON", "RETICCTPCT" in the last 72 hours. Sepsis Labs: No results for input(s): "PROCALCITON", "LATICACIDVEN" in the last 168 hours.  No results found for this or any previous visit (from the past 240 hours).    Radiology Studies: No results found.    Scheduled Meds:  apixaban  2.5 mg Oral BID   ascorbic acid  500 mg Oral Q lunch   atorvastatin  80 mg Oral q morning   docusate sodium  100 mg Oral BID   feeding supplement (GLUCERNA SHAKE)  237 mL Oral TID BM   guaiFENesin  600 mg Oral BID   insulin aspart  0-5 Units Subcutaneous QHS   insulin aspart  0-9 Units Subcutaneous TID WC   insulin aspart  5 Units Subcutaneous TID WC   insulin glargine  15 Units Subcutaneous Daily   levothyroxine  75 mcg Oral Q0600   mometasone-formoterol  2 puff Inhalation BID   multivitamin with minerals  1 tablet Oral Daily   pantoprazole  40 mg Oral Daily   ramipril  2.5 mg Oral Daily   senna  1 tablet Oral BID   zinc sulfate (50mg  elemental zinc)  220 mg Oral Q lunch   Continuous Infusions:   LOS: 9 days   Time spent: 20 minutes   Noralee Stain, DO [facility] 09/17/2023, 10:51 AM   Available via Epic secure chat 7am-7pm After these hours, please  refer to coverage provider listed on [email or web address]

## 2023-09-17 NOTE — Progress Notes (Signed)
   Subjective: 11 Days Post-Op Procedure(s) (LRB): ARTHROPLASTY, HIP, TOTAL, ANTERIOR APPROACH (Right) Patient seen in rounds for Dr. Linna Caprice. Patient states she is pretty sore this morning and painful. Also endorses constipation. Reports she had a bowel movement overnight but RN unaware. Patient to d/c to [facility].  Objective: Vital signs in last 24 hours: Temp:  [97.8 F (36.6 C)-98.5 F (36.9 C)] 97.8 F (36.6 C) (03/23 0457) Pulse Rate:  [70-80] 70 (03/23 0457) Resp:  [16-18] 18 (03/23 0457) BP: (130-154)/(58-62) 146/60 (03/23 0457) SpO2:  [92 %-99 %] 98 % (03/23 0457) FiO2 (%):  [28 %] 28 % (03/22 1006)  Intake/Output from previous day:  Intake/Output Summary (Last 24 hours) at 09/17/2023 0927 Last data filed at 09/17/2023 0900 Gross per 24 hour  Intake 420 ml  Output 850 ml  Net -430 ml    Intake/Output this shift: Total I/O In: -  Out: 450 [Urine:450]  Labs: No results for input(s): "HGB" in the last 72 hours. No results for input(s): "WBC", "RBC", "HCT", "PLT" in the last 72 hours. Recent Labs    09/14/23 0942  NA 132*  K 3.7  CL 96*  CO2 25  BUN 47*  CREATININE 1.65*  GLUCOSE 198*  CALCIUM 8.1*   No results for input(s): "LABPT", "INR" in the last 72 hours.  Exam: General - Patient is Alert and Oriented Extremity - Neurologically intact Neurovascular intact Sensation intact distally Dorsiflexion/Plantar flexion intact Dressing/Incision - clean, dry, no drainage Motor Function - intact, moving foot and toes well on exam.  Past Medical History:  Diagnosis Date   A-fib (HCC)    Anemia    Arthritis    Chronic bronchitis (HCC)    Chronic kidney disease    sees dr sanford yearly stage 3 stable   Claudication (HCC)    Complication of anesthesia 2000   slow to awaken after hip surgery   COPD (chronic obstructive pulmonary disease) (HCC)    Depression    Diabetes mellitus without complication (HCC)    type 1   Dysrhythmia    Hyperlipidemia     Hypertension    Hypothyroidism    Impetigo    Neuromuscular disorder (HCC)    peripheral neuropathy   Osteoporosis    PAD (peripheral artery disease) (HCC)    a. 07/29/13: s/p PV angiogram with successful diamondback orbital rotational atherectomy of the high-grade calcified R popliteal artery stenosis   Shortness of breath    on exertion   Thyroid disease     Assessment/Plan: 11 Days Post-Op Procedure(s) (LRB): ARTHROPLASTY, HIP, TOTAL, ANTERIOR APPROACH (Right) Principal Problem:   S/P total right hip arthroplasty Active Problems:   Avascular necrosis of hip, right (HCC)   Malnutrition of moderate degree  Estimated body mass index is 19.6 kg/m as calculated from the following:   Height as of this encounter: 5\' 3"  (1.6 m).   Weight as of this encounter: 50.2 kg.  Touchdown weightbearing RLE with walker DVT ppx:  low dose Eliquis, SCDs, TEDS PO pain control PT/OT: Continue PT/OT for TDWB and mobility.   Possible d/c to [facility] today but may require additional night(s) for glucose control and mobility.  R. Arcola Jansky, PA-C Orthopedic Surgery 09/17/2023, 9:27 AM

## 2023-09-18 DIAGNOSIS — Z9889 Other specified postprocedural states: Secondary | ICD-10-CM | POA: Diagnosis not present

## 2023-09-18 DIAGNOSIS — L89152 Pressure ulcer of sacral region, stage 2: Secondary | ICD-10-CM | POA: Diagnosis not present

## 2023-09-18 DIAGNOSIS — R059 Cough, unspecified: Secondary | ICD-10-CM | POA: Diagnosis not present

## 2023-09-18 DIAGNOSIS — R1314 Dysphagia, pharyngoesophageal phase: Secondary | ICD-10-CM | POA: Diagnosis not present

## 2023-09-18 DIAGNOSIS — Z96641 Presence of right artificial hip joint: Secondary | ICD-10-CM | POA: Diagnosis not present

## 2023-09-18 DIAGNOSIS — M87051 Idiopathic aseptic necrosis of right femur: Secondary | ICD-10-CM | POA: Diagnosis not present

## 2023-09-18 DIAGNOSIS — Z7901 Long term (current) use of anticoagulants: Secondary | ICD-10-CM | POA: Diagnosis not present

## 2023-09-18 DIAGNOSIS — J9601 Acute respiratory failure with hypoxia: Secondary | ICD-10-CM | POA: Diagnosis not present

## 2023-09-18 DIAGNOSIS — J449 Chronic obstructive pulmonary disease, unspecified: Secondary | ICD-10-CM | POA: Diagnosis not present

## 2023-09-18 DIAGNOSIS — Z471 Aftercare following joint replacement surgery: Secondary | ICD-10-CM | POA: Diagnosis not present

## 2023-09-18 DIAGNOSIS — L89156 Pressure-induced deep tissue damage of sacral region: Secondary | ICD-10-CM | POA: Diagnosis not present

## 2023-09-18 DIAGNOSIS — I4819 Other persistent atrial fibrillation: Secondary | ICD-10-CM | POA: Diagnosis not present

## 2023-09-18 DIAGNOSIS — K5900 Constipation, unspecified: Secondary | ICD-10-CM | POA: Diagnosis not present

## 2023-09-18 DIAGNOSIS — M25551 Pain in right hip: Secondary | ICD-10-CM | POA: Diagnosis not present

## 2023-09-18 DIAGNOSIS — N179 Acute kidney failure, unspecified: Secondary | ICD-10-CM | POA: Diagnosis not present

## 2023-09-18 DIAGNOSIS — E440 Moderate protein-calorie malnutrition: Secondary | ICD-10-CM | POA: Diagnosis not present

## 2023-09-18 DIAGNOSIS — Z7401 Bed confinement status: Secondary | ICD-10-CM | POA: Diagnosis not present

## 2023-09-18 DIAGNOSIS — Z743 Need for continuous supervision: Secondary | ICD-10-CM | POA: Diagnosis not present

## 2023-09-18 DIAGNOSIS — R0602 Shortness of breath: Secondary | ICD-10-CM | POA: Diagnosis not present

## 2023-09-18 DIAGNOSIS — Z794 Long term (current) use of insulin: Secondary | ICD-10-CM | POA: Diagnosis not present

## 2023-09-18 DIAGNOSIS — R5383 Other fatigue: Secondary | ICD-10-CM | POA: Diagnosis not present

## 2023-09-18 DIAGNOSIS — R339 Retention of urine, unspecified: Secondary | ICD-10-CM | POA: Diagnosis not present

## 2023-09-18 DIAGNOSIS — F05 Delirium due to known physiological condition: Secondary | ICD-10-CM | POA: Diagnosis not present

## 2023-09-18 DIAGNOSIS — R051 Acute cough: Secondary | ICD-10-CM | POA: Diagnosis not present

## 2023-09-18 DIAGNOSIS — F17210 Nicotine dependence, cigarettes, uncomplicated: Secondary | ICD-10-CM | POA: Diagnosis not present

## 2023-09-18 DIAGNOSIS — I10 Essential (primary) hypertension: Secondary | ICD-10-CM | POA: Diagnosis not present

## 2023-09-18 DIAGNOSIS — E119 Type 2 diabetes mellitus without complications: Secondary | ICD-10-CM | POA: Diagnosis not present

## 2023-09-18 LAB — GLUCOSE, CAPILLARY
Glucose-Capillary: 116 mg/dL — ABNORMAL HIGH (ref 70–99)
Glucose-Capillary: 220 mg/dL — ABNORMAL HIGH (ref 70–99)

## 2023-09-18 NOTE — Plan of Care (Signed)
 Patient discharged to [facility] via [facility]. Report given to [facility] staff and to [place], facility nurse. AVS placed in discharge packet. Patient's sister Steward Drone notified per her request to make aware of ambulance arrival. Haydee Salter, RN 09/18/23 2:50 PM

## 2023-09-18 NOTE — Progress Notes (Addendum)
    Subjective:  Patient reports pain as mild to moderate.  Denies N/V/CP/SOB/Abd pain. Blood glucose much improved.  Discussed d/c to [facility] today.  She is doing much better today.   Objective:   VITALS:   Vitals:   09/17/23 1155 09/17/23 1406 09/17/23 2131 09/18/23 0517  BP: (!) 145/58 (!) 116/52 (!) 129/53 (!) 143/53  Pulse: 77 76 84 74  Resp: 18 17 17 17   Temp: 98.1 F (36.7 C) 98.4 F (36.9 C) 98.9 F (37.2 C) 98.3 F (36.8 C)  TempSrc: Oral Oral Oral Oral  SpO2: 96% 94% 92% 95%  Weight:      Height:        NAD Neurologically intact ABD soft Neurovascular intact Sensation intact distally Intact pulses distally Dorsiflexion/Plantar flexion intact Incision: scant drainage No cellulitis present Compartment soft Thigh edema present. Soft and compressible.   Lab Results  Component Value Date   WBC 10.4 09/13/2023   HGB 9.0 (L) 09/13/2023   HCT 28.0 (L) 09/13/2023   MCV 95.9 09/13/2023   PLT 319 09/13/2023   BMET    Component Value Date/Time   NA 132 (L) 09/14/2023 0942   NA 142 01/30/2023 1001   K 3.7 09/14/2023 0942   CL 96 (L) 09/14/2023 0942   CO2 25 09/14/2023 0942   GLUCOSE 198 (H) 09/14/2023 0942   BUN 47 (H) 09/14/2023 0942   BUN 31 (H) 01/30/2023 1001   CREATININE 1.65 (H) 09/14/2023 0942   CREATININE 1.42 (H) 07/24/2013 1236   CALCIUM 8.1 (L) 09/14/2023 0942   EGFR 42 (L) 01/30/2023 1001   GFRNONAA 31 (L) 09/14/2023 0942     Assessment/Plan: 12 Days Post-Op   Principal Problem:   S/P total right hip arthroplasty Active Problems:   Avascular necrosis of hip, right (HCC)   Malnutrition of moderate degree    Touchdown weightbearing RLE with walker DVT ppx:  low dose Eliquis SCDs, TEDS PO pain control PT/OT: Continue PT/OT for TDWB and mobility.  Dispo:  - Patient has baseline orthostatic hypotension and dizziness.Continue PT/OT - Better glucose control over the last few day. Appreciate diabetes coordinator and TRH. TRH  recommends increased home lantus to 15 units daily, and NovoLog 5 to 8 units TID with meals daily  - D/c to [facility] today.       Clois Dupes 09/18/2023, 7:53 AM   [facility]  [place] [address], [place], [place] [address] Phone: [phone] [email or web address] Facebook  [employer]

## 2023-09-18 NOTE — Plan of Care (Signed)
   Problem: Clinical Measurements: Goal: Respiratory complications will improve Outcome: Progressing

## 2023-09-18 NOTE — Progress Notes (Signed)
 PROGRESS NOTE    Ariel Cobb  NWG:[id number] DOB: Jun 24, 1946 DOA: 09/06/2023 PCP: Renford Dills, MD     Brief Narrative:  Ariel Cobb is a 78 years old female with PMH significant for tobacco abuse, COPD, paroxysmal A-fib on Eliquis, hypothyroidism, type 2 diabetes, CKD stage IIIb, peripheral artery disease, essential hypertension, hyperlipidemia, tobacco abuse, admitted under orthopedic service for elective right hip total arthroplasty. TRH is consulted for diabetic management.   New events last 24 hours / Subjective: Doing well   Assessment & Plan:   Principal Problem:   S/P total right hip arthroplasty Active Problems:   Avascular necrosis of hip, right (HCC)   Malnutrition of moderate degree   Type 2 diabetes mellitus with hyperglycemia postoperatively -Her hemoglobin A1c is 5.4.  Postoperatively, she has been dealing with some hyperglycemia.   -Continue Lantus 15 units daily, NovoLog 5 units 3 times a day + sliding scale insulin.   -Dietitian consulted  Avascular necrosis of right hip s/p right hip arthroplasty -Per ortho -PT OT recommending SNF placement   HTN -Ramipril  HLD -Lipitor  Paroxysmal A fib -Eliquis  Hypothyroidism -Synthroid  CKD stage 3b -Baseline creatinine 1.4 -Stable   COPD -Without exacerbation -Ok with SpO2 >88%    Patient is stable to discharge to SNF from medical standpoint. She will discharge to SNF, she may increase her home insulin to Lantus 15 units daily, NovoLog 5 to 8 units TID  with meals daily. These medications are ordered on discharge navigator.   Antimicrobials:  Anti-infectives (From admission, onward)    Start     Dose/Rate Route Frequency Ordered Stop   09/07/23 0600  ceFAZolin (ANCEF) IVPB 2g/100 mL premix        2 g 200 mL/hr over 30 Minutes Intravenous On call to O.R. 09/06/23 1237 09/07/23 0630   09/06/23 2300  ceFAZolin (ANCEF) IVPB 2g/100 mL premix        2 g 200 mL/hr over 30 Minutes Intravenous  Every 6 hours 09/06/23 2047 09/07/23 0448   09/06/23 1800  vancomycin (VANCOCIN) powder  Status:  Discontinued          As needed 09/06/23 1724 09/06/23 2237        Objective: Vitals:   09/17/23 1155 09/17/23 1406 09/17/23 2131 09/18/23 0517  BP: (!) 145/58 (!) 116/52 (!) 129/53 (!) 143/53  Pulse: 77 76 84 74  Resp: 18 17 17 17   Temp: 98.1 F (36.7 C) 98.4 F (36.9 C) 98.9 F (37.2 C) 98.3 F (36.8 C)  TempSrc: Oral Oral Oral Oral  SpO2: 96% 94% 92% 95%  Weight:      Height:        Intake/Output Summary (Last 24 hours) at 09/18/2023 0908 Last data filed at 09/18/2023 0517 Gross per 24 hour  Intake 265 ml  Output 100 ml  Net 165 ml   Filed Weights   09/06/23 1337 09/07/23 0330  Weight: 49.9 kg 50.2 kg    Examination:  General exam: Appears calm and comfortable  Respiratory system: Respiratory effort normal. No respiratory distress. No conversational dyspnea.  Central nervous system: Alert and oriented.   Data Reviewed: I have personally reviewed following labs and imaging studies  CBC: Recent Labs  Lab 09/12/23 0335 09/13/23 0347  WBC 12.8* 10.4  HGB 10.3* 9.0*  HCT 32.6* 28.0*  MCV 96.2 95.9  PLT 311 319   Basic Metabolic Panel: Recent Labs  Lab 09/11/23 1325 09/12/23 0335 09/13/23 0347 09/14/23 0942  NA 136  137 134* 132*  K 3.5 3.7 3.9 3.7  CL 102 100 99 96*  CO2 24 25 25 25   GLUCOSE 258* 243* 230* 198*  BUN 37* 39* 43* 47*  CREATININE 1.35* 1.16* 1.41* 1.65*  CALCIUM 8.5* 8.4* 7.9* 8.1*   GFR: Estimated Creatinine Clearance: 21.9 mL/min (A) (by C-G formula based on SCr of 1.65 mg/dL (H)). Liver Function Tests: No results for input(s): "AST", "ALT", "ALKPHOS", "BILITOT", "PROT", "ALBUMIN" in the last 168 hours. No results for input(s): "LIPASE", "AMYLASE" in the last 168 hours. No results for input(s): "AMMONIA" in the last 168 hours. Coagulation Profile: No results for input(s): "INR", "PROTIME" in the last 168 hours. Cardiac Enzymes: No  results for input(s): "CKTOTAL", "CKMB", "CKMBINDEX", "TROPONINI" in the last 168 hours. BNP (last 3 results) No results for input(s): "PROBNP" in the last 8760 hours. HbA1C: No results for input(s): "HGBA1C" in the last 72 hours. CBG: Recent Labs  Lab 09/17/23 0731 09/17/23 1148 09/17/23 1523 09/17/23 2114 09/18/23 0715  GLUCAP 179* 182* 111* 222* 116*   Lipid Profile: No results for input(s): "CHOL", "HDL", "LDLCALC", "TRIG", "CHOLHDL", "LDLDIRECT" in the last 72 hours. Thyroid Function Tests: No results for input(s): "TSH", "T4TOTAL", "FREET4", "T3FREE", "THYROIDAB" in the last 72 hours. Anemia Panel: No results for input(s): "VITAMINB12", "FOLATE", "FERRITIN", "TIBC", "IRON", "RETICCTPCT" in the last 72 hours. Sepsis Labs: No results for input(s): "PROCALCITON", "LATICACIDVEN" in the last 168 hours.  No results found for this or any previous visit (from the past 240 hours).    Radiology Studies: No results found.    Scheduled Meds:  apixaban  2.5 mg Oral BID   ascorbic acid  500 mg Oral Q lunch   atorvastatin  80 mg Oral q morning   docusate sodium  100 mg Oral BID   feeding supplement (GLUCERNA SHAKE)  237 mL Oral TID BM   guaiFENesin  600 mg Oral BID   insulin aspart  0-5 Units Subcutaneous QHS   insulin aspart  0-9 Units Subcutaneous TID WC   insulin aspart  5 Units Subcutaneous TID WC   insulin glargine  15 Units Subcutaneous Daily   levothyroxine  75 mcg Oral Q0600   mometasone-formoterol  2 puff Inhalation BID   multivitamin with minerals  1 tablet Oral Daily   pantoprazole  40 mg Oral Daily   ramipril  2.5 mg Oral Daily   senna  1 tablet Oral BID   zinc sulfate (50mg  elemental zinc)  220 mg Oral Q lunch   Continuous Infusions:   LOS: 10 days   Time spent: 20 minutes   Noralee Stain, DO [facility] 09/18/2023, 9:08 AM   Available via Epic secure chat 7am-7pm After these hours, please refer to coverage provider listed on [email or web address]

## 2023-09-18 NOTE — TOC Transition Note (Signed)
 Transition of Care [facility]) - Discharge Note   Patient Details  Name: Ariel Cobb MRN: [id number] Date of Birth: 1946/03/08  [facility] CM/SW Contact:  Howell Rucks, RN Phone Number: 09/18/2023, 10:52 AM   Clinical Narrative:   Dc order to short term rehab/SNF at [facility], pt's sister,  Steward Drone, notified at bedside, PTAR for transport. No further TOC needs identified.     Final next level of care: Skilled Nursing Facility Barriers to Discharge: Barriers Resolved   Patient Goals and CMS Choice Patient states their goals for this hospitalization and ongoing recovery are:: return home CMS Medicare.gov Compare Post Acute Care list provided to:: Patient Represenative (must comment) [name] (Sister)  [phone] (Mobile)) Choice offered to / list presented to : Sibling [place]aria ownership interest in [facility].provided to:: Sibling    Discharge Placement              Patient chooses bed at: [facility], [place] Patient to be transferred to facility by: PTAR Name of family member notified: [name] (Sister)  [phone] [facility]) Patient and family notified of of transfer: 09/18/23  Discharge Plan and Services Additional resources added to the After Visit Summary for                  DME Arranged: N/A DME Agency: NA                  Social Drivers of Health (SDOH) Interventions SDOH Screenings   Food Insecurity: No Food Insecurity (09/07/2023)  Housing: Low Risk  (09/07/2023)  Transportation Needs: No Transportation Needs (09/07/2023)  Utilities: Not At Risk (09/07/2023)  Social Connections: Moderately Isolated (09/07/2023)  Tobacco Use: High Risk (09/06/2023)     Readmission Risk Interventions    09/18/2023   10:50 AM 09/11/2023    2:29 PM  Readmission Risk Prevention Plan  Post Dischage Appt Complete Complete  Medication Screening Complete Complete  Transportation Screening Complete Complete

## 2023-09-18 NOTE — Progress Notes (Signed)
 Physical Therapy Treatment Patient Details Name: Ariel Cobb MRN: [id number] DOB: 1946-03-27 Today's Date: 09/18/2023   History of Present Illness 78 yo female  s/p Conversion of previous surgery to Right total hip arthroplasty, anterior approach. Pt with Type 2 diabetes mellitus with hyperglycemia postoperatively.   PMH: arthritis, HTN, HLD, PAD,  R hip fx-s/p IM nail 2023, s/p removal of hardware 06/13/24, AVN R hip    PT Comments  Pt seen for PT tx with sister present, pt agreeable. Pt reports she's anticipating d/c to SNF rehab shortly but needs to have BM. Pt requires max assist for supine>sit as pt presents with BLE weakness & increased pain with movement. Pt requires max assist for STS & stand pivot bed>recliner with RW & cuing but poor demo of TDWB RLE. PT provided education re: breathing techniques to increase ease of having a BM. Pt left on [facility] with sister in room, call bell in reach, pt without c/o adverse symptoms.    If plan is discharge home, recommend the following: Two people to help with walking and/or transfers;Two people to help with bathing/dressing/bathroom;Assist for transportation   Can travel by private vehicle     No  Equipment Recommendations  Wheelchair (measurements PT);Wheelchair cushion (measurements PT)    Recommendations for Other Services       Precautions / Restrictions Precautions Precautions: Fall Recall of Precautions/Restrictions: Intact Restrictions Weight Bearing Restrictions Per Provider Order: Yes RLE Weight Bearing Per Provider Order: Touchdown weight bearing     Mobility  Bed Mobility Overal bed mobility: Needs Assistance Bed Mobility: Supine to Sit     Supine to sit: HOB elevated, Used rails, Max assist (assistance to move BLE to EOB, use of bed rails, assistance to scoot to sitting EOB)          Transfers Overall transfer level: Needs assistance Equipment used: Rolling walker (2 wheels) Transfers: Sit to/from Stand, Bed  to chair/wheelchair/BSC Sit to Stand: Max assist Stand pivot transfers: Max assist         General transfer comment: STS from EOB with cuing re: hand placement, assistance & cuing to pivot to [facility] on R during stand pivot with RW. PT attempted to show pt squat pivot transfer but pt with decreased ability to lean anteriorly for proper hand placement, anticipate this is likely due to fear of falling. Pt also requires max cuing throughout stand pivot for TDWB RLE with poor return demo.    Ambulation/Gait                   Stairs             Wheelchair Mobility     Tilt Bed    Modified Rankin (Stroke Patients Only)       Balance Overall balance assessment: Needs assistance Sitting-balance support: Feet supported, No upper extremity supported Sitting balance-Leahy Scale: Good     Standing balance support: Reliant on assistive device for balance, During functional activity, Bilateral upper extremity supported Standing balance-Leahy Scale: Poor                              Communication Communication Communication: No apparent difficulties  Cognition Arousal: Alert Behavior During Therapy: WFL for tasks assessed/performed   PT - Cognitive impairments: No apparent impairments                         Following commands: Intact  Cueing Cueing Techniques: Verbal cues  Exercises      General Comments        Pertinent Vitals/Pain Pain Assessment Pain Assessment: Faces Faces Pain Scale: Hurts even more Pain Location: R hip with movement Pain Descriptors / Indicators: Sore, Guarding, Grimacing, Discomfort Pain Intervention(s): Monitored during session, Limited activity within patient's tolerance, Repositioned    Home Living                          Prior Function            PT Goals (current goals can now be found in the care plan section) Acute Rehab PT Goals Patient Stated Goal: home! PT Goal Formulation: With  patient/family Time For Goal Achievement: 09/21/23 Potential to Achieve Goals: Fair Progress towards PT goals: Progressing toward goals    Frequency    Min 5X/week      PT Plan      Co-evaluation              AM-PAC PT "6 Clicks" Mobility   Outcome Measure  Help needed turning from your back to your side while in a flat bed without using bedrails?: A Lot Help needed moving from lying on your back to sitting on the side of a flat bed without using bedrails?: Total Help needed moving to and from a bed to a chair (including a wheelchair)?: Total Help needed standing up from a chair using your arms (e.g., wheelchair or bedside chair)?: A Lot Help needed to walk in hospital room?: Total Help needed climbing 3-5 steps with a railing? : Total 6 Click Score: 8    End of Session Equipment Utilized During Treatment: Gait belt Activity Tolerance: Patient tolerated treatment well (limited 2/2 need to toilet) Patient left: with call bell/phone within reach;with family/visitor present (on [facility]) Nurse Communication:  (pt on BSC with call bell in reach) PT Visit Diagnosis: Other abnormalities of gait and mobility (R26.89);Pain;Muscle weakness (generalized) (M62.81);Difficulty in walking, not elsewhere classified (R26.2);Unsteadiness on feet (R26.81) Pain - Right/Left: Right Pain - part of body: Leg     Time: 1125-1140 PT Time Calculation (min) (ACUTE ONLY): 15 min  Charges:    $Therapeutic Activity: 8-22 mins PT General Charges $$ ACUTE PT VISIT: 1 Visit                     Aleda Grana, PT, DPT 09/18/23, 11:47 AM   Sandi Mariscal 09/18/2023, 11:45 AM

## 2023-09-19 LAB — GLUCOSE, CAPILLARY: Glucose-Capillary: 182 mg/dL — ABNORMAL HIGH (ref 70–99)

## 2023-09-20 DIAGNOSIS — J449 Chronic obstructive pulmonary disease, unspecified: Secondary | ICD-10-CM | POA: Diagnosis not present

## 2023-09-20 DIAGNOSIS — Z9889 Other specified postprocedural states: Secondary | ICD-10-CM | POA: Diagnosis not present

## 2023-09-20 DIAGNOSIS — I4819 Other persistent atrial fibrillation: Secondary | ICD-10-CM | POA: Diagnosis not present

## 2023-09-20 DIAGNOSIS — Z96641 Presence of right artificial hip joint: Secondary | ICD-10-CM | POA: Diagnosis not present

## 2023-09-22 DIAGNOSIS — R059 Cough, unspecified: Secondary | ICD-10-CM | POA: Diagnosis not present

## 2023-09-25 DIAGNOSIS — R051 Acute cough: Secondary | ICD-10-CM | POA: Diagnosis not present

## 2023-09-25 DIAGNOSIS — R339 Retention of urine, unspecified: Secondary | ICD-10-CM | POA: Diagnosis not present

## 2023-09-25 DIAGNOSIS — J9601 Acute respiratory failure with hypoxia: Secondary | ICD-10-CM | POA: Diagnosis not present

## 2023-09-26 DIAGNOSIS — Z471 Aftercare following joint replacement surgery: Secondary | ICD-10-CM | POA: Diagnosis not present

## 2023-09-26 DIAGNOSIS — Z96641 Presence of right artificial hip joint: Secondary | ICD-10-CM | POA: Diagnosis not present

## 2023-09-27 DIAGNOSIS — L89156 Pressure-induced deep tissue damage of sacral region: Secondary | ICD-10-CM | POA: Diagnosis not present

## 2023-10-02 DIAGNOSIS — R1314 Dysphagia, pharyngoesophageal phase: Secondary | ICD-10-CM | POA: Diagnosis not present

## 2023-10-02 DIAGNOSIS — F05 Delirium due to known physiological condition: Secondary | ICD-10-CM | POA: Diagnosis not present

## 2023-10-04 DIAGNOSIS — L89152 Pressure ulcer of sacral region, stage 2: Secondary | ICD-10-CM | POA: Diagnosis not present

## 2023-10-06 DIAGNOSIS — N179 Acute kidney failure, unspecified: Secondary | ICD-10-CM | POA: Diagnosis not present

## 2023-10-06 DIAGNOSIS — J449 Chronic obstructive pulmonary disease, unspecified: Secondary | ICD-10-CM | POA: Diagnosis not present

## 2023-10-06 DIAGNOSIS — R1314 Dysphagia, pharyngoesophageal phase: Secondary | ICD-10-CM | POA: Diagnosis not present

## 2023-10-26 DEATH — deceased
